# Patient Record
Sex: Male | Born: 1965 | Race: White | Hispanic: No | Marital: Single | Smoking: Former smoker
Health system: Southern US, Community
[De-identification: ages and names within clinical notes are randomized; demographics above are authoritative.]

## PROBLEM LIST (undated history)

## (undated) DIAGNOSIS — E119 Type 2 diabetes mellitus without complications: Secondary | ICD-10-CM

## (undated) DIAGNOSIS — I10 Essential (primary) hypertension: Secondary | ICD-10-CM

## (undated) DIAGNOSIS — I509 Heart failure, unspecified: Secondary | ICD-10-CM

## (undated) DIAGNOSIS — G629 Polyneuropathy, unspecified: Secondary | ICD-10-CM

## (undated) DIAGNOSIS — I2510 Atherosclerotic heart disease of native coronary artery without angina pectoris: Secondary | ICD-10-CM

## (undated) DIAGNOSIS — K219 Gastro-esophageal reflux disease without esophagitis: Secondary | ICD-10-CM

## (undated) HISTORY — DX: Type 2 diabetes mellitus without complications: E11.9

## (undated) HISTORY — DX: Atherosclerotic heart disease of native coronary artery without angina pectoris: I25.10

## (undated) HISTORY — DX: Heart failure, unspecified: I50.9

## (undated) HISTORY — DX: Essential (primary) hypertension: I10

---

## 2022-02-05 ENCOUNTER — Inpatient Hospital Stay
Admission: EM | Admit: 2022-02-05 | Discharge: 2022-03-01 | DRG: 287 | Disposition: A | Discharge status: Home / Self Care | Payer: Medicaid Other | Attending: Osteopathic Medicine | Admitting: Osteopathic Medicine

## 2022-02-05 ENCOUNTER — Inpatient Hospital Stay: Payer: Medicaid Other

## 2022-02-05 ENCOUNTER — Emergency Department: Payer: Medicaid Other

## 2022-02-05 ENCOUNTER — Encounter: Payer: Self-pay | Admitting: Internal Medicine

## 2022-02-05 ENCOUNTER — Other Ambulatory Visit: Payer: Self-pay

## 2022-02-05 DIAGNOSIS — Z597 Insufficient social insurance and welfare support: Secondary | ICD-10-CM

## 2022-02-05 DIAGNOSIS — E1142 Type 2 diabetes mellitus with diabetic polyneuropathy: Secondary | ICD-10-CM | POA: Diagnosis present

## 2022-02-05 DIAGNOSIS — I255 Ischemic cardiomyopathy: Secondary | ICD-10-CM | POA: Diagnosis present

## 2022-02-05 DIAGNOSIS — L97919 Non-pressure chronic ulcer of unspecified part of right lower leg with unspecified severity: Secondary | ICD-10-CM | POA: Diagnosis present

## 2022-02-05 DIAGNOSIS — E876 Hypokalemia: Secondary | ICD-10-CM | POA: Diagnosis not present

## 2022-02-05 DIAGNOSIS — Z8249 Family history of ischemic heart disease and other diseases of the circulatory system: Secondary | ICD-10-CM | POA: Diagnosis not present

## 2022-02-05 DIAGNOSIS — Z6841 Body Mass Index (BMI) 40.0 and over, adult: Secondary | ICD-10-CM | POA: Diagnosis not present

## 2022-02-05 DIAGNOSIS — J9811 Atelectasis: Secondary | ICD-10-CM | POA: Diagnosis present

## 2022-02-05 DIAGNOSIS — I5043 Acute on chronic combined systolic (congestive) and diastolic (congestive) heart failure: Secondary | ICD-10-CM | POA: Diagnosis not present

## 2022-02-05 DIAGNOSIS — F40240 Claustrophobia: Secondary | ICD-10-CM | POA: Diagnosis present

## 2022-02-05 DIAGNOSIS — I959 Hypotension, unspecified: Secondary | ICD-10-CM | POA: Diagnosis not present

## 2022-02-05 DIAGNOSIS — I4892 Unspecified atrial flutter: Secondary | ICD-10-CM | POA: Diagnosis not present

## 2022-02-05 DIAGNOSIS — E1165 Type 2 diabetes mellitus with hyperglycemia: Secondary | ICD-10-CM | POA: Diagnosis present

## 2022-02-05 DIAGNOSIS — E785 Hyperlipidemia, unspecified: Secondary | ICD-10-CM | POA: Diagnosis present

## 2022-02-05 DIAGNOSIS — R778 Other specified abnormalities of plasma proteins: Secondary | ICD-10-CM | POA: Diagnosis not present

## 2022-02-05 DIAGNOSIS — R9431 Abnormal electrocardiogram [ECG] [EKG]: Secondary | ICD-10-CM | POA: Diagnosis not present

## 2022-02-05 DIAGNOSIS — I5021 Acute systolic (congestive) heart failure: Secondary | ICD-10-CM

## 2022-02-05 DIAGNOSIS — R52 Pain, unspecified: Secondary | ICD-10-CM

## 2022-02-05 DIAGNOSIS — I739 Peripheral vascular disease, unspecified: Secondary | ICD-10-CM | POA: Diagnosis present

## 2022-02-05 DIAGNOSIS — I2510 Atherosclerotic heart disease of native coronary artery without angina pectoris: Secondary | ICD-10-CM

## 2022-02-05 DIAGNOSIS — I70202 Unspecified atherosclerosis of native arteries of extremities, left leg: Secondary | ICD-10-CM | POA: Diagnosis present

## 2022-02-05 DIAGNOSIS — E1151 Type 2 diabetes mellitus with diabetic peripheral angiopathy without gangrene: Secondary | ICD-10-CM | POA: Diagnosis present

## 2022-02-05 DIAGNOSIS — I872 Venous insufficiency (chronic) (peripheral): Secondary | ICD-10-CM | POA: Diagnosis present

## 2022-02-05 DIAGNOSIS — I429 Cardiomyopathy, unspecified: Secondary | ICD-10-CM | POA: Diagnosis not present

## 2022-02-05 DIAGNOSIS — L97929 Non-pressure chronic ulcer of unspecified part of left lower leg with unspecified severity: Secondary | ICD-10-CM | POA: Diagnosis not present

## 2022-02-05 DIAGNOSIS — I70249 Atherosclerosis of native arteries of left leg with ulceration of unspecified site: Secondary | ICD-10-CM | POA: Diagnosis not present

## 2022-02-05 DIAGNOSIS — Z79899 Other long term (current) drug therapy: Secondary | ICD-10-CM

## 2022-02-05 DIAGNOSIS — R0902 Hypoxemia: Secondary | ICD-10-CM | POA: Diagnosis not present

## 2022-02-05 DIAGNOSIS — R739 Hyperglycemia, unspecified: Secondary | ICD-10-CM

## 2022-02-05 DIAGNOSIS — I248 Other forms of acute ischemic heart disease: Secondary | ICD-10-CM | POA: Diagnosis not present

## 2022-02-05 DIAGNOSIS — I773 Arterial fibromuscular dysplasia: Secondary | ICD-10-CM | POA: Diagnosis not present

## 2022-02-05 DIAGNOSIS — I509 Heart failure, unspecified: Secondary | ICD-10-CM

## 2022-02-05 DIAGNOSIS — I70239 Atherosclerosis of native arteries of right leg with ulceration of unspecified site: Secondary | ICD-10-CM | POA: Diagnosis not present

## 2022-02-05 DIAGNOSIS — R238 Other skin changes: Secondary | ICD-10-CM | POA: Diagnosis present

## 2022-02-05 DIAGNOSIS — Z7189 Other specified counseling: Secondary | ICD-10-CM | POA: Diagnosis not present

## 2022-02-05 DIAGNOSIS — I25118 Atherosclerotic heart disease of native coronary artery with other forms of angina pectoris: Secondary | ICD-10-CM | POA: Diagnosis not present

## 2022-02-05 DIAGNOSIS — E873 Alkalosis: Secondary | ICD-10-CM | POA: Diagnosis not present

## 2022-02-05 DIAGNOSIS — F17200 Nicotine dependence, unspecified, uncomplicated: Secondary | ICD-10-CM

## 2022-02-05 DIAGNOSIS — I420 Dilated cardiomyopathy: Secondary | ICD-10-CM | POA: Diagnosis not present

## 2022-02-05 DIAGNOSIS — Z87891 Personal history of nicotine dependence: Secondary | ICD-10-CM

## 2022-02-05 DIAGNOSIS — E119 Type 2 diabetes mellitus without complications: Secondary | ICD-10-CM | POA: Diagnosis present

## 2022-02-05 DIAGNOSIS — I110 Hypertensive heart disease with heart failure: Secondary | ICD-10-CM | POA: Diagnosis not present

## 2022-02-05 LAB — CBC WITH DIFFERENTIAL/PLATELET
Abs Immature Granulocytes: 0.02 10*3/uL (ref 0.00–0.07)
Abs Immature Granulocytes: 0.02 10*3/uL (ref 0.00–0.07)
Basophils Absolute: 0.1 10*3/uL (ref 0.0–0.1)
Basophils Absolute: 0 10*3/uL (ref 0.0–0.1)
Basophils Relative: 1 %
Basophils Relative: 1 %
Eosinophils Absolute: 0.2 10*3/uL (ref 0.0–0.5)
Eosinophils Absolute: 0.2 10*3/uL (ref 0.0–0.5)
Eosinophils Relative: 3 %
Eosinophils Relative: 3 %
HCT: 43.2 % (ref 39.0–52.0)
HCT: 41.6 % (ref 39.0–52.0)
Hemoglobin: 13.8 g/dL (ref 13.0–17.0)
Hemoglobin: 13.3 g/dL (ref 13.0–17.0)
Immature Granulocytes: 0 %
Immature Granulocytes: 0 %
Lymphocytes Relative: 7 %
Lymphocytes Relative: 9 %
Lymphs Abs: 0.5 10*3/uL — ABNORMAL LOW (ref 0.7–4.0)
Lymphs Abs: 0.5 10*3/uL — ABNORMAL LOW (ref 0.7–4.0)
MCH: 31.9 pg (ref 26.0–34.0)
MCH: 31 pg (ref 26.0–34.0)
MCHC: 31.9 g/dL (ref 30.0–36.0)
MCHC: 32 g/dL (ref 30.0–36.0)
MCV: 100 fL (ref 80.0–100.0)
MCV: 97 fL (ref 80.0–100.0)
Monocytes Absolute: 0.4 10*3/uL (ref 0.1–1.0)
Monocytes Absolute: 0.4 10*3/uL (ref 0.1–1.0)
Monocytes Relative: 6 %
Monocytes Relative: 6 %
Neutro Abs: 5.9 10*3/uL (ref 1.7–7.7)
Neutro Abs: 5.1 10*3/uL (ref 1.7–7.7)
Neutrophils Relative %: 83 %
Neutrophils Relative %: 81 %
Platelets: 174 10*3/uL (ref 150–400)
Platelets: 165 10*3/uL (ref 150–400)
RBC: 4.32 MIL/uL (ref 4.22–5.81)
RBC: 4.29 MIL/uL (ref 4.22–5.81)
RDW: 16.8 % — ABNORMAL HIGH (ref 11.5–15.5)
RDW: 16.7 % — ABNORMAL HIGH (ref 11.5–15.5)
WBC: 7 10*3/uL (ref 4.0–10.5)
WBC: 6.3 10*3/uL (ref 4.0–10.5)
nRBC: 0 % (ref 0.0–0.2)
nRBC: 0 % (ref 0.0–0.2)

## 2022-02-05 LAB — BRAIN NATRIURETIC PEPTIDE: B Natriuretic Peptide: 919.6 pg/mL — ABNORMAL HIGH (ref 0.0–100.0)

## 2022-02-05 LAB — URINE DRUG SCREEN, QUALITATIVE (ARMC ONLY)
Amphetamines, Ur Screen: NOT DETECTED
Barbiturates, Ur Screen: NOT DETECTED
Benzodiazepine, Ur Scrn: POSITIVE — AB
Cannabinoid 50 Ng, Ur ~~LOC~~: NOT DETECTED
Cocaine Metabolite,Ur ~~LOC~~: NOT DETECTED
MDMA (Ecstasy)Ur Screen: NOT DETECTED
Methadone Scn, Ur: NOT DETECTED
Opiate, Ur Screen: NOT DETECTED
Phencyclidine (PCP) Ur S: NOT DETECTED
Tricyclic, Ur Screen: NOT DETECTED

## 2022-02-05 LAB — COMPREHENSIVE METABOLIC PANEL
ALT: 23 U/L (ref 0–44)
AST: 24 U/L (ref 15–41)
Albumin: 3.4 g/dL — ABNORMAL LOW (ref 3.5–5.0)
Alkaline Phosphatase: 69 U/L (ref 38–126)
Anion gap: 7 (ref 5–15)
BUN: 14 mg/dL (ref 6–20)
CO2: 31 mmol/L (ref 22–32)
Calcium: 8.9 mg/dL (ref 8.9–10.3)
Chloride: 102 mmol/L (ref 98–111)
Creatinine, Ser: 1.01 mg/dL (ref 0.61–1.24)
GFR, Estimated: >60 mL/min (ref >60)
Glucose, Bld: 195 mg/dL — ABNORMAL HIGH (ref 70–99)
Potassium: 3.8 mmol/L (ref 3.5–5.1)
Sodium: 140 mmol/L (ref 135–145)
Total Bilirubin: 0.9 mg/dL (ref 0.3–1.2)
Total Protein: 7.4 g/dL (ref 6.5–8.1)

## 2022-02-05 LAB — BASIC METABOLIC PANEL
Anion gap: 9 (ref 5–15)
BUN: 16 mg/dL (ref 6–20)
CO2: 30 mmol/L (ref 22–32)
Calcium: 8.9 mg/dL (ref 8.9–10.3)
Chloride: 100 mmol/L (ref 98–111)
Creatinine, Ser: 0.96 mg/dL (ref 0.61–1.24)
GFR, Estimated: >60 mL/min (ref >60)
Glucose, Bld: 159 mg/dL — ABNORMAL HIGH (ref 70–99)
Potassium: 4.2 mmol/L (ref 3.5–5.1)
Sodium: 139 mmol/L (ref 135–145)

## 2022-02-05 LAB — MAGNESIUM: Magnesium: 1.8 mg/dL (ref 1.7–2.4)

## 2022-02-05 LAB — TSH: TSH: 9.011 u[IU]/mL — ABNORMAL HIGH (ref 0.350–4.500)

## 2022-02-05 LAB — TROPONIN I (HIGH SENSITIVITY)
Troponin I (High Sensitivity): 370 ng/L (ref <18)
Troponin I (High Sensitivity): 367 ng/L (ref <18)

## 2022-02-05 MED ORDER — ENOXAPARIN SODIUM 80 MG/0.8ML IJ SOSY
0.5000 mg/kg | PREFILLED_SYRINGE | INTRAMUSCULAR | Status: DC
  Administered 2022-02-05 – 2022-02-11 (×7): 72.5 mg via SUBCUTANEOUS
  Filled 2022-02-05 (×5): qty 0.8
  Filled 2022-02-05: qty 0.72
  Filled 2022-02-05: qty 0.8

## 2022-02-05 MED ORDER — ASPIRIN 81 MG PO TBEC
81.0000 mg | DELAYED_RELEASE_TABLET | Freq: Every day | ORAL | Status: DC
  Administered 2022-02-05 – 2022-03-01 (×24): 81 mg via ORAL
  Filled 2022-02-05 (×24): qty 1

## 2022-02-05 MED ORDER — FUROSEMIDE 10 MG/ML IJ SOLN
60.0000 mg | Freq: Two times a day (BID) | INTRAMUSCULAR | Status: DC
  Administered 2022-02-05: 60 mg via INTRAVENOUS
  Filled 2022-02-05: qty 8
  Filled 2022-02-05: qty 6

## 2022-02-05 MED ORDER — FUROSEMIDE 10 MG/ML IJ SOLN
40.0000 mg | Freq: Two times a day (BID) | INTRAMUSCULAR | Status: DC
  Administered 2022-02-05: 40 mg via INTRAVENOUS
  Filled 2022-02-05: qty 4

## 2022-02-05 MED ORDER — NICOTINE POLACRILEX 2 MG MT GUM
2.0000 mg | CHEWING_GUM | Freq: Once | OROMUCOSAL | Status: AC
  Administered 2022-02-06: 2 mg via ORAL
  Filled 2022-02-05: qty 1

## 2022-02-05 MED ORDER — FUROSEMIDE 10 MG/ML IJ SOLN
20.0000 mg | Freq: Once | INTRAMUSCULAR | Status: AC
  Administered 2022-02-05: 20 mg via INTRAVENOUS
  Filled 2022-02-05: qty 4

## 2022-02-05 MED ORDER — OXYCODONE-ACETAMINOPHEN 5-325 MG PO TABS
1.0000 | ORAL_TABLET | Freq: Once | ORAL | Status: AC
  Administered 2022-02-05: 1 via ORAL
  Filled 2022-02-05: qty 1

## 2022-02-05 NOTE — ED Notes (Signed)
Troponin 370 per lab.  EDP [place] notified.

## 2022-02-05 NOTE — ED Provider Notes (Signed)
[facility] Provider Note    Event Date/Time   First MD Initiated Contact with Patient 02/05/22 0207     (approximate)   History   Leg Swelling   HPI  Lawrence Murray is a 56 y.o. male who has not seen a physician since he was a teenager who presents for evaluation of bilateral leg swelling, shortness of breath and abdominal distention.  Patient reports that he has health has been getting progressively worse since November 2022.  He has had progressively worsening swelling of both legs with blisters and oozing of fluid from them.  Progressively worsening which is now severe distention of his abdomen.  Has had orthopnea.  Over the last several months patient has been sleeping leaning forward on his chair due to the severity of his shortness of breath.  Patient's son has been begging him to come and get checked out which is the reason why he is here today.  He reports that when he cannot breathe he has heaviness in his chest but denies any current chest pain.  He has a family history of congestive heart failure    PMH None  Physical Exam   Triage Vital Signs: ED Triage Vitals  Enc Vitals Group     BP 02/05/22 0214 (!) 143/89     Pulse Rate 02/05/22 0214 (!) 117     Resp 02/05/22 0214 (!) 21     Temp 02/05/22 0214 98.3 F (36.8 C)     Temp Source 02/05/22 0214 Oral     SpO2 02/05/22 0214 96 %     Weight 02/05/22 0216 (!) 320 lb (145.2 kg)     Height 02/05/22 0216 '5\' 10"'$  (1.778 m)     Head Circumference --      Peak Flow --      Pain Score 02/05/22 0314 5     Pain Loc --      Pain Edu? --      Excl. in [place]? --     Most recent vital signs: Vitals:   02/05/22 0230 02/05/22 0314  BP: 120/87 109/75  Pulse: (!) 110 (!) 107  Resp: (!) 24 20  Temp:    SpO2: 97% 97%     Constitutional: Alert and oriented.  HEENT:      Head: Normocephalic and atraumatic.         Eyes: Conjunctivae are normal. Sclera is non-icteric.       Mouth/Throat: Mucous  membranes are moist.       Neck: Supple with no signs of meningismus. Cardiovascular: Regular rhythm with tachycardic rate respiratory: Tachypneic but no hypoxic with crackles bilateral Gastrointestinal: Severely distended abdomen with no tenderness  musculoskeletal: Severe pitting edema bilaterally with blisters, feet are cool to the touch with no erythema or warmth Neurologic: Normal speech and language. Face is symmetric. Moving all extremities. No gross focal neurologic deficits are appreciated. Skin: Skin is warm, dry and intact. No rash noted. Psychiatric: Mood and affect are normal. Speech and behavior are normal.  ED Results / Procedures / Treatments   Labs (all labs ordered are listed, but only abnormal results are displayed) Labs Reviewed  CBC WITH DIFFERENTIAL/PLATELET - Abnormal; Notable for the following components:      Result Value   RDW 16.8 (*)    Lymphs Abs 0.5 (*)    All other components within normal limits  COMPREHENSIVE METABOLIC PANEL - Abnormal; Notable for the following components:   Glucose, Bld 195 (*)  Albumin 3.4 (*)    All other components within normal limits  BRAIN NATRIURETIC PEPTIDE - Abnormal; Notable for the following components:   B Natriuretic Peptide 919.6 (*)    All other components within normal limits  TROPONIN I (HIGH SENSITIVITY) - Abnormal; Notable for the following components:   Troponin I (High Sensitivity) 370 (*)    All other components within normal limits     EKG  ED ECG REPORT I, Rudene Re, the attending physician, personally viewed and interpreted this ECG.  Atrial flutter with a rate of 114, no ST elevations or depressions.  RADIOLOGY I, Rudene Re, attending MD, have personally viewed and interpreted the images obtained during this visit as below:  X-ray shows pleural effusion and edema   ___________________________________________________ Interpretation by Radiologist:  DG Chest Portable 1  View  Result Date: 02/05/2022 CLINICAL DATA:  Shortness of breath. EXAM: PORTABLE CHEST 1 VIEW COMPARISON:  None Available. FINDINGS: Small bilateral pleural effusions with bibasilar atelectasis or infiltrate. No pneumothorax. Top-normal cardiac size. No acute osseous pathology. IMPRESSION: Small bilateral pleural effusions with bibasilar atelectasis or infiltrate. Electronically Signed   By: Anner Crete M.D.   On: 02/05/2022 02:55       PROCEDURES:  Critical Care performed: Yes, see critical care procedure note(s)  .Critical Care  Performed by: Rudene Re, MD Authorized by: Rudene Re, MD   Critical care provider statement:    Critical care time (minutes):  40   Critical care time was exclusive of:  Separately billable procedures and treating other patients   Critical care was necessary to treat or prevent imminent or life-threatening deterioration of the following conditions:  Respiratory failure, circulatory failure, cardiac failure and shock   Critical care was time spent personally by me on the following activities:  Development of treatment plan with patient or surrogate, discussions with consultants, evaluation of patient's response to treatment, examination of patient, ordering and review of laboratory studies, ordering and review of radiographic studies, ordering and performing treatments and interventions, pulse oximetry, re-evaluation of patient's condition and review of old charts   I assumed direction of critical care for this patient from another provider in my specialty: no     Care discussed with: admitting provider       IMPRESSION / MDM / [place] / ED COURSE  I reviewed the triage vital signs and the nursing notes.  56 y.o. male who has not seen a physician since he was a teenager who presents for evaluation of bilateral leg swelling, shortness of breath and abdominal distention.  Patient arrives severely volume overloaded with bilateral  crackles, severely abdominal distention, severe bilateral pitting edema.  While sitting up patient is not requiring any oxygen  Ddx: Congestive heart failure versus kidney disease versus cardiorenal syndrome versus cirrhosis    Plan: EKG, troponin, BNP, BMP, chest x-ray.  Patient placed on telemetry for monitoring of cardiorespiratory status   MEDICATIONS GIVEN IN ED: Medications  oxyCODONE-acetaminophen (PERCOCET/ROXICET) 5-325 MG per tablet 1 tablet (has no administration in time range)  furosemide (LASIX) injection 20 mg (20 mg Intravenous Given 02/05/22 0316)     ED COURSE: Presentation concerning for congestive heart failure with pleural effusions, pulmonary edema, BNP of 919.  Patient was started on IV Lasix.  Troponin of 370 which is expected since patient symptoms have been ongoing since November.  There is no signs of ischemia and he denies any chest pain at this time.  This is most likely demand.  Kidney  function is normal.  Patient also has a glucose of 195 most likely from undiagnosed diabetes.  EKG showing atrial flutter with a rate of 114.  Hospitalist service was consulted and after discussion have accepted patient to their service.   Consults: Hospitalist   EMR reviewed none    FINAL CLINICAL IMPRESSION(S) / ED DIAGNOSES   Final diagnoses:  New onset of congestive heart failure ([place])  Demand ischemia ([place])  Atrial flutter, unspecified type ([place])  Hyperglycemia     Rx / DC Orders   ED Discharge Orders     None        Note:  This document was prepared using Dragon voice recognition software and may include unintentional dictation errors.   Please note:  Patient was evaluated in Emergency Department today for the symptoms described in the history of present illness. Patient was evaluated in the context of the global COVID-19 pandemic, which necessitated consideration that the patient might be at risk for infection with the SARS-CoV-2 virus that causes  COVID-19. Institutional protocols and algorithms that pertain to the evaluation of patients at risk for COVID-19 are in a state of rapid change based on information released by regulatory bodies including the CDC and federal and state organizations. These policies and algorithms were followed during the patient's care in the ED.  Some ED evaluations and interventions may be delayed as a result of limited staffing during the pandemic.       Alfred Levins, [place], MD 02/05/22 [phone]

## 2022-02-05 NOTE — ED Triage Notes (Signed)
To room 5 via [facility] from home with c/o Edema and weeping blisters to bilateral legs and abd. Abdominal distention. Ongoing since November, pt states blisters became worse today. Pt reports not seeking medical care for years despite condition; Denies chest pain. States he sleeps upright in a chair, leaning on another chair in order to breath adequately for sleep.  Pt has difficulty being detailed in specifics regarding sx and onset/timeframe.

## 2022-02-05 NOTE — Progress Notes (Signed)
Brief hospitalist update note.  This is a nonbillable note.  Please see same-day H&P for full billable details.  Briefly, this is a 56 year old male who has not seen a doctor in many years who presents for progressive shortness of breath associated with lower extremity edema.  Bilateral lower extremities are markedly edematous, cold to touch but with dopplerable pulses.  Presentation is consistent with marked fluid overload.  Work-up in progress.  Diuresing aggressively.  Ensure net negative fluid status.  WOC consult for bilateral lower extremity wounds.  Low suspicion for infectious process. Remainder of care per HPI  Ralene Muskrat MD  No charge

## 2022-02-05 NOTE — ED Notes (Signed)
RN attempted to give pt IV lasix. RN flushed IV prior and IV blew.

## 2022-02-05 NOTE — H&P (Signed)
History and Physical    Lawrence Murray HAL:[id number] DOB: 15-Jun-1966 DOA: 02/05/2022  PCP: Pcp, No  Patient coming from: Home.  Chief Complaint: Shortness of breath.  HPI: Lawrence Murray is a 56 y.o. male with no significant past medical history who has not been to a physician since childhood presents to the ER because of increasing peripheral edema shortness of breath.  At times patient also has chest pressure.  Patient states his month since he laid flat on the bed.  He has been having so much edema that he has started blistering in his lower extremities.  Denies any fever chills productive cough.  ED Course: In the ER patient was tachycardic.  On exam patient has significant peripheral edema blistering of the lower extremities skin and cool peripheries but has pulses.  Labs show elevated high sensitive troponin but flat and BNP of 900.  Chest x-ray shows features concerning for CHF.  Patient is tachycardic and concern for atrial flutter.  Patient was given Lasix admitted for acute CHF new onset.  Review of Systems: As per HPI, rest all negative.   History reviewed. No pertinent past medical history.  History reviewed. No pertinent surgical history.   reports that he has quit smoking. His smoking use included cigarettes. He has never used smokeless tobacco. He reports that he does not drink alcohol and does not use drugs.  Not on File  Family History  Problem Relation Age of Onset   Congestive Heart Failure Father     Prior to Admission medications   Not on File    Physical Exam: Constitutional: Moderately built and nourished. Vitals:   02/05/22 0214 02/05/22 0216 02/05/22 0230 02/05/22 0314  BP: (!) 143/89  120/87 109/75  Pulse: (!) 117  (!) 110 (!) 107  Resp: (!) 21  (!) 24 20  Temp: 98.3 F (36.8 C)     TempSrc: Oral     SpO2: 96%  97% 97%  Weight:  (!) 145.2 kg    Height:  '5\' 10"'$  (1.778 m)     Eyes: Anicteric no pallor. ENMT: No discharge from the ears eyes nose  and mouth. Neck: No mass felt.  No neck rigidity. Respiratory: No rhonchi or crepitations. Cardiovascular: S1-S2 heard. Abdomen: Soft nontender bowel sound present. Musculoskeletal: Bilateral lower extremity edema present.  Pulses are dopplerable. Skin: Erythema and blistering of the skin. Neurologic: Alert awake oriented time place and person.  Moves all extremities. Psychiatric: Appears normal.  Normal affect.   Labs on Admission: I have personally reviewed following labs and imaging studies  CBC: Recent Labs  Lab 02/05/22 0218  WBC 7.0  NEUTROABS 5.9  HGB 13.8  HCT 43.2  MCV 100.0  PLT 735   Basic Metabolic Panel: Recent Labs  Lab 02/05/22 0218  NA 140  K 3.8  CL 102  CO2 31  GLUCOSE 195*  BUN 14  CREATININE 1.01  CALCIUM 8.9   GFR: Estimated Creatinine Clearance: 120.5 mL/min (by C-G formula based on SCr of 1.01 mg/dL). Liver Function Tests: Recent Labs  Lab 02/05/22 0218  AST 24  ALT 23  ALKPHOS 69  BILITOT 0.9  PROT 7.4  ALBUMIN 3.4*   No results for input(s): "LIPASE", "AMYLASE" in the last 168 hours. No results for input(s): "AMMONIA" in the last 168 hours. Coagulation Profile: No results for input(s): "INR", "PROTIME" in the last 168 hours. Cardiac Enzymes: No results for input(s): "CKTOTAL", "CKMB", "CKMBINDEX", "TROPONINI" in the last 168 hours. BNP (last 3 results) No  results for input(s): "PROBNP" in the last 8760 hours. HbA1C: No results for input(s): "HGBA1C" in the last 72 hours. CBG: No results for input(s): "GLUCAP" in the last 168 hours. Lipid Profile: No results for input(s): "CHOL", "HDL", "LDLCALC", "TRIG", "CHOLHDL", "LDLDIRECT" in the last 72 hours. Thyroid Function Tests: No results for input(s): "TSH", "T4TOTAL", "FREET4", "T3FREE", "THYROIDAB" in the last 72 hours. Anemia Panel: No results for input(s): "VITAMINB12", "FOLATE", "FERRITIN", "TIBC", "IRON", "RETICCTPCT" in the last 72 hours. Urine analysis: No results found  for: "COLORURINE", "APPEARANCEUR", "LABSPEC", "PHURINE", "GLUCOSEU", "HGBUR", "BILIRUBINUR", "KETONESUR", "PROTEINUR", "UROBILINOGEN", "NITRITE", "LEUKOCYTESUR" Sepsis Labs: '@LABRCNTIP'$ (procalcitonin:4,lacticidven:4) )No results found for this or any previous visit (from the past 240 hour(s)).   Radiological Exams on Admission: DG Chest Portable 1 View  Result Date: 02/05/2022 CLINICAL DATA:  Shortness of breath. EXAM: PORTABLE CHEST 1 VIEW COMPARISON:  None Available. FINDINGS: Small bilateral pleural effusions with bibasilar atelectasis or infiltrate. No pneumothorax. Top-normal cardiac size. No acute osseous pathology. IMPRESSION: Small bilateral pleural effusions with bibasilar atelectasis or infiltrate. Electronically Signed   By: Anner Crete M.D.   On: 02/05/2022 02:55    EKG: Independently reviewed.  Tachycardia.  Rhythm not clear.  Assessment/Plan Principal Problem:   Acute CHF (congestive heart failure) (HCC) Active Problems:   Hyperglycemia    Acute CHF -patient's symptoms are concerning for CHF.  Will check 2D echo to assess LV.  We will keep patient on Lasix 40 mg IV every 12 may need higher dose based on response.  Closely follow intake output metabolic panel daily weights. Elevated troponin likely from CHF.  Remains flat.  Follow 2D echo we will keep patient on aspirin. Hyperglycemia we will check hemoglobin A1c. Tachycardia with concern for atrial flutter.  We will repeat another EKG to assess the rhythm.  Monitor in telemetry. Bilateral lower extremity erythema and blistering of the skin likely from edema.  Patient does have cool peripheries but pulses are dopplerable.  We will check venous Dopplers and ABI.  Wound team consult.  Since patient has acute CHF new onset will need close monitoring inpatient status.   DVT prophylaxis: Lovenox. Code Status: Full code. Family Communication: Discussed with patient. Disposition Plan: Home. Consults called: None. Admission  status: Inpatient.   Rise Patience MD [facility] Pager [phone].  If 7PM-7AM, please contact night-coverage [email or web address] Password [facility]  02/05/2022, 6:39 AM

## 2022-02-05 NOTE — ED Notes (Signed)
Pt at [facility]

## 2022-02-05 NOTE — Progress Notes (Signed)
       CROSS COVER NOTE  NAME: Lawrence Murray MRN: [id number] DOB : 1966-05-21   Lawrence Murray is requesting medication for anxiety. He reports he has anxiety attacks that have been ongoing since his teenage years. He does not receive routine outpatient care and has been self medicating with Xanax obtained off the street. He is unable to tell me the dose but says he takes 0.5-1 "blues" a day. He also reports difficulty sleeping and typically gets only 2 hours of sleep at a time at home. Lawrence Murray is also requesting nicotine replacement but does not want to use the patch.   Plan:  -Seroquel - Nicotine gum   This document was prepared using Dragon voice recognition software and may include unintentional dictation errors.  Neomia Glass DNP, MHA, FNP-BC Nurse Practitioner [facility] Pager [phone]

## 2022-02-05 NOTE — Consult Note (Signed)
WOC Nurse Consult Note: Patient receiving care in [facility] ED5. Reason for Consult: LE wound Wound type: fluid overload, weeping legs. Pressure Injury POA: Yes/No/NA Measurement:na Wound bed: erythematous, weeping BLEs with crusting. Drainage (amount, consistency, odor) serous fluid from both legs Periwound: macerated Dressing procedure/placement/frequency: Wash BLEs with soap and water, pat dry. Place as many Xeroform gauzes Kellie Simmering [phone]) as needed to cover wounds/weeping areas of BLEs. Then top with ABD pads. Beginning behind the toes and going to just below the knees, spiral wrap kerlix, then 4 inch ace wrap Kellie Simmering [phone]). Perform daily.   ABI results are pending. Once those are obtained we may be able to start compression wraps on Tuesday. I will follow up tomorrow. Val Riles, RN, MSN, CWOCN, CNS-BC, pager [phone]

## 2022-02-05 NOTE — Progress Notes (Signed)
PHARMACIST - PHYSICIAN COMMUNICATION  CONCERNING:  Enoxaparin (Lovenox) for DVT Prophylaxis    RECOMMENDATION: Patient was prescribed enoxaprin '40mg'$  q24 hours for VTE prophylaxis.   Filed Weights   02/05/22 0216  Weight: (!) 145.2 kg (320 lb)    Body mass index is 45.92 kg/m.  Estimated Creatinine Clearance: 120.5 mL/min (by C-G formula based on SCr of 1.01 mg/dL).   Based on [place] patient is candidate for enoxaparin 0.'5mg'$ /kg TBW SQ every 24 hours based on BMI being >30.  DESCRIPTION: Pharmacy has adjusted enoxaparin dose per [facility] policy.  Patient is now receiving enoxaparin 0.5 mg/kg every 24 hours   Renda Rolls, PharmD, [facility] 02/05/2022 6:40 AM

## 2022-02-05 NOTE — ED Notes (Signed)
Pt back from US

## 2022-02-06 ENCOUNTER — Inpatient Hospital Stay
Admit: 2022-02-06 | Discharge: 2022-02-06 | Disposition: A | Discharge status: Home / Self Care | Payer: Medicaid Other | Attending: Internal Medicine | Admitting: Internal Medicine

## 2022-02-06 DIAGNOSIS — I5043 Acute on chronic combined systolic (congestive) and diastolic (congestive) heart failure: Secondary | ICD-10-CM | POA: Diagnosis not present

## 2022-02-06 DIAGNOSIS — I5021 Acute systolic (congestive) heart failure: Secondary | ICD-10-CM | POA: Diagnosis not present

## 2022-02-06 DIAGNOSIS — I509 Heart failure, unspecified: Secondary | ICD-10-CM | POA: Diagnosis not present

## 2022-02-06 LAB — BASIC METABOLIC PANEL
Anion gap: 8 (ref 5–15)
BUN: 14 mg/dL (ref 6–20)
CO2: 34 mmol/L — ABNORMAL HIGH (ref 22–32)
Calcium: 8.8 mg/dL — ABNORMAL LOW (ref 8.9–10.3)
Chloride: 99 mmol/L (ref 98–111)
Creatinine, Ser: 0.92 mg/dL (ref 0.61–1.24)
GFR, Estimated: >60 mL/min (ref >60)
Glucose, Bld: 200 mg/dL — ABNORMAL HIGH (ref 70–99)
Potassium: 4.1 mmol/L (ref 3.5–5.1)
Sodium: 141 mmol/L (ref 135–145)

## 2022-02-06 LAB — ECHOCARDIOGRAM COMPLETE
Height: 70 in
S' Lateral: 4 cm
Weight: 5120 oz

## 2022-02-06 LAB — GLUCOSE, CAPILLARY
Glucose-Capillary: 202 mg/dL — ABNORMAL HIGH (ref 70–99)
Glucose-Capillary: 109 mg/dL — ABNORMAL HIGH (ref 70–99)
Glucose-Capillary: 194 mg/dL — ABNORMAL HIGH (ref 70–99)

## 2022-02-06 LAB — HEMOGLOBIN A1C
Hgb A1c MFr Bld: 7.7 % — ABNORMAL HIGH (ref 4.8–5.6)
Mean Plasma Glucose: 174.29 mg/dL

## 2022-02-06 LAB — HIV ANTIBODY (ROUTINE TESTING W REFLEX): HIV Screen 4th Generation wRfx: NONREACTIVE

## 2022-02-06 LAB — T4, FREE: Free T4: 0.92 ng/dL (ref 0.61–1.12)

## 2022-02-06 MED ORDER — QUETIAPINE FUMARATE 25 MG PO TABS
25.0000 mg | ORAL_TABLET | Freq: Once | ORAL | Status: AC
Start: 2022-02-06 — End: 2022-02-06
  Administered 2022-02-06: 25 mg via ORAL
  Filled 2022-02-06: qty 1

## 2022-02-06 MED ORDER — FUROSEMIDE 10 MG/ML IJ SOLN
40.0000 mg | Freq: Two times a day (BID) | INTRAMUSCULAR | Status: DC
  Administered 2022-02-06 – 2022-02-07 (×2): 40 mg via INTRAVENOUS
  Filled 2022-02-06 (×2): qty 4

## 2022-02-06 MED ORDER — ALPRAZOLAM 0.25 MG PO TABS
0.2500 mg | ORAL_TABLET | Freq: Three times a day (TID) | ORAL | Status: DC | PRN
Start: 2022-02-06 — End: 2022-02-18
  Administered 2022-02-06 – 2022-02-18 (×28): 0.25 mg via ORAL
  Filled 2022-02-06 (×29): qty 1

## 2022-02-06 MED ORDER — INSULIN ASPART 100 UNIT/ML IJ SOLN
0.0000 [IU] | Freq: Every day | INTRAMUSCULAR | Status: DC
  Administered 2022-02-15: 4 [IU] via SUBCUTANEOUS
  Administered 2022-02-16: 3 [IU] via SUBCUTANEOUS
  Administered 2022-02-17: 2 [IU] via SUBCUTANEOUS
  Administered 2022-02-18: 3 [IU] via SUBCUTANEOUS
  Administered 2022-02-19: 2 [IU] via SUBCUTANEOUS
  Administered 2022-02-21 – 2022-02-22 (×2): 3 [IU] via SUBCUTANEOUS
  Administered 2022-02-26 – 2022-02-28 (×3): 2 [IU] via SUBCUTANEOUS
  Filled 2022-02-06 (×10): qty 1

## 2022-02-06 MED ORDER — INSULIN ASPART 100 UNIT/ML IJ SOLN
0.0000 [IU] | Freq: Three times a day (TID) | INTRAMUSCULAR | Status: DC
  Administered 2022-02-06: 5 [IU] via SUBCUTANEOUS
  Administered 2022-02-07: 8 [IU] via SUBCUTANEOUS
  Administered 2022-02-07 – 2022-02-09 (×6): 3 [IU] via SUBCUTANEOUS
  Administered 2022-02-09 – 2022-02-10 (×2): 2 [IU] via SUBCUTANEOUS
  Administered 2022-02-10 – 2022-02-11 (×3): 3 [IU] via SUBCUTANEOUS
  Administered 2022-02-11 – 2022-02-12 (×4): 2 [IU] via SUBCUTANEOUS
  Administered 2022-02-13: 3 [IU] via SUBCUTANEOUS
  Administered 2022-02-13 (×2): 2 [IU] via SUBCUTANEOUS
  Administered 2022-02-14: 5 [IU] via SUBCUTANEOUS
  Administered 2022-02-14: 2 [IU] via SUBCUTANEOUS
  Administered 2022-02-15: 3 [IU] via SUBCUTANEOUS
  Administered 2022-02-15: 5 [IU] via SUBCUTANEOUS
  Administered 2022-02-15 – 2022-02-16 (×2): 3 [IU] via SUBCUTANEOUS
  Administered 2022-02-16 (×2): 5 [IU] via SUBCUTANEOUS
  Administered 2022-02-17: 2 [IU] via SUBCUTANEOUS
  Administered 2022-02-17 (×2): 3 [IU] via SUBCUTANEOUS
  Administered 2022-02-18: 11 [IU] via SUBCUTANEOUS
  Administered 2022-02-18: 3 [IU] via SUBCUTANEOUS
  Administered 2022-02-18: 2 [IU] via SUBCUTANEOUS
  Administered 2022-02-19: 5 [IU] via SUBCUTANEOUS
  Administered 2022-02-19 – 2022-02-20 (×3): 3 [IU] via SUBCUTANEOUS
  Administered 2022-02-20: 5 [IU] via SUBCUTANEOUS
  Administered 2022-02-20 – 2022-02-23 (×7): 3 [IU] via SUBCUTANEOUS
  Administered 2022-02-23: 8 [IU] via SUBCUTANEOUS
  Administered 2022-02-23: 3 [IU] via SUBCUTANEOUS
  Administered 2022-02-24: 5 [IU] via SUBCUTANEOUS
  Administered 2022-02-24 – 2022-02-25 (×3): 3 [IU] via SUBCUTANEOUS
  Administered 2022-02-25: 5 [IU] via SUBCUTANEOUS
  Administered 2022-02-25 – 2022-02-26 (×3): 3 [IU] via SUBCUTANEOUS
  Administered 2022-02-26: 2 [IU] via SUBCUTANEOUS
  Administered 2022-02-27: 5 [IU] via SUBCUTANEOUS
  Administered 2022-02-27: 3 [IU] via SUBCUTANEOUS
  Administered 2022-02-27: 5 [IU] via SUBCUTANEOUS
  Administered 2022-02-28 (×3): 3 [IU] via SUBCUTANEOUS
  Administered 2022-03-01: 2 [IU] via SUBCUTANEOUS
  Filled 2022-02-06 (×65): qty 1

## 2022-02-06 MED ORDER — FUROSEMIDE 10 MG/ML IJ SOLN
60.0000 mg | Freq: Once | INTRAMUSCULAR | Status: AC
  Administered 2022-02-06: 60 mg via INTRAVENOUS

## 2022-02-06 MED ORDER — MAGNESIUM SULFATE 2 GM/50ML IV SOLN
2.0000 g | Freq: Once | INTRAVENOUS | Status: AC
  Administered 2022-02-06: 2 g via INTRAVENOUS
  Filled 2022-02-06: qty 50

## 2022-02-06 NOTE — Final Consult Note (Signed)
[place] Nurse wound follow up Patient receiving care in [facility]. The patient was seen yesterday and gentle compression with ace wraps was instituted. Apparently there was confusion related to a possible ABI order. I do not see results from an ABI. I will sign the [place] team off for now. Please continue the outlined wound care. Please reconsult if needed. [place] nurse will not follow at this time.  Please re-consult the [place] team if needed.  Val Riles, RN, MSN, CWOCN, CNS-BC, pager [phone]

## 2022-02-06 NOTE — Consult Note (Signed)
  Heart Failure Nurse Navigator Note  HF-echocardiogram results are pending at this time.  He presented to the emergency room with complaints of increased peripheral edema, shortness of breath, PND, orthopnea and severe abdominal distention.  Comorbidities:  Morbid obesity Has not followed regularly with a physician since he was a teen.  Medications:  Aspirin 81 mg daily Magnesium sulfate IV Lasix 40 mg IV every 12 hours  Labs:  Sodium 141, potassium 4.1, chloride 99, CO2 34, BUN 14, creatinine 0.92, GFR greater than 60. Weight is 145.2 kg Blood pressure 111/74 Intake not documented Output 3800 mL  Initial meeting with patient.  He was sitting up in a recliner at bedside.  He states that he is not able to lie back in the recliner as he becomes short of breath as he did at home.  He states that he lives with his father and is his main care giver.  He states at one time his father was in hospice but that is since been rescinded.  He states that his father has congestive heart failure and coronary artery disease.  His 55 year old son also lives with him.  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  He is unemployed.  He states that he has no money for medications nor does he have any insurance.  Attempted to explain the medication management clinic and what would be required from him on his part to be able to get the meds from the clinic.  Needs to be directed several times throughout the interview.  Went over what heart failure means, sticking with a low-sodium diet and fluid restriction.  Also discussed follow-up in the outpatient heart failure clinic.  He has an appointment on July 11 at 8:30 in the morning.  He was given the living with heart failure teaching booklet, zone magnet, info on low-sodium and what heart failure is.  Along with the weight chart.  Will continue to  follow.  Pricilla Riffle RN CHFN

## 2022-02-06 NOTE — Plan of Care (Signed)

## 2022-02-06 NOTE — Progress Notes (Signed)
PROGRESS NOTE    Lawrence Murray  NLG:[id number] DOB: Nov 21, 1965 DOA: 02/05/2022 PCP: Pcp, No    Brief Narrative:  56 year old male who has not seen a doctor in many years who presents for progressive shortness of breath associated with lower extremity edema.  Bilateral lower extremities are markedly edematous, cold to touch but with dopplerable pulses.  Presentation is consistent with marked fluid overload.  Work-up in progress.   Assessment & Plan:   Principal Problem:   Acute CHF (congestive heart failure) (HCC) Active Problems:   Hyperglycemia  Fluid overload/anasarca Suspect significant fluid overload secondary to decompensated heart failure Patient does not see doctors or take any medications Volume status improved after initiation of IV diuresis Plan: Continue Lasix, dose decreased to 40 mg IV twice daily Monitor volume status carefully Strict ins and outs Daily weights 2D echocardiogram Target net -1-1.5 L daily Heart healthy diet, fluid restrict 1200 cc daily Daily BMP, replace potassium and magnesium aggressively Consider inpatient cardiology evaluation once echocardiogram results are known  Elevated troponin No significant delta Suspect secondary to decompensated CHF Telemetry Follow-up 2D echocardiogram  Bilateral lower extremity edema Bilateral lower extremity markedly edematous with blistering Feet cold to touch Positive dopplerable pulses ABI unable to interpret Plan: Patient may need vascular evaluation for arterial insufficiency however suspect massive fluid overload is primary driver at this point We will attempt to optimize fluid status is much as possible Diurese aggressively Keep lower extremities wrapped Once volume status improves suggest CT angiogram lower extremities and consideration for inpatient vascular consultation  Morbid obesity BMI 46 This complicates overall care and prognosis RD consultation  Hyperglycemia Hemoglobin A1c 7.7,  indicating diabetes, suspect type II No previous history Plan: Moderate SSI Fingersticks before meals and at bedtime  Tachycardia Appears resolved Initial concern for atrial fibrillation/flutter We will keep on telemetry monitoring  Tobacco abuse Counseled patient Nicotine gum  Anxiety As needed Xanax   DVT prophylaxis: SQ Lovenox Code Status: Full Family Communication: Mother at bedside 6/19 Disposition Plan: Status is: Inpatient Remains inpatient appropriate because: Fluid overload/anasarca.  Suspect decompensated CHF.  On diuresis.  Echocardiogram pending.   Level of care: Telemetry Cardiac  Consultants:  None   Procedures:  None  Antimicrobials: None   Subjective: Seen and examined.  Endorses anxiety.  No pain complaints.  Objective: Vitals:   02/05/22 2131 02/05/22 2326 02/06/22 0500 02/06/22 0508  BP: (!) 121/94 104/68  111/74  Pulse: 99 97  93  Resp: '18 18  18  '$ Temp: 98.4 F (36.9 C) 98.2 F (36.8 C)  97.7 F (36.5 C)  TempSrc:      SpO2: 96% 94%  98%  Weight:   (!) 145.2 kg   Height:        Intake/Output Summary (Last 24 hours) at 02/06/2022 1023 Last data filed at 02/06/2022 0200 Gross per 24 hour  Intake --  Output 2600 ml  Net -2600 ml   Filed Weights   02/05/22 0216 02/06/22 0500  Weight: (!) 145.2 kg (!) 145.2 kg    Examination:  General exam: No acute distress.  Appears chronically ill Respiratory system: Bilateral scattered crackles.  Normal work of breathing.  Room air Cardiovascular system: S1-S2, RRR, no murmurs, marked pedal edema Gastrointestinal system: Obese, NT/ND, normal bowel sounds Central nervous system: Alert and oriented. No focal neurological deficits. Extremities: Symmetric 5 x 5 power. Skin: No rashes, lesions or ulcers.  Bilateral lower extremity edema.  Feet cold to touch Psychiatry: Judgement and insight appear normal. Mood &  affect appropriate.     Data Reviewed: I have personally reviewed following  labs and imaging studies  CBC: Recent Labs  Lab 02/05/22 0218 02/05/22 2153  WBC 7.0 6.3  NEUTROABS 5.9 5.1  HGB 13.8 13.3  HCT 43.2 41.6  MCV 100.0 97.0  PLT 174 034   Basic Metabolic Panel: Recent Labs  Lab 02/05/22 0218 02/05/22 2153  NA 140 139  K 3.8 4.2  CL 102 100  CO2 31 30  GLUCOSE 195* 159*  BUN 14 16  CREATININE 1.01 0.96  CALCIUM 8.9 8.9  MG  --  1.8   GFR: Estimated Creatinine Clearance: 126.8 mL/min (by C-G formula based on SCr of 0.96 mg/dL). Liver Function Tests: Recent Labs  Lab 02/05/22 0218  AST 24  ALT 23  ALKPHOS 69  BILITOT 0.9  PROT 7.4  ALBUMIN 3.4*   No results for input(s): "LIPASE", "AMYLASE" in the last 168 hours. No results for input(s): "AMMONIA" in the last 168 hours. Coagulation Profile: No results for input(s): "INR", "PROTIME" in the last 168 hours. Cardiac Enzymes: No results for input(s): "CKTOTAL", "CKMB", "CKMBINDEX", "TROPONINI" in the last 168 hours. BNP (last 3 results) No results for input(s): "PROBNP" in the last 8760 hours. HbA1C: Recent Labs    02/05/22 2153  HGBA1C 7.7*   CBG: No results for input(s): "GLUCAP" in the last 168 hours. Lipid Profile: No results for input(s): "CHOL", "HDL", "LDLCALC", "TRIG", "CHOLHDL", "LDLDIRECT" in the last 72 hours. Thyroid Function Tests: Recent Labs    02/05/22 2153  TSH 9.011*  FREET4 0.92   Anemia Panel: No results for input(s): "VITAMINB12", "FOLATE", "FERRITIN", "TIBC", "IRON", "RETICCTPCT" in the last 72 hours. Sepsis Labs: No results for input(s): "PROCALCITON", "LATICACIDVEN" in the last 168 hours.  No results found for this or any previous visit (from the past 240 hour(s)).       Radiology Studies: US Venous Img Lower Bilateral (DVT)  Result Date: 02/05/2022 CLINICAL DATA:  Pain and swelling EXAM: BILATERAL LOWER EXTREMITY VENOUS DOPPLER ULTRASOUND TECHNIQUE: Gray-scale sonography with graded compression, as well as color Doppler and duplex  ultrasound were performed to evaluate the lower extremity deep venous systems from the level of the common femoral vein and including the common femoral, femoral, profunda femoral, popliteal and calf veins including the posterior tibial, peroneal and gastrocnemius veins when visible. The superficial great saphenous vein was also interrogated. Spectral Doppler was utilized to evaluate flow at rest and with distal augmentation maneuvers in the common femoral, femoral and popliteal veins. COMPARISON:  None Available. FINDINGS: RIGHT LOWER EXTREMITY Common Femoral Vein: No evidence of thrombus. Normal compressibility, respiratory phasicity and response to augmentation. Saphenofemoral Junction: No evidence of thrombus. Normal compressibility and flow on color Doppler imaging. Profunda Femoral Vein: No evidence of thrombus. Normal compressibility and flow on color Doppler imaging. Femoral Vein: No evidence of thrombus. Normal compressibility, respiratory phasicity and response to augmentation. Popliteal Vein: No evidence of thrombus. Normal compressibility, respiratory phasicity and response to augmentation. Calf Veins: No evidence of thrombus. Normal compressibility and flow on color Doppler imaging. Superficial Great Saphenous Vein: No evidence of thrombus. Normal compressibility. Venous Reflux:  None. Other Findings:  There is edema in the subcutaneous plane. LEFT LOWER EXTREMITY Common Femoral Vein: No evidence of thrombus. Normal compressibility, respiratory phasicity and response to augmentation. Saphenofemoral Junction: No evidence of thrombus. Normal compressibility and flow on color Doppler imaging. Profunda Femoral Vein: No evidence of thrombus. Normal compressibility and flow on color Doppler imaging. Femoral Vein: No  evidence of thrombus. Normal compressibility, respiratory phasicity and response to augmentation. Popliteal Vein: No evidence of thrombus. Normal compressibility, respiratory phasicity and  response to augmentation. Calf Veins: No evidence of thrombus. Normal compressibility and flow on color Doppler imaging. Superficial Great Saphenous Vein: No evidence of thrombus. Normal compressibility. Venous Reflux:  None. Other Findings: There is edema in the subcutaneous plane without loculated fluid collections. IMPRESSION: No evidence of deep venous thrombosis in either lower extremity. Electronically Signed   By: Elmer Picker M.D.   On: 02/05/2022 10:06   US ARTERIAL ABI (SCREENING LOWER EXTREMITY)  Result Date: 02/05/2022 CLINICAL DATA:  Bilateral lower extremity pain and ulceration for 6 months EXAM: NONINVASIVE PHYSIOLOGIC VASCULAR STUDY OF BILATERAL LOWER EXTREMITIES TECHNIQUE: Evaluation of both lower extremities were performed at rest, including calculation of ankle-brachial indices with single level pressure measurements and doppler recording. COMPARISON:  None available FINDINGS: Right ABI:  1.45 Left ABI:  1.18 Right Lower Extremity:  Normal arterial waveforms at the ankle. Left Lower Extremity: Monophasic waveform seen in the posterior tibial and dorsalis pedis arteries. > 1.4 Non diagnostic secondary to incompressible vessel calcifications (medial arterial sclerosis of [place]) IMPRESSION: 1. Nondiagnostic evaluation of the right lower extremity due to super normal ABI value. 2. Monophasic waveforms in the left posterior tibial and dorsalis pedis arteries, suspicious for underlying arterial occlusive disease, despite normal ABI values. 3. Consider further evaluation with CT angiography of the lower extremities to better evaluate for arterial occlusive disease. Electronically Signed   By: Miachel Roux M.D.   On: 02/05/2022 10:00   DG Chest Portable 1 View  Result Date: 02/05/2022 CLINICAL DATA:  Shortness of breath. EXAM: PORTABLE CHEST 1 VIEW COMPARISON:  None Available. FINDINGS: Small bilateral pleural effusions with bibasilar atelectasis or infiltrate. No pneumothorax.  Top-normal cardiac size. No acute osseous pathology. IMPRESSION: Small bilateral pleural effusions with bibasilar atelectasis or infiltrate. Electronically Signed   By: Anner Crete M.D.   On: 02/05/2022 02:55        Scheduled Meds:  aspirin EC  81 mg Oral Daily   enoxaparin (LOVENOX) injection  0.5 mg/kg Subcutaneous Q24H   furosemide  40 mg Intravenous Q12H   furosemide  60 mg Intravenous Once   Continuous Infusions:  magnesium sulfate bolus IVPB       LOS: 1 day     Sidney Ace, MD [facility]   If 7PM-7AM, please contact night-coverage  02/06/2022, 10:23 AM

## 2022-02-06 NOTE — Progress Notes (Signed)
*  PRELIMINARY RESULTS* Echocardiogram 2D Echocardiogram has been performed.  Lawrence Murray 02/06/2022, 11:09 AM

## 2022-02-06 NOTE — Progress Notes (Signed)
Nutrition Brief Note  Patient identified on the Malnutrition Screening Tool (MST) Report and consulted for assessment of nutritional requirements/ status.   Wt Readings from Last 15 Encounters:  02/06/22 (!) 145.2 kg   Pt with no significant past medical history who has not been to a physician since childhood presents because of increasing peripheral edema shortness of breath  Pt admitted with CHF.   Reviewed I/O's: -3.8 L x 24 hours and -4.7 L since admission  UOP: 3.8 L x 24 hours  Spoke with pt, who was sitting in recliner chair at time of visit. He reports that he feels very overwhelmed being in the hospital related to stress, anxiety, home life, and new diagnosis. He reports he has been feeling ill since November and initially thought he had COVID, which he treated with leftover antibiotics that he had around the house. Since then, symptoms often waxed and waned (shortness of breath, difficulty breathing weakness). Pt shares that his legs were so edematous that it was difficult for him to get in and out of the shower.   Per pt, PTA he consumed one large meal per day and snacks on potato chips and fruits. Pt reports he lately tried an effort to eat more healthfully in attempt to improve his symptoms. He does not add salt to his food.   Pt shares that his weight has also fluctuated based upon his fluid retention. He used to weigh 320# and now estimates he about 280#.   Pt admits feeling overwhelmed by new diagnosis. He reports "there are so many people coming into my room, telling me all these things, and I can't focus. I'm listening, but it's going right through me". He was able to tell this RD that he was recently diagnosed with CHF and wonders if there was anything other than sodium and fluid intake that could have caused this (pt's father also has CHF). Pt disclosed to this RD that he struggles with panic attacks, anxiety, and depression and has not seen a physician since he was a  teenager; he has tried to receive help for this in the past, but does not feel like his concerns were heard. He also admits other other stress in his life, including caring for his father who was recently on hospice care, his 55 year old son, break-up with his ex-wife, and how to manage his healthcare without a job and insurance. RD provided emotional support and reflective listening; offered spiritual care consult, however, pt declined stating "I pray and I have enough people praying for me".   Pt remembers Heart Failure RN visit and was able to discuss basics of CHF management, mainly watching sodium and fluids. RD provided ways that pt could reduce sodium in diet, pointing out commonly consumed foods that were high in sodium such as potato chips. Pt replied "one bag of potato chips lasts me 7 days; that won't hurt me". RD then focused education on self-management- watching fluid and sodium intake, following up with MD, and monitoring weight daily to promote better quality of life and prevent further hospitalizations. Teachback method used and expect fair compliance. RD also provided "Low Sodium Nutrition Therapy" handout from [facility] Nutrition Care Manual.   Nutrition-Focused physical exam completed. Findings are no fat depletion, no muscle depletion, and moderate edema.     Medications reviewed and include lasix and magnesium sulfate.   Labs reviewed.   Body mass index is 45.92 kg/m. Patient meets criteria for obesity, class II based on current BMI. Obesity  is a complex, chronic medical condition that is optimally managed by a multidisciplinary care team. Weight loss is not an ideal goal for an acute inpatient hospitalization. However, if further work-up for obesity is warranted, consider outpatient referral to outpatient bariatric service and/or [facility].    Current diet order is Heart Healthy with 1.2 L fluid restriction, patient is consuming  approximately 100% of meals at this time. Labs and medications reviewed.   No nutrition interventions warranted at this time. If nutrition issues arise, please consult RD.   Loistine Chance, RD, LDN, [place] Registered Dietitian II Certified Diabetes Care and Education Specialist Please refer to [facility] for RD and/or RD on-call/weekend/after hours pager

## 2022-02-06 NOTE — Discharge Instructions (Signed)
Low Sodium Nutrition Therapy  Eating less sodium can help you if you have high blood pressure, heart failure, or kidney or liver disease.   Your body needs a little sodium, but too much sodium can cause your body to hold onto extra water. This extra water will raise your blood pressure and can cause damage to your heart, kidneys, or liver as they are forced to work harder.   Sometimes you can see how the extra fluid affects you because your hands, legs, or belly swell. You may also hold water around your heart and lungs, which makes it hard to breathe.   Even if you take medication for blood pressure or a water pill (diuretic) to remove fluid, it is still important to have less salt in your diet.   Check with your primary care provider before drinking alcohol since it may affect the amount of fluid in your body and how your heart, kidneys, or liver work. Sodium in Food A low-sodium meal plan limits the sodium that you get from food and beverages to 1,500-2,000 milligrams (mg) per day. Salt is the main source of sodium. Read the nutrition label on the package to find out how much sodium is in one serving of a food.  Select foods with 140 milligrams (mg) of sodium or less per serving.  You may be able to eat one or two servings of foods with a little more than 140 milligrams (mg) of sodium if you are closely watching how much sodium you eat in a day.  Check the serving size on the label. The amount of sodium listed on the label shows the amount in one serving of the food. So, if you eat more than one serving, you will get more sodium than the amount listed.  Tips Cutting Back on Sodium Eat more fresh foods.  Fresh fruits and vegetables are low in sodium, as well as frozen vegetables and fruits that have no added juices or sauces.  Fresh meats are lower in sodium than processed meats, such as bacon, sausage, and hotdogs.  Not all processed foods are unhealthy, but some processed foods may have too  much sodium.  Eat less salt at the table and when cooking. One of the ingredients in salt is sodium.  One teaspoon of table salt has 2,300 milligrams of sodium.  Leave the salt out of recipes for pasta, casseroles, and soups. Be a smart shopper.  Food packages that say "Salt-free", sodium-free", "very low sodium," and "low sodium" have less than 140 milligrams of sodium per serving.  Beware of products identified as "Unsalted," "No Salt Added," "Reduced Sodium," or "Lower Sodium." These items may still be high in sodium. You should always check the nutrition label. Add flavors to your food without adding sodium.  Try lemon juice, lime juice, or vinegar.  Dry or fresh herbs add flavor.  Buy a sodium-free seasoning blend or make your own at home. You can purchase salt-free or sodium-free condiments like barbeque sauce in stores and online. Ask your registered dietitian nutritionist for recommendations and where to find them.   Eating in Restaurants Choose foods carefully when you eat outside your home. Restaurant foods can be very high in sodium. Many restaurants provide nutrition facts on their menus or their websites. If you cannot find that information, ask your server. Let your server know that you want your food to be cooked without salt and that you would like your salad dressing and sauces to be served on the   side.    Foods Recommended Food Group Foods Recommended  Grains Bread, bagels, rolls without salted tops Homemade bread made with reduced-sodium baking powder Cold cereals, especially shredded wheat and puffed rice Oats, grits, or cream of wheat Pastas, quinoa, and rice Popcorn, pretzels or crackers without salt Corn tortillas  Protein Foods Fresh meats and fish; turkey bacon (check the nutrition labels - make sure they are not packaged in a sodium solution) Canned or packed tuna (no more than 4 ounces at 1 serving) Beans and peas Soybeans) and tofu Eggs Nuts or nut butters  without salt  Dairy Milk or milk powder Plant milks, such as rice and soy Yogurt, including Greek yogurt Small amounts of natural cheese (blocks of cheese) or reduced-sodium cheese can be used in moderation. (Swiss, ricotta, and fresh mozzarella cheese are lower in sodium than the others) Cream Cheese Low sodium cottage cheese  Vegetables Fresh and frozen vegetables without added sauces or salt Homemade soups (without salt) Low-sodium, salt-free or sodium-free canned vegetables and soups  Fruit Fresh and canned fruits Dried fruits, such as raisins, cranberries, and prunes  Oils Tub or liquid margarine, regular or without salt Canola, corn, peanut, olive, safflower, or sunflower oils  Condiments Fresh or dried herbs such as basil, bay leaf, dill, mustard (dry), nutmeg, paprika, parsley, rosemary, sage, or thyme.  Low sodium ketchup Vinegar  Lemon or lime juice Pepper, red pepper flakes, and cayenne. Hot sauce contains sodium, but if you use just a drop or two, it will not add up to much.  Salt-free or sodium-free seasoning mixes and marinades Simple salad dressings: vinegar and oil   Foods Not Recommended Food Group Foods Not Recommended  Grains Breads or crackers topped with salt Cereals (hot/cold) with more than 300 mg sodium per serving Biscuits, cornbread, and other "quick" breads prepared with baking soda Pre-packaged bread crumbs Seasoned and packaged rice and pasta mixes Self-rising flours  Protein Foods Cured meats: Bacon, ham, sausage, pepperoni and hot dogs Canned meats (chili, vienna sausage, or sardines) Smoked fish and meats Frozen meals that have more than 600 mg of sodium per serving Egg substitute (with added sodium)  Dairy Buttermilk Processed cheese spreads Cottage cheese (1 cup may have over 500 mg of sodium; look for low-sodium.) American or feta cheese Shredded Cheese has more sodium than blocks of cheese String cheese  Vegetables Canned vegetables  (unless they are salt-free, sodium-free or low sodium) Frozen vegetables with seasoning and sauces Sauerkraut and pickled vegetables Canned or dried soups (unless they are salt-free, sodium-free, or low sodium) French fries and onion rings  Fruit Dried fruits preserved with additives that have sodium  Oils Salted butter or margarine, all types of olives  Condiments Salt, sea salt, kosher salt, onion salt, and garlic salt Seasoning mixes with salt Bouillon cubes Ketchup Barbeque sauce and Worcestershire sauce unless low sodium Soy sauce Salsa, pickles, olives, relish Salad dressings: ranch, blue cheese, Italian, and French.   Low Sodium Sample 1-Day Menu  Breakfast 1 cup cooked oatmeal  1 slice whole wheat bread toast  1 tablespoon peanut butter without salt  1 banana  1 cup 1% milk  Lunch Tacos made with: 2 corn tortillas   cup black beans, low sodium   cup roasted or grilled chicken (without skin)   avocado  Squeeze of lime juice  1 cup salad greens  1 tablespoon low-sodium salad dressing   cup strawberries  1 orange  Afternoon Snack 1/3 cup grapes  6 ounces yogurt    Evening Meal 3 ounces herb-baked fish  1 baked potato  2 teaspoons olive oil   cup cooked carrots  2 thick slices tomatoes on:  2 lettuce leaves  1 teaspoon olive oil  1 teaspoon balsamic vinegar  1 cup 1% milk  Evening Snack 1 apple   cup almonds without salt   Low-Sodium Vegetarian (Lacto-Ovo) Sample 1-Day Menu  Breakfast 1 cup cooked oatmeal  1 slice whole wheat toast  1 tablespoon peanut butter without salt  1 banana  1 cup 1% milk  Lunch Tacos made with: 2 corn tortillas   cup black beans, low sodium   cup roasted or grilled chicken (without skin)   avocado  Squeeze of lime juice  1 cup salad greens  1 tablespoon low-sodium salad dressing   cup strawberries  1 orange  Evening Meal Stir fry made with:  cup tofu  1 cup brown rice   cup broccoli   cup green beans   cup  peppers   tablespoon peanut oil  1 orange  1 cup 1% milk  Evening Snack 4 strips celery  2 tablespoons hummus  1 hard-boiled egg   Low-Sodium Vegan Sample 1-Day Menu  Breakfast 1 cup cooked oatmeal  1 tablespoon peanut butter without salt  1 cup blueberries  1 cup soymilk fortified with calcium, vitamin B12, and vitamin D  Lunch 1 small whole wheat pita   cup cooked lentils  2 tablespoons hummus  4 carrot sticks  1 medium apple  1 cup soymilk fortified with calcium, vitamin B12, and vitamin D  Evening Meal Stir fry made with:  cup tofu  1 cup brown rice   cup broccoli   cup green beans   cup peppers   tablespoon peanut oil  1 cup cantaloupe  Evening Snack 1 cup soy yogurt   cup mixed nuts  Copyright 2020  [employer]y [employer]. All rights reserved  Sodium Free Flavoring Tips  When cooking, the following items may be used for flavoring instead of salt or seasonings that contain sodium. Remember: A little bit of spice goes a long way! Be careful not to overseason. Spice Blend Recipe (makes about ? cup) 5 teaspoons onion powder  2 teaspoons garlic powder  2 teaspoons paprika  2 teaspoon dry mustard  1 teaspoon crushed thyme leaves   teaspoon white pepper   teaspoon celery seed Food Item Flavorings  Beef Basil, bay leaf, caraway, curry, dill, dry mustard, garlic, grape jelly, green pepper, mace, marjoram, mushrooms (fresh), nutmeg, onion or onion powder, parsley, pepper, rosemary, sage  Chicken Basil, cloves, cranberries, mace, mushrooms (fresh), nutmeg, oregano, paprika, parsley, pineapple, saffron, sage, savory, tarragon, thyme, tomato, turmeric  Egg Chervil, curry, dill, dry mustard, garlic or garlic powder, green pepper, jelly, mushrooms (fresh), nutmeg, onion powder, paprika, parsley, rosemary, tarragon, tomato  Fish Basil, bay leaf, chervil, curry, dill, dry mustard, green pepper, lemon juice, marjoram, mushrooms (fresh), paprika, pepper,  tarragon, tomato, turmeric  Lamb Cloves, curry, dill, garlic or garlic powder, mace, mint, mint jelly, onion, oregano, parsley, pineapple, rosemary, tarragon, thyme  Pork Applesauce, basil, caraway, chives, cloves, garlic or garlic powder, onion or onion powder, rosemary, thyme  Veal Apricots, basil, bay leaf, currant jelly, curry, ginger, marjoram, mushrooms (fresh), oregano, paprika  Vegetables Basil, dill, garlic or garlic powder, ginger, lemon juice, mace, marjoram, nutmeg, onion or onion powder, tarragon, tomato, sugar or sugar substitute, salt-free salad dressing, vinegar  Desserts Allspice, anise, cinnamon, cloves, ginger, mace, nutmeg, vanilla extract, other   extracts   Copyright 2020  Academy of [employer]. All rights reserved  Fluid Restricted Nutrition Therapy  You have been prescribed this diet because your condition affects how much fluid you can eat or drink. If your heart, liver, or kidneys aren't working properly, you may not be able to effectively eliminate fluids from the body and this may cause swelling (edema) in the legs, arms, and/or stomach. Drink no more than _________ liters or ________ ounces or ________cups of fluid per day.  You don't need to stop eating or drinking the same fluids you normally would, but you may need to eat or drink less than usual.  Your registered dietitian nutritionist will help you determine the correct amount of fluid to consume during the day Breakfast Include fluids taken with medications  Lunch Include fluids taken with medications  Dinner Include fluids taken with medications  Bedtime Snack Include fluids taken with medications     Tips What Are Fluids?  A fluid is anything that is liquid or anything that would melt if left at room temperature. You will need to count these foods and liquids--including any liquid used to take medication--as part of your daily fluid intake. Some examples are: Alcohol (drink only with your  doctor's permission)  Coffee, tea, and other hot beverages  Gelatin (Jell-O)  Gravy  Ice cream, sherbet, sorbet  Ice cubes, ice chips  Milk, liquid creamer  Nutritional supplements  Popsicles  Vegetable and fruit juices; fluid in canned fruit  Watermelon  Yogurt  Soft drinks, lemonade, limeade  Soups  Syrup How Do I Measure My Fluid Intake? Record your fluid intake daily.  Tip: Every day, each time you eat or drink fluids, pour water in the same amount into an empty container that can hold the same amount of fluids you are allowed daily. This may help you keep track of how much fluid you are taking in throughout the day.  To accurately keep track of how much liquid you take in, measure the size of the cups, glasses, and bowls you use. If you eat soup, measure how much of it is liquid and how much is solid (such as noodles, vegetables, meat). Conversions for Measuring Fluid Intake  Milliliters (mL) Liters (L) Ounces (oz) Cups (c)  1000 1 32 4  1200 1.2 40 5  1500 1.5 50 6 1/4  1800 1.8 60 7 1/2  2000 2 67 8 1/3  Tips to Reduce Your Thirst Chew gum or suck on hard candy.  Rinse or gargle with mouthwash. Do not swallow.  Ice chips or popsicles my help quench thirst, but this too needs to be calculated into the total restriction. Melt ice chips or cubes first to figure out how much fluid they produce (for example, experiment with melting  cup ice chips or 2 ice cubes).  Add a lemon wedge to your water.  Limit how much salt you take in. A high salt intake might make you thirstier.  Don't eat or drink all your allowed liquids at once. Space your liquids out through the day.  Use small glasses and cups and sip slowly. If allowed, take your medications with fluids you eat or drink during a meal.   Fluid-Restricted Nutrition Therapy Sample 1-Day Menu  Breakfast 1 slice wheat toast  1 tablespoon peanut butter  1/2 cup yogurt (120 milliliters)  1/2 cup blueberries  1 cup milk (240  milliliters)   Lunch 3 ounces sliced turkey  2 slices whole wheat   bread  1/2 cup lettuce for sandwich  2 slices tomato for sandwich  1 ounce reduced-fat, reduced-sodium cheese  1/2 cup fresh carrot sticks  1 banana  1 cup unsweetened tea (240 milliliters)   Evening Meal 8 ounces soup (240 milliliters)  3 ounces salmon  1/2 cup quinoa  1 cup green beans  1 cup mixed greens salad  1 tablespoon olive oil  1 cup coffee (240 milliliters)  Evening Snack 1/2 cup sliced peaches  1/2 cup frozen yogurt (120 milliliters)  1 cup water (240 milliliters)  Copyright 2020  Academy of [employer]. All rights reserved   Plate Method for Diabetes   Foods with carbohydrates make your blood glucose level go up. The plate method is a simple way to meal plan and control the amount of carbohydrate you eat.         Use the following guidance to build a healthy plate to control carbohydrates. Divide a 9-inch plate into 3 sections, and consider your beverage the 4th section of your meal: Food Group Examples of Foods/Beverages for This Section of your Meal  Section 1: Non-starchy vegetables Fill  of your plate to include non-starchy vegetables Asparagus, broccoli, brussels sprouts, cabbage, carrots, cauliflower, celery, cucumber, green beans, mushrooms, peppers, salad greens, tomatoes, or zucchini.  Section 2: Protein foods Fill  of your plate to include a lean protein Lean meat, poultry, fish, seafood, cheese, eggs, lean deli meat, tofu, beans, lentils, nuts or nut butters.  Section 3: Carbohydrate foods Fill  of your plate to include carbohydrate foods Whole grains, whole wheat bread, brown rice, whole grain pasta, polenta, corn tortillas, fruit, or starchy vegetables (potatoes, green peas, corn, beans, acorn squash, and butternut squash). One cup of milk also counts as a food that contains carbohydrate.  Section 4: Beverage Choose water or a low-calorie drink for your beverage. Unsweetened  tea, coffee, or flavored/sparkling water without added sugar.  Image reprinted with permission from The American [employer] Association.  Copyright 2022 by the American Diabetes Association.   Copyright 2022  [employer]y [employer]. All rights reserved   Carbohydrate Counting For People With Diabetes  Foods with carbohydrates make your blood glucose level go up. Learning how to count carbohydrates can help you control your blood glucose levels. First, identify the foods you eat that contain carbohydrates. Then, using the Foods with Carbohydrates chart, determine about how much carbohydrates are in your meals and snacks. Make sure you are eating foods with fiber, protein, and healthy fat along with your carbohydrate foods. Foods with Carbohydrates The following table shows carbohydrate foods that have about 15 grams of carbohydrate each. Using measuring cups, spoons, or a food scale when you first begin learning about carbohydrate counting can help you learn about the portion sizes you typically eat. The following foods have 15 grams carbohydrate each:  Grains 1 slice bread (1 ounce)  1 small tortilla (6-inch size)   large bagel (1 ounce)  1/3 cup pasta or rice (cooked)   hamburger or hot dog bun ( ounce)   cup cooked cereal   to  cup ready-to-eat cereal  2 taco shells (5-inch size) Fruit 1 small fresh fruit ( to 1 cup)   medium banana  17 small grapes (3 ounces)  1 cup melon or berries   cup canned or frozen fruit  2 tablespoons dried fruit (blueberries, cherries, cranberries, raisins)   cup unsweetened fruit juice  Starchy Vegetables  cup cooked beans, peas, corn, potatoes/sweet potatoes  large baked potato (3 ounces)  1 cup acorn or butternut squash  Snack Foods 3 to 6 crackers  8 potato chips or 13 tortilla chips ( ounce to 1 ounce)  3 cups popped popcorn  Dairy 3/4 cup (6 ounces) nonfat plain yogurt, or yogurt with sugar-free sweetener  1 cup milk  1  cup plain rice, soy, coconut or flavored almond milk Sweets and Desserts  cup ice cream or frozen yogurt  1 tablespoon jam, jelly, pancake syrup, table sugar, or honey  2 tablespoons light pancake syrup  1 inch square of frosted cake or 2 inch square of unfrosted cake  2 small cookies (2/3 ounce each) or  large cookie  Sometimes you'll have to estimate carbohydrate amounts if you don't know the exact recipe. One cup of mixed foods like soups can have 1 to 2 carbohydrate servings, while some casseroles might have 2 or more servings of carbohydrate. Foods that have less than 20 calories in each serving can be counted as "free" foods. Count 1 cup raw vegetables, or  cup cooked non-starchy vegetables as "free" foods. If you eat 3 or more servings at one meal, then count them as 1 carbohydrate serving.  Foods without Carbohydrates  Not all foods contain carbohydrates. Meat, some dairy, fats, non-starchy vegetables, and many beverages don't contain carbohydrate. So when you count carbohydrates, you can generally exclude chicken, pork, beef, fish, seafood, eggs, tofu, cheese, butter, sour cream, avocado, nuts, seeds, olives, mayonnaise, water, black coffee, unsweetened tea, and zero-calorie drinks. Vegetables with no or low carbohydrate include green beans, cauliflower, tomatoes, and onions. How much carbohydrate should I eat at each meal?  Carbohydrate counting can help you plan your meals and manage your weight. Following are some starting points for carbohydrate intake at each meal. Work with your registered dietitian nutritionist to find the best range that works for your blood glucose and weight.   To Lose Weight To Maintain Weight  Women 2 - 3 carb servings 3 - 4 carb servings  Men 3 - 4 carb servings 4 - 5 carb servings  Checking your blood glucose after meals will help you know if you need to adjust the timing, type, or number of carbohydrate servings in your meal plan. Achieve and keep a healthy  body weight by balancing your food intake and physical activity.  Tips How should I plan my meals?  Plan for half the food on your plate to include non-starchy vegetables, like salad greens, broccoli, or carrots. Try to eat 3 to 5 servings of non-starchy vegetables every day. Have a protein food at each meal. Protein foods include chicken, fish, meat, eggs, or beans (note that beans contain carbohydrate). These two food groups (non-starchy vegetables and proteins) are low in carbohydrate. If you fill up your plate with these foods, you will eat less carbohydrate but still fill up your stomach. Try to limit your carbohydrate portion to  of the plate.  What fats are healthiest to eat?  Diabetes increases risk for heart disease. To help protect your heart, eat more healthy fats, such as olive oil, nuts, and avocado. Eat less saturated fats like butter, cream, and high-fat meats, like bacon and sausage. Avoid trans fats, which are in all foods that list "partially hydrogenated oil" as an ingredient. What should I drink?  Choose drinks that are not sweetened with sugar. The healthiest choices are water, carbonated or seltzer waters, and tea and coffee without added sugars.  Sweet drinks will make your  blood glucose go up very quickly. One serving of soda or energy drink is  cup. It is best to drink these beverages only if your blood glucose is low.  Artificially sweetened, or diet drinks, typically do not increase your blood glucose if they have zero calories in them. Read labels of beverages, as some diet drinks do have carbohydrate and will raise your blood glucose. Label Reading Tips Read Nutrition Facts labels to find out how many grams of carbohydrate are in a food you want to eat. Don't forget: sometimes serving sizes on the label aren't the same as how much food you are going to eat, so you may need to calculate how much carbohydrate is in the food you are serving yourself.   Carbohydrate Counting  for People with Diabetes Sample 1-Day Menu  Breakfast  cup yogurt, low fat, low sugar (1 carbohydrate serving)   cup cereal, ready-to-eat, unsweetened (1 carbohydrate serving)  1 cup strawberries (1 carbohydrate serving)   cup almonds ( carbohydrate serving)  Lunch 1, 5 ounce can chunk light tuna  2 ounces cheese, low fat cheddar  6 whole wheat crackers (1 carbohydrate serving)  1 small apple (1 carbohydrate servings)   cup carrots ( carbohydrate serving)   cup snap peas  1 cup 1% milk (1 carbohydrate serving)   Evening Meal Stir fry made with: 3 ounces chicken  1 cup brown rice (3 carbohydrate servings)   cup broccoli ( carbohydrate serving)   cup green beans   cup onions  1 tablespoon olive oil  2 tablespoons teriyaki sauce ( carbohydrate serving)  Evening Snack 1 extra small banana (1 carbohydrate serving)  1 tablespoon peanut butter   Carbohydrate Counting for People with Diabetes Vegan Sample 1-Day Menu  Breakfast 1 cup cooked oatmeal (2 carbohydrate servings)   cup blueberries (1 carbohydrate serving)  2 tablespoons flaxseeds  1 cup soymilk fortified with calcium and vitamin D  1 cup coffee  Lunch 2 slices whole wheat bread (2 carbohydrate servings)   cup baked tofu   cup lettuce  2 slices tomato  2 slices avocado   cup baby carrots ( carbohydrate serving)  1 orange (1 carbohydrate serving)  1 cup soymilk fortified with calcium and vitamin D   Evening Meal Burrito made with: 1 6-inch corn tortilla (1 carbohydrate serving)  1 cup refried vegetarian beans (2 carbohydrate servings)   cup chopped tomatoes   cup lettuce   cup salsa  1/3 cup brown rice (1 carbohydrate serving)  1 tablespoon olive oil for rice   cup zucchini   Evening Snack 6 small whole grain crackers (1 carbohydrate serving)  2 apricots ( carbohydrate serving)   cup unsalted peanuts ( carbohydrate serving)    Carbohydrate Counting for People with Diabetes Vegetarian  (Lacto-Ovo) Sample 1-Day Menu  Breakfast 1 cup cooked oatmeal (2 carbohydrate servings)   cup blueberries (1 carbohydrate serving)  2 tablespoons flaxseeds  1 egg  1 cup 1% milk (1 carbohydrate serving)  1 cup coffee  Lunch 2 slices whole wheat bread (2 carbohydrate servings)  2 ounces low-fat cheese   cup lettuce  2 slices tomato  2 slices avocado   cup baby carrots ( carbohydrate serving)  1 orange (1 carbohydrate serving)  1 cup unsweetened tea  Evening Meal Burrito made with: 1 6-inch corn tortilla (1 carbohydrate serving)   cup refried vegetarian beans (1 carbohydrate serving)   cup tomatoes   cup lettuce   cup salsa  1/3 cup brown  rice (1 carbohydrate serving)  1 tablespoon olive oil for rice   cup zucchini  1 cup 1% milk (1 carbohydrate serving)  Evening Snack 6 small whole grain crackers (1 carbohydrate serving)  2 apricots ( carbohydrate serving)   cup unsalted peanuts ( carbohydrate serving)    Copyright 2020  [employer]y [employer]. All rights reserved.  Using Nutrition Labels: Carbohydrate  Serving Size  Look at the serving size. All the information on the label is based on this portion. Servings Per Container  The number of servings contained in the package. Guidelines for Carbohydrate  Look at the total grams of carbohydrate in the serving size.  1 carbohydrate choice = 15 grams of carbohydrate. Range of Carbohydrate Grams Per Choice  Carbohydrate Grams/Choice Carbohydrate Choices  6-10   11-20 1  21-25 1  26-35 2  36-40 2  41-50 3  51-55 3  56-65 4  66-70 4  71-80 5    Copyright 2020  Academy of Nutrition and Dietetics. All rights reserved.

## 2022-02-07 ENCOUNTER — Inpatient Hospital Stay: Payer: Medicaid Other

## 2022-02-07 DIAGNOSIS — E1165 Type 2 diabetes mellitus with hyperglycemia: Secondary | ICD-10-CM

## 2022-02-07 DIAGNOSIS — I739 Peripheral vascular disease, unspecified: Secondary | ICD-10-CM | POA: Diagnosis not present

## 2022-02-07 DIAGNOSIS — R9431 Abnormal electrocardiogram [ECG] [EKG]: Secondary | ICD-10-CM | POA: Diagnosis not present

## 2022-02-07 DIAGNOSIS — R778 Other specified abnormalities of plasma proteins: Secondary | ICD-10-CM | POA: Diagnosis not present

## 2022-02-07 DIAGNOSIS — I5021 Acute systolic (congestive) heart failure: Secondary | ICD-10-CM

## 2022-02-07 DIAGNOSIS — I5043 Acute on chronic combined systolic (congestive) and diastolic (congestive) heart failure: Secondary | ICD-10-CM | POA: Diagnosis not present

## 2022-02-07 DIAGNOSIS — F17200 Nicotine dependence, unspecified, uncomplicated: Secondary | ICD-10-CM

## 2022-02-07 DIAGNOSIS — E119 Type 2 diabetes mellitus without complications: Secondary | ICD-10-CM | POA: Insufficient documentation

## 2022-02-07 LAB — GLUCOSE, CAPILLARY
Glucose-Capillary: 147 mg/dL — ABNORMAL HIGH (ref 70–99)
Glucose-Capillary: 189 mg/dL — ABNORMAL HIGH (ref 70–99)
Glucose-Capillary: 215 mg/dL — ABNORMAL HIGH (ref 70–99)
Glucose-Capillary: 150 mg/dL — ABNORMAL HIGH (ref 70–99)

## 2022-02-07 LAB — BASIC METABOLIC PANEL
Anion gap: 8 (ref 5–15)
BUN: 17 mg/dL (ref 6–20)
CO2: 35 mmol/L — ABNORMAL HIGH (ref 22–32)
Calcium: 8.9 mg/dL (ref 8.9–10.3)
Chloride: 97 mmol/L — ABNORMAL LOW (ref 98–111)
Creatinine, Ser: 0.99 mg/dL (ref 0.61–1.24)
GFR, Estimated: >60 mL/min (ref >60)
Glucose, Bld: 145 mg/dL — ABNORMAL HIGH (ref 70–99)
Potassium: 4.3 mmol/L (ref 3.5–5.1)
Sodium: 140 mmol/L (ref 135–145)

## 2022-02-07 MED ORDER — LIVING WELL WITH DIABETES BOOK
Freq: Once | Status: AC
Start: 2022-02-07 — End: 2022-02-07
  Filled 2022-02-07: qty 1

## 2022-02-07 MED ORDER — QUETIAPINE FUMARATE 25 MG PO TABS
25.0000 mg | ORAL_TABLET | Freq: Every day | ORAL | Status: DC
  Administered 2022-02-07 – 2022-02-28 (×22): 25 mg via ORAL
  Filled 2022-02-07 (×22): qty 1

## 2022-02-07 MED ORDER — FUROSEMIDE 10 MG/ML IJ SOLN
60.0000 mg | Freq: Two times a day (BID) | INTRAMUSCULAR | Status: DC
  Administered 2022-02-07 – 2022-02-09 (×4): 60 mg via INTRAVENOUS
  Filled 2022-02-07 (×4): qty 6

## 2022-02-07 NOTE — Progress Notes (Addendum)
  RD consulted for nutrition education regarding diabetes.   Lab Results  Component Value Date   HGBA1C 7.7 (H) 02/05/2022   PTA DM medications are none.   Labs reviewed: CBGS: 109-202 (inpatient orders for glycemic control are 0-15 units insulin aspart TID with meals and 0-5 units insulin aspart daily).    RD provided "Carbohydrate Counting for People with Diabetes" handout from the Academy of Nutrition and Dietetics. Attached to AVS/ discharge summary.   Pt sleeping in recliner chair at time of visit. He did not arouse to name being called. RD spent a large amount of time with pt yesterday and reports had not been sleeping well. See note written on 02/06/22 for further details. Case discussed with DM coordinator and Heart Failure RN.   Loistine Chance, RD, LDN, [place] Registered Dietitian II Certified Diabetes Care and Education Specialist Please refer to [facility] for RD and/or RD on-call/weekend/after hours pager

## 2022-02-07 NOTE — Inpatient Diabetes Management (Addendum)
Inpatient Diabetes Program Recommendations  AACE/ADA: New Consensus Statement on Inpatient Glycemic Control   Target Ranges:  Prepandial:   less than 140 mg/dL      Peak postprandial:   less than 180 mg/dL (1-2 hours)      Critically ill patients:  140 - 180 mg/dL    Latest Reference Range & Units 02/06/22 12:33 02/06/22 16:34 02/06/22 20:34 02/07/22 08:55  Glucose-Capillary 70 - 99 mg/dL 202 (H) 109 (H) 194 (H) 147 (H)    Latest Reference Range & Units 02/05/22 02:18  Glucose 70 - 99 mg/dL 195 (H)    Latest Reference Range & Units 02/05/22 21:53  Hemoglobin A1C 4.8 - 5.6 % 7.7 (H)   Review of Glycemic Control  Diabetes history: No (new DM dx this admission) Outpatient Diabetes medications: NA Current orders for Inpatient glycemic control: Novolog 0-15 units TID with meals, Novolog 0-5 units QHS  Inpatient Diabetes Program Recommendations:    HbgA1C: A1C 7.7% on 02/05/22 indicating an average glucose of 174 mg/dl over the past 2-3 months.  NOTE:Per chart review, patient does not have an hx of DM. Initial lab glucose 195 mg/dl on 02/05/22 and A1C 7.7%. Patient does not have any insurance and has not seen a provider in years. Ordered Living Well with DM book, RD consult for diet education, and TOC consult to assist with follow up and medication assistance. Will plan to talk with patient today.  Addendum 02/07/22'@12'$ :20-Spoke with patient at bedside about new diabetes diagnosis.  Patient reports that he has not seen a doctor in years (last time in 1980-90's.  Patient reports that he does not have a family hx of DM and never been told he had DM. However, patient states that he had been looking online doing research himself since November and he felt like he had CHF and DM.  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 Patient reports that he is unemployed and him, his father, and his son live off his father's [employer] farm [employer]. Patient has a 72 year old son  that he has split custody with.   Patient reports that he has anxiety and that he is feeling overwhelmed with new CHF and DM dx.  Discussed A1C results (7.7% on 02/05/22) and explained what an A1C is and informed patient that his current A1C indicates an average glucose of 174 mg/dl over the past 2-3 months. Discussed basic pathophysiology of DM Type 2, basic home care, importance of checking CBGs and maintaining good CBG control to prevent long-term and short-term complications. Reviewed glucose and A1C goals. Reviewed signs and symptoms of hyperglycemia and hypoglycemia along with treatment for both. Discussed impact of nutrition, exercise, stress, sickness, and medications on diabetes control. Informed patient that a Living Well with diabetes booklet has been ordered and encouraged patient to read through entire book.  Patient stated he was not a reader. Encouraged him to perhaps have a family member review it with him as well.  Patient reports that he has been trying to get help so he can see a doctor but he has not been able to get any help and he feels like he can't get the help he needs. Informed patient that TOC would be consulted for assistance with follow up and medication assistance if possible.   Informed patient that while inpatient, he will be given insulin for glycemic control but I anticipate he will be discharged on oral DM medication.  Patient verbalized understanding of information discussed but  notes he feels like he is being told so much information it is going through him. Patient has no further questions at this time related to diabetes.   RNs to provide ongoing basic DM education at bedside with this patient and engage patient to actively check blood glucose and administer insulin injections.  Throughout conversation, patient kept getting of diabetes subject and talking about his inability to get help he needs, stressors (taking care of his father, limited funds to live on from his father's  social security, his concerns about being able to afford the foods he needs, his anxiety, his inability to sleep, being in the hospital, and new dx of DM and CHF). Diabetes coordinator provided emotional support and allowed patient to express himself and had to redirect the conversation back to DM multiple times.    Thanks, Barnie Alderman, RN, MSN, [place] Diabetes Coordinator Inpatient Diabetes Program [phone] (Team Pager from 8am to [place])

## 2022-02-07 NOTE — Progress Notes (Addendum)
   Heart Failure Nurse Navigator Note  Met with patient today, he was sitting up in chair at bedside.  He voices concern over " so many people coming in to talk to him about his condition.  He feels that he is getting mixed messages."  He states that cardiology talk to him today about having a heart catheterization.  He is concerned over it being painful, reassurance given.  He realizes that the function of his heart is weaker than normal.  Asked patient if he had read any of the education materials he was given yesterday, he stated "no he has too much to deal with."  Nurse reported that he had spoke with her that he had researched on the Internet how to treat his HF, and one way he reported was by increasing fluid intake.  He states that he would rather have his heart failure education closer to when he is being discharged to home as right now he feels that he is not absorbing the information.  He also states that he has treated himself at home prior to this admission for fluid overload by going on youTube,  various websites with using over-the-counter supplements/vitamins. Again reminded when using supplements/vitamins to check with his doctors to make sure there is no interactions.  Reports that he had lost 20 pounds and then gained it back.  Will continue to follow.  Tresa Endo RN [facility]

## 2022-02-07 NOTE — Consult Note (Addendum)
Cardiology Consultation:   Patient ID: Lawrence Murray MRN: [id number]; DOB: 29-Jun-1966  Admit date: 02/05/2022 Date of Consult: 02/07/2022  PCP:  Merryl Hacker, No   [facility] Providers Cardiologist:  new- [place] rounding   Patient Profile:   Lawrence Murray is a 56 y.o. male with a hx of obesity, smoker who is being seen 02/07/2022 for the evaluation of shortness of breath at the request of Dr. Mal Misty.  History of Present Illness:   Lawrence Murray is a 56 year old male with history of obesity, current smoker x20+ years who presents with worsening shortness of breath, orthopnea, edema x6 months.  States not feeling well for about 6 months now, unable to lay flat on his back due to shortness of breath, also has leg swelling leading to blisters.  Presented to the hospital due to worsening symptoms.  States father has congestive heart failure.    Echocardiogram obtained yesterday showed reduced ejection fraction EF 30 to 35%.  Started on Lasix. EKG on admission showing sinus tachycardia, BNP 919, chest x-ray showed bilateral pleural effusion   History reviewed. No pertinent past medical history.  History reviewed. No pertinent surgical history.   Home Medications:  Prior to Admission medications   Not on File    Inpatient Medications: Scheduled Meds:  aspirin EC  81 mg Oral Daily   enoxaparin (LOVENOX) injection  0.5 mg/kg Subcutaneous Q24H   furosemide  40 mg Intravenous Q12H   insulin aspart  0-15 Units Subcutaneous TID WC   insulin aspart  0-5 Units Subcutaneous QHS   living well with diabetes book   Does not apply Once   Continuous Infusions:  PRN Meds: ALPRAZolam  Allergies:   No Known Allergies  Social History:   Social History   Socioeconomic History   Marital status: Single    Spouse name: Not on file   Number of children: Not on file   Years of education: Not on file   Highest education level: Not on file  Occupational History   Not on file  Tobacco Use    Smoking status: Former    Types: Cigarettes   Smokeless tobacco: Never  Vaping Use   Vaping Use: Not on file  Substance and Sexual Activity   Alcohol use: Never   Drug use: Never   Sexual activity: Not Currently  Other Topics Concern   Not on file  Social History Narrative   Not on file   Social Determinants of Health   Financial Resource Strain: Not on file  Food Insecurity: Not on file  Transportation Needs: Not on file  Physical Activity: Not on file  Stress: Not on file  Social Connections: Not on file  Intimate Partner Violence: Not on file    Family History:    Family History  Problem Relation Age of Onset   Congestive Heart Failure Father      ROS:  Please see the history of present illness.   All other ROS reviewed and negative.     Physical Exam/Data:   Vitals:   02/07/22 0441 02/07/22 0723 02/07/22 0800 02/07/22 1139  BP: 99/74 110/71  102/77  Pulse:  98  (!) 102  Resp: '20 14  17  '$ Temp: 98.5 F (36.9 C) 98.6 F (37 C)  98.2 F (36.8 C)  TempSrc: Oral Oral    SpO2: 94% 92%  94%  Weight: 119.5 kg  122.4 kg   Height:        Intake/Output Summary (Last 24 hours) at 02/07/2022 1223 Last  data filed at 02/07/2022 1142 Gross per 24 hour  Intake 240 ml  Output 3555 ml  Net -3315 ml      02/07/2022    8:00 AM 02/07/2022    4:41 AM 02/06/2022    5:00 AM  Last 3 Weights  Weight (lbs) 269 lb 13.5 oz 263 lb 7.2 oz 320 lb  Weight (kg) 122.4 kg 119.5 kg 145.151 kg     Body mass index is 38.72 kg/m.  General:  Well nourished, well developed, in no acute distress HEENT: normal Neck: Unable to assess JVD due to body habitus Vascular: No carotid bruits; Distal pulses 2+ bilaterally Cardiac:  normal S1, S2; RRR; no murmur  Lungs: Diminished breath sounds bilaterally Abd: soft, nontender, distended Ext: Leg edema, lower extremity wrapped in dressing Musculoskeletal:  No deformities, BUE and BLE strength normal and equal Skin: warm and dry  Neuro:  CNs  2-12 intact, no focal abnormalities noted Psych:  Normal affect   EKG:  The EKG was personally reviewed and demonstrates: Sinus tachycardia Telemetry:  Telemetry was personally reviewed and demonstrates: Sinus tachycardia  Relevant CV Studies: Echocardiogram 02/06/2022 EF 30 to 35%  Laboratory Data:  High Sensitivity Troponin:   Recent Labs  Lab 02/05/22 0218 02/05/22 0403  TROPONINIHS 370* 367*     Chemistry Recent Labs  Lab 02/05/22 2153 02/06/22 1048 02/07/22 0618  NA 139 141 140  K 4.2 4.1 4.3  CL 100 99 97*  CO2 30 34* 35*  GLUCOSE 159* 200* 145*  BUN '16 14 17  '$ CREATININE 0.96 0.92 0.99  CALCIUM 8.9 8.8* 8.9  MG 1.8  --   --   GFRNONAA >60 >60 >60  ANIONGAP '9 8 8    '$ Recent Labs  Lab 02/05/22 0218  PROT 7.4  ALBUMIN 3.4*  AST 24  ALT 23  ALKPHOS 69  BILITOT 0.9   Lipids No results for input(s): "CHOL", "TRIG", "HDL", "LABVLDL", "LDLCALC", "CHOLHDL" in the last 168 hours.  Hematology Recent Labs  Lab 02/05/22 0218 02/05/22 2153  WBC 7.0 6.3  RBC 4.32 4.29  HGB 13.8 13.3  HCT 43.2 41.6  MCV 100.0 97.0  MCH 31.9 31.0  MCHC 31.9 32.0  RDW 16.8* 16.7*  PLT 174 165   Thyroid  Recent Labs  Lab 02/05/22 2153  TSH 9.011*  FREET4 0.92    BNP Recent Labs  Lab 02/05/22 0218  BNP 919.6*    DDimer No results for input(s): "DDIMER" in the last 168 hours.   Radiology/Studies:  DG Chest 2 View  Result Date: 02/07/2022 CLINICAL DATA:  Pleural effusion EXAM: CHEST - 2 VIEW COMPARISON:  02/05/2022 FINDINGS: Persistent bilateral pleural effusions with adjacent atelectasis. Cardiomediastinal contours are partially obscured. IMPRESSION: Similar bilateral pleural effusions with adjacent atelectasis. Electronically Signed   By: Macy Mis M.D.   On: 02/07/2022 11:33   ECHOCARDIOGRAM COMPLETE  Result Date: 02/06/2022    ECHOCARDIOGRAM REPORT   Patient Name:   Lawrence Murray Date of Exam: 02/06/2022 Medical Rec #:  [id number]    Height:       70.0 in  Accession #:    [id number]   Weight:       320.0 lb Date of Birth:  26-May-1966   BSA:          2.549 m Patient Age:    42 years     BP:           111/74 mmHg Patient Gender: M  HR:           93 bpm. Exam Location:  [facility] Procedure: 2D Echo and Cardiac Doppler Indications:     CHF I50.9  History:         Patient has no prior history of Echocardiogram examinations.  Sonographer:     Sherrie Sport Referring Phys:  [place] Diagnosing Phys: Donnelly Angelica  Sonographer Comments: Technically challenging study due to limited acoustic windows and no apical window. IMPRESSIONS  1. Left ventricular ejection fraction, by estimation, is 30 to 35%. The left ventricle has moderately decreased function. The left ventricle demonstrates global hypokinesis. Left ventricular diastolic function could not be evaluated.  2. RV not well visualized but likely enlarged.  3. Large pleural effusion.  4. The mitral valve is normal in structure. Mild mitral valve regurgitation.  5. The aortic valve is normal in structure. Aortic valve regurgitation is not visualized. Conclusion(s)/Recommendation(s): Limited study, very technically challenging. Likey left pleural effusion, less likely large posterior pericardial effusion. FINDINGS  Left Ventricle: Left ventricular ejection fraction, by estimation, is 30 to 35%. The left ventricle has moderately decreased function. The left ventricle demonstrates global hypokinesis. The left ventricular internal cavity size was normal in size. There is no left ventricular hypertrophy. Left ventricular diastolic function could not be evaluated. Right Ventricle: RV not well visualized but likely enlarged. Left Atrium: Left atrial size was not assessed. Right Atrium: Right atrial size was not assessed. Pericardium: There is no evidence of pericardial effusion. Mitral Valve: The mitral valve is normal in structure. Mild mitral valve regurgitation. Tricuspid Valve: The tricuspid valve is normal in  structure. Tricuspid valve regurgitation is mild. Aortic Valve: The aortic valve is normal in structure. Aortic valve regurgitation is not visualized. Pulmonic Valve: The pulmonic valve was not well visualized. Pulmonic valve regurgitation is not visualized. Aorta: The aortic root and ascending aorta are structurally normal, with no evidence of dilitation. Pulmonary Artery: Probably dilated, but not fully characterized. Additional Comments: There is a large pleural effusion.  LEFT VENTRICLE PLAX 2D LVIDd:         5.50 cm LVIDs:         4.00 cm LV PW:         0.90 cm LV IVS:        1.10 cm LVOT diam:     2.00 cm LVOT Area:     3.14 cm  LEFT ATRIUM         Index LA diam:    5.30 cm 2.08 cm/m   AORTA Ao Root diam: 3.33 cm  SHUNTS Systemic Diam: 2.00 cm Donnelly Angelica Electronically signed by Donnelly Angelica Signature Date/Time: 02/06/2022/2:02:33 PM    Final    US Venous Img Lower Bilateral (DVT)  Result Date: 02/05/2022 CLINICAL DATA:  Pain and swelling EXAM: BILATERAL LOWER EXTREMITY VENOUS DOPPLER ULTRASOUND TECHNIQUE: Gray-scale sonography with graded compression, as well as color Doppler and duplex ultrasound were performed to evaluate the lower extremity deep venous systems from the level of the common femoral vein and including the common femoral, femoral, profunda femoral, popliteal and calf veins including the posterior tibial, peroneal and gastrocnemius veins when visible. The superficial great saphenous vein was also interrogated. Spectral Doppler was utilized to evaluate flow at rest and with distal augmentation maneuvers in the common femoral, femoral and popliteal veins. COMPARISON:  None Available. FINDINGS: RIGHT LOWER EXTREMITY Common Femoral Vein: No evidence of thrombus. Normal compressibility, respiratory phasicity and response to augmentation. Saphenofemoral Junction: No evidence  of thrombus. Normal compressibility and flow on color Doppler imaging. Profunda Femoral Vein: No evidence of thrombus.  Normal compressibility and flow on color Doppler imaging. Femoral Vein: No evidence of thrombus. Normal compressibility, respiratory phasicity and response to augmentation. Popliteal Vein: No evidence of thrombus. Normal compressibility, respiratory phasicity and response to augmentation. Calf Veins: No evidence of thrombus. Normal compressibility and flow on color Doppler imaging. Superficial Great Saphenous Vein: No evidence of thrombus. Normal compressibility. Venous Reflux:  None. Other Findings:  There is edema in the subcutaneous plane. LEFT LOWER EXTREMITY Common Femoral Vein: No evidence of thrombus. Normal compressibility, respiratory phasicity and response to augmentation. Saphenofemoral Junction: No evidence of thrombus. Normal compressibility and flow on color Doppler imaging. Profunda Femoral Vein: No evidence of thrombus. Normal compressibility and flow on color Doppler imaging. Femoral Vein: No evidence of thrombus. Normal compressibility, respiratory phasicity and response to augmentation. Popliteal Vein: No evidence of thrombus. Normal compressibility, respiratory phasicity and response to augmentation. Calf Veins: No evidence of thrombus. Normal compressibility and flow on color Doppler imaging. Superficial Great Saphenous Vein: No evidence of thrombus. Normal compressibility. Venous Reflux:  None. Other Findings: There is edema in the subcutaneous plane without loculated fluid collections. IMPRESSION: No evidence of deep venous thrombosis in either lower extremity. Electronically Signed   By: Elmer Picker M.D.   On: 02/05/2022 10:06   US ARTERIAL ABI (SCREENING LOWER EXTREMITY)  Result Date: 02/05/2022 CLINICAL DATA:  Bilateral lower extremity pain and ulceration for 6 months EXAM: NONINVASIVE PHYSIOLOGIC VASCULAR STUDY OF BILATERAL LOWER EXTREMITIES TECHNIQUE: Evaluation of both lower extremities were performed at rest, including calculation of ankle-brachial indices with single level  pressure measurements and doppler recording. COMPARISON:  None available FINDINGS: Right ABI:  1.45 Left ABI:  1.18 Right Lower Extremity:  Normal arterial waveforms at the ankle. Left Lower Extremity: Monophasic waveform seen in the posterior tibial and dorsalis pedis arteries. > 1.4 Non diagnostic secondary to incompressible vessel calcifications (medial arterial sclerosis of [place]) IMPRESSION: 1. Nondiagnostic evaluation of the right lower extremity due to super normal ABI value. 2. Monophasic waveforms in the left posterior tibial and dorsalis pedis arteries, suspicious for underlying arterial occlusive disease, despite normal ABI values. 3. Consider further evaluation with CT angiography of the lower extremities to better evaluate for arterial occlusive disease. Electronically Signed   By: Miachel Roux M.D.   On: 02/05/2022 10:00   DG Chest Portable 1 View  Result Date: 02/05/2022 CLINICAL DATA:  Shortness of breath. EXAM: PORTABLE CHEST 1 VIEW COMPARISON:  None Available. FINDINGS: Small bilateral pleural effusions with bibasilar atelectasis or infiltrate. No pneumothorax. Top-normal cardiac size. No acute osseous pathology. IMPRESSION: Small bilateral pleural effusions with bibasilar atelectasis or infiltrate. Electronically Signed   By: Anner Crete M.D.   On: 02/05/2022 02:55     Assessment and Plan:   HFrEF, EF 30 to 35% -Still extremely volume overloaded -Increase Lasix to 60 mg IV twice daily -Monitor ins and outs, daily weights, creatinine -Add GDMT when BP permits.  Blood pressure low normal. -Right and left heart cath when euvolemic  2. Elevated troponin -Likely demand supply mismatch -Ischemic work-up for cardiomyopathy as above.  3.  Abnormal EKG -EKG reviewed shows sinus tachycardia, not atrial flutter.  4.  Smoker -Decline nicotine patch -Cessation recommended   Total encounter time more than 50 minutes  Greater than 50% was spent in counseling and  coordination of care with the patient  Signed, Kate Sable, MD  02/07/2022 12:23 PM

## 2022-02-07 NOTE — Progress Notes (Addendum)
Progress Note    Lawrence Murray  ZCH:[id number] DOB: 31-Jul-1966  DOA: 02/05/2022 PCP: Pcp, No      Brief Narrative:    Medical records reviewed and are as summarized below:  Lawrence Murray is a 56 y.o. male who has not seen a doctor in many years, presented to the hospital because of shortness of breath and significant swelling in the lower extremities.       Assessment/Plan:   Principal Problem:   Acute CHF (congestive heart failure) (HCC) Active Problems:   Smoker   Type 2 diabetes mellitus with hyperglycemia (HCC)   Body mass index is 38.72 kg/m.  (Obesity)  Acute on chronic systolic CHF, anasarca: Continue IV Lasix.  Dose has been increased from 40 to 60 mg twice daily.  Monitor BMP, daily weight and urine output.  2D echo showed EF estimated at 30 to 28% but LV diastolic function cannot be evaluated.  Consulted cardiologist to assist with management.  Plan for right and left heart cath when euvolemic.  Elevated troponin: This is likely from demand ischemia.  Bilateral pleural effusions with adjacent atelectasis: Noted on repeat chest x-ray on 02/07/2022.  Continue IV diuresis.  Type II DM: Hemoglobin A1c 7.7.  NovoLog as needed for hyperglycemia.  Consider metformin at discharge.  Nondiagnostic ABI: Consider CTA of the lower extremities when euvolemic.  Tobacco use disorder: Counseled to quit smoking cigarettes.  Diet Order             Diet Heart Room service appropriate? Yes; Fluid consistency: Thin; Fluid restriction: 1200 mL Fluid  Diet effective now                            Consultants: Cardiologist  Procedures: None    Medications:    aspirin EC  81 mg Oral Daily   enoxaparin (LOVENOX) injection  0.5 mg/kg Subcutaneous Q24H   furosemide  60 mg Intravenous Q12H   insulin aspart  0-15 Units Subcutaneous TID WC   insulin aspart  0-5 Units Subcutaneous QHS   Continuous Infusions:   Anti-infectives (From admission, onward)     None              Family Communication/Anticipated D/C date and plan/Code Status   DVT prophylaxis:      Code Status: Full Code  Family Communication: None Disposition Plan: Plan to discharge home in 3 to 4 days   Status is: Inpatient Remains inpatient appropriate because: IV diuresis for CHF with anasarca       Subjective:   Interval events noted.  He complains of fatigue and general ill feeling.  He still has significant swelling in the legs.  He feels a little short of breath.  Objective:    Vitals:   02/07/22 0441 02/07/22 0723 02/07/22 0800 02/07/22 1139  BP: 99/74 110/71  102/77  Pulse:  98  (!) 102  Resp: '20 14  17  '$ Temp: 98.5 F (36.9 C) 98.6 F (37 C)  98.2 F (36.8 C)  TempSrc: Oral Oral    SpO2: 94% 92%  94%  Weight: 119.5 kg  122.4 kg   Height:       No data found.   Intake/Output Summary (Last 24 hours) at 02/07/2022 1623 Last data filed at 02/07/2022 1300 Gross per 24 hour  Intake 480 ml  Output 3155 ml  Net -2675 ml   Filed Weights   02/06/22 0500 02/07/22 0441 02/07/22 0800  Weight: (!) 145.2 kg 119.5 kg 122.4 kg    Exam:  GEN: NAD SKIN: Warm and dry EYES: EOMI ENT: MMM CV: RRR PULM: CTA B ABD: soft, distended, NT, +BS CNS: AAO x 3, non focal EXT: Bilateral lower extremity edema from the thighs to the feet, no tenderness       Data Reviewed:   I have personally reviewed following labs and imaging studies:  Labs: Labs show the following:   Basic Metabolic Panel: Recent Labs  Lab 02/05/22 0218 02/05/22 2153 02/06/22 1048 02/07/22 0618  NA 140 139 141 140  K 3.8 4.2 4.1 4.3  CL 102 100 99 97*  CO2 31 30 34* 35*  GLUCOSE 195* 159* 200* 145*  BUN '14 16 14 17  '$ CREATININE 1.01 0.96 0.92 0.99  CALCIUM 8.9 8.9 8.8* 8.9  MG  --  1.8  --   --    GFR Estimated Creatinine Clearance: 112 mL/min (by C-G formula based on SCr of 0.99 mg/dL). Liver Function Tests: Recent Labs  Lab 02/05/22 0218  AST 24   ALT 23  ALKPHOS 69  BILITOT 0.9  PROT 7.4  ALBUMIN 3.4*   No results for input(s): "LIPASE", "AMYLASE" in the last 168 hours. No results for input(s): "AMMONIA" in the last 168 hours. Coagulation profile No results for input(s): "INR", "PROTIME" in the last 168 hours.  CBC: Recent Labs  Lab 02/05/22 0218 02/05/22 2153  WBC 7.0 6.3  NEUTROABS 5.9 5.1  HGB 13.8 13.3  HCT 43.2 41.6  MCV 100.0 97.0  PLT 174 165   Cardiac Enzymes: No results for input(s): "CKTOTAL", "CKMB", "CKMBINDEX", "TROPONINI" in the last 168 hours. BNP (last 3 results) No results for input(s): "PROBNP" in the last 8760 hours. CBG: Recent Labs  Lab 02/06/22 1233 02/06/22 1634 02/06/22 2034 02/07/22 0855 02/07/22 1141  GLUCAP 202* 109* 194* 147* 189*   D-Dimer: No results for input(s): "DDIMER" in the last 72 hours. Hgb A1c: Recent Labs    02/05/22 2153  HGBA1C 7.7*   Lipid Profile: No results for input(s): "CHOL", "HDL", "LDLCALC", "TRIG", "CHOLHDL", "LDLDIRECT" in the last 72 hours. Thyroid function studies: Recent Labs    02/05/22 2153  TSH 9.011*   Anemia work up: No results for input(s): "VITAMINB12", "FOLATE", "FERRITIN", "TIBC", "IRON", "RETICCTPCT" in the last 72 hours. Sepsis Labs: Recent Labs  Lab 02/05/22 0218 02/05/22 2153  WBC 7.0 6.3    Microbiology No results found for this or any previous visit (from the past 240 hour(s)).  Procedures and diagnostic studies:  DG Chest 2 View  Result Date: 02/07/2022 CLINICAL DATA:  Pleural effusion EXAM: CHEST - 2 VIEW COMPARISON:  02/05/2022 FINDINGS: Persistent bilateral pleural effusions with adjacent atelectasis. Cardiomediastinal contours are partially obscured. IMPRESSION: Similar bilateral pleural effusions with adjacent atelectasis. Electronically Signed   By: Macy Mis M.D.   On: 02/07/2022 11:33   ECHOCARDIOGRAM COMPLETE  Result Date: 02/06/2022    ECHOCARDIOGRAM REPORT   Patient Name:   Lawrence Murray Date of  Exam: 02/06/2022 Medical Rec #:  [id number]    Height:       70.0 in Accession #:    [id number]   Weight:       320.0 lb Date of Birth:  [birth year]   BSA:          2.549 m Patient Age:    38 years     BP:           111/74 mmHg Patient Gender: M  HR:           93 bpm. Exam Location:  [facility] Procedure: 2D Echo and Cardiac Doppler Indications:     CHF I50.9  History:         Patient has no prior history of Echocardiogram examinations.  Sonographer:     Sherrie Sport Referring Phys:  [place] Diagnosing Phys: Donnelly Angelica  Sonographer Comments: Technically challenging study due to limited acoustic windows and no apical window. IMPRESSIONS  1. Left ventricular ejection fraction, by estimation, is 30 to 35%. The left ventricle has moderately decreased function. The left ventricle demonstrates global hypokinesis. Left ventricular diastolic function could not be evaluated.  2. RV not well visualized but likely enlarged.  3. Large pleural effusion.  4. The mitral valve is normal in structure. Mild mitral valve regurgitation.  5. The aortic valve is normal in structure. Aortic valve regurgitation is not visualized. Conclusion(s)/Recommendation(s): Limited study, very technically challenging. Likey left pleural effusion, less likely large posterior pericardial effusion. FINDINGS  Left Ventricle: Left ventricular ejection fraction, by estimation, is 30 to 35%. The left ventricle has moderately decreased function. The left ventricle demonstrates global hypokinesis. The left ventricular internal cavity size was normal in size. There is no left ventricular hypertrophy. Left ventricular diastolic function could not be evaluated. Right Ventricle: RV not well visualized but likely enlarged. Left Atrium: Left atrial size was not assessed. Right Atrium: Right atrial size was not assessed. Pericardium: There is no evidence of pericardial effusion. Mitral Valve: The mitral valve is normal in structure. Mild mitral valve  regurgitation. Tricuspid Valve: The tricuspid valve is normal in structure. Tricuspid valve regurgitation is mild. Aortic Valve: The aortic valve is normal in structure. Aortic valve regurgitation is not visualized. Pulmonic Valve: The pulmonic valve was not well visualized. Pulmonic valve regurgitation is not visualized. Aorta: The aortic root and ascending aorta are structurally normal, with no evidence of dilitation. Pulmonary Artery: Probably dilated, but not fully characterized. Additional Comments: There is a large pleural effusion.  LEFT VENTRICLE PLAX 2D LVIDd:         5.50 cm LVIDs:         4.00 cm LV PW:         0.90 cm LV IVS:        1.10 cm LVOT diam:     2.00 cm LVOT Area:     3.14 cm  LEFT ATRIUM         Index LA diam:    5.30 cm 2.08 cm/m   AORTA Ao Root diam: 3.33 cm  SHUNTS Systemic Diam: 2.00 cm Donnelly Angelica Electronically signed by Donnelly Angelica Signature Date/Time: 02/06/2022/2:02:33 PM    Final                LOS: 2 days   Lawrence Murray  [facility]   Pager on [email or web address]. If 7PM-7AM, please contact night-coverage at [email or web address]     02/07/2022, 4:23 PM

## 2022-02-08 DIAGNOSIS — I5021 Acute systolic (congestive) heart failure: Secondary | ICD-10-CM | POA: Diagnosis not present

## 2022-02-08 DIAGNOSIS — F17200 Nicotine dependence, unspecified, uncomplicated: Secondary | ICD-10-CM

## 2022-02-08 DIAGNOSIS — E1165 Type 2 diabetes mellitus with hyperglycemia: Secondary | ICD-10-CM | POA: Diagnosis not present

## 2022-02-08 DIAGNOSIS — I4892 Unspecified atrial flutter: Secondary | ICD-10-CM | POA: Diagnosis not present

## 2022-02-08 DIAGNOSIS — R739 Hyperglycemia, unspecified: Secondary | ICD-10-CM | POA: Diagnosis not present

## 2022-02-08 DIAGNOSIS — I420 Dilated cardiomyopathy: Secondary | ICD-10-CM | POA: Diagnosis not present

## 2022-02-08 DIAGNOSIS — I5043 Acute on chronic combined systolic (congestive) and diastolic (congestive) heart failure: Secondary | ICD-10-CM

## 2022-02-08 DIAGNOSIS — I248 Other forms of acute ischemic heart disease: Secondary | ICD-10-CM

## 2022-02-08 DIAGNOSIS — I739 Peripheral vascular disease, unspecified: Secondary | ICD-10-CM | POA: Diagnosis not present

## 2022-02-08 DIAGNOSIS — I25118 Atherosclerotic heart disease of native coronary artery with other forms of angina pectoris: Secondary | ICD-10-CM | POA: Diagnosis not present

## 2022-02-08 LAB — MAGNESIUM: Magnesium: 2 mg/dL (ref 1.7–2.4)

## 2022-02-08 LAB — GLUCOSE, CAPILLARY
Glucose-Capillary: 167 mg/dL — ABNORMAL HIGH (ref 70–99)
Glucose-Capillary: 169 mg/dL — ABNORMAL HIGH (ref 70–99)
Glucose-Capillary: 196 mg/dL — ABNORMAL HIGH (ref 70–99)
Glucose-Capillary: 148 mg/dL — ABNORMAL HIGH (ref 70–99)

## 2022-02-08 LAB — BASIC METABOLIC PANEL
Anion gap: 10 (ref 5–15)
BUN: 21 mg/dL — ABNORMAL HIGH (ref 6–20)
CO2: 33 mmol/L — ABNORMAL HIGH (ref 22–32)
Calcium: 8.9 mg/dL (ref 8.9–10.3)
Chloride: 95 mmol/L — ABNORMAL LOW (ref 98–111)
Creatinine, Ser: 0.99 mg/dL (ref 0.61–1.24)
GFR, Estimated: >60 mL/min (ref >60)
Glucose, Bld: 163 mg/dL — ABNORMAL HIGH (ref 70–99)
Potassium: 3.5 mmol/L (ref 3.5–5.1)
Sodium: 138 mmol/L (ref 135–145)

## 2022-02-08 MED ORDER — ACETAMINOPHEN 500 MG PO TABS
1000.0000 mg | ORAL_TABLET | Freq: Four times a day (QID) | ORAL | Status: DC | PRN
  Administered 2022-02-08 – 2022-02-28 (×17): 1000 mg via ORAL
  Filled 2022-02-08 (×17): qty 2

## 2022-02-08 MED ORDER — POTASSIUM CHLORIDE CRYS ER 20 MEQ PO TBCR: 40.0000 meq | EXTENDED_RELEASE_TABLET | Freq: Every day | ORAL | Status: DC

## 2022-02-08 MED ORDER — POTASSIUM CHLORIDE CRYS ER 20 MEQ PO TBCR
40.0000 meq | EXTENDED_RELEASE_TABLET | Freq: Every day | ORAL | Status: AC
  Administered 2022-02-08 – 2022-02-10 (×3): 40 meq via ORAL
  Filled 2022-02-08 (×3): qty 2

## 2022-02-08 NOTE — Progress Notes (Signed)
Progress Note  Patient Name: Lawrence Murray Date of Encounter: 02/08/2022  [facility] Cardiologist: None   Subjective   Pt states he is fatigued but is feeling better overall. He feels abdomen swelling has improved, less firm.   Inpatient Medications    Scheduled Meds:  aspirin EC  81 mg Oral Daily   enoxaparin (LOVENOX) injection  0.5 mg/kg Subcutaneous Q24H   furosemide  60 mg Intravenous Q12H   insulin aspart  0-15 Units Subcutaneous TID WC   insulin aspart  0-5 Units Subcutaneous QHS   QUEtiapine  25 mg Oral QHS   Continuous Infusions:  PRN Meds: acetaminophen, ALPRAZolam   Vital Signs    Vitals:   02/08/22 0012 02/08/22 0016 02/08/22 0128 02/08/22 0547  BP: (!) 95/57   97/67  Pulse:      Resp: 18   20  Temp: 99 F (37.2 C)   99.2 F (37.3 C)  TempSrc: Oral   Oral  SpO2: 92%   94%  Weight:  121.5 kg 121.5 kg   Height:        Intake/Output Summary (Last 24 hours) at 02/08/2022 0813 Last data filed at 02/08/2022 0630 Gross per 24 hour  Intake 1440 ml  Output 2510 ml  Net -1070 ml      02/08/2022    1:28 AM 02/08/2022   12:16 AM 02/07/2022    8:00 AM  Last 3 Weights  Weight (lbs) 267 lb 13.7 oz 267 lb 14.4 oz 269 lb 13.5 oz  Weight (kg) 121.5 kg 121.519 kg 122.4 kg      Telemetry    NSR, HR in the high 90s, occasional PVC - Personally Reviewed  ECG    No new readings - Personally Reviewed  Physical Exam   GEN: No acute distress.   Neck: UTA JVD d/t body habitus Cardiac: RRR, no murmurs, rubs, or gallops.  Respiratory: diminished lung sounds bilaterally. GI: Soft, nontender, distended MS: b/l LE edema wrapped in dressing; No deformity. Neuro:  Nonfocal  Psych: Normal affect   Labs    High Sensitivity Troponin:   Recent Labs  Lab 02/05/22 0218 02/05/22 0403  TROPONINIHS 370* 367*     Chemistry Recent Labs  Lab 02/05/22 0218 02/05/22 2153 02/06/22 1048 02/07/22 0618 02/08/22 0511  NA 140 139 141 140 138  K 3.8 4.2 4.1 4.3  3.5  CL 102 100 99 97* 95*  CO2 31 30 34* 35* 33*  GLUCOSE 195* 159* 200* 145* 163*  BUN '14 16 14 17 '$ 21*  CREATININE 1.01 0.96 0.92 0.99 0.99  CALCIUM 8.9 8.9 8.8* 8.9 8.9  MG  --  1.8  --   --  2.0  PROT 7.4  --   --   --   --   ALBUMIN 3.4*  --   --   --   --   AST 24  --   --   --   --   ALT 23  --   --   --   --   ALKPHOS 69  --   --   --   --   BILITOT 0.9  --   --   --   --   GFRNONAA >60 >60 >60 >60 >60  ANIONGAP '7 9 8 8 10    '$ Lipids No results for input(s): "CHOL", "TRIG", "HDL", "LABVLDL", "LDLCALC", "CHOLHDL" in the last 168 hours.  Hematology Recent Labs  Lab 02/05/22 0218 02/05/22 2153  WBC 7.0 6.3  RBC 4.32 4.29  HGB 13.8 13.3  HCT 43.2 41.6  MCV 100.0 97.0  MCH 31.9 31.0  MCHC 31.9 32.0  RDW 16.8* 16.7*  PLT 174 165   Thyroid  Recent Labs  Lab 02/05/22 2153  TSH 9.011*  FREET4 0.92    BNP Recent Labs  Lab 02/05/22 0218  BNP 919.6*    DDimer No results for input(s): "DDIMER" in the last 168 hours.   Radiology    DG Chest 2 View  Result Date: 02/07/2022 CLINICAL DATA:  Pleural effusion EXAM: CHEST - 2 VIEW COMPARISON:  02/05/2022 FINDINGS: Persistent bilateral pleural effusions with adjacent atelectasis. Cardiomediastinal contours are partially obscured. IMPRESSION: Similar bilateral pleural effusions with adjacent atelectasis. Electronically Signed   By: Macy Mis M.D.   On: 02/07/2022 11:33   ECHOCARDIOGRAM COMPLETE  Result Date: 02/06/2022    ECHOCARDIOGRAM REPORT   Patient Name:   Clancy Pidgeon Date of Exam: 02/06/2022 Medical Rec #:  [id number]    Height:       70.0 in Accession #:    [id number]   Weight:       320.0 lb Date of Birth:  October 04, 1965   BSA:          2.549 m Patient Age:    40 years     BP:           111/74 mmHg Patient Gender: M            HR:           93 bpm. Exam Location:  [facility] Procedure: 2D Echo and Cardiac Doppler Indications:     CHF I50.9  History:         Patient has no prior history of Echocardiogram examinations.   Sonographer:     Sherrie Sport Referring Phys:  [place] Diagnosing Phys: Donnelly Angelica  Sonographer Comments: Technically challenging study due to limited acoustic windows and no apical window. IMPRESSIONS  1. Left ventricular ejection fraction, by estimation, is 30 to 35%. The left ventricle has moderately decreased function. The left ventricle demonstrates global hypokinesis. Left ventricular diastolic function could not be evaluated.  2. RV not well visualized but likely enlarged.  3. Large pleural effusion.  4. The mitral valve is normal in structure. Mild mitral valve regurgitation.  5. The aortic valve is normal in structure. Aortic valve regurgitation is not visualized. Conclusion(s)/Recommendation(s): Limited study, very technically challenging. Likey left pleural effusion, less likely large posterior pericardial effusion. FINDINGS  Left Ventricle: Left ventricular ejection fraction, by estimation, is 30 to 35%. The left ventricle has moderately decreased function. The left ventricle demonstrates global hypokinesis. The left ventricular internal cavity size was normal in size. There is no left ventricular hypertrophy. Left ventricular diastolic function could not be evaluated. Right Ventricle: RV not well visualized but likely enlarged. Left Atrium: Left atrial size was not assessed. Right Atrium: Right atrial size was not assessed. Pericardium: There is no evidence of pericardial effusion. Mitral Valve: The mitral valve is normal in structure. Mild mitral valve regurgitation. Tricuspid Valve: The tricuspid valve is normal in structure. Tricuspid valve regurgitation is mild. Aortic Valve: The aortic valve is normal in structure. Aortic valve regurgitation is not visualized. Pulmonic Valve: The pulmonic valve was not well visualized. Pulmonic valve regurgitation is not visualized. Aorta: The aortic root and ascending aorta are structurally normal, with no evidence of dilitation. Pulmonary Artery:  Probably dilated, but not fully characterized. Additional Comments: There is a large pleural effusion.  LEFT VENTRICLE PLAX 2D LVIDd:         5.50 cm LVIDs:         4.00 cm LV PW:         0.90 cm LV IVS:        1.10 cm LVOT diam:     2.00 cm LVOT Area:     3.14 cm  LEFT ATRIUM         Index LA diam:    5.30 cm 2.08 cm/m   AORTA Ao Root diam: 3.33 cm  SHUNTS Systemic Diam: 2.00 cm Donnelly Angelica Electronically signed by Donnelly Angelica Signature Date/Time: 02/06/2022/2:02:33 PM    Final     Cardiac Studies   ECHO 6/20: IMPRESSIONS   1. Left ventricular ejection fraction, by estimation, is 30 to 35%. The  left ventricle has moderately decreased function. The left ventricle  demonstrates global hypokinesis. Left ventricular diastolic function could  not be evaluated.   2. RV not well visualized but likely enlarged.   3. Large pleural effusion.   4. The mitral valve is normal in structure. Mild mitral valve  regurgitation.   5. The aortic valve is normal in structure. Aortic valve regurgitation is  not visualized.  Conclusion(s)/Recommendation(s): Limited study, very technically  challenging. Likey left pleural effusion, less likely large posterior  pericardial effusion.   Patient Profile     56 y.o. male with a history of obesity and tobacco use who is new diagnosis of HFrEF with EF 30-35%.  Assessment & Plan    HFrEF -EF 30 to 35% on 6/20 Echo -Currently still fluid overloaded -Down -8.7 L since admission -BP limiting GDMT, current BP 97/67 this morning -Continue IV Lasix 60 mg BID -Creatinine 0.99 this am, continue to monitor -Strict I&O -Daily weights -R&L heart cath once euvolemic/he can lay flat  Elevated Troponin -Likely d/t demand ischemia, ischemic work up once euvolemic -Denies chest pain  Sinus tachycardia -Resolving, HR currently in 90's at rest -Continuous telemetry monitoring  Tobacco use -Denies nicotine replacement -Cessation education  For questions or updates,  please contact [facility] Please consult [email or web address] for contact info under     Signed, Tinna Kolker Ninfa Meeker, PA-C  02/08/2022, 8:13 AM

## 2022-02-08 NOTE — Progress Notes (Addendum)
Progress Note    Lawrence Murray  AST:[id number] DOB: [birth year]  DOA: 02/05/2022 PCP: Pcp, No      Brief Narrative:    Medical records reviewed and are as summarized below:  Lawrence Murray is a 56 y.o. male who has not seen a doctor in many years, presented to the hospital because of shortness of breath and significant swelling in the lower extremities.       Assessment/Plan:   Principal Problem:   Acute on chronic combined systolic and diastolic CHF (congestive heart failure) (HCC) Active Problems:   Smoker   Type 2 diabetes mellitus with hyperglycemia (HCC)   Body mass index is 38.43 kg/m.  (Obesity)  Acute on chronic systolic and diastolic CHF, anasarca: Net fluid loss of -9 L and weight loss from 320 pounds to 267 pounds thus far.  Unknown whether the weight is accurate.  Continue IV Lasix and monitor BMP, daily weight and urine output. 2D echo showed EF estimated at 30 to 98% but LV diastolic function cannot be evaluated.  Plan for right and left heart cath when he is euvolemic.  Follow-up with cardiologist.  Elevated troponin: This is likely from demand ischemia.  Bilateral pleural effusions with adjacent atelectasis: Noted on repeat chest x-ray on 02/07/2022.  Continue IV diuresis.  Type II DM: Hemoglobin A1c 7.7.  NovoLog as needed for hyperglycemia.  Consider metformin at discharge.  Nondiagnostic ABI: Consider CTA of the lower extremities when euvolemic.  Anxiety: Continue Seroquel and Xanax as needed.  Tobacco use disorder: Counseled to quit smoking cigarettes.  Diet Order             Diet Heart Room service appropriate? Yes; Fluid consistency: Thin; Fluid restriction: 1200 mL Fluid  Diet effective now                            Consultants: Cardiologist  Procedures: None    Medications:    aspirin EC  81 mg Oral Daily   enoxaparin (LOVENOX) injection  0.5 mg/kg Subcutaneous Q24H   furosemide  60 mg Intravenous Q12H    insulin aspart  0-15 Units Subcutaneous TID WC   insulin aspart  0-5 Units Subcutaneous QHS   potassium chloride  40 mEq Oral Daily   QUEtiapine  25 mg Oral QHS   Continuous Infusions:   Anti-infectives (From admission, onward)    None              Family Communication/Anticipated D/C date and plan/Code Status   DVT prophylaxis:      Code Status: Full Code  Family Communication: None Disposition Plan: Plan to discharge home in 3 to 4 days   Status is: Inpatient Remains inpatient appropriate because: IV diuresis for CHF with anasarca       Subjective:   He complains of shortness of breath but he thinks breathing is better.  He still has significant swelling in the legs.  He said Seroquel helps him at night.  Objective:    Vitals:   02/08/22 0824 02/08/22 1218 02/08/22 1255 02/08/22 1411  BP: 90/65 90/66  (!) 141/125  Pulse: 97 (!) 101  (!) 107  Resp:  17 (!) 22 16  Temp:  98.2 F (36.8 C)  98.9 F (37.2 C)  TempSrc:    Oral  SpO2:  98%  94%  Weight:      Height:       No data found.  Intake/Output Summary (Last 24 hours) at 02/08/2022 1537 Last data filed at 02/08/2022 1230 Gross per 24 hour  Intake 1200 ml  Output 2810 ml  Net -1610 ml   Filed Weights   02/07/22 0800 02/08/22 0016 02/08/22 0128  Weight: 122.4 kg 121.5 kg 121.5 kg    Exam:  GEN: NAD SKIN: Warm and dry EYES: EOMI ENT: MMM CV: RRR PULM: CTA B ABD: soft, distended, NT, +BS CNS: AAO x 3, non focal EXT: Significant bilateral lower extremity edema, no tenderness    GEN: NAD SKIN: Warm and dry EYES: EOMI ENT: MMM CV: RRR PULM: CTA B ABD: soft, distended, NT, +BS CNS: AAO x 3, non focal EXT: Bilateral lower extremity edema from the thighs to the feet, no tenderness       Data Reviewed:   I have personally reviewed following labs and imaging studies:  Labs: Labs show the following:   Basic Metabolic Panel: Recent Labs  Lab 02/05/22 0218  02/05/22 2153 02/06/22 1048 02/07/22 0618 02/08/22 0511  NA 140 139 141 140 138  K 3.8 4.2 4.1 4.3 3.5  CL 102 100 99 97* 95*  CO2 31 30 34* 35* 33*  GLUCOSE 195* 159* 200* 145* 163*  BUN '14 16 14 17 '$ 21*  CREATININE 1.01 0.96 0.92 0.99 0.99  CALCIUM 8.9 8.9 8.8* 8.9 8.9  MG  --  1.8  --   --  2.0   GFR Estimated Creatinine Clearance: 111.5 mL/min (by C-G formula based on SCr of 0.99 mg/dL). Liver Function Tests: Recent Labs  Lab 02/05/22 0218  AST 24  ALT 23  ALKPHOS 69  BILITOT 0.9  PROT 7.4  ALBUMIN 3.4*   No results for input(s): "LIPASE", "AMYLASE" in the last 168 hours. No results for input(s): "AMMONIA" in the last 168 hours. Coagulation profile No results for input(s): "INR", "PROTIME" in the last 168 hours.  CBC: Recent Labs  Lab 02/05/22 0218 02/05/22 2153  WBC 7.0 6.3  NEUTROABS 5.9 5.1  HGB 13.8 13.3  HCT 43.2 41.6  MCV 100.0 97.0  PLT 174 165   Cardiac Enzymes: No results for input(s): "CKTOTAL", "CKMB", "CKMBINDEX", "TROPONINI" in the last 168 hours. BNP (last 3 results) No results for input(s): "PROBNP" in the last 8760 hours. CBG: Recent Labs  Lab 02/07/22 1141 02/07/22 1749 02/07/22 2112 02/08/22 0824 02/08/22 1230  GLUCAP 189* 215* 150* 167* 169*   D-Dimer: No results for input(s): "DDIMER" in the last 72 hours. Hgb A1c: Recent Labs    02/05/22 2153  HGBA1C 7.7*   Lipid Profile: No results for input(s): "CHOL", "HDL", "LDLCALC", "TRIG", "CHOLHDL", "LDLDIRECT" in the last 72 hours. Thyroid function studies: Recent Labs    02/05/22 2153  TSH 9.011*   Anemia work up: No results for input(s): "VITAMINB12", "FOLATE", "FERRITIN", "TIBC", "IRON", "RETICCTPCT" in the last 72 hours. Sepsis Labs: Recent Labs  Lab 02/05/22 0218 02/05/22 2153  WBC 7.0 6.3    Microbiology No results found for this or any previous visit (from the past 240 hour(s)).  Procedures and diagnostic studies:  DG Chest 2 View  Result Date:  02/07/2022 CLINICAL DATA:  Pleural effusion EXAM: CHEST - 2 VIEW COMPARISON:  02/05/2022 FINDINGS: Persistent bilateral pleural effusions with adjacent atelectasis. Cardiomediastinal contours are partially obscured. IMPRESSION: Similar bilateral pleural effusions with adjacent atelectasis. Electronically Signed   By: Macy Mis M.D.   On: 02/07/2022 11:33               LOS: 3  days   Lawrence Murray  [facility]   Pager on [email or web address]. If 7PM-7AM, please contact night-coverage at [email or web address]     02/08/2022, 3:37 PM

## 2022-02-08 NOTE — Progress Notes (Addendum)
Pt is complaining of 5/10 body ache and have a temp of 99.2. NP Randol Kern made aware. Will continue to monitor.  Update 0606: NP Randol Kern placed order, Will continue to monitor.

## 2022-02-09 DIAGNOSIS — R739 Hyperglycemia, unspecified: Secondary | ICD-10-CM | POA: Diagnosis not present

## 2022-02-09 DIAGNOSIS — E1165 Type 2 diabetes mellitus with hyperglycemia: Secondary | ICD-10-CM

## 2022-02-09 DIAGNOSIS — I4892 Unspecified atrial flutter: Secondary | ICD-10-CM | POA: Diagnosis not present

## 2022-02-09 DIAGNOSIS — I5021 Acute systolic (congestive) heart failure: Secondary | ICD-10-CM | POA: Diagnosis not present

## 2022-02-09 DIAGNOSIS — I420 Dilated cardiomyopathy: Secondary | ICD-10-CM | POA: Diagnosis not present

## 2022-02-09 DIAGNOSIS — I739 Peripheral vascular disease, unspecified: Secondary | ICD-10-CM | POA: Diagnosis not present

## 2022-02-09 DIAGNOSIS — I5043 Acute on chronic combined systolic (congestive) and diastolic (congestive) heart failure: Principal | ICD-10-CM

## 2022-02-09 DIAGNOSIS — I25118 Atherosclerotic heart disease of native coronary artery with other forms of angina pectoris: Secondary | ICD-10-CM | POA: Diagnosis not present

## 2022-02-09 DIAGNOSIS — I248 Other forms of acute ischemic heart disease: Secondary | ICD-10-CM | POA: Diagnosis not present

## 2022-02-09 LAB — BLOOD GAS, ARTERIAL
Acid-Base Excess: 13.6 mmol/L — ABNORMAL HIGH (ref 0.0–2.0)
Bicarbonate: 38.4 mmol/L — ABNORMAL HIGH (ref 20.0–28.0)
O2 Content: 4 L/min
O2 Saturation: 97.6 %
Patient temperature: 37
pCO2 arterial: 47 mmHg (ref 32–48)
pH, Arterial: 7.52 — ABNORMAL HIGH (ref 7.35–7.45)
pO2, Arterial: 80 mmHg — ABNORMAL LOW (ref 83–108)

## 2022-02-09 LAB — CBC
HCT: 37 % — ABNORMAL LOW (ref 39.0–52.0)
Hemoglobin: 12.1 g/dL — ABNORMAL LOW (ref 13.0–17.0)
MCH: 31.1 pg (ref 26.0–34.0)
MCHC: 32.7 g/dL (ref 30.0–36.0)
MCV: 95.1 fL (ref 80.0–100.0)
Platelets: 171 10*3/uL (ref 150–400)
RBC: 3.89 MIL/uL — ABNORMAL LOW (ref 4.22–5.81)
RDW: 16.7 % — ABNORMAL HIGH (ref 11.5–15.5)
WBC: 8.8 10*3/uL (ref 4.0–10.5)
nRBC: 0 % (ref 0.0–0.2)

## 2022-02-09 LAB — GLUCOSE, CAPILLARY
Glucose-Capillary: 142 mg/dL — ABNORMAL HIGH (ref 70–99)
Glucose-Capillary: 193 mg/dL — ABNORMAL HIGH (ref 70–99)
Glucose-Capillary: 166 mg/dL — ABNORMAL HIGH (ref 70–99)
Glucose-Capillary: 188 mg/dL — ABNORMAL HIGH (ref 70–99)

## 2022-02-09 LAB — MAGNESIUM: Magnesium: 2 mg/dL (ref 1.7–2.4)

## 2022-02-09 LAB — BASIC METABOLIC PANEL
Anion gap: 8 (ref 5–15)
BUN: 21 mg/dL — ABNORMAL HIGH (ref 6–20)
CO2: 34 mmol/L — ABNORMAL HIGH (ref 22–32)
Calcium: 8.9 mg/dL (ref 8.9–10.3)
Chloride: 95 mmol/L — ABNORMAL LOW (ref 98–111)
Creatinine, Ser: 0.98 mg/dL (ref 0.61–1.24)
GFR, Estimated: >60 mL/min (ref >60)
Glucose, Bld: 144 mg/dL — ABNORMAL HIGH (ref 70–99)
Potassium: 4 mmol/L (ref 3.5–5.1)
Sodium: 137 mmol/L (ref 135–145)

## 2022-02-09 MED ORDER — ACETAZOLAMIDE SODIUM 500 MG IJ SOLR
250.0000 mg | Freq: Once | INTRAMUSCULAR | Status: AC
  Administered 2022-02-09: 250 mg via INTRAVENOUS
  Filled 2022-02-09: qty 250

## 2022-02-09 MED ORDER — STERILE WATER FOR INJECTION IJ SOLN
INTRAMUSCULAR | Status: AC
  Filled 2022-02-09: qty 10

## 2022-02-09 MED ORDER — EMPAGLIFLOZIN 10 MG PO TABS
10.0000 mg | ORAL_TABLET | Freq: Every day | ORAL | Status: DC
  Administered 2022-02-09 – 2022-03-01 (×20): 10 mg via ORAL
  Filled 2022-02-09 (×21): qty 1

## 2022-02-09 MED ORDER — FUROSEMIDE 10 MG/ML IJ SOLN
60.0000 mg | Freq: Two times a day (BID) | INTRAMUSCULAR | Status: DC
  Administered 2022-02-10: 60 mg via INTRAVENOUS
  Filled 2022-02-09: qty 6

## 2022-02-09 MED ORDER — FUROSEMIDE 10 MG/ML IJ SOLN: 60.0000 mg | Freq: Three times a day (TID) | INTRAMUSCULAR | Status: DC

## 2022-02-09 MED ORDER — PANTOPRAZOLE SODIUM 40 MG PO TBEC
40.0000 mg | DELAYED_RELEASE_TABLET | Freq: Every day | ORAL | Status: DC
  Administered 2022-02-09 – 2022-03-01 (×21): 40 mg via ORAL
  Filled 2022-02-09 (×21): qty 1

## 2022-02-09 MED ORDER — ALBUMIN HUMAN 25 % IV SOLN
25.0000 g | Freq: Once | INTRAVENOUS | Status: AC
  Administered 2022-02-09: 25 g via INTRAVENOUS
  Filled 2022-02-09: qty 100

## 2022-02-09 NOTE — Progress Notes (Signed)
Progress Note  Patient Name: Lawrence Murray Date of Encounter: 02/09/2022  [facility] Cardiologist: New  Subjective   UOP -1.7L. 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 No chest pain. Still volume up on exam. BP soft.   Inpatient Medications    Scheduled Meds:  aspirin EC  81 mg Oral Daily   enoxaparin (LOVENOX) injection  0.5 mg/kg Subcutaneous Q24H   furosemide  60 mg Intravenous Q12H   insulin aspart  0-15 Units Subcutaneous TID WC   insulin aspart  0-5 Units Subcutaneous QHS   potassium chloride  40 mEq Oral Daily   QUEtiapine  25 mg Oral QHS   Continuous Infusions:  PRN Meds: acetaminophen, ALPRAZolam   Vital Signs    Vitals:   02/09/22 0430 02/09/22 0500 02/09/22 0527 02/09/22 0738  BP: 98/78   (!) 88/66  Pulse: (!) 104   97  Resp: 18   19  Temp: 98.7 F (37.1 C)   98.8 F (37.1 C)  TempSrc: Oral   Oral  SpO2: 95%  94% 97%  Weight:  121.3 kg    Height:        Intake/Output Summary (Last 24 hours) at 02/09/2022 0821 Last data filed at 02/09/2022 0600 Gross per 24 hour  Intake 480 ml  Output 1400 ml  Net -920 ml      02/09/2022    5:00 AM 02/08/2022    1:28 AM 02/08/2022   12:16 AM  Last 3 Weights  Weight (lbs) 267 lb 6.7 oz 267 lb 13.7 oz 267 lb 14.4 oz  Weight (kg) 121.3 kg 121.5 kg 121.519 kg      Telemetry    NSR/ST, HR 90-110 - Personally Reviewed  ECG    No new - Personally Reviewed  Physical Exam   GEN: No acute distress.   Neck: No JVD Cardiac: RRR, no murmurs, rubs, or gallops.  Respiratory: diminished at bases GI: Soft, nontender, non-distended  MS: 2+ lower leg edema; No deformity. Neuro:  Nonfocal  Psych: Normal affect   Labs    High Sensitivity Troponin:   Recent Labs  Lab 02/05/22 0218 02/05/22 0403  TROPONINIHS 370* 367*     Chemistry Recent Labs  Lab 02/05/22 0218 02/05/22 2153 02/06/22 1048 02/07/22 0618 02/08/22 0511 02/09/22 0636  NA 140 139   < > 140 138 137  K 3.8 4.2    < > 4.3 3.5 4.0  CL 102 100   < > 97* 95* 95*  CO2 31 30   < > 35* 33* 34*  GLUCOSE 195* 159*   < > 145* 163* 144*  BUN 14 16   < > 17 21* 21*  CREATININE 1.01 0.96   < > 0.99 0.99 0.98  CALCIUM 8.9 8.9   < > 8.9 8.9 8.9  MG  --  1.8  --   --  2.0 2.0  PROT 7.4  --   --   --   --   --   ALBUMIN 3.4*  --   --   --   --   --   AST 24  --   --   --   --   --   ALT 23  --   --   --   --   --   ALKPHOS 69  --   --   --   --   --   BILITOT 0.9  --   --   --   --   --  GFRNONAA >60 >60   < > >60 >60 >60  ANIONGAP 7 9   < > 8 10 8    < > = values in this interval not displayed.    Lipids No results for input(s): "CHOL", "TRIG", "HDL", "LABVLDL", "LDLCALC", "CHOLHDL" in the last 168 hours.  Hematology Recent Labs  Lab 02/05/22 0218 02/05/22 2153 02/09/22 0636  WBC 7.0 6.3 8.8  RBC 4.32 4.29 3.89*  HGB 13.8 13.3 12.1*  HCT 43.2 41.6 37.0*  MCV 100.0 97.0 95.1  MCH 31.9 31.0 31.1  MCHC 31.9 32.0 32.7  RDW 16.8* 16.7* 16.7*  PLT 174 165 171   Thyroid  Recent Labs  Lab 02/05/22 2153  TSH 9.011*  FREET4 0.92    BNP Recent Labs  Lab 02/05/22 0218  BNP 919.6*    DDimer No results for input(s): "DDIMER" in the last 168 hours.   Radiology    DG Chest 2 View  Result Date: 02/07/2022 CLINICAL DATA:  Pleural effusion EXAM: CHEST - 2 VIEW COMPARISON:  02/05/2022 FINDINGS: Persistent bilateral pleural effusions with adjacent atelectasis. Cardiomediastinal contours are partially obscured. IMPRESSION: Similar bilateral pleural effusions with adjacent atelectasis. Electronically Signed   By: Guadlupe Spanish M.D.   On: 02/07/2022 11:33    Cardiac Studies     ECHO 6/20: IMPRESSIONS   1. Left ventricular ejection fraction, by estimation, is 30 to 35%. The  left ventricle has moderately decreased function. The left ventricle  demonstrates global hypokinesis. Left ventricular diastolic function could  not be evaluated.   2. RV not well visualized but likely enlarged.   3. Large  pleural effusion.   4. The mitral valve is normal in structure. Mild mitral valve  regurgitation.   5. The aortic valve is normal in structure. Aortic valve regurgitation is  not visualized.  Conclusion(s)/Recommendation(s): Limited study, very technically  challenging. Likey left pleural effusion, less likely large posterior  pericardial effusion.     Patient Profile     56 y.o. male with a history of obesity and tobacco use who is new diagnosis of HFrEF with EF 30-35%.  Assessment & Plan    New Cardiomyopathy Acute on chronic HFrEF -Echo this admission showed reduced EF 30 to 35% -Continue IV Lasix 60 mg BID -Down -10 L since admission -still fluid overloaded on exam -Creatinine 0.98 this am, continue to monitor -Low BP limiting GDMT - continue strict I&O, daily weights with diuresis -R&L heart cath once euvolemic/he can lay flat   Elevated Troponin -Denies chest pain - elevated troponin likely demand ischemia - ischemic work up once euvolemic for new cardiomyopathy as above - continue Aspirin  Sinus tachycardia -Improving, HR currently in 90's at rest -Continuous telemetry monitoring   Tobacco use -complete Cessation encouraged  For questions or updates, please contact [facility] Please consult [email or web address] for contact info under        Signed, Dearies Meikle Samin Stall, PA-C  02/09/2022, 8:21 AM

## 2022-02-10 DIAGNOSIS — I5043 Acute on chronic combined systolic (congestive) and diastolic (congestive) heart failure: Secondary | ICD-10-CM | POA: Diagnosis not present

## 2022-02-10 DIAGNOSIS — E1165 Type 2 diabetes mellitus with hyperglycemia: Secondary | ICD-10-CM | POA: Diagnosis not present

## 2022-02-10 LAB — BASIC METABOLIC PANEL
Anion gap: 8 (ref 5–15)
BUN: 19 mg/dL (ref 6–20)
CO2: 30 mmol/L (ref 22–32)
Calcium: 8.9 mg/dL (ref 8.9–10.3)
Chloride: 97 mmol/L — ABNORMAL LOW (ref 98–111)
Creatinine, Ser: 0.86 mg/dL (ref 0.61–1.24)
GFR, Estimated: >60 mL/min (ref >60)
Glucose, Bld: 113 mg/dL — ABNORMAL HIGH (ref 70–99)
Potassium: 3.7 mmol/L (ref 3.5–5.1)
Sodium: 135 mmol/L (ref 135–145)

## 2022-02-10 LAB — MAGNESIUM: Magnesium: 2 mg/dL (ref 1.7–2.4)

## 2022-02-10 LAB — GLUCOSE, CAPILLARY
Glucose-Capillary: 154 mg/dL — ABNORMAL HIGH (ref 70–99)
Glucose-Capillary: 135 mg/dL — ABNORMAL HIGH (ref 70–99)
Glucose-Capillary: 169 mg/dL — ABNORMAL HIGH (ref 70–99)
Glucose-Capillary: 160 mg/dL — ABNORMAL HIGH (ref 70–99)

## 2022-02-10 MED ORDER — ACETAZOLAMIDE SODIUM 500 MG IJ SOLR
500.0000 mg | Freq: Two times a day (BID) | INTRAMUSCULAR | Status: AC
  Administered 2022-02-10 (×2): 500 mg via INTRAVENOUS
  Filled 2022-02-10 (×2): qty 500

## 2022-02-10 MED ORDER — MIDODRINE HCL 5 MG PO TABS
2.5000 mg | ORAL_TABLET | Freq: Two times a day (BID) | ORAL | Status: DC
  Administered 2022-02-10 – 2022-02-23 (×27): 2.5 mg via ORAL
  Filled 2022-02-10 (×27): qty 1

## 2022-02-10 MED ORDER — FUROSEMIDE 10 MG/ML IJ SOLN
80.0000 mg | Freq: Three times a day (TID) | INTRAMUSCULAR | Status: DC
  Administered 2022-02-10 – 2022-02-11 (×3): 80 mg via INTRAVENOUS
  Filled 2022-02-10 (×3): qty 8

## 2022-02-10 MED ORDER — STERILE WATER FOR INJECTION IJ SOLN
INTRAMUSCULAR | Status: AC
  Filled 2022-02-10: qty 10

## 2022-02-11 ENCOUNTER — Inpatient Hospital Stay: Payer: Medicaid Other

## 2022-02-11 DIAGNOSIS — I5043 Acute on chronic combined systolic (congestive) and diastolic (congestive) heart failure: Secondary | ICD-10-CM | POA: Diagnosis not present

## 2022-02-11 DIAGNOSIS — E1165 Type 2 diabetes mellitus with hyperglycemia: Secondary | ICD-10-CM | POA: Diagnosis not present

## 2022-02-11 LAB — COMPREHENSIVE METABOLIC PANEL
ALT: 27 U/L (ref 0–44)
AST: 22 U/L (ref 15–41)
Albumin: 3.1 g/dL — ABNORMAL LOW (ref 3.5–5.0)
Alkaline Phosphatase: 78 U/L (ref 38–126)
Anion gap: 6 (ref 5–15)
BUN: 24 mg/dL — ABNORMAL HIGH (ref 6–20)
CO2: 30 mmol/L (ref 22–32)
Calcium: 8.9 mg/dL (ref 8.9–10.3)
Chloride: 99 mmol/L (ref 98–111)
Creatinine, Ser: 1.17 mg/dL (ref 0.61–1.24)
GFR, Estimated: >60 mL/min (ref >60)
Glucose, Bld: 140 mg/dL — ABNORMAL HIGH (ref 70–99)
Potassium: 3.9 mmol/L (ref 3.5–5.1)
Sodium: 135 mmol/L (ref 135–145)
Total Bilirubin: 1 mg/dL (ref 0.3–1.2)
Total Protein: 6.8 g/dL (ref 6.5–8.1)

## 2022-02-11 LAB — MAGNESIUM: Magnesium: 2.2 mg/dL (ref 1.7–2.4)

## 2022-02-11 LAB — GLUCOSE, CAPILLARY
Glucose-Capillary: 143 mg/dL — ABNORMAL HIGH (ref 70–99)
Glucose-Capillary: 186 mg/dL — ABNORMAL HIGH (ref 70–99)
Glucose-Capillary: 118 mg/dL — ABNORMAL HIGH (ref 70–99)
Glucose-Capillary: 162 mg/dL — ABNORMAL HIGH (ref 70–99)

## 2022-02-11 MED ORDER — ENOXAPARIN SODIUM 60 MG/0.6ML IJ SOSY
0.5000 mg/kg | PREFILLED_SYRINGE | INTRAMUSCULAR | Status: DC
  Administered 2022-02-12 – 2022-03-01 (×17): 57.5 mg via SUBCUTANEOUS
  Filled 2022-02-11 (×17): qty 0.6

## 2022-02-11 MED ORDER — HYDROMORPHONE HCL 1 MG/ML IJ SOLN
0.5000 mg | INTRAMUSCULAR | Status: AC | PRN
  Administered 2022-02-11: 0.5 mg via INTRAVENOUS
  Filled 2022-02-11: qty 1

## 2022-02-11 MED ORDER — IOHEXOL 350 MG/ML SOLN
100.0000 mL | Freq: Once | INTRAVENOUS | Status: AC | PRN
  Administered 2022-02-11: 100 mL via INTRAVENOUS

## 2022-02-12 DIAGNOSIS — I5043 Acute on chronic combined systolic (congestive) and diastolic (congestive) heart failure: Secondary | ICD-10-CM | POA: Diagnosis not present

## 2022-02-12 DIAGNOSIS — E1165 Type 2 diabetes mellitus with hyperglycemia: Secondary | ICD-10-CM | POA: Diagnosis not present

## 2022-02-12 DIAGNOSIS — I739 Peripheral vascular disease, unspecified: Secondary | ICD-10-CM

## 2022-02-12 DIAGNOSIS — I2510 Atherosclerotic heart disease of native coronary artery without angina pectoris: Secondary | ICD-10-CM | POA: Diagnosis not present

## 2022-02-12 DIAGNOSIS — I773 Arterial fibromuscular dysplasia: Secondary | ICD-10-CM

## 2022-02-12 DIAGNOSIS — I5021 Acute systolic (congestive) heart failure: Secondary | ICD-10-CM | POA: Diagnosis not present

## 2022-02-12 LAB — MAGNESIUM: Magnesium: 2.4 mg/dL (ref 1.7–2.4)

## 2022-02-12 LAB — CBC
HCT: 36.2 % — ABNORMAL LOW (ref 39.0–52.0)
Hemoglobin: 11.7 g/dL — ABNORMAL LOW (ref 13.0–17.0)
MCH: 31.2 pg (ref 26.0–34.0)
MCHC: 32.3 g/dL (ref 30.0–36.0)
MCV: 96.5 fL (ref 80.0–100.0)
Platelets: 193 10*3/uL (ref 150–400)
RBC: 3.75 MIL/uL — ABNORMAL LOW (ref 4.22–5.81)
RDW: 16.2 % — ABNORMAL HIGH (ref 11.5–15.5)
WBC: 6.2 10*3/uL (ref 4.0–10.5)
nRBC: 0 % (ref 0.0–0.2)

## 2022-02-12 LAB — BASIC METABOLIC PANEL
Anion gap: 9 (ref 5–15)
BUN: 24 mg/dL — ABNORMAL HIGH (ref 6–20)
CO2: 31 mmol/L (ref 22–32)
Calcium: 8.9 mg/dL (ref 8.9–10.3)
Chloride: 97 mmol/L — ABNORMAL LOW (ref 98–111)
Creatinine, Ser: 1.04 mg/dL (ref 0.61–1.24)
GFR, Estimated: >60 mL/min (ref >60)
Glucose, Bld: 125 mg/dL — ABNORMAL HIGH (ref 70–99)
Potassium: 3.6 mmol/L (ref 3.5–5.1)
Sodium: 137 mmol/L (ref 135–145)

## 2022-02-12 LAB — GLUCOSE, CAPILLARY
Glucose-Capillary: 132 mg/dL — ABNORMAL HIGH (ref 70–99)
Glucose-Capillary: 135 mg/dL — ABNORMAL HIGH (ref 70–99)
Glucose-Capillary: 148 mg/dL — ABNORMAL HIGH (ref 70–99)
Glucose-Capillary: 184 mg/dL — ABNORMAL HIGH (ref 70–99)

## 2022-02-12 MED ORDER — FUROSEMIDE 10 MG/ML IJ SOLN
80.0000 mg | Freq: Two times a day (BID) | INTRAMUSCULAR | Status: DC
  Administered 2022-02-12 – 2022-02-23 (×24): 80 mg via INTRAVENOUS
  Filled 2022-02-12 (×24): qty 8

## 2022-02-12 NOTE — H&P (View-Only) (Signed)
[facility] Vascular Consult Note  MRN : [id number]  Lawrence Murray is a 56 y.o. (October 18, 1965) male who presents with chief complaint of  Chief Complaint  Patient presents with   Leg Swelling  .   Consulting Physician: Jennye Boroughs, MD Reason for consult: Peripheral arterial disease with ulcerations History of Present Illness: Lawrence Murray is a 56 year old male who presented to [facility] with bilateral leg swelling, shortness of breath and abdominal distention.  The patient notes himself that he has not seen a doctor since he was a teenager.  He has healthy been progressively worsening since November.  He had severe swelling of both legs with blisters and oozing from them bilaterally.  The patient was found to have acute heart failure and has been diuresed since that time.  Due to the wound presentation on the patient's lower extremities underwent a CT angiogram, independently reviewed by myself.  The CT angiogram notes three-vessel runoff in the right lower extremity with distal occlusion of the anterior tibial artery.  The left lower extremity has one-vessel runoff.  The patient has had his legs wrapped and despite wound care and diuresis, blisters have persisted.  Currently the patient has had some soft blood pressures but it was also noted that the patient had some fibromuscular dysplasia present on CT and his renal arteries as well.  Current Facility-Administered Medications  Medication Dose Route Frequency Provider Last Rate Last Admin   acetaminophen (TYLENOL) tablet 1,000 mg  1,000 mg Oral Q6H PRN Sharion Settler, NP   1,000 mg at 02/12/22 2149   ALPRAZolam (XANAX) tablet 0.25 mg  0.25 mg Oral TID PRN Ralene Muskrat B, MD   0.25 mg at 02/12/22 1939   aspirin EC tablet 81 mg  81 mg Oral Daily Rise Patience, MD   81 mg at 02/12/22 1050   empagliflozin (JARDIANCE) tablet 10 mg  10 mg Oral Daily Minna Merritts, MD   10 mg at 02/12/22  1050   enoxaparin (LOVENOX) injection 57.5 mg  0.5 mg/kg Subcutaneous Q24H Lockie Mola B, RPH   57.5 mg at 02/12/22 1043   furosemide (LASIX) injection 80 mg  80 mg Intravenous BID End, Christopher, MD   80 mg at 02/12/22 1714   HYDROmorphone (DILAUDID) injection 0.5 mg  0.5 mg Intravenous Q4H PRN Jennye Boroughs, MD   0.5 mg at 02/11/22 1753   insulin aspart (novoLOG) injection 0-15 Units  0-15 Units Subcutaneous TID WC Ralene Muskrat B, MD   2 Units at 02/12/22 1714   insulin aspart (novoLOG) injection 0-5 Units  0-5 Units Subcutaneous QHS Sreenath, Sudheer B, MD       midodrine (PROAMATINE) tablet 2.5 mg  2.5 mg Oral BID WC Jennye Boroughs, MD   2.5 mg at 02/12/22 1714   pantoprazole (PROTONIX) EC tablet 40 mg  40 mg Oral Daily Jennye Boroughs, MD   40 mg at 02/12/22 1043   QUEtiapine (SEROQUEL) tablet 25 mg  25 mg Oral QHS Jennye Boroughs, MD   25 mg at 02/12/22 2144    History reviewed. No pertinent past medical history.  History reviewed. No pertinent surgical history.  Social History Social History   Tobacco Use   Smoking status: Former    Types: Cigarettes   Smokeless tobacco: Never  Substance Use Topics   Alcohol use: Never   Drug use: Never    Family History Family History  Problem Relation Age of Onset   Congestive Heart Failure Father  No Known Allergies   REVIEW OF SYSTEMS (Negative unless checked)  Constitutional: '[]'$ Weight loss  '[]'$ Fever  '[]'$ Chills Cardiac: '[]'$ Chest pain   '[]'$ Chest pressure   '[]'$ Palpitations   '[]'$ Shortness of breath when laying flat   '[]'$ Shortness of breath at rest   '[]'$ Shortness of breath with exertion. Vascular:  '[]'$ Pain in legs with walking   '[]'$ Pain in legs at rest   '[]'$ Pain in legs when laying flat   '[]'$ Claudication   '[]'$ Pain in feet when walking  '[]'$ Pain in feet at rest  '[]'$ Pain in feet when laying flat   '[]'$ History of DVT   '[]'$ Phlebitis   '[]'$ Swelling in legs   '[]'$ Varicose veins   '[]'$ Non-healing ulcers Pulmonary:   '[]'$ Uses home oxygen   '[]'$ Productive cough    '[]'$ Hemoptysis   '[]'$ Wheeze  '[]'$ COPD   '[]'$ Asthma Neurologic:  '[]'$ Dizziness  '[]'$ Blackouts   '[]'$ Seizures   '[]'$ History of stroke   '[]'$ History of TIA  '[]'$ Aphasia   '[]'$ Temporary blindness   '[]'$ Dysphagia   '[]'$ Weakness or numbness in arms   '[]'$ Weakness or numbness in legs Musculoskeletal:  '[]'$ Arthritis   '[]'$ Joint swelling   '[]'$ Joint pain   '[]'$ Low back pain Hematologic:  '[]'$ Easy bruising  '[]'$ Easy bleeding   '[]'$ Hypercoagulable state   '[]'$ Anemic  '[]'$ Hepatitis Gastrointestinal:  '[]'$ Blood in stool   '[]'$ Vomiting blood  '[]'$ Gastroesophageal reflux/heartburn   '[]'$ Difficulty swallowing. Genitourinary:  '[]'$ Chronic kidney disease   '[]'$ Difficult urination  '[]'$ Frequent urination  '[]'$ Burning with urination   '[]'$ Blood in urine Skin:  '[]'$ Rashes   '[]'$ Ulcers   '[]'$ Wounds Psychological:  '[]'$ History of anxiety   '[]'$  History of major depression.  Physical Examination  Vitals:   02/12/22 1313 02/12/22 1637 02/12/22 1638 02/12/22 1942  BP:  91/69 (!) 89/63 (!) 85/65  Pulse:  97 95 99  Resp:  17  16  Temp:  98.4 F (36.9 C)  98.3 F (36.8 C)  TempSrc:    Oral  SpO2:  96%  96%  Weight: 110.2 kg     Height:       Body mass index is 34.85 kg/m. Gen:  WD/WN, NAD Head: [place]/AT, No temporalis wasting. Prominent temp pulse not noted. Ear/Nose/Throat: Hearing grossly intact, nares w/o erythema or drainage, oropharynx w/o Erythema/Exudate Eyes: Sclera non-icteric, conjunctiva clear Neck: Trachea midline.  No JVD.  Pulmonary:  Good air movement, respirations not labored, equal bilaterally.  Cardiac: RRR, normal S1, S2. Vascular: Dopplerable pulses but difficult with dressings Vessel Right Left  Radial Palpable Palpable   Gastrointestinal: soft, non-tender/non-distended. No guarding/reflex.  Musculoskeletal: M/S 5/5 throughout.  Extremities without ischemic changes.  No deformity or atrophy.  2+ edema bilaterally Neurologic: Sensation grossly intact in extremities.  Symmetrical.  Speech is fluent. Motor exam as listed above. Psychiatric: Judgment intact,  Mood & affect appropriate for pt's clinical situation. Dermatologic: Blisters noted bilaterally Lymph : No Cervical, Axillary, or Inguinal lymphadenopathy.    CBC Lab Results  Component Value Date   WBC 6.2 02/12/2022   HGB 11.7 (L) 02/12/2022   HCT 36.2 (L) 02/12/2022   MCV 96.5 02/12/2022   PLT 193 02/12/2022    BMET    Component Value Date/Time   NA 137 02/12/2022 0613   K 3.6 02/12/2022 0613   CL 97 (L) 02/12/2022 0613   CO2 31 02/12/2022 0613   GLUCOSE 125 (H) 02/12/2022 0613   BUN 24 (H) 02/12/2022 0613   CREATININE 1.04 02/12/2022 0613   CALCIUM 8.9 02/12/2022 0613   GFRNONAA >60 02/12/2022 2202   Estimated Creatinine Clearance: 101 mL/min (by C-G formula based  on SCr of 1.04 mg/dL).  COAG No results found for: "INR", "PROTIME"  Radiology CT ANGIO AO+BIFEM W & OR WO CONTRAST  Result Date: 02/12/2022 CLINICAL DATA:  Peripheral arterial disease EXAM: CT ANGIOGRAPHY OF ABDOMINAL AORTA WITH ILIOFEMORAL RUNOFF TECHNIQUE: Multidetector CT imaging of the abdomen, pelvis and lower extremities was performed using the standard protocol during bolus administration of intravenous contrast. Multiplanar CT image reconstructions and MIPs were obtained to evaluate the vascular anatomy. RADIATION DOSE REDUCTION: This exam was performed according to the departmental dose-optimization program which includes automated exposure control, adjustment of the mA and/or kV according to patient size and/or use of iterative reconstruction technique. CONTRAST:  151m OMNIPAQUE IOHEXOL 350 MG/ML SOLN COMPARISON:  None Available. FINDINGS: VASCULAR Aorta: Mild mixed atherosclerotic plaque. No hemodynamically significant stenosis. No aneurysm or dissection. No periaortic inflammatory change. Celiac: Patent without evidence of aneurysm, dissection, vasculitis or significant stenosis. SMA: Patent without evidence of aneurysm, dissection, vasculitis or significant stenosis. Renals: Single renal arteries  bilaterally are widely patent. There is subtle mural irregularity involving the mid segments of the renal arteries bilaterally in keeping with changes of fibromuscular dysplasia. No aneurysm or dissection. IMA: Patent without evidence of aneurysm, dissection, vasculitis or significant stenosis. RIGHT Lower Extremity Inflow: Common, internal and external iliac arteries are patent without evidence of aneurysm, dissection, vasculitis or significant stenosis. Outflow: Common, superficial and profunda femoral arteries and the popliteal artery are patent without evidence of aneurysm, dissection, vasculitis or significant stenosis. Runoff: Patent three vessel runoff to the ankle. The dorsalis pedis artery occludes at the level of the anterior process of the talus. Posterior tibial artery continues to form the plantar arch. LEFT Lower Extremity Inflow: Common, internal and external iliac arteries are patent without evidence of aneurysm, dissection, vasculitis or significant stenosis. Outflow: Common, superficial and profunda femoral arteries and the popliteal artery are patent without evidence of aneurysm, dissection, vasculitis or significant stenosis. Runoff: Normal configuration of the runoff vasculature. The anterior tibial and peroneal arteries are widely patent proximally, but gradually terminates at the level of the distal diaphysis of the tibia. The posterior tibial artery terminates just beyond the medial malleolus Veins: No obvious venous abnormality within the limitations of this arterial phase study. Review of the MIP images confirms the above findings. NON-VASCULAR Lower chest: Mild cardiomegaly. Moderate to large bilateral pleural effusions are seen at the lung bases with associated compressive atelectasis of the lower lobes bilaterally Hepatobiliary: No focal liver abnormality is seen. No gallstones, gallbladder wall thickening, or biliary dilatation. Pancreas: Unremarkable Spleen: Unremarkable  Adrenals/Urinary Tract: Adrenal glands are unremarkable. Kidneys are normal, without renal calculi, focal lesion, or hydronephrosis. Bladder is unremarkable. Stomach/Bowel: Stomach is within normal limits. Appendix appears normal. No evidence of bowel wall thickening, distention, or inflammatory changes. Mild ascites. No free intraperitoneal gas. Lymphatic: No pathologic adenopathy within the abdomen and pelvis. Reproductive: Prostate is unremarkable. Other: Tiny fat containing umbilical and left inguinal hernias are present. There is diffuse subcutaneous body wall edema in keeping with anasarca. There is extensive subcutaneous edema diffusely involving the lower extremities bilaterally. Musculoskeletal: No acute bone abnormality. No lytic or blastic bone lesion. IMPRESSION: 1. Focal mural irregularity involving the mid segments of the renal arteries bilaterally in keeping with changes of fibromuscular dysplasia. No aneurysm or dissection. 2. Right lower extremity three-vessel runoff to the level of the ankle. Subsequently, occlusion of the dorsalis pedis artery at the level of the anterior process of the talus. Posterior tibial artery continues to form the plantar arch.  3. Left lower extremity runoff disease with single-vessel runoff to the ankle and with occlusion of the posterior tibial artery just beyond the medial malleolus. 4. Mild cardiomegaly. 5. Moderate to large bilateral pleural effusions with associated compressive atelectasis of the lower lobes bilaterally. Mild ascites, diffuse body wall edema, and extensive, diffuse bilateral lower extremity subcutaneous edema may reflect changes of cardiogenic failure and echocardiography may be helpful for further evaluation. Electronically Signed   By: Fidela Salisbury M.D.   On: 02/12/2022 03:33   DG Chest 2 View  Result Date: 02/07/2022 CLINICAL DATA:  Pleural effusion EXAM: CHEST - 2 VIEW COMPARISON:  02/05/2022 FINDINGS: Persistent bilateral pleural effusions  with adjacent atelectasis. Cardiomediastinal contours are partially obscured. IMPRESSION: Similar bilateral pleural effusions with adjacent atelectasis. Electronically Signed   By: Macy Mis M.D.   On: 02/07/2022 11:33   ECHOCARDIOGRAM COMPLETE  Result Date: 02/06/2022    ECHOCARDIOGRAM REPORT   Patient Name:   Lawrence Murray Date of Exam: 02/06/2022 Medical Rec #:  [id number]    Height:       70.0 in Accession #:    [id number]   Weight:       320.0 lb Date of Birth:  [birth year]   BSA:          2.549 m Patient Age:    66 years     BP:           111/74 mmHg Patient Gender: M            HR:           93 bpm. Exam Location:  [facility] Procedure: 2D Echo and Cardiac Doppler Indications:     CHF I50.9  History:         Patient has no prior history of Echocardiogram examinations.  Sonographer:     Sherrie Sport Referring Phys:  [place] Diagnosing Phys: Donnelly Angelica  Sonographer Comments: Technically challenging study due to limited acoustic windows and no apical window. IMPRESSIONS  1. Left ventricular ejection fraction, by estimation, is 30 to 35%. The left ventricle has moderately decreased function. The left ventricle demonstrates global hypokinesis. Left ventricular diastolic function could not be evaluated.  2. RV not well visualized but likely enlarged.  3. Large pleural effusion.  4. The mitral valve is normal in structure. Mild mitral valve regurgitation.  5. The aortic valve is normal in structure. Aortic valve regurgitation is not visualized. Conclusion(s)/Recommendation(s): Limited study, very technically challenging. Likey left pleural effusion, less likely large posterior pericardial effusion. FINDINGS  Left Ventricle: Left ventricular ejection fraction, by estimation, is 30 to 35%. The left ventricle has moderately decreased function. The left ventricle demonstrates global hypokinesis. The left ventricular internal cavity size was normal in size. There is no left ventricular hypertrophy. Left  ventricular diastolic function could not be evaluated. Right Ventricle: RV not well visualized but likely enlarged. Left Atrium: Left atrial size was not assessed. Right Atrium: Right atrial size was not assessed. Pericardium: There is no evidence of pericardial effusion. Mitral Valve: The mitral valve is normal in structure. Mild mitral valve regurgitation. Tricuspid Valve: The tricuspid valve is normal in structure. Tricuspid valve regurgitation is mild. Aortic Valve: The aortic valve is normal in structure. Aortic valve regurgitation is not visualized. Pulmonic Valve: The pulmonic valve was not well visualized. Pulmonic valve regurgitation is not visualized. Aorta: The aortic root and ascending aorta are structurally normal, with no evidence of dilitation. Pulmonary Artery: Probably dilated, but not fully characterized.  Additional Comments: There is a large pleural effusion.  LEFT VENTRICLE PLAX 2D LVIDd:         5.50 cm LVIDs:         4.00 cm LV PW:         0.90 cm LV IVS:        1.10 cm LVOT diam:     2.00 cm LVOT Area:     3.14 cm  LEFT ATRIUM         Index LA diam:    5.30 cm 2.08 cm/m   AORTA Ao Root diam: 3.33 cm  SHUNTS Systemic Diam: 2.00 cm Donnelly Angelica Electronically signed by Donnelly Angelica Signature Date/Time: 02/06/2022/2:02:33 PM    Final    US Venous Img Lower Bilateral (DVT)  Result Date: 02/05/2022 CLINICAL DATA:  Pain and swelling EXAM: BILATERAL LOWER EXTREMITY VENOUS DOPPLER ULTRASOUND TECHNIQUE: Gray-scale sonography with graded compression, as well as color Doppler and duplex ultrasound were performed to evaluate the lower extremity deep venous systems from the level of the common femoral vein and including the common femoral, femoral, profunda femoral, popliteal and calf veins including the posterior tibial, peroneal and gastrocnemius veins when visible. The superficial great saphenous vein was also interrogated. Spectral Doppler was utilized to evaluate flow at rest and with distal  augmentation maneuvers in the common femoral, femoral and popliteal veins. COMPARISON:  None Available. FINDINGS: RIGHT LOWER EXTREMITY Common Femoral Vein: No evidence of thrombus. Normal compressibility, respiratory phasicity and response to augmentation. Saphenofemoral Junction: No evidence of thrombus. Normal compressibility and flow on color Doppler imaging. Profunda Femoral Vein: No evidence of thrombus. Normal compressibility and flow on color Doppler imaging. Femoral Vein: No evidence of thrombus. Normal compressibility, respiratory phasicity and response to augmentation. Popliteal Vein: No evidence of thrombus. Normal compressibility, respiratory phasicity and response to augmentation. Calf Veins: No evidence of thrombus. Normal compressibility and flow on color Doppler imaging. Superficial Great Saphenous Vein: No evidence of thrombus. Normal compressibility. Venous Reflux:  None. Other Findings:  There is edema in the subcutaneous plane. LEFT LOWER EXTREMITY Common Femoral Vein: No evidence of thrombus. Normal compressibility, respiratory phasicity and response to augmentation. Saphenofemoral Junction: No evidence of thrombus. Normal compressibility and flow on color Doppler imaging. Profunda Femoral Vein: No evidence of thrombus. Normal compressibility and flow on color Doppler imaging. Femoral Vein: No evidence of thrombus. Normal compressibility, respiratory phasicity and response to augmentation. Popliteal Vein: No evidence of thrombus. Normal compressibility, respiratory phasicity and response to augmentation. Calf Veins: No evidence of thrombus. Normal compressibility and flow on color Doppler imaging. Superficial Great Saphenous Vein: No evidence of thrombus. Normal compressibility. Venous Reflux:  None. Other Findings: There is edema in the subcutaneous plane without loculated fluid collections. IMPRESSION: No evidence of deep venous thrombosis in either lower extremity. Electronically Signed    By: Elmer Picker M.D.   On: 02/05/2022 10:06   US ARTERIAL ABI (SCREENING LOWER EXTREMITY)  Result Date: 02/05/2022 CLINICAL DATA:  Bilateral lower extremity pain and ulceration for 6 months EXAM: NONINVASIVE PHYSIOLOGIC VASCULAR STUDY OF BILATERAL LOWER EXTREMITIES TECHNIQUE: Evaluation of both lower extremities were performed at rest, including calculation of ankle-brachial indices with single level pressure measurements and doppler recording. COMPARISON:  None available FINDINGS: Right ABI:  1.45 Left ABI:  1.18 Right Lower Extremity:  Normal arterial waveforms at the ankle. Left Lower Extremity: Monophasic waveform seen in the posterior tibial and dorsalis pedis arteries. > 1.4 Non diagnostic secondary to incompressible vessel calcifications (medial arterial sclerosis  of [place]ckeberg) IMPRESSION: 1. Nondiagnostic evaluation of the right lower extremity due to super normal ABI value. 2. Monophasic waveforms in the left posterior tibial and dorsalis pedis arteries, suspicious for underlying arterial occlusive disease, despite normal ABI values. 3. Consider further evaluation with CT angiography of the lower extremities to better evaluate for arterial occlusive disease. Electronically Signed   By: Miachel Roux M.D.   On: 02/05/2022 10:00   DG Chest Portable 1 View  Result Date: 02/05/2022 CLINICAL DATA:  Shortness of breath. EXAM: PORTABLE CHEST 1 VIEW COMPARISON:  None Available. FINDINGS: Small bilateral pleural effusions with bibasilar atelectasis or infiltrate. No pneumothorax. Top-normal cardiac size. No acute osseous pathology. IMPRESSION: Small bilateral pleural effusions with bibasilar atelectasis or infiltrate. Electronically Signed   By: Anner Crete M.D.   On: 02/05/2022 02:55      Assessment/Plan 1.  Peripheral arterial disease with ulceration  The patient's right lower extremity has three-vessel runoff to the level of the ankle however the dorsalis pedis is occluded.  More  concerning the left lower extremity has a single-vessel runoff with occlusion of the posterior tibial artery.  The patient has multiple wounds and ulcerations bilaterally.  This was noted by CT angiogram and typically the tibial vessels are not well represented in CT angiogram.  Based on this and the concerning wounds and ulcerations we will plan on having the patient undergo lower extremity angiogram left lower extremity first as it is the worst.  If it is found to be patent we will try to evaluate the right lower extremity as well.  This procedure was discussed with the patient.  Risk benefits and alternatives were discussed.  The patient is agreeable to proceed.  We will plan on intervention on Wednesday.  2.  Fibromuscular dysplasia  Currently the patient is not having any blood pressure issues however he is also being heavily diuresed and this is likely affecting blood pressure.  We will plan on evaluating for possible renal stenosis with a renal artery duplex with the patient is discharged unless there becomes an abrupt change in his blood pressures.  The patient should also have a carotid artery duplex done as well as typically fibromuscular dysplasia affects both renal arteries and carotid arteries as well.  3. Smoker Given peripheral artery disease, absolute smoking cessation prior  Family Communication: No family at bedside  Thank you for allowing [place] to participate in the care of this patient.   Kris Hartmann, NP [facility] [phone] (Office Phone) [phone] (Office Fax)  02/12/2022 11:24 PM  Staff may message me via secure chat in [place]  but this may not receive immediate response,  please page for urgent matters!  Dictation software was used to generate the above note. Typos may occur and escape review, as with typed/written notes. Any error is purely unintentional.  Please contact me directly for clarity if needed.

## 2022-02-13 DIAGNOSIS — I739 Peripheral vascular disease, unspecified: Secondary | ICD-10-CM

## 2022-02-13 DIAGNOSIS — I5021 Acute systolic (congestive) heart failure: Secondary | ICD-10-CM | POA: Diagnosis not present

## 2022-02-13 DIAGNOSIS — I5043 Acute on chronic combined systolic (congestive) and diastolic (congestive) heart failure: Secondary | ICD-10-CM | POA: Diagnosis not present

## 2022-02-13 DIAGNOSIS — E1165 Type 2 diabetes mellitus with hyperglycemia: Secondary | ICD-10-CM | POA: Diagnosis not present

## 2022-02-13 DIAGNOSIS — I2510 Atherosclerotic heart disease of native coronary artery without angina pectoris: Secondary | ICD-10-CM | POA: Diagnosis not present

## 2022-02-13 DIAGNOSIS — I773 Arterial fibromuscular dysplasia: Secondary | ICD-10-CM

## 2022-02-13 LAB — GLUCOSE, CAPILLARY
Glucose-Capillary: 135 mg/dL — ABNORMAL HIGH (ref 70–99)
Glucose-Capillary: 195 mg/dL — ABNORMAL HIGH (ref 70–99)
Glucose-Capillary: 143 mg/dL — ABNORMAL HIGH (ref 70–99)
Glucose-Capillary: 194 mg/dL — ABNORMAL HIGH (ref 70–99)

## 2022-02-13 LAB — BASIC METABOLIC PANEL
Anion gap: 8 (ref 5–15)
Anion gap: 10 (ref 5–15)
BUN: 25 mg/dL — ABNORMAL HIGH (ref 6–20)
BUN: 25 mg/dL — ABNORMAL HIGH (ref 6–20)
CO2: 32 mmol/L (ref 22–32)
CO2: 32 mmol/L (ref 22–32)
Calcium: 9 mg/dL (ref 8.9–10.3)
Calcium: 9 mg/dL (ref 8.9–10.3)
Chloride: 97 mmol/L — ABNORMAL LOW (ref 98–111)
Chloride: 94 mmol/L — ABNORMAL LOW (ref 98–111)
Creatinine, Ser: 1.16 mg/dL (ref 0.61–1.24)
Creatinine, Ser: 1.16 mg/dL (ref 0.61–1.24)
GFR, Estimated: >60 mL/min (ref >60)
GFR, Estimated: >60 mL/min (ref >60)
Glucose, Bld: 124 mg/dL — ABNORMAL HIGH (ref 70–99)
Glucose, Bld: 183 mg/dL — ABNORMAL HIGH (ref 70–99)
Potassium: 3.7 mmol/L (ref 3.5–5.1)
Potassium: 4 mmol/L (ref 3.5–5.1)
Sodium: 137 mmol/L (ref 135–145)
Sodium: 136 mmol/L (ref 135–145)

## 2022-02-13 LAB — CBC
HCT: 39.4 % (ref 39.0–52.0)
Hemoglobin: 12.9 g/dL — ABNORMAL LOW (ref 13.0–17.0)
MCH: 31.2 pg (ref 26.0–34.0)
MCHC: 32.7 g/dL (ref 30.0–36.0)
MCV: 95.4 fL (ref 80.0–100.0)
Platelets: 240 10*3/uL (ref 150–400)
RBC: 4.13 MIL/uL — ABNORMAL LOW (ref 4.22–5.81)
RDW: 15.9 % — ABNORMAL HIGH (ref 11.5–15.5)
WBC: 7.8 10*3/uL (ref 4.0–10.5)
nRBC: 0 % (ref 0.0–0.2)

## 2022-02-13 LAB — MAGNESIUM: Magnesium: 2.4 mg/dL (ref 1.7–2.4)

## 2022-02-13 MED ORDER — MIDAZOLAM HCL 2 MG/ML PO SYRP: 8.0000 mg | ORAL_SOLUTION | Freq: Once | ORAL | Status: DC | PRN

## 2022-02-13 MED ORDER — HYDROMORPHONE HCL 1 MG/ML IJ SOLN
1.0000 mg | Freq: Once | INTRAMUSCULAR | Status: AC | PRN
  Administered 2022-02-13: 1 mg via INTRAVENOUS
  Filled 2022-02-13: qty 1

## 2022-02-13 MED ORDER — DIPHENHYDRAMINE HCL 50 MG/ML IJ SOLN: 50.0000 mg | Freq: Once | INTRAMUSCULAR | Status: DC | PRN

## 2022-02-13 MED ORDER — SODIUM CHLORIDE 0.9 % IV SOLN: INTRAVENOUS | Status: DC

## 2022-02-13 MED ORDER — FAMOTIDINE 20 MG PO TABS: 40.0000 mg | ORAL_TABLET | Freq: Once | ORAL | Status: DC | PRN

## 2022-02-13 MED ORDER — CEFAZOLIN SODIUM-DEXTROSE 2-4 GM/100ML-% IV SOLN
2.0000 g | INTRAVENOUS | Status: DC
  Filled 2022-02-13: qty 100

## 2022-02-13 MED ORDER — ONDANSETRON HCL 4 MG/2ML IJ SOLN: 4.0000 mg | Freq: Four times a day (QID) | INTRAMUSCULAR | Status: DC | PRN

## 2022-02-13 MED ORDER — METHYLPREDNISOLONE SODIUM SUCC 125 MG IJ SOLR: 125.0000 mg | Freq: Once | INTRAMUSCULAR | Status: DC | PRN

## 2022-02-13 NOTE — Progress Notes (Signed)
   Heart Failure Nurse Navigator Note  Met with patient today, he was sitting up in chair at bedside, on room air.  States he continues to urinate large amounts.  He states his legs also continue to weep.  When questioned he states he has not looked at the heart failure info he was given.  Still requests to wait to closer to discharge.  Also discussed that he does not have insurance, states someone talked with him last week about medicaid vs applying for disability but has not heard anything back.  Spent over 30 minutes talking with patient. He had not further questions.  Tresa Endo RN [facility]

## 2022-02-14 ENCOUNTER — Encounter: Payer: Self-pay | Admitting: Vascular Surgery

## 2022-02-14 ENCOUNTER — Encounter
Admission: EM | Disposition: A | Discharge status: Home / Self Care | Payer: Self-pay | Source: Home / Self Care | Attending: Internal Medicine

## 2022-02-14 DIAGNOSIS — L97919 Non-pressure chronic ulcer of unspecified part of right lower leg with unspecified severity: Secondary | ICD-10-CM

## 2022-02-14 DIAGNOSIS — I5021 Acute systolic (congestive) heart failure: Secondary | ICD-10-CM | POA: Diagnosis not present

## 2022-02-14 DIAGNOSIS — I70249 Atherosclerosis of native arteries of left leg with ulceration of unspecified site: Secondary | ICD-10-CM

## 2022-02-14 DIAGNOSIS — I509 Heart failure, unspecified: Secondary | ICD-10-CM | POA: Diagnosis not present

## 2022-02-14 DIAGNOSIS — I5043 Acute on chronic combined systolic (congestive) and diastolic (congestive) heart failure: Secondary | ICD-10-CM | POA: Diagnosis not present

## 2022-02-14 DIAGNOSIS — L97929 Non-pressure chronic ulcer of unspecified part of left lower leg with unspecified severity: Secondary | ICD-10-CM

## 2022-02-14 DIAGNOSIS — I70239 Atherosclerosis of native arteries of right leg with ulceration of unspecified site: Secondary | ICD-10-CM | POA: Diagnosis not present

## 2022-02-14 HISTORY — PX: LOWER EXTREMITY ANGIOGRAPHY: CATH118251

## 2022-02-14 LAB — GLUCOSE, CAPILLARY
Glucose-Capillary: 146 mg/dL — ABNORMAL HIGH (ref 70–99)
Glucose-Capillary: 118 mg/dL — ABNORMAL HIGH (ref 70–99)
Glucose-Capillary: 222 mg/dL — ABNORMAL HIGH (ref 70–99)
Glucose-Capillary: 188 mg/dL — ABNORMAL HIGH (ref 70–99)

## 2022-02-14 LAB — BASIC METABOLIC PANEL
Anion gap: 11 (ref 5–15)
BUN: 29 mg/dL — ABNORMAL HIGH (ref 6–20)
CO2: 30 mmol/L (ref 22–32)
Calcium: 9.4 mg/dL (ref 8.9–10.3)
Chloride: 96 mmol/L — ABNORMAL LOW (ref 98–111)
Creatinine, Ser: 1.05 mg/dL (ref 0.61–1.24)
GFR, Estimated: >60 mL/min (ref >60)
Glucose, Bld: 165 mg/dL — ABNORMAL HIGH (ref 70–99)
Potassium: 4.2 mmol/L (ref 3.5–5.1)
Sodium: 137 mmol/L (ref 135–145)

## 2022-02-14 LAB — MAGNESIUM: Magnesium: 2.4 mg/dL (ref 1.7–2.4)

## 2022-02-14 SURGERY — LOWER EXTREMITY ANGIOGRAPHY
Anesthesia: Moderate Sedation | Laterality: Left

## 2022-02-14 MED ORDER — HYDROMORPHONE HCL 1 MG/ML IJ SOLN
1.0000 mg | Freq: Once | INTRAMUSCULAR | Status: AC | PRN
  Administered 2022-02-14: 1 mg via INTRAVENOUS
  Filled 2022-02-14: qty 1

## 2022-02-14 MED ORDER — MIDAZOLAM HCL 2 MG/2ML IJ SOLN
INTRAMUSCULAR | Status: DC | PRN
  Administered 2022-02-14: 2 mg via INTRAVENOUS

## 2022-02-14 MED ORDER — CEFAZOLIN SODIUM-DEXTROSE 1-4 GM/50ML-% IV SOLN
INTRAVENOUS | Status: AC | PRN
  Administered 2022-02-14: 2 g via INTRAVENOUS

## 2022-02-14 MED ORDER — SODIUM CHLORIDE 0.9 % IV SOLN: INTRAVENOUS | Status: DC

## 2022-02-14 MED ORDER — CEFAZOLIN SODIUM-DEXTROSE 2-4 GM/100ML-% IV SOLN
2.0000 g | INTRAVENOUS | Status: DC
  Filled 2022-02-14: qty 100

## 2022-02-14 MED ORDER — METHYLPREDNISOLONE SODIUM SUCC 125 MG IJ SOLR: 125.0000 mg | Freq: Once | INTRAMUSCULAR | Status: DC | PRN

## 2022-02-14 MED ORDER — ONDANSETRON HCL 4 MG/2ML IJ SOLN: 4.0000 mg | Freq: Four times a day (QID) | INTRAMUSCULAR | Status: DC | PRN

## 2022-02-14 MED ORDER — HEPARIN SODIUM (PORCINE) 1000 UNIT/ML IJ SOLN
INTRAMUSCULAR | Status: AC
  Filled 2022-02-14: qty 10

## 2022-02-14 MED ORDER — FENTANYL CITRATE PF 50 MCG/ML IJ SOSY
PREFILLED_SYRINGE | INTRAMUSCULAR | Status: AC
  Filled 2022-02-14: qty 2

## 2022-02-14 MED ORDER — DIPHENHYDRAMINE HCL 50 MG/ML IJ SOLN: 50.0000 mg | Freq: Once | INTRAMUSCULAR | Status: DC | PRN

## 2022-02-14 MED ORDER — GABAPENTIN 600 MG PO TABS
300.0000 mg | ORAL_TABLET | Freq: Three times a day (TID) | ORAL | Status: DC
  Administered 2022-02-14 – 2022-02-15 (×3): 300 mg via ORAL
  Filled 2022-02-14 (×3): qty 1

## 2022-02-14 MED ORDER — FENTANYL CITRATE (PF) 100 MCG/2ML IJ SOLN
INTRAMUSCULAR | Status: DC | PRN
Start: 2022-02-14 — End: 2022-02-14
  Administered 2022-02-14: 50 ug via INTRAVENOUS

## 2022-02-14 MED ORDER — MIDAZOLAM HCL 2 MG/2ML IJ SOLN
INTRAMUSCULAR | Status: AC
  Filled 2022-02-14: qty 4

## 2022-02-14 MED ORDER — MIDAZOLAM HCL 2 MG/ML PO SYRP
8.0000 mg | ORAL_SOLUTION | Freq: Once | ORAL | Status: DC | PRN
Start: 2022-02-14 — End: 2022-02-14

## 2022-02-14 MED ORDER — IODIXANOL 320 MG/ML IV SOLN
INTRAVENOUS | Status: DC | PRN
  Administered 2022-02-14: 45 mL

## 2022-02-14 MED ORDER — FAMOTIDINE 20 MG PO TABS
40.0000 mg | ORAL_TABLET | Freq: Once | ORAL | Status: DC | PRN
Start: 2022-02-14 — End: 2022-02-14

## 2022-02-14 SURGICAL SUPPLY — 9 items
CATH ANGIO 5F PIGTAIL 65CM (CATHETERS) ×1 IMPLANT
COVER DRAPE FLUORO 36X44 (DRAPES) ×1 IMPLANT
COVER PROBE U/S 5X48 (MISCELLANEOUS) ×1 IMPLANT
DEVICE STARCLOSE SE CLOSURE (Vascular Products) ×1 IMPLANT
PACK ANGIOGRAPHY (CUSTOM PROCEDURE TRAY) ×2 IMPLANT
SHEATH BRITE TIP 5FRX11 (SHEATH) ×2 IMPLANT
SYR MEDRAD MARK 7 150ML (SYRINGE) ×1 IMPLANT
TUBING CONTRAST HIGH PRESS 72 (TUBING) ×1 IMPLANT
WIRE GUIDERIGHT .035X150 (WIRE) ×1 IMPLANT

## 2022-02-14 NOTE — Progress Notes (Signed)
Progress Note  Patient Name: Lawrence Murray Date of Encounter: 02/14/2022  [facility] Cardiologist: None   Subjective   Shortness of breath is improving, swelling is also improving.  Net -3 L over the past 24 hours.  Inpatient Medications    Scheduled Meds:  aspirin EC  81 mg Oral Daily   empagliflozin  10 mg Oral Daily   enoxaparin (LOVENOX) injection  0.5 mg/kg Subcutaneous Q24H   furosemide  80 mg Intravenous BID   insulin aspart  0-15 Units Subcutaneous TID WC   insulin aspart  0-5 Units Subcutaneous QHS   midodrine  2.5 mg Oral BID WC   pantoprazole  40 mg Oral Daily   QUEtiapine  25 mg Oral QHS   Continuous Infusions:  sodium chloride     sodium chloride      ceFAZolin (ANCEF) IV     PRN Meds: acetaminophen, ALPRAZolam, diphenhydrAMINE, diphenhydrAMINE, famotidine, famotidine, HYDROmorphone (DILAUDID) injection, methylPREDNISolone (SOLU-MEDROL) injection, methylPREDNISolone (SOLU-MEDROL) injection, midazolam, ondansetron (ZOFRAN) IV, ondansetron (ZOFRAN) IV   Vital Signs    Vitals:   02/13/22 2122 02/13/22 2353 02/14/22 0526 02/14/22 0821  BP: (!) 96/58 (!) 89/58 110/77 95/67  Pulse:  88 96 92  Resp:  '19 17 17  '$ Temp:  97.6 F (36.4 C) 97.9 F (36.6 C) 97.9 F (36.6 C)  TempSrc:  Oral    SpO2:  99% 99% 92%  Weight:   106.8 kg   Height:        Intake/Output Summary (Last 24 hours) at 02/14/2022 1103 Last data filed at 02/14/2022 4196 Gross per 24 hour  Intake --  Output 2050 ml  Net -2050 ml      02/14/2022    5:26 AM 02/13/2022    3:53 AM 02/12/2022    1:13 PM  Last 3 Weights  Weight (lbs) 235 lb 6.4 oz 238 lb 11.2 oz 242 lb 14.4 oz  Weight (kg) 106.777 kg 108.274 kg 110.179 kg      Telemetry    Sinus rhythm- Personally Reviewed  ECG     - Personally Reviewed  Physical Exam   GEN: No acute distress.   Neck: No JVD Cardiac: RRR, no murmurs, rubs, or gallops.  Respiratory: Diminished breath sounds bilaterally GI: Soft, nontender,  non-distended  MS: 2+ edema; No deformity. Neuro:  Nonfocal  Psych: Normal affect   Labs    High Sensitivity Troponin:   Recent Labs  Lab 02/05/22 0218 02/05/22 0403  TROPONINIHS 370* 367*     Chemistry Recent Labs  Lab 02/11/22 0603 02/12/22 0613 02/13/22 0623 02/13/22 1151 02/14/22 0613  NA 135 137 137 136 137  K 3.9 3.6 3.7 4.0 4.2  CL 99 97* 97* 94* 96*  CO2 30 31 32 32 30  GLUCOSE 140* 125* 124* 183* 165*  BUN 24* 24* 25* 25* 29*  CREATININE 1.17 1.04 1.16 1.16 1.05  CALCIUM 8.9 8.9 9.0 9.0 9.4  MG 2.2 2.4 2.4  --  2.4  PROT 6.8  --   --   --   --   ALBUMIN 3.1*  --   --   --   --   AST 22  --   --   --   --   ALT 27  --   --   --   --   ALKPHOS 78  --   --   --   --   BILITOT 1.0  --   --   --   --  GFRNONAA >60 >60 >60 >60 >60  ANIONGAP '6 9 8 10 11    '$ Lipids No results for input(s): "CHOL", "TRIG", "HDL", "LABVLDL", "LDLCALC", "CHOLHDL" in the last 168 hours.  Hematology Recent Labs  Lab 02/09/22 0636 02/12/22 0613 02/13/22 1151  WBC 8.8 6.2 7.8  RBC 3.89* 3.75* 4.13*  HGB 12.1* 11.7* 12.9*  HCT 37.0* 36.2* 39.4  MCV 95.1 96.5 95.4  MCH 31.1 31.2 31.2  MCHC 32.7 32.3 32.7  RDW 16.7* 16.2* 15.9*  PLT 171 193 240   Thyroid No results for input(s): "TSH", "FREET4" in the last 168 hours.  BNPNo results for input(s): "BNP", "PROBNP" in the last 168 hours.  DDimer No results for input(s): "DDIMER" in the last 168 hours.   Radiology    No results found.  Cardiac Studies   Echo EF 30 to 35%  Patient Profile     56 y.o. male history of obesity, smoker x20+ years presenting with shortness of breath and edema, being seen for acute HFrEF  Assessment & Plan    HFrEF EF 30 to 35% -Still volume overload -Net -3 L over the past 24 hours -Continue Lasix 80 mg IV twice daily -Midodrine 2.5 mg 3 times daily, Jardiance 10 mg daily -Right and left heart cath when euvolemic likely early next week -Low blood pressures preventing use of GDMT  2.   Tobacco use -Cessation recommended   Total encounter time more than 50 minutes  Greater than 50% was spent in counseling and coordination of care with the patient     Signed, Kate Sable, MD  02/14/2022, 11:03 AM

## 2022-02-14 NOTE — Op Note (Signed)
[facility]  Percutaneous Study/Intervention Procedural Note   Date of Surgery: 02/14/2022  Surgeon(s):Lutricia Widjaja    Assistants:none  Pre-operative Diagnosis: Ulceration bilateral lower extremities  Post-operative diagnosis:  Same  Procedure(s) Performed:             1.  Ultrasound guidance for vascular access right femoral artery             2.  Catheter placement into left common femoral artery from right femoral approach             3.  Aortogram and selective left lower extremity angiogram             4.  StarClose closure device right femoral artery  EBL: 3 cc  Contrast: 50 cc  Fluoro Time: 0.8 minutes  Moderate Conscious Sedation Time: approximately 15 minutes using 2 mg of Versed and 50 mcg of Fentanyl              Indications:  Patient is a 56 y.o.male with nonhealing ulcerations of both lower extremities. The patient has a CT scan suggesting some degree of tibial disease although this is obviously suboptimal imaging for evaluation of that.  The patient is brought in for angiography for further evaluation and potential treatment.  Due to the limb threatening nature of the situation, angiogram was performed for attempted limb salvage. The patient is aware that if the procedure fails, amputation would be expected.  The patient also understands that even with successful revascularization, amputation may still be required due to the severity of the situation.  Risks and benefits are discussed and informed consent is obtained.   Procedure:  The patient was identified and appropriate procedural time out was performed.  The patient was then placed supine on the table and prepped and draped in the usual sterile fashion. Moderate conscious sedation was administered during a face to face encounter with the patient throughout the procedure with my supervision of the RN administering medicines and monitoring the patient's vital signs, pulse oximetry, telemetry and  mental status throughout from the start of the procedure until the patient was taken to the recovery room. Ultrasound was used to evaluate the right common femoral artery.  It was patent .  A digital ultrasound image was acquired.  A Seldinger needle was used to access the right common femoral artery under direct ultrasound guidance and a permanent image was performed.  A 0.035 J wire was advanced without resistance and a 5Fr sheath was placed.  Pigtail catheter was placed into the aorta and an AP aortogram was performed. This demonstrated normal renal arteries and normal aorta and iliac segments without significant stenosis. I then crossed the aortic bifurcation and advanced to the left femoral head in the left common femoral artery. Selective left lower extremity angiogram was then performed. This demonstrated normal common femoral artery, profunda femoris artery was small but patent.  The SFA and popliteal arteries were widely patent.  There was a normal tibial trifurcation and three-vessel runoff distally with the posterior tibial artery being the dominant flow into the foot.  With ulcers on the right foot, I elected to go ahead and image the right lower extremity as well.  This was done through the right femoral sheath.  Imaging demonstrated normal common femoral artery, small but patent profunda femoris artery, SFA and popliteal arteries are widely patent.  The posterior tibial artery was again the dominant runoff but there were no focal stenosis in all 3 tibial vessels.  I elected to terminate the procedure. The sheath was removed and StarClose closure device was deployed in the right femoral artery with excellent hemostatic result. The patient was taken to the recovery room in stable condition having tolerated the procedure well.  Findings:               Aortogram:  This demonstrated normal renal arteries and normal aorta and iliac segments without significant stenosis.             Left Lower Extremity:  This demonstrated normal common femoral artery, profunda femoris artery was small but patent.  The SFA and popliteal arteries were widely patent.  There was a normal tibial trifurcation and three-vessel runoff distally with the posterior tibial artery being the dominant flow into the foot  Right Lower Extremity:  Imaging demonstrated normal common femoral artery, small but patent profunda femoris artery, SFA and popliteal arteries are widely patent.  The posterior tibial artery was again the dominant runoff but there were no focal stenosis in all 3 tibial vessels.    Disposition: Patient was taken to the recovery room in stable condition having tolerated the procedure well.  Complications: None  Leotis Pain 02/14/2022 12:26 PM   This note was created with Dragon Medical transcription system. Any errors in dictation are purely unintentional.

## 2022-02-14 NOTE — Progress Notes (Signed)
PROGRESS NOTE    Greyson Peavy  HAL:[id number] DOB: 04/17/1966 DOA: 02/05/2022 PCP: Pcp, No    Brief Narrative:   56 y.o. male who has not seen a doctor in many years, presented to the hospital because of shortness of breath and significant swelling in the lower extremities.   He was admitted to the hospital for acute on chronic systolic and diastolic CHF with anasarca.  2D echo showed estimated EF of 30 to 73% but LV diastolic function could not be evaluated.  He was treated with IV Lasix.  BP was soft so midodrine was started to allow for adequate diuresis.  Troponin was elevated but this was attributed to demand ischemia.  Cardiologist recommended left and right heart cath once he is euvolemic.  There was concern for peripheral arterial disease.  Patient underwent angiogram with vascular surgery on 6/28.  Good arterial flow noted.  No intervention was indicated.  Volume status is markedly improved.  As of 6/28 patient is 21 L net negative.  Symptomatically improved.   Assessment & Plan:   Principal Problem:   Acute on chronic combined systolic and diastolic CHF (congestive heart failure) (HCC) Active Problems:   Smoker   Type 2 diabetes mellitus with hyperglycemia (HCC)   PVD (peripheral vascular disease) (HCC)   Acute HFrEF (heart failure with reduced ejection fraction) (HCC)  Acute on chronic systolic and diastolic congestive heart failure Anasarca 2D echocardiogram EF 30 to 53% Diastolic dysfunction indeterminate, suspect some element of diastolic dysfunction 21 L net negative since admission Plan: Lasix 80 mg IV twice daily Daily renal function Midodrine 2.5 3 times daily Left and right heart cath early next week when more euvolemic  Hypotension BP remains soft Continue midodrine  Elevated troponin Likely secondary demand ischemia No indication of ACS  Bilateral pleural effusions Likely secondary to decompensated heart failure Patient on room air, clear  lungs  Type 2 diabetes mellitus Hemoglobin A1c 7.7 SSI with NovoLog Continue empagliflozin We will consider metformin at discharge  Peripheral vascular disease Lower extremity angiogram reassuring  Anxiety Seroquel as needed as needed Xanax  Tobacco use disorder Counseled patient  DVT prophylaxis: Lovenox Code Status: Full Family Communication: None today Disposition Plan: Status is: Inpatient Remains inpatient appropriate because: Acute decompensated systolic and diastolic congestive heart failure on IV diuresis.  Will likely need right and left heart catheterization prior to discharge.   Level of care: Telemetry Cardiac  Consultants:  Cardiology Vascular surgery  Procedures:  Lower extremity angiography 6/28  Antimicrobials: None   Subjective: Seen and examined.  Sitting up in chair.  No visible distress.  Answer questions appropriately.  Does complain of pain in lower extremity.  Objective: Vitals:   02/14/22 1231 02/14/22 1245 02/14/22 1300 02/14/22 1340  BP: 1'02/73 96/74 95/71 '$ 98/73  Pulse: 95 93 87   Resp: 19 (!) '21 19 18  '$ Temp:    98 F (36.7 C)  TempSrc:    Oral  SpO2: 92% 95% 97% 100%  Weight:      Height:        Intake/Output Summary (Last 24 hours) at 02/14/2022 1638 Last data filed at 02/14/2022 1300 Gross per 24 hour  Intake --  Output 1950 ml  Net -1950 ml   Filed Weights   02/12/22 1313 02/13/22 0353 02/14/22 0526  Weight: 110.2 kg 108.3 kg 106.8 kg    Examination:  General exam: NAD Respiratory system: Bibasilar crackles.  Normal work of breathing.  Room air Cardiovascular system: S1-S2, RRR, no  murmurs, 2+ pedal edema bilateral Gastrointestinal system: Soft, NT/ND, normal bowel sounds Central nervous system: Alert and oriented. No focal neurological deficits. Extremities: Symmetric 5 x 5 power. Skin: No rashes, lesions or ulcers Psychiatry: Judgement and insight appear normal. Mood & affect appropriate.     Data Reviewed: I  have personally reviewed following labs and imaging studies  CBC: Recent Labs  Lab 02/09/22 0636 02/12/22 0613 02/13/22 1151  WBC 8.8 6.2 7.8  HGB 12.1* 11.7* 12.9*  HCT 37.0* 36.2* 39.4  MCV 95.1 96.5 95.4  PLT 171 193 829   Basic Metabolic Panel: Recent Labs  Lab 02/10/22 0547 02/11/22 0603 02/12/22 0613 02/13/22 0623 02/13/22 1151 02/14/22 0613  NA 135 135 137 137 136 137  K 3.7 3.9 3.6 3.7 4.0 4.2  CL 97* 99 97* 97* 94* 96*  CO2 '30 30 31 '$ 32 32 30  GLUCOSE 113* 140* 125* 124* 183* 165*  BUN 19 24* 24* 25* 25* 29*  CREATININE 0.86 1.17 1.04 1.16 1.16 1.05  CALCIUM 8.9 8.9 8.9 9.0 9.0 9.4  MG 2.0 2.2 2.4 2.4  --  2.4   GFR: Estimated Creatinine Clearance: 98.4 mL/min (by C-G formula based on SCr of 1.05 mg/dL). Liver Function Tests: Recent Labs  Lab 02/11/22 0603  AST 22  ALT 27  ALKPHOS 78  BILITOT 1.0  PROT 6.8  ALBUMIN 3.1*   No results for input(s): "LIPASE", "AMYLASE" in the last 168 hours. No results for input(s): "AMMONIA" in the last 168 hours. Coagulation Profile: No results for input(s): "INR", "PROTIME" in the last 168 hours. Cardiac Enzymes: No results for input(s): "CKTOTAL", "CKMB", "CKMBINDEX", "TROPONINI" in the last 168 hours. BNP (last 3 results) No results for input(s): "PROBNP" in the last 8760 hours. HbA1C: No results for input(s): "HGBA1C" in the last 72 hours. CBG: Recent Labs  Lab 02/13/22 1204 02/13/22 1706 02/13/22 2108 02/14/22 0821 02/14/22 1107  GLUCAP 195* 143* 194* 146* 118*   Lipid Profile: No results for input(s): "CHOL", "HDL", "LDLCALC", "TRIG", "CHOLHDL", "LDLDIRECT" in the last 72 hours. Thyroid Function Tests: No results for input(s): "TSH", "T4TOTAL", "FREET4", "T3FREE", "THYROIDAB" in the last 72 hours. Anemia Panel: No results for input(s): "VITAMINB12", "FOLATE", "FERRITIN", "TIBC", "IRON", "RETICCTPCT" in the last 72 hours. Sepsis Labs: No results for input(s): "PROCALCITON", "LATICACIDVEN" in the  last 168 hours.  No results found for this or any previous visit (from the past 240 hour(s)).       Radiology Studies: PERIPHERAL VASCULAR CATHETERIZATION  Result Date: 02/14/2022 See surgical note for result.       Scheduled Meds:  aspirin EC  81 mg Oral Daily   empagliflozin  10 mg Oral Daily   enoxaparin (LOVENOX) injection  0.5 mg/kg Subcutaneous Q24H   fentaNYL       furosemide  80 mg Intravenous BID   insulin aspart  0-15 Units Subcutaneous TID WC   insulin aspart  0-5 Units Subcutaneous QHS   midazolam       midodrine  2.5 mg Oral BID WC   pantoprazole  40 mg Oral Daily   QUEtiapine  25 mg Oral QHS   Continuous Infusions:   LOS: 9 days      Sidney Ace, MD [facility]   If 7PM-7AM, please contact night-coverage  02/14/2022, 4:38 PM

## 2022-02-14 NOTE — Interval H&P Note (Signed)
History and Physical Interval Note:  02/14/2022 11:37 AM  Lawrence Murray  has presented today for surgery, with the diagnosis of Peripheral arterial disease with ulceration.  The various methods of treatment have been discussed with the patient and family. After consideration of risks, benefits and other options for treatment, the patient has consented to  Procedure(s): Lower Extremity Angiography (Left) as a surgical intervention.  The patient's history has been reviewed, patient examined, no change in status, stable for surgery.  I have reviewed the patient's chart and labs.  Questions were answered to the patient's satisfaction.     Leotis Pain

## 2022-02-15 DIAGNOSIS — I5021 Acute systolic (congestive) heart failure: Secondary | ICD-10-CM | POA: Diagnosis not present

## 2022-02-15 DIAGNOSIS — I509 Heart failure, unspecified: Secondary | ICD-10-CM | POA: Diagnosis not present

## 2022-02-15 DIAGNOSIS — I2510 Atherosclerotic heart disease of native coronary artery without angina pectoris: Secondary | ICD-10-CM | POA: Diagnosis not present

## 2022-02-15 DIAGNOSIS — I5043 Acute on chronic combined systolic (congestive) and diastolic (congestive) heart failure: Secondary | ICD-10-CM | POA: Diagnosis not present

## 2022-02-15 LAB — GLUCOSE, CAPILLARY
Glucose-Capillary: 170 mg/dL — ABNORMAL HIGH (ref 70–99)
Glucose-Capillary: 219 mg/dL — ABNORMAL HIGH (ref 70–99)
Glucose-Capillary: 168 mg/dL — ABNORMAL HIGH (ref 70–99)
Glucose-Capillary: 321 mg/dL — ABNORMAL HIGH (ref 70–99)

## 2022-02-15 LAB — BASIC METABOLIC PANEL
Anion gap: 11 (ref 5–15)
BUN: 28 mg/dL — ABNORMAL HIGH (ref 6–20)
CO2: 30 mmol/L (ref 22–32)
Calcium: 9.2 mg/dL (ref 8.9–10.3)
Chloride: 95 mmol/L — ABNORMAL LOW (ref 98–111)
Creatinine, Ser: 1.09 mg/dL (ref 0.61–1.24)
GFR, Estimated: >60 mL/min (ref >60)
Glucose, Bld: 150 mg/dL — ABNORMAL HIGH (ref 70–99)
Potassium: 3.9 mmol/L (ref 3.5–5.1)
Sodium: 136 mmol/L (ref 135–145)

## 2022-02-15 MED ORDER — GABAPENTIN 400 MG PO CAPS
400.0000 mg | ORAL_CAPSULE | Freq: Three times a day (TID) | ORAL | Status: DC
  Administered 2022-02-15 – 2022-02-17 (×6): 400 mg via ORAL
  Filled 2022-02-15 (×6): qty 1

## 2022-02-15 NOTE — Progress Notes (Signed)
Progress Note  Patient Name: Lawrence Murray Date of Encounter: 02/15/2022  [facility] Cardiologist: None   Subjective   Shortness of breath continues to improve.  Ins and outs are Not adequately measured, lost 2 to 3 pounds over the past 24 hours.  Lower extremity angiogram yesterday with no peripheral arterial disease.  Inpatient Medications    Scheduled Meds:  aspirin EC  81 mg Oral Daily   empagliflozin  10 mg Oral Daily   enoxaparin (LOVENOX) injection  0.5 mg/kg Subcutaneous Q24H   furosemide  80 mg Intravenous BID   gabapentin  300 mg Oral TID   insulin aspart  0-15 Units Subcutaneous TID WC   insulin aspart  0-5 Units Subcutaneous QHS   midodrine  2.5 mg Oral BID WC   pantoprazole  40 mg Oral Daily   QUEtiapine  25 mg Oral QHS   Continuous Infusions:   PRN Meds: acetaminophen, ALPRAZolam, ondansetron (ZOFRAN) IV, ondansetron (ZOFRAN) IV   Vital Signs    Vitals:   02/14/22 2336 02/15/22 0333 02/15/22 0729 02/15/22 1139  BP: (!) '96/55 94/62 92/67 '$ (!) 94/55  Pulse: 98 89 92 88  Resp: '16 17 14 16  '$ Temp: 98.7 F (37.1 C) 98.4 F (36.9 C) 98.2 F (36.8 C) 98.9 F (37.2 C)  TempSrc: Oral Oral Oral   SpO2: 94% 94% 97% 99%  Weight:  105.3 kg    Height:        Intake/Output Summary (Last 24 hours) at 02/15/2022 1302 Last data filed at 02/15/2022 1023 Gross per 24 hour  Intake 1440 ml  Output 1900 ml  Net -460 ml      02/15/2022    3:33 AM 02/14/2022    5:26 AM 02/13/2022    3:53 AM  Last 3 Weights  Weight (lbs) 232 lb 1.6 oz 235 lb 6.4 oz 238 lb 11.2 oz  Weight (kg) 105.28 kg 106.777 kg 108.274 kg      Telemetry    Sinus rhythm- Personally Reviewed  ECG     - Personally Reviewed  Physical Exam   GEN: No acute distress.   Neck: No JVD Cardiac: RRR, no murmurs, rubs, or gallops.  Respiratory: Diminished breath sounds bilaterally GI: Soft, nontender, non-distended  MS: 2+ edema; No deformity. Neuro:  Nonfocal  Psych: Normal affect   Labs     High Sensitivity Troponin:   Recent Labs  Lab 02/05/22 0218 02/05/22 0403  TROPONINIHS 370* 367*     Chemistry Recent Labs  Lab 02/11/22 0603 02/12/22 7829 02/13/22 0623 02/13/22 1151 02/14/22 0613 02/15/22 0817  NA 135 137 137 136 137 136  K 3.9 3.6 3.7 4.0 4.2 3.9  CL 99 97* 97* 94* 96* 95*  CO2 30 31 32 32 30 30  GLUCOSE 140* 125* 124* 183* 165* 150*  BUN 24* 24* 25* 25* 29* 28*  CREATININE 1.17 1.04 1.16 1.16 1.05 1.09  CALCIUM 8.9 8.9 9.0 9.0 9.4 9.2  MG 2.2 2.4 2.4  --  2.4  --   PROT 6.8  --   --   --   --   --   ALBUMIN 3.1*  --   --   --   --   --   AST 22  --   --   --   --   --   ALT 27  --   --   --   --   --   ALKPHOS 78  --   --   --   --   --  BILITOT 1.0  --   --   --   --   --   GFRNONAA >60 >60 >60 >60 >60 >60  ANIONGAP '6 9 8 10 11 11    '$ Lipids No results for input(s): "CHOL", "TRIG", "HDL", "LABVLDL", "LDLCALC", "CHOLHDL" in the last 168 hours.  Hematology Recent Labs  Lab 02/09/22 0636 02/12/22 0613 02/13/22 1151  WBC 8.8 6.2 7.8  RBC 3.89* 3.75* 4.13*  HGB 12.1* 11.7* 12.9*  HCT 37.0* 36.2* 39.4  MCV 95.1 96.5 95.4  MCH 31.1 31.2 31.2  MCHC 32.7 32.3 32.7  RDW 16.7* 16.2* 15.9*  PLT 171 193 240   Thyroid No results for input(s): "TSH", "FREET4" in the last 168 hours.  BNPNo results for input(s): "BNP", "PROBNP" in the last 168 hours.  DDimer No results for input(s): "DDIMER" in the last 168 hours.   Radiology    PERIPHERAL VASCULAR CATHETERIZATION  Result Date: 02/14/2022 See surgical note for result.   Cardiac Studies   Echo EF 30 to 35%  Patient Profile     56 y.o. male history of obesity, smoker x20+ years presenting with shortness of breath and edema, being seen for acute HFrEF  Assessment & Plan    HFrEF EF 30 to 35% -Still volume overload -Ins and outs not accurately measured -Lost 2-3 pounds over the past 24 hours -Continue Lasix 80 mg IV twice daily -Midodrine 2.5 mg 3 times daily, Jardiance 10 mg  daily -Right and left heart cath when euvolemic likely early next week -Low blood pressures preventing use of GDMT  2.  Tobacco use -Cessation recommended   Total encounter time more than 50 minutes  Greater than 50% was spent in counseling and coordination of care with the patient     Signed, Kate Sable, MD  02/15/2022, 1:02 PM

## 2022-02-15 NOTE — Progress Notes (Signed)
PROGRESS NOTE    Lawrence Murray  VPX:[id number] DOB: 09-05-1965 DOA: 02/05/2022 PCP: Pcp, No    Brief Narrative:   56 y.o. male who has not seen a doctor in many years, presented to the hospital because of shortness of breath and significant swelling in the lower extremities.   He was admitted to the hospital for acute on chronic systolic and diastolic CHF with anasarca.  2D echo showed estimated EF of 30 to 46% but LV diastolic function could not be evaluated.  He was treated with IV Lasix.  BP was soft so midodrine was started to allow for adequate diuresis.  Troponin was elevated but this was attributed to demand ischemia.  Cardiologist recommended left and right heart cath once he is euvolemic.  There was concern for peripheral arterial disease.  Patient underwent angiogram with vascular surgery on 6/28.  Good arterial flow noted.  No intervention was indicated.  Volume status is markedly improved.  As of 6/28 patient is 21 L net negative.  Symptomatically improved.   Assessment & Plan:   Principal Problem:   Acute on chronic combined systolic and diastolic CHF (congestive heart failure) (HCC) Active Problems:   Smoker   Type 2 diabetes mellitus with hyperglycemia (HCC)   PVD (peripheral vascular disease) (HCC)   Acute HFrEF (heart failure with reduced ejection fraction) (HCC)  Acute on chronic systolic and diastolic congestive heart failure Anasarca 2D echocardiogram EF 30 to 27% Diastolic dysfunction indeterminate, suspect some element of diastolic dysfunction 22 L net negative since admission Plan: Lasix 80 mg IV twice daily Daily renal function, creatinine remained stable Continue midodrine 2.5 3 times daily Left and right heart cath early next week when more euvolemic  Hypotension BP remains soft Continue midodrine Hold GDMT  Elevated troponin Likely secondary demand ischemia No indication of ACS  Bilateral pleural effusions Likely secondary to decompensated  heart failure Patient on room air, clear lungs  Type 2 diabetes mellitus Hemoglobin A1c 7.7 SSI with NovoLog Continue empagliflozin We will consider metformin at discharge  Peripheral vascular disease Lower extremity angiogram reassuring  Anxiety Seroquel as needed as needed Xanax  Tobacco use disorder Counseled patient  DVT prophylaxis: Lovenox Code Status: Full Family Communication: None today Disposition Plan: Status is: Inpatient Remains inpatient appropriate because: Acute decompensated heart failure on IV diuretics.  Remains anasarcic   Level of care: Telemetry Cardiac  Consultants:  Cardiology Vascular surgery  Procedures:  Lower extremity angiography 6/28  Antimicrobials: None   Subjective: Seen and examined.  Sitting up in chair.  No visible distress.  Answer questions appropriately.  Does complain of pain in lower extremity.  Objective: Vitals:   02/14/22 1936 02/14/22 2336 02/15/22 0333 02/15/22 0729  BP: 101/67 (!) '96/55 94/62 92/67 '$  Pulse: 100 98 89 92  Resp: '19 16 17 14  '$ Temp: 98.5 F (36.9 C) 98.7 F (37.1 C) 98.4 F (36.9 C) 98.2 F (36.8 C)  TempSrc: Oral Oral Oral Oral  SpO2: 97% 94% 94% 97%  Weight:   105.3 kg   Height:        Intake/Output Summary (Last 24 hours) at 02/15/2022 1137 Last data filed at 02/15/2022 1023 Gross per 24 hour  Intake 1440 ml  Output 2400 ml  Net -960 ml   Filed Weights   02/13/22 0353 02/14/22 0526 02/15/22 0333  Weight: 108.3 kg 106.8 kg 105.3 kg    Examination:  General exam: NAD.  Appears chronically ill Respiratory system: Bibasilar crackles.  Normal work of breathing.  Room air Cardiovascular system: S1-S2, RRR, no murmurs, 2+ pedal edema BLE Gastrointestinal system: Soft, NT/ND, normal bowel sounds Central nervous system: Alert and oriented. No focal neurological deficits. Extremities: Symmetric 5 x 5 power. Skin: No rashes, lesions or ulcers Psychiatry: Judgement and insight appear normal.  Mood & affect appropriate.     Data Reviewed: I have personally reviewed following labs and imaging studies  CBC: Recent Labs  Lab 02/09/22 0636 02/12/22 0613 02/13/22 1151  WBC 8.8 6.2 7.8  HGB 12.1* 11.7* 12.9*  HCT 37.0* 36.2* 39.4  MCV 95.1 96.5 95.4  PLT 171 193 174   Basic Metabolic Panel: Recent Labs  Lab 02/10/22 0547 02/11/22 0603 02/12/22 0613 02/13/22 0623 02/13/22 1151 02/14/22 0613 02/15/22 0817  NA 135 135 137 137 136 137 136  K 3.7 3.9 3.6 3.7 4.0 4.2 3.9  CL 97* 99 97* 97* 94* 96* 95*  CO2 '30 30 31 '$ 32 32 30 30  GLUCOSE 113* 140* 125* 124* 183* 165* 150*  BUN 19 24* 24* 25* 25* 29* 28*  CREATININE 0.86 1.17 1.04 1.16 1.16 1.05 1.09  CALCIUM 8.9 8.9 8.9 9.0 9.0 9.4 9.2  MG 2.0 2.2 2.4 2.4  --  2.4  --    GFR: Estimated Creatinine Clearance: 94.1 mL/min (by C-G formula based on SCr of 1.09 mg/dL). Liver Function Tests: Recent Labs  Lab 02/11/22 0603  AST 22  ALT 27  ALKPHOS 78  BILITOT 1.0  PROT 6.8  ALBUMIN 3.1*   No results for input(s): "LIPASE", "AMYLASE" in the last 168 hours. No results for input(s): "AMMONIA" in the last 168 hours. Coagulation Profile: No results for input(s): "INR", "PROTIME" in the last 168 hours. Cardiac Enzymes: No results for input(s): "CKTOTAL", "CKMB", "CKMBINDEX", "TROPONINI" in the last 168 hours. BNP (last 3 results) No results for input(s): "PROBNP" in the last 8760 hours. HbA1C: No results for input(s): "HGBA1C" in the last 72 hours. CBG: Recent Labs  Lab 02/14/22 0821 02/14/22 1107 02/14/22 1638 02/14/22 2058 02/15/22 0733  GLUCAP 146* 118* 222* 188* 170*   Lipid Profile: No results for input(s): "CHOL", "HDL", "LDLCALC", "TRIG", "CHOLHDL", "LDLDIRECT" in the last 72 hours. Thyroid Function Tests: No results for input(s): "TSH", "T4TOTAL", "FREET4", "T3FREE", "THYROIDAB" in the last 72 hours. Anemia Panel: No results for input(s): "VITAMINB12", "FOLATE", "FERRITIN", "TIBC", "IRON",  "RETICCTPCT" in the last 72 hours. Sepsis Labs: No results for input(s): "PROCALCITON", "LATICACIDVEN" in the last 168 hours.  No results found for this or any previous visit (from the past 240 hour(s)).       Radiology Studies: PERIPHERAL VASCULAR CATHETERIZATION  Result Date: 02/14/2022 See surgical note for result.       Scheduled Meds:  aspirin EC  81 mg Oral Daily   empagliflozin  10 mg Oral Daily   enoxaparin (LOVENOX) injection  0.5 mg/kg Subcutaneous Q24H   furosemide  80 mg Intravenous BID   gabapentin  300 mg Oral TID   insulin aspart  0-15 Units Subcutaneous TID WC   insulin aspart  0-5 Units Subcutaneous QHS   midodrine  2.5 mg Oral BID WC   pantoprazole  40 mg Oral Daily   QUEtiapine  25 mg Oral QHS   Continuous Infusions:   LOS: 10 days      Sidney Ace, MD [facility]   If 7PM-7AM, please contact night-coverage  02/15/2022, 11:37 AM

## 2022-02-16 DIAGNOSIS — E1165 Type 2 diabetes mellitus with hyperglycemia: Secondary | ICD-10-CM | POA: Diagnosis not present

## 2022-02-16 DIAGNOSIS — I2510 Atherosclerotic heart disease of native coronary artery without angina pectoris: Secondary | ICD-10-CM | POA: Diagnosis not present

## 2022-02-16 DIAGNOSIS — I509 Heart failure, unspecified: Secondary | ICD-10-CM | POA: Diagnosis not present

## 2022-02-16 DIAGNOSIS — I5021 Acute systolic (congestive) heart failure: Secondary | ICD-10-CM | POA: Diagnosis not present

## 2022-02-16 DIAGNOSIS — I5043 Acute on chronic combined systolic (congestive) and diastolic (congestive) heart failure: Secondary | ICD-10-CM | POA: Diagnosis not present

## 2022-02-16 LAB — CBC
HCT: 41.1 % (ref 39.0–52.0)
Hemoglobin: 13.4 g/dL (ref 13.0–17.0)
MCH: 31.1 pg (ref 26.0–34.0)
MCHC: 32.6 g/dL (ref 30.0–36.0)
MCV: 95.4 fL (ref 80.0–100.0)
Platelets: 303 10*3/uL (ref 150–400)
RBC: 4.31 MIL/uL (ref 4.22–5.81)
RDW: 15.6 % — ABNORMAL HIGH (ref 11.5–15.5)
WBC: 8.1 10*3/uL (ref 4.0–10.5)
nRBC: 0 % (ref 0.0–0.2)

## 2022-02-16 LAB — GLUCOSE, CAPILLARY
Glucose-Capillary: 211 mg/dL — ABNORMAL HIGH (ref 70–99)
Glucose-Capillary: 173 mg/dL — ABNORMAL HIGH (ref 70–99)
Glucose-Capillary: 207 mg/dL — ABNORMAL HIGH (ref 70–99)
Glucose-Capillary: 262 mg/dL — ABNORMAL HIGH (ref 70–99)

## 2022-02-16 LAB — PROCALCITONIN: Procalcitonin: <0.1 ng/mL

## 2022-02-16 MED ORDER — SPIRONOLACTONE 25 MG PO TABS
12.5000 mg | ORAL_TABLET | Freq: Every day | ORAL | Status: DC
Start: 2022-02-16 — End: 2022-03-01
  Administered 2022-02-16 – 2022-03-01 (×14): 12.5 mg via ORAL
  Filled 2022-02-16: qty 1
  Filled 2022-02-16: qty 0.5
  Filled 2022-02-16 (×2): qty 1
  Filled 2022-02-16: qty 0.5
  Filled 2022-02-16: qty 1
  Filled 2022-02-16 (×2): qty 0.5
  Filled 2022-02-16 (×2): qty 1
  Filled 2022-02-16: qty 0.5
  Filled 2022-02-16: qty 1
  Filled 2022-02-16 (×3): qty 0.5
  Filled 2022-02-16: qty 1
  Filled 2022-02-16 (×4): qty 0.5
  Filled 2022-02-16 (×4): qty 1
  Filled 2022-02-16: qty 0.5
  Filled 2022-02-16: qty 1
  Filled 2022-02-16: qty 0.5

## 2022-02-16 MED ORDER — INSULIN ASPART 100 UNIT/ML IJ SOLN
3.0000 [IU] | Freq: Three times a day (TID) | INTRAMUSCULAR | Status: DC
Start: 2022-02-16 — End: 2022-02-28
  Administered 2022-02-16 – 2022-02-28 (×32): 3 [IU] via SUBCUTANEOUS
  Filled 2022-02-16 (×31): qty 1

## 2022-02-16 NOTE — Progress Notes (Signed)
Progress Note  Patient Name: Lawrence Murray Date of Encounter: 02/16/2022  [facility] Cardiologist: [place] is getting a little better.  Legs still swell and hurt, particularly when he elevates them for more than 15 minutes at a time.  No chest pain or palpitations.  No lightheadedness.  He is not worried by his soft blood pressure.  He complains of some muscle stiffness and soreness.  Inpatient Medications    Scheduled Meds:  aspirin EC  81 mg Oral Daily   empagliflozin  10 mg Oral Daily   enoxaparin (LOVENOX) injection  0.5 mg/kg Subcutaneous Q24H   furosemide  80 mg Intravenous BID   gabapentin  400 mg Oral TID   insulin aspart  0-15 Units Subcutaneous TID WC   insulin aspart  0-5 Units Subcutaneous QHS   midodrine  2.5 mg Oral BID WC   pantoprazole  40 mg Oral Daily   QUEtiapine  25 mg Oral QHS   spironolactone  12.5 mg Oral Daily   Continuous Infusions:  PRN Meds: acetaminophen, ALPRAZolam, ondansetron (ZOFRAN) IV, ondansetron (ZOFRAN) IV   Vital Signs    Vitals:   02/15/22 2349 02/16/22 0412 02/16/22 0415 02/16/22 0829  BP: 100/66 (!) 99/56  (!) 88/67  Pulse: 95 89  88  Resp: '20 20  20  '$ Temp: 63.0 F (16.0 C) 99 F (37.2 C)  98.3 F (36.8 C)  TempSrc: Oral     SpO2: 97% 98%  96%  Weight:   105.5 kg   Height:        Intake/Output Summary (Last 24 hours) at 02/16/2022 1026 Last data filed at 02/16/2022 0900 Gross per 24 hour  Intake 1040 ml  Output 2050 ml  Net -1010 ml      02/16/2022    4:15 AM 02/15/2022    3:33 AM 02/14/2022    5:26 AM  Last 3 Weights  Weight (lbs) 232 lb 9.4 oz 232 lb 1.6 oz 235 lb 6.4 oz  Weight (kg) 105.5 kg 105.28 kg 106.777 kg      Telemetry    Normal sinus rhythm. - Personally Reviewed  ECG    No new tracing.  Physical Exam   GEN: No acute distress.   Neck: Unable to assess JVP due to body habitus. Cardiac: RRR, no murmurs, rubs, or gallops.  Respiratory: Decreased breath sounds at  both lung bases.  No wheezes or crackles. GI: Soft, nontender, non-distended  MS: Both distal calves/ankles are wrapped.  There is 2+ pitting edema in the proximal calves bilaterally; No deformity. Neuro:  Nonfocal  Psych: Normal affect   Labs    High Sensitivity Troponin:   Recent Labs  Lab 02/05/22 0218 02/05/22 0403  TROPONINIHS 370* 367*     Chemistry Recent Labs  Lab 02/11/22 0603 02/12/22 0613 02/13/22 0623 02/13/22 1151 02/14/22 0613 02/15/22 0817  NA 135 137 137 136 137 136  K 3.9 3.6 3.7 4.0 4.2 3.9  CL 99 97* 97* 94* 96* 95*  CO2 30 31 32 32 30 30  GLUCOSE 140* 125* 124* 183* 165* 150*  BUN 24* 24* 25* 25* 29* 28*  CREATININE 1.17 1.04 1.16 1.16 1.05 1.09  CALCIUM 8.9 8.9 9.0 9.0 9.4 9.2  MG 2.2 2.4 2.4  --  2.4  --   PROT 6.8  --   --   --   --   --   ALBUMIN 3.1*  --   --   --   --   --  AST 22  --   --   --   --   --   ALT 27  --   --   --   --   --   ALKPHOS 78  --   --   --   --   --   BILITOT 1.0  --   --   --   --   --   GFRNONAA >60 >60 >60 >60 >60 >60  ANIONGAP '6 9 8 10 11 11    '$ Lipids No results for input(s): "CHOL", "TRIG", "HDL", "LABVLDL", "LDLCALC", "CHOLHDL" in the last 168 hours.  Hematology Recent Labs  Lab 02/12/22 0613 02/13/22 1151 02/16/22 0619  WBC 6.2 7.8 8.1  RBC 3.75* 4.13* 4.31  HGB 11.7* 12.9* 13.4  HCT 36.2* 39.4 41.1  MCV 96.5 95.4 95.4  MCH 31.2 31.2 31.1  MCHC 32.3 32.7 32.6  RDW 16.2* 15.9* 15.6*  PLT 193 240 303   Thyroid No results for input(s): "TSH", "FREET4" in the last 168 hours.  BNPNo results for input(s): "BNP", "PROBNP" in the last 168 hours.  DDimer No results for input(s): "DDIMER" in the last 168 hours.   Radiology    PERIPHERAL VASCULAR CATHETERIZATION  Result Date: 02/14/2022 See surgical note for result.   Cardiac Studies   Echocardiogram completed on 02/06/2022 1. Left ventricular ejection fraction, by estimation, is 30 to 35%. The  left ventricle has moderately decreased function. The  left ventricle  demonstrates global hypokinesis. Left ventricular diastolic function could  not be evaluated.   2. RV not well visualized but likely enlarged.   3. Large pleural effusion.   4. The mitral valve is normal in structure. Mild mitral valve  regurgitation.   5. The aortic valve is normal in structure. Aortic valve regurgitation is  not visualized.   Patient Profile     56 y.o. male male with history of obesity and tobacco abuse, diagnosed with acute HFrEF and LVEF of 30-35%.  Assessment & Plan    Acute HFrEF: Mr. Grygiel is still significantly volume overloaded but continues to improve.  We have had difficulty initiating goal-directed medical therapy due to persistent hypotension despite being on midodrine.  He is asymptomatic with his low blood pressures.  He was net -1.3 L yesterday, though his weight is unchanged. -Continue furosemide 80 mg IV twice daily. -Continue empagliflozin 10 mg daily. -Start spironolactone 12.5 mg daily. -Could consider adding digoxin. -Repeat BMP tomorrow. -Plan for right and left heart catheterization when Mr. Lukas is euvolemic, likely early/mid next week.  Hypotension: Asymptomatic. -Continue low-dose midodrine.  For questions or updates, please contact [place] Please consult [email or web address] for contact info under [facility].     Signed, Nelva Bush, MD  02/16/2022, 10:26 AM

## 2022-02-16 NOTE — Inpatient Diabetes Management (Signed)
Latest Reference Range & Units 02/15/22 07:33 02/15/22 11:44 02/15/22 16:10 02/15/22 20:57 02/16/22 08:28  Glucose-Capillary 70 - 99 mg/dL 170 (H) 219 (H) 168 (H) 321 (H) 211 (H)  (H): Data is abnormally high  Noted that blood sugars have been greater than 200 mg/dl.  Recommend adding Novolog 3 units TID with meals to insulin regimen if blood sugars continue to be elevated and if patient eats at least 50% of meal.  Harvel Ricks RN BSN CDE Diabetes Coordinator Pager: [phone]  8am-5pm

## 2022-02-16 NOTE — Progress Notes (Signed)
PROGRESS NOTE    Lawrence Murray  [id number]:[id number] DOB: October 19, 1965 DOA: 02/05/2022 PCP: Pcp, No    Brief Narrative:   56 y.o. male who has not seen a doctor in many years, presented to the hospital because of shortness of breath and significant swelling in the lower extremities.   He was admitted to the hospital for acute on chronic systolic and diastolic CHF with anasarca.  2D echo showed estimated EF of 30 to 96% but LV diastolic function could not be evaluated.  He was treated with IV Lasix.  BP was soft so midodrine was started to allow for adequate diuresis.  Troponin was elevated but this was attributed to demand ischemia.  Cardiologist recommended left and right heart cath once he is euvolemic.  There was concern for peripheral arterial disease.  Patient underwent angiogram with vascular surgery on 6/28.  Good arterial flow noted.  No intervention was indicated.  Volume status is markedly improved.  As of 6/30 patient is 23 L net negative.  Symptomatically improved.   Assessment & Plan:   Principal Problem:   Acute on chronic combined systolic and diastolic CHF (congestive heart failure) (HCC) Active Problems:   Smoker   Type 2 diabetes mellitus with hyperglycemia (HCC)   PVD (peripheral vascular disease) (HCC)   Acute HFrEF (heart failure with reduced ejection fraction) (HCC)  Acute on chronic systolic and diastolic congestive heart failure Anasarca 2D echocardiogram EF 30 to 78% Diastolic dysfunction indeterminate, suspect some element of diastolic dysfunction 23 L net negative since admission Plan: Continue Lasix 80 mg IV twice daily Daily renal function, creatinine remained stable Continue midodrine 2.5 3 times daily Left and right heart cath early next week when more euvolemic  Hypotension BP remains soft Continue midodrine 2.5 3 times daily Hold GDMT  Elevated troponin Likely secondary demand ischemia No indication of ACS  Bilateral pleural effusions Likely  secondary to decompensated heart failure Patient on room air, clear lungs  Type 2 diabetes mellitus Hemoglobin A1c 7.7 SSI with NovoLog Continue empagliflozin We will consider metformin at discharge  Peripheral vascular disease Lower extremity angiogram reassuring  Anxiety Seroquel as needed as needed Xanax  Tobacco use disorder Counseled patient  DVT prophylaxis: Lovenox Code Status: Full Family Communication: None today Disposition Plan: Status is: Inpatient Remains inpatient appropriate because: Acute decompensated heart failure on IV diuretics.  Remains anasarcic   Level of care: Telemetry Cardiac  Consultants:  Cardiology Vascular surgery  Procedures:  Lower extremity angiography 6/28  Antimicrobials: None   Subjective: Seen and examined.  Sitting up in chair.  No visible distress.  No pain complaints  Objective: Vitals:   02/16/22 0412 02/16/22 0415 02/16/22 0829 02/16/22 1234  BP: (!) 99/56  (!) 88/67 (!) 83/67  Pulse: 89  88 93  Resp: '20  20 16  '$ Temp: 99 F (37.2 C)  98.3 F (36.8 C) 98.5 F (36.9 C)  TempSrc:      SpO2: 98%  96% 99%  Weight:  105.5 kg    Height:        Intake/Output Summary (Last 24 hours) at 02/16/2022 1335 Last data filed at 02/16/2022 0900 Gross per 24 hour  Intake 1040 ml  Output 2050 ml  Net -1010 ml   Filed Weights   02/14/22 0526 02/15/22 0333 02/16/22 0415  Weight: 106.8 kg 105.3 kg 105.5 kg    Examination:  General exam: No acute distress Respiratory system: Bibasilar crackles.  Normal work of breathing.  Room air Cardiovascular system: S1-S2,  RRR, no murmurs, 2+ pedal edema BLE Gastrointestinal system: Soft, NT/ND, normal bowel sounds Central nervous system: Alert and oriented. No focal neurological deficits. Extremities: Symmetric 5 x 5 power. Skin: No rashes, lesions or ulcers Psychiatry: Judgement and insight appear normal. Mood & affect appropriate.     Data Reviewed: I have personally reviewed  following labs and imaging studies  CBC: Recent Labs  Lab 02/12/22 0613 02/13/22 1151 02/16/22 0619  WBC 6.2 7.8 8.1  HGB 11.7* 12.9* 13.4  HCT 36.2* 39.4 41.1  MCV 96.5 95.4 95.4  PLT 193 240 786   Basic Metabolic Panel: Recent Labs  Lab 02/10/22 0547 02/11/22 0603 02/12/22 0613 02/13/22 0623 02/13/22 1151 02/14/22 0613 02/15/22 0817  NA 135 135 137 137 136 137 136  K 3.7 3.9 3.6 3.7 4.0 4.2 3.9  CL 97* 99 97* 97* 94* 96* 95*  CO2 '30 30 31 '$ 32 32 30 30  GLUCOSE 113* 140* 125* 124* 183* 165* 150*  BUN 19 24* 24* 25* 25* 29* 28*  CREATININE 0.86 1.17 1.04 1.16 1.16 1.05 1.09  CALCIUM 8.9 8.9 8.9 9.0 9.0 9.4 9.2  MG 2.0 2.2 2.4 2.4  --  2.4  --    GFR: Estimated Creatinine Clearance: 94.2 mL/min (by C-G formula based on SCr of 1.09 mg/dL). Liver Function Tests: Recent Labs  Lab 02/11/22 0603  AST 22  ALT 27  ALKPHOS 78  BILITOT 1.0  PROT 6.8  ALBUMIN 3.1*   No results for input(s): "LIPASE", "AMYLASE" in the last 168 hours. No results for input(s): "AMMONIA" in the last 168 hours. Coagulation Profile: No results for input(s): "INR", "PROTIME" in the last 168 hours. Cardiac Enzymes: No results for input(s): "CKTOTAL", "CKMB", "CKMBINDEX", "TROPONINI" in the last 168 hours. BNP (last 3 results) No results for input(s): "PROBNP" in the last 8760 hours. HbA1C: No results for input(s): "HGBA1C" in the last 72 hours. CBG: Recent Labs  Lab 02/15/22 1144 02/15/22 1610 02/15/22 2057 02/16/22 0828 02/16/22 1237  GLUCAP 219* 168* 321* 211* 173*   Lipid Profile: No results for input(s): "CHOL", "HDL", "LDLCALC", "TRIG", "CHOLHDL", "LDLDIRECT" in the last 72 hours. Thyroid Function Tests: No results for input(s): "TSH", "T4TOTAL", "FREET4", "T3FREE", "THYROIDAB" in the last 72 hours. Anemia Panel: No results for input(s): "VITAMINB12", "FOLATE", "FERRITIN", "TIBC", "IRON", "RETICCTPCT" in the last 72 hours. Sepsis Labs: Recent Labs  Lab 02/16/22 7672   PROCALCITON <0.10    Recent Results (from the past 240 hour(s))  Aerobic Culture w Gram Stain (superficial specimen)     Status: None (Preliminary result)   Collection Time: 02/15/22  2:20 PM   Specimen: Wound  Result Value Ref Range Status   Specimen Description   Final    WOUND Performed at [facility], [address] [place], [place] [address]    Special Requests   Final    HS STER Performed at [facility], [address] [place], [place] [address]    Gram Stain   Final    NO SQUAMOUS EPITHELIAL CELLS SEEN NO WBC SEEN ABUNDANT GRAM NEGATIVE RODS ABUNDANT GRAM POSITIVE COCCI    Culture   Final    TOO YOUNG TO READ Performed at [facility], [address] [place], [place] [address]    Report Status PENDING  Incomplete         Radiology Studies: No results found.      Scheduled Meds:  aspirin EC  81 mg Oral Daily   empagliflozin  10 mg Oral Daily   enoxaparin (LOVENOX) injection  0.5 mg/kg Subcutaneous Q24H   furosemide  80 mg Intravenous BID   gabapentin  400 mg Oral TID   insulin aspart  0-15 Units Subcutaneous TID WC   insulin aspart  0-5 Units Subcutaneous QHS   insulin aspart  3 Units Subcutaneous TID WC   midodrine  2.5 mg Oral BID WC   pantoprazole  40 mg Oral Daily   QUEtiapine  25 mg Oral QHS   spironolactone  12.5 mg Oral Daily   Continuous Infusions:   LOS: 11 days      Sidney Ace, MD [facility]   If 7PM-7AM, please contact night-coverage  02/16/2022, 1:35 PM

## 2022-02-17 DIAGNOSIS — I5043 Acute on chronic combined systolic (congestive) and diastolic (congestive) heart failure: Secondary | ICD-10-CM | POA: Diagnosis not present

## 2022-02-17 DIAGNOSIS — I429 Cardiomyopathy, unspecified: Secondary | ICD-10-CM | POA: Diagnosis not present

## 2022-02-17 DIAGNOSIS — I509 Heart failure, unspecified: Secondary | ICD-10-CM | POA: Diagnosis not present

## 2022-02-17 DIAGNOSIS — Z7189 Other specified counseling: Secondary | ICD-10-CM | POA: Diagnosis not present

## 2022-02-17 DIAGNOSIS — E1165 Type 2 diabetes mellitus with hyperglycemia: Secondary | ICD-10-CM | POA: Diagnosis not present

## 2022-02-17 DIAGNOSIS — R52 Pain, unspecified: Secondary | ICD-10-CM | POA: Diagnosis not present

## 2022-02-17 DIAGNOSIS — I5021 Acute systolic (congestive) heart failure: Secondary | ICD-10-CM | POA: Diagnosis not present

## 2022-02-17 LAB — BASIC METABOLIC PANEL
Anion gap: 11 (ref 5–15)
BUN: 29 mg/dL — ABNORMAL HIGH (ref 6–20)
CO2: 32 mmol/L (ref 22–32)
Calcium: 9 mg/dL (ref 8.9–10.3)
Chloride: 93 mmol/L — ABNORMAL LOW (ref 98–111)
Creatinine, Ser: 1.07 mg/dL (ref 0.61–1.24)
GFR, Estimated: >60 mL/min (ref >60)
Glucose, Bld: 150 mg/dL — ABNORMAL HIGH (ref 70–99)
Potassium: 4 mmol/L (ref 3.5–5.1)
Sodium: 136 mmol/L (ref 135–145)

## 2022-02-17 LAB — GLUCOSE, CAPILLARY
Glucose-Capillary: 197 mg/dL — ABNORMAL HIGH (ref 70–99)
Glucose-Capillary: 197 mg/dL — ABNORMAL HIGH (ref 70–99)
Glucose-Capillary: 148 mg/dL — ABNORMAL HIGH (ref 70–99)
Glucose-Capillary: 224 mg/dL — ABNORMAL HIGH (ref 70–99)

## 2022-02-17 MED ORDER — METHOCARBAMOL 500 MG PO TABS
750.0000 mg | ORAL_TABLET | Freq: Four times a day (QID) | ORAL | Status: DC
Start: 2022-02-17 — End: 2022-02-17

## 2022-02-17 MED ORDER — METHOCARBAMOL 500 MG PO TABS
500.0000 mg | ORAL_TABLET | Freq: Three times a day (TID) | ORAL | Status: DC
  Administered 2022-02-17: 500 mg via ORAL
  Filled 2022-02-17: qty 1

## 2022-02-17 MED ORDER — GABAPENTIN 600 MG PO TABS
600.0000 mg | ORAL_TABLET | Freq: Three times a day (TID) | ORAL | Status: DC
  Administered 2022-02-17 – 2022-02-19 (×6): 600 mg via ORAL
  Filled 2022-02-17 (×6): qty 1

## 2022-02-17 MED ORDER — METHOCARBAMOL 500 MG PO TABS
750.0000 mg | ORAL_TABLET | Freq: Four times a day (QID) | ORAL | Status: DC
  Administered 2022-02-17 – 2022-03-01 (×47): 750 mg via ORAL
  Filled 2022-02-17 (×46): qty 2

## 2022-02-17 MED ORDER — GABAPENTIN 600 MG PO TABS: 600.0000 mg | ORAL_TABLET | Freq: Three times a day (TID) | ORAL | Status: DC

## 2022-02-17 NOTE — Progress Notes (Signed)
Progress Note  Patient Name: Lawrence Murray Date of Encounter: 02/17/2022  [facility] Cardiologist: Agbor-Etang  Subjective   Dyspnea continues to improve.  Legs still with swelling, and with intermittent sharp pains as well as pain when he elevates them for more than 15 minutes at a time.  No chest pain or palpitations.  No lightheadedness.  He did have some "whole upper body pain" yesterday that lasted ~ 1 hour and self resolved without intervention. He complains of some muscle stiffness and soreness.  Inpatient Medications    Scheduled Meds:  aspirin EC  81 mg Oral Daily   empagliflozin  10 mg Oral Daily   enoxaparin (LOVENOX) injection  0.5 mg/kg Subcutaneous Q24H   furosemide  80 mg Intravenous BID   gabapentin  400 mg Oral TID   insulin aspart  0-15 Units Subcutaneous TID WC   insulin aspart  0-5 Units Subcutaneous QHS   insulin aspart  3 Units Subcutaneous TID WC   midodrine  2.5 mg Oral BID WC   pantoprazole  40 mg Oral Daily   QUEtiapine  25 mg Oral QHS   spironolactone  12.5 mg Oral Daily   Continuous Infusions:  PRN Meds: acetaminophen, ALPRAZolam, ondansetron (ZOFRAN) IV, ondansetron (ZOFRAN) IV   Vital Signs    Vitals:   02/16/22 1930 02/16/22 2334 02/17/22 0546 02/17/22 0802  BP: 107/71 (!) 132/115 103/74 93/63  Pulse: (!) 101 87 88 90  Resp: '18 18 18 18  '$ Temp: 98.8 F (37.1 C) 98 F (36.7 C) 98.1 F (36.7 C) 98.1 F (36.7 C)  TempSrc:      SpO2: 98% 95% 100% 99%  Weight:      Height:        Intake/Output Summary (Last 24 hours) at 02/17/2022 0811 Last data filed at 02/17/2022 0300 Gross per 24 hour  Intake 720 ml  Output 3205 ml  Net -2485 ml       02/16/2022    4:15 AM 02/15/2022    3:33 AM 02/14/2022    5:26 AM  Last 3 Weights  Weight (lbs) 232 lb 9.4 oz 232 lb 1.6 oz 235 lb 6.4 oz  Weight (kg) 105.5 kg 105.28 kg 106.777 kg      Telemetry    Normal sinus rhythm. - Personally Reviewed  ECG    No new tracing.  Physical Exam    GEN: No acute distress.   Neck: Unable to assess JVP due to body habitus. Cardiac: RRR, no murmurs, rubs, or gallops.  Respiratory: Decreased breath sounds at both lung bases.  No wheezes or crackles. GI: Soft, nontender, non-distended  MS: Both distal calves/ankles are wrapped.  There is 2+ pitting edema in the proximal calves bilaterally; No deformity. Neuro:  Nonfocal  Psych: Normal affect   Labs    High Sensitivity Troponin:   Recent Labs  Lab 02/05/22 0218 02/05/22 0403  TROPONINIHS 370* 367*      Chemistry Recent Labs  Lab 02/11/22 0603 02/12/22 6237 02/13/22 0623 02/13/22 1151 02/14/22 0613 02/15/22 0817 02/17/22 0632  NA 135 137 137   < > 137 136 136  K 3.9 3.6 3.7   < > 4.2 3.9 4.0  CL 99 97* 97*   < > 96* 95* 93*  CO2 30 31 32   < > 30 30 32  GLUCOSE 140* 125* 124*   < > 165* 150* 150*  BUN 24* 24* 25*   < > 29* 28* 29*  CREATININE 1.17 1.04 1.16   < >  1.05 1.09 1.07  CALCIUM 8.9 8.9 9.0   < > 9.4 9.2 9.0  MG 2.2 2.4 2.4  --  2.4  --   --   PROT 6.8  --   --   --   --   --   --   ALBUMIN 3.1*  --   --   --   --   --   --   AST 22  --   --   --   --   --   --   ALT 27  --   --   --   --   --   --   ALKPHOS 78  --   --   --   --   --   --   BILITOT 1.0  --   --   --   --   --   --   GFRNONAA >60 >60 >60   < > >60 >60 >60  ANIONGAP '6 9 8   '$ < > '11 11 11   '$ < > = values in this interval not displayed.     Lipids No results for input(s): "CHOL", "TRIG", "HDL", "LABVLDL", "LDLCALC", "CHOLHDL" in the last 168 hours.  Hematology Recent Labs  Lab 02/12/22 0613 02/13/22 1151 02/16/22 0619  WBC 6.2 7.8 8.1  RBC 3.75* 4.13* 4.31  HGB 11.7* 12.9* 13.4  HCT 36.2* 39.4 41.1  MCV 96.5 95.4 95.4  MCH 31.2 31.2 31.1  MCHC 32.3 32.7 32.6  RDW 16.2* 15.9* 15.6*  PLT 193 240 303    Thyroid No results for input(s): "TSH", "FREET4" in the last 168 hours.  BNPNo results for input(s): "BNP", "PROBNP" in the last 168 hours.  DDimer No results for input(s):  "DDIMER" in the last 168 hours.   Radiology    No results found.  Cardiac Studies   Echocardiogram completed on 02/06/2022 1. Left ventricular ejection fraction, by estimation, is 30 to 35%. The  left ventricle has moderately decreased function. The left ventricle  demonstrates global hypokinesis. Left ventricular diastolic function could  not be evaluated.   2. RV not well visualized but likely enlarged.   3. Large pleural effusion.   4. The mitral valve is normal in structure. Mild mitral valve  regurgitation.   5. The aortic valve is normal in structure. Aortic valve regurgitation is  not visualized.   Patient Profile     56 y.o. male male with history of obesity and tobacco abuse, diagnosed with acute HFrEF and LVEF of 30-35%.  Assessment & Plan    Acute HFrEF: Mr. Allaire remains significantly volume overloaded but continues to improve.  We have had difficulty initiating goal-directed medical therapy due to persistent hypotension despite being on midodrine.  He is asymptomatic with his low blood pressures.  He was net -2.7 L yesterday. His weight has improved from 145 kg to 105 kg throughout the admission -Continue furosemide 80 mg IV twice daily. -Continue empagliflozin 10 mg daily. -Continue spironolactone 12.5 mg daily. -Could consider adding digoxin, will discuss with MD. -Renal function stable.  -Plan for right and left heart catheterization when Mr. Fullington is euvolemic, likely early/mid next week.  Hypotension: Asymptomatic. -Continue low-dose midodrine.  For questions or updates, please contact [place] Please consult [email or web address] for contact info under [facility].     Signed, Christell Faith, PA-C  02/17/2022, 8:11 AM

## 2022-02-17 NOTE — Progress Notes (Signed)
PROGRESS NOTE    Lawrence Murray  HFW:[id number] DOB: Nov 21, 1965 DOA: 02/05/2022 PCP: Pcp, No    Brief Narrative:   56 y.o. male who has not seen a doctor in many years, presented to the hospital because of shortness of breath and significant swelling in the lower extremities.   He was admitted to the hospital for acute on chronic systolic and diastolic CHF with anasarca.  2D echo showed estimated EF of 30 to 02% but LV diastolic function could not be evaluated.  He was treated with IV Lasix.  BP was soft so midodrine was started to allow for adequate diuresis.  Troponin was elevated but this was attributed to demand ischemia.  Cardiologist recommended left and right heart cath once he is euvolemic.  There was concern for peripheral arterial disease.  Patient underwent angiogram with vascular surgery on 6/28.  Good arterial flow noted.  No intervention was indicated.  Volume status is markedly improved.  As of 6/30 patient is 25.5 L net negative.  Symptomatically improved.   Assessment & Plan:   Principal Problem:   Acute on chronic combined systolic and diastolic CHF (congestive heart failure) (HCC) Active Problems:   Smoker   Type 2 diabetes mellitus with hyperglycemia (HCC)   PVD (peripheral vascular disease) (HCC)   Acute HFrEF (heart failure with reduced ejection fraction) (HCC)  Acute on chronic systolic and diastolic congestive heart failure Anasarca 2D echocardiogram EF 30 to 77% Diastolic dysfunction indeterminate, suspect some element of diastolic dysfunction 23 L net negative since admission Plan: Continue Lasix 80 mg IV twice daily Daily renal function, creatinine remained stable Continue midodrine 2.5 3 times daily Left and right heart cath early next week when more euvolemic Unable to add GDMT at this time  Hypotension BP remains soft Continue midodrine 2.5 3 times daily Hold GDMT  Elevated troponin Likely secondary demand ischemia No indication of  ACS  Bilateral pleural effusions Likely secondary to decompensated heart failure Patient on room air, clear lungs  Type 2 diabetes mellitus Hemoglobin A1c 7.7 SSI with NovoLog Continue empagliflozin We will consider metformin at discharge  Peripheral vascular disease Lower extremity angiogram reassuring  Anxiety Seroquel as needed as needed Xanax  Tobacco use disorder Counseled patient  DVT prophylaxis: Lovenox Code Status: Full Family Communication: None today Disposition Plan: Status is: Inpatient Remains inpatient appropriate because: Acute decompensated heart failure on IV diuretics.  Remains anasarcic   Level of care: Telemetry Cardiac  Consultants:  Cardiology Vascular surgery  Procedures:  Lower extremity angiography 6/28  Antimicrobials: None   Subjective: Seen and examined.  Sitting up in chair.  No visible distress.  Complains of shooting pain in bilateral lower extremities  Objective: Vitals:   02/16/22 1930 02/16/22 2334 02/17/22 0546 02/17/22 0802  BP: 107/71 (!) 132/115 103/74 93/63  Pulse: (!) 101 87 88 90  Resp: '18 18 18 18  '$ Temp: 98.8 F (37.1 C) 98 F (36.7 C) 98.1 F (36.7 C) 98.1 F (36.7 C)  TempSrc:      SpO2: 98% 95% 100% 99%  Weight:      Height:        Intake/Output Summary (Last 24 hours) at 02/17/2022 1142 Last data filed at 02/17/2022 1023 Gross per 24 hour  Intake 720 ml  Output 3205 ml  Net -2485 ml   Filed Weights   02/14/22 0526 02/15/22 0333 02/16/22 0415  Weight: 106.8 kg 105.3 kg 105.5 kg    Examination:  General exam: No acute distress Respiratory system:  Lungs clear.  No work of breathing.  Room air Cardiovascular system: S1-S2, RRR, no murmurs, 2+ pedal edema BLE, legs and wraps Gastrointestinal system: Soft, NT/ND, normal bowel sounds Central nervous system: Alert and oriented. No focal neurological deficits. Extremities: Symmetric 5 x 5 power. Skin: No rashes, lesions or ulcers Psychiatry: Judgement  and insight appear normal. Mood & affect appropriate.     Data Reviewed: I have personally reviewed following labs and imaging studies  CBC: Recent Labs  Lab 02/12/22 0613 02/13/22 1151 02/16/22 0619  WBC 6.2 7.8 8.1  HGB 11.7* 12.9* 13.4  HCT 36.2* 39.4 41.1  MCV 96.5 95.4 95.4  PLT 193 240 703   Basic Metabolic Panel: Recent Labs  Lab 02/11/22 0603 02/12/22 0613 02/13/22 0623 02/13/22 1151 02/14/22 0613 02/15/22 0817 02/17/22 0632  NA 135 137 137 136 137 136 136  K 3.9 3.6 3.7 4.0 4.2 3.9 4.0  CL 99 97* 97* 94* 96* 95* 93*  CO2 30 31 32 32 30 30 32  GLUCOSE 140* 125* 124* 183* 165* 150* 150*  BUN 24* 24* 25* 25* 29* 28* 29*  CREATININE 1.17 1.04 1.16 1.16 1.05 1.09 1.07  CALCIUM 8.9 8.9 9.0 9.0 9.4 9.2 9.0  MG 2.2 2.4 2.4  --  2.4  --   --    GFR: Estimated Creatinine Clearance: 96 mL/min (by C-G formula based on SCr of 1.07 mg/dL). Liver Function Tests: Recent Labs  Lab 02/11/22 0603  AST 22  ALT 27  ALKPHOS 78  BILITOT 1.0  PROT 6.8  ALBUMIN 3.1*   No results for input(s): "LIPASE", "AMYLASE" in the last 168 hours. No results for input(s): "AMMONIA" in the last 168 hours. Coagulation Profile: No results for input(s): "INR", "PROTIME" in the last 168 hours. Cardiac Enzymes: No results for input(s): "CKTOTAL", "CKMB", "CKMBINDEX", "TROPONINI" in the last 168 hours. BNP (last 3 results) No results for input(s): "PROBNP" in the last 8760 hours. HbA1C: No results for input(s): "HGBA1C" in the last 72 hours. CBG: Recent Labs  Lab 02/16/22 0828 02/16/22 1237 02/16/22 1603 02/16/22 1959 02/17/22 0800  GLUCAP 211* 173* 207* 262* 197*   Lipid Profile: No results for input(s): "CHOL", "HDL", "LDLCALC", "TRIG", "CHOLHDL", "LDLDIRECT" in the last 72 hours. Thyroid Function Tests: No results for input(s): "TSH", "T4TOTAL", "FREET4", "T3FREE", "THYROIDAB" in the last 72 hours. Anemia Panel: No results for input(s): "VITAMINB12", "FOLATE", "FERRITIN",  "TIBC", "IRON", "RETICCTPCT" in the last 72 hours. Sepsis Labs: Recent Labs  Lab 02/16/22 5009  PROCALCITON <0.10    Recent Results (from the past 240 hour(s))  Aerobic Culture w Gram Stain (superficial specimen)     Status: None (Preliminary result)   Collection Time: 02/15/22  2:20 PM   Specimen: Wound  Result Value Ref Range Status   Specimen Description   Final    WOUND Performed at [facility], [address] [place], [place] [address]    Special Requests   Final    HS STER Performed at [facility], [address] [place], [place] [address]    Gram Stain   Final    NO SQUAMOUS EPITHELIAL CELLS SEEN NO WBC SEEN ABUNDANT GRAM NEGATIVE RODS ABUNDANT GRAM POSITIVE COCCI    Culture   Final    TOO YOUNG TO READ Performed at [facility], [address], [place], [place] [address]    Report Status PENDING  Incomplete         Radiology Studies: No results found.  Scheduled Meds:  aspirin EC  81 mg Oral Daily   empagliflozin  10 mg Oral Daily   enoxaparin (LOVENOX) injection  0.5 mg/kg Subcutaneous Q24H   furosemide  80 mg Intravenous BID   gabapentin  400 mg Oral TID   insulin aspart  0-15 Units Subcutaneous TID WC   insulin aspart  0-5 Units Subcutaneous QHS   insulin aspart  3 Units Subcutaneous TID WC   methocarbamol  500 mg Oral TID   midodrine  2.5 mg Oral BID WC   pantoprazole  40 mg Oral Daily   QUEtiapine  25 mg Oral QHS   spironolactone  12.5 mg Oral Daily   Continuous Infusions:   LOS: 12 days      Sidney Ace, MD [facility]   If 7PM-7AM, please contact night-coverage  02/17/2022, 11:42 AM

## 2022-02-18 DIAGNOSIS — Z7189 Other specified counseling: Secondary | ICD-10-CM | POA: Diagnosis not present

## 2022-02-18 DIAGNOSIS — R52 Pain, unspecified: Secondary | ICD-10-CM | POA: Diagnosis not present

## 2022-02-18 DIAGNOSIS — E1165 Type 2 diabetes mellitus with hyperglycemia: Secondary | ICD-10-CM | POA: Diagnosis not present

## 2022-02-18 DIAGNOSIS — I509 Heart failure, unspecified: Secondary | ICD-10-CM | POA: Diagnosis not present

## 2022-02-18 DIAGNOSIS — I5043 Acute on chronic combined systolic (congestive) and diastolic (congestive) heart failure: Secondary | ICD-10-CM | POA: Diagnosis not present

## 2022-02-18 DIAGNOSIS — I429 Cardiomyopathy, unspecified: Secondary | ICD-10-CM | POA: Diagnosis not present

## 2022-02-18 DIAGNOSIS — I5021 Acute systolic (congestive) heart failure: Secondary | ICD-10-CM | POA: Diagnosis not present

## 2022-02-18 LAB — BASIC METABOLIC PANEL
Anion gap: 10 (ref 5–15)
BUN: 31 mg/dL — ABNORMAL HIGH (ref 6–20)
CO2: 32 mmol/L (ref 22–32)
Calcium: 9.1 mg/dL (ref 8.9–10.3)
Chloride: 94 mmol/L — ABNORMAL LOW (ref 98–111)
Creatinine, Ser: 0.99 mg/dL (ref 0.61–1.24)
GFR, Estimated: >60 mL/min (ref >60)
Glucose, Bld: 150 mg/dL — ABNORMAL HIGH (ref 70–99)
Potassium: 3.8 mmol/L (ref 3.5–5.1)
Sodium: 136 mmol/L (ref 135–145)

## 2022-02-18 LAB — AEROBIC CULTURE W GRAM STAIN (SUPERFICIAL SPECIMEN): Gram Stain: NONE SEEN

## 2022-02-18 LAB — GLUCOSE, CAPILLARY
Glucose-Capillary: 153 mg/dL — ABNORMAL HIGH (ref 70–99)
Glucose-Capillary: 310 mg/dL — ABNORMAL HIGH (ref 70–99)
Glucose-Capillary: 136 mg/dL — ABNORMAL HIGH (ref 70–99)
Glucose-Capillary: 252 mg/dL — ABNORMAL HIGH (ref 70–99)

## 2022-02-18 MED ORDER — ALPRAZOLAM 0.5 MG PO TABS
0.5000 mg | ORAL_TABLET | Freq: Three times a day (TID) | ORAL | Status: DC | PRN
Start: 2022-02-18 — End: 2022-03-01
  Administered 2022-02-18 – 2022-03-01 (×32): 0.5 mg via ORAL
  Filled 2022-02-18 (×32): qty 1

## 2022-02-18 MED ORDER — POTASSIUM CHLORIDE CRYS ER 20 MEQ PO TBCR
40.0000 meq | EXTENDED_RELEASE_TABLET | Freq: Once | ORAL | Status: AC
Start: 2022-02-18 — End: 2022-02-18
  Administered 2022-02-18: 40 meq via ORAL
  Filled 2022-02-18: qty 2

## 2022-02-18 NOTE — Progress Notes (Signed)
Progress Note  Patient Name: Lawrence Murray Date of Encounter: 02/18/2022  [facility] Cardiologist: Kate Sable, MD   Subjective   Continues to have concerns regarding diffuse pain, discussed at length today. On exam most of tenderness was bilateral posterior legs, with shooting pain to feet. Reviewed meds that have been tried. He is concerned that this will limit his tolerance on the cath table, as he has been unable to lie in bed due to pain. He did tolerate peripheral angiography with medication, which we reviewed. Again discussed fluid restriction, salt restriction. He has been frustrated with the menu and fluid restriction during the admission but understands the importance.  Inpatient Medications    Scheduled Meds:  aspirin EC  81 mg Oral Daily   empagliflozin  10 mg Oral Daily   enoxaparin (LOVENOX) injection  0.5 mg/kg Subcutaneous Q24H   furosemide  80 mg Intravenous BID   gabapentin  600 mg Oral TID   insulin aspart  0-15 Units Subcutaneous TID WC   insulin aspart  0-5 Units Subcutaneous QHS   insulin aspart  3 Units Subcutaneous TID WC   methocarbamol  750 mg Oral QID   midodrine  2.5 mg Oral BID WC   pantoprazole  40 mg Oral Daily   QUEtiapine  25 mg Oral QHS   spironolactone  12.5 mg Oral Daily   Continuous Infusions:  PRN Meds: acetaminophen, ALPRAZolam, ondansetron (ZOFRAN) IV, ondansetron (ZOFRAN) IV   Vital Signs    Vitals:   02/18/22 0417 02/18/22 0500 02/18/22 0748 02/18/22 1150  BP: (!) 95/59  91/71 90/60  Pulse: 89  83 84  Resp: '20  18 18  '$ Temp: 97.9 F (36.6 C)     TempSrc:      SpO2: 94%  96% 98%  Weight:  105 kg    Height:        Intake/Output Summary (Last 24 hours) at 02/18/2022 1434 Last data filed at 02/18/2022 1230 Gross per 24 hour  Intake --  Output 2725 ml  Net -2725 ml      02/18/2022    5:00 AM 02/16/2022    4:15 AM 02/15/2022    3:33 AM  Last 3 Weights  Weight (lbs) 231 lb 7.7 oz 232 lb 9.4 oz 232 lb 1.6 oz  Weight  (kg) 105 kg 105.5 kg 105.28 kg      Telemetry    SR with intermittent PVCs - Personally Reviewed  ECG    No new - Personally Reviewed  Physical Exam   GEN: No acute distress.   Neck: No JVD sitting at 90 degrees Cardiac: RRR, no murmurs, rubs, or gallops.  Respiratory: Clear to auscultation bilaterally, improved at bases GI: Soft, nontender, non-distended  MS: bilateral 2+ pitting LE edema despite unna boots Neuro:  Nonfocal  Psych: Normal affect   Labs    High Sensitivity Troponin:   Recent Labs  Lab 02/05/22 0218 02/05/22 0403  TROPONINIHS 370* 367*     Chemistry Recent Labs  Lab 02/12/22 0613 02/13/22 0623 02/13/22 1151 02/14/22 0613 02/15/22 0817 02/17/22 0632 02/18/22 0809  NA 137 137   < > 137 136 136 136  K 3.6 3.7   < > 4.2 3.9 4.0 3.8  CL 97* 97*   < > 96* 95* 93* 94*  CO2 31 32   < > 30 30 32 32  GLUCOSE 125* 124*   < > 165* 150* 150* 150*  BUN 24* 25*   < > 29* 28*  29* 31*  CREATININE 1.04 1.16   < > 1.05 1.09 1.07 0.99  CALCIUM 8.9 9.0   < > 9.4 9.2 9.0 9.1  MG 2.4 2.4  --  2.4  --   --   --   GFRNONAA >60 >60   < > >60 >60 >60 >60  ANIONGAP 9 8   < > '11 11 11 10   '$ < > = values in this interval not displayed.    Lipids No results for input(s): "CHOL", "TRIG", "HDL", "LABVLDL", "LDLCALC", "CHOLHDL" in the last 168 hours.  Hematology Recent Labs  Lab 02/12/22 0613 02/13/22 1151 02/16/22 0619  WBC 6.2 7.8 8.1  RBC 3.75* 4.13* 4.31  HGB 11.7* 12.9* 13.4  HCT 36.2* 39.4 41.1  MCV 96.5 95.4 95.4  MCH 31.2 31.2 31.1  MCHC 32.3 32.7 32.6  RDW 16.2* 15.9* 15.6*  PLT 193 240 303   Thyroid No results for input(s): "TSH", "FREET4" in the last 168 hours.  BNPNo results for input(s): "BNP", "PROBNP" in the last 168 hours.  DDimer No results for input(s): "DDIMER" in the last 168 hours.   Radiology    No results found.  Cardiac Studies   Echocardiogram completed on 02/06/2022 1. Left ventricular ejection fraction, by estimation, is 30 to  35%. The  left ventricle has moderately decreased function. The left ventricle  demonstrates global hypokinesis. Left ventricular diastolic function could  not be evaluated.   2. RV not well visualized but likely enlarged.   3. Large pleural effusion.   4. The mitral valve is normal in structure. Mild mitral valve  regurgitation.   5. The aortic valve is normal in structure. Aortic valve regurgitation is  not visualized.   Patient Profile     56 y.o. male with history of obesity and tobacco abuse, diagnosed with acute HFrEF and LVEF of 30-35%.  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 Tolerating only spironolactone and empagliflozin currently. Add beta blocker and ARB when able, ideally would like to transition off midodrine long term -admission weight 145.2 kg, today's weight 105 kg, charted net negative 27 L -urine output 1.8 L yesterday plus uncharted, continue furosemide 80 mg IV BID. Renal function stable -mild hypokalemia today, will replete with 40 meq given continued diuresis -planned for [facility] once euvolemic, expect this week. Will need medication to tolerate, did well with cocktail he received during peripheral angiography -continue empagliflozin 10 mg daily, spironolactone 12.5 mg daily -will hold on digoxin at this time as long as urine output remains brisk and renal function stable   Type II diabetes -A1c 7.7 -started on empagliflozin -has SSI while admitted -on aspirin   Time Spent with Patient: I have spent a total of 60 minutes in patient care, including reviewing hospital notes, telemetry, EKGs, labs; discussing with the team; examining the patient; and establishing an assessment and plan that was discussed with the patient.  > 50% of time was spent in direct patient care.  Extensive time spent on  counseling/education with patient today.  For questions or updates, please contact [place] Please consult [email or web address] for contact info under        Signed, Buford Dresser, MD  02/18/2022, 2:34 PM

## 2022-02-18 NOTE — Progress Notes (Signed)
PROGRESS NOTE    Lawrence Murray  JKK:[id number] DOB: [birth year] DOA: 02/05/2022 PCP: Pcp, No    Brief Narrative:   56 y.o. male who has not seen a doctor in many years, presented to the hospital because of shortness of breath and significant swelling in the lower extremities.   He was admitted to the hospital for acute on chronic systolic and diastolic CHF with anasarca.  2D echo showed estimated EF of 30 to 71% but LV diastolic function could not be evaluated.  He was treated with IV Lasix.  BP was soft so midodrine was started to allow for adequate diuresis.  Troponin was elevated but this was attributed to demand ischemia.  Cardiologist recommended left and right heart cath once he is euvolemic.  There was concern for peripheral arterial disease.  Patient underwent angiogram with vascular surgery on 6/28.  Good arterial flow noted.  No intervention was indicated.  Volume status is markedly improved.  As of 6/30 patient is 25.5 L net negative.  Symptomatically improved.   Assessment & Plan:   Principal Problem:   Acute on chronic combined systolic and diastolic CHF (congestive heart failure) (HCC) Active Problems:   Smoker   Type 2 diabetes mellitus with hyperglycemia (HCC)   PVD (peripheral vascular disease) (HCC)   Acute HFrEF (heart failure with reduced ejection fraction) (HCC)  Acute on chronic systolic and diastolic congestive heart failure Anasarca 2D echocardiogram EF 30 to 69% Diastolic dysfunction indeterminate, suspect some element of diastolic dysfunction 67.8 L net negative since admission Plan: Continue Lasix 80 mg IV twice daily Daily aldactone Daily renal function, creatinine remained stable Continue midodrine 2.5 3 times daily Left and right heart cath early next week when more euvolemic Unable to add GDMT at this time  Hypotension BP remains soft Continue midodrine 2.5 3 times daily Hold GDMT  Elevated troponin Likely secondary demand ischemia No  indication of ACS  Bilateral pleural effusions Likely secondary to decompensated heart failure Patient on room air, clear lungs  Type 2 diabetes mellitus Hemoglobin A1c 7.7 SSI with NovoLog Continue empagliflozin We will consider metformin at discharge  Peripheral vascular disease Peripheral neuropathy Lower extremity angiogram reassuring Gabapentin/robaxin Avoid narcotics  Anxiety Seroquel as needed as needed Xanax  Tobacco use disorder Counseled patient  DVT prophylaxis: Lovenox Code Status: Full Family Communication: None today Disposition Plan: Status is: Inpatient Remains inpatient appropriate because: Acute decompensated heart failure on IV diuretics.  Remains anasarcic.  Plan for L/RHC early/mid week   Level of care: Telemetry Cardiac  Consultants:  Cardiology Vascular surgery  Procedures:  Lower extremity angiography 6/28  Antimicrobials: None   Subjective: Seen and examined.  Sitting up in chair.  No visible distress.  Complains of shooting pain in bilateral lower extremities  Objective: Vitals:   02/18/22 0417 02/18/22 0500 02/18/22 0748 02/18/22 1150  BP: (!) 95/59  91/71 90/60  Pulse: 89  83 84  Resp: '20  18 18  '$ Temp: 97.9 F (36.6 C)     TempSrc:      SpO2: 94%  96% 98%  Weight:  105 kg    Height:        Intake/Output Summary (Last 24 hours) at 02/18/2022 1155 Last data filed at 02/18/2022 1100 Gross per 24 hour  Intake 240 ml  Output 2425 ml  Net -2185 ml   Filed Weights   02/15/22 0333 02/16/22 0415 02/18/22 0500  Weight: 105.3 kg 105.5 kg 105 kg    Examination:  General exam: NAD  Respiratory system: Lungs clear.  No work of breathing.  Room air Cardiovascular system: S1-S2, RRR, no murmurs, 2+ pedal edema BLE, legs in wraps Gastrointestinal system: Soft, NT/ND, normal bowel sounds Central nervous system: Alert and oriented. No focal neurological deficits. Extremities: Symmetric 5 x 5 power. Skin: No rashes, lesions or  ulcers Psychiatry: Judgement and insight appear normal. Mood & affect appropriate.     Data Reviewed: I have personally reviewed following labs and imaging studies  CBC: Recent Labs  Lab 02/12/22 0613 02/13/22 1151 02/16/22 0619  WBC 6.2 7.8 8.1  HGB 11.7* 12.9* 13.4  HCT 36.2* 39.4 41.1  MCV 96.5 95.4 95.4  PLT 193 240 169   Basic Metabolic Panel: Recent Labs  Lab 02/12/22 0613 02/13/22 0623 02/13/22 1151 02/14/22 0613 02/15/22 0817 02/17/22 0632 02/18/22 0809  NA 137 137 136 137 136 136 136  K 3.6 3.7 4.0 4.2 3.9 4.0 3.8  CL 97* 97* 94* 96* 95* 93* 94*  CO2 31 32 32 30 30 32 32  GLUCOSE 125* 124* 183* 165* 150* 150* 150*  BUN 24* 25* 25* 29* 28* 29* 31*  CREATININE 1.04 1.16 1.16 1.05 1.09 1.07 0.99  CALCIUM 8.9 9.0 9.0 9.4 9.2 9.0 9.1  MG 2.4 2.4  --  2.4  --   --   --    GFR: Estimated Creatinine Clearance: 103.5 mL/min (by C-G formula based on SCr of 0.99 mg/dL). Liver Function Tests: No results for input(s): "AST", "ALT", "ALKPHOS", "BILITOT", "PROT", "ALBUMIN" in the last 168 hours.  No results for input(s): "LIPASE", "AMYLASE" in the last 168 hours. No results for input(s): "AMMONIA" in the last 168 hours. Coagulation Profile: No results for input(s): "INR", "PROTIME" in the last 168 hours. Cardiac Enzymes: No results for input(s): "CKTOTAL", "CKMB", "CKMBINDEX", "TROPONINI" in the last 168 hours. BNP (last 3 results) No results for input(s): "PROBNP" in the last 8760 hours. HbA1C: No results for input(s): "HGBA1C" in the last 72 hours. CBG: Recent Labs  Lab 02/17/22 1148 02/17/22 1622 02/17/22 2226 02/18/22 0747 02/18/22 1149  GLUCAP 197* 148* 224* 153* 310*   Lipid Profile: No results for input(s): "CHOL", "HDL", "LDLCALC", "TRIG", "CHOLHDL", "LDLDIRECT" in the last 72 hours. Thyroid Function Tests: No results for input(s): "TSH", "T4TOTAL", "FREET4", "T3FREE", "THYROIDAB" in the last 72 hours. Anemia Panel: No results for input(s):  "VITAMINB12", "FOLATE", "FERRITIN", "TIBC", "IRON", "RETICCTPCT" in the last 72 hours. Sepsis Labs: Recent Labs  Lab 02/16/22 6789  PROCALCITON <0.10    Recent Results (from the past 240 hour(s))  Aerobic Culture w Gram Stain (superficial specimen)     Status: Abnormal (Preliminary result)   Collection Time: 02/15/22  2:20 PM   Specimen: Wound  Result Value Ref Range Status   Specimen Description   Final    WOUND Performed at [facility], [address], [place], [place] [address]    Special Requests   Final    HS STER Performed at [facility], [address] [place], [place] [address]    Gram Stain   Final    NO SQUAMOUS EPITHELIAL CELLS SEEN NO WBC SEEN ABUNDANT GRAM NEGATIVE RODS ABUNDANT GRAM POSITIVE COCCI Performed at [facility], [place] [address] [place],  [address]    Culture MULTIPLE ORGANISMS PRESENT, NONE PREDOMINANT (A)  Final   Report Status PENDING  Incomplete         Radiology Studies: No results found.      Scheduled Meds:  aspirin EC  81 mg Oral Daily   empagliflozin  10 mg Oral Daily   enoxaparin (LOVENOX) injection  0.5 mg/kg Subcutaneous Q24H   furosemide  80 mg Intravenous BID   gabapentin  600 mg Oral TID   insulin aspart  0-15 Units Subcutaneous TID WC   insulin aspart  0-5 Units Subcutaneous QHS   insulin aspart  3 Units Subcutaneous TID WC   methocarbamol  750 mg Oral QID   midodrine  2.5 mg Oral BID WC   pantoprazole  40 mg Oral Daily   potassium chloride  40 mEq Oral Once   QUEtiapine  25 mg Oral QHS   spironolactone  12.5 mg Oral Daily   Continuous Infusions:   LOS: 13 days      Sidney Ace, MD [facility]   If 7PM-7AM, please contact night-coverage  02/18/2022, 11:55 AM

## 2022-02-19 DIAGNOSIS — I5043 Acute on chronic combined systolic (congestive) and diastolic (congestive) heart failure: Secondary | ICD-10-CM | POA: Diagnosis not present

## 2022-02-19 DIAGNOSIS — I509 Heart failure, unspecified: Secondary | ICD-10-CM | POA: Diagnosis not present

## 2022-02-19 LAB — BASIC METABOLIC PANEL
Anion gap: 12 (ref 5–15)
BUN: 31 mg/dL — ABNORMAL HIGH (ref 6–20)
CO2: 31 mmol/L (ref 22–32)
Calcium: 9.1 mg/dL (ref 8.9–10.3)
Chloride: 95 mmol/L — ABNORMAL LOW (ref 98–111)
Creatinine, Ser: 1.09 mg/dL (ref 0.61–1.24)
GFR, Estimated: >60 mL/min (ref >60)
Glucose, Bld: 210 mg/dL — ABNORMAL HIGH (ref 70–99)
Potassium: 4 mmol/L (ref 3.5–5.1)
Sodium: 138 mmol/L (ref 135–145)

## 2022-02-19 LAB — GLUCOSE, CAPILLARY
Glucose-Capillary: 246 mg/dL — ABNORMAL HIGH (ref 70–99)
Glucose-Capillary: 198 mg/dL — ABNORMAL HIGH (ref 70–99)
Glucose-Capillary: 233 mg/dL — ABNORMAL HIGH (ref 70–99)

## 2022-02-19 MED ORDER — GABAPENTIN 400 MG PO CAPS: 800.0000 mg | ORAL_CAPSULE | Freq: Three times a day (TID) | ORAL | Status: DC

## 2022-02-19 MED ORDER — PREGABALIN 50 MG PO CAPS
100.0000 mg | ORAL_CAPSULE | Freq: Three times a day (TID) | ORAL | Status: DC
  Administered 2022-02-19 – 2022-03-01 (×31): 100 mg via ORAL
  Filled 2022-02-19 (×31): qty 2

## 2022-02-19 MED ORDER — GABAPENTIN 800 MG PO TABS
800.0000 mg | ORAL_TABLET | Freq: Three times a day (TID) | ORAL | Status: DC
  Filled 2022-02-19: qty 1

## 2022-02-19 NOTE — Progress Notes (Signed)
PROGRESS NOTE    Lawrence Murray  KVQ:[id number] DOB: [birth year] DOA: 02/05/2022 PCP: Pcp, No    Brief Narrative:   56 y.o. male who has not seen a doctor in many years, presented to the hospital because of shortness of breath and significant swelling in the lower extremities.   He was admitted to the hospital for acute on chronic systolic and diastolic CHF with anasarca.  2D echo showed estimated EF of 30 to 64% but LV diastolic function could not be evaluated.  He was treated with IV Lasix.  BP was soft so midodrine was started to allow for adequate diuresis.  Troponin was elevated but this was attributed to demand ischemia.  Cardiologist recommended left and right heart cath once he is euvolemic.  There was concern for peripheral arterial disease.  Patient underwent angiogram with vascular surgery on 6/28.  Good arterial flow noted.  No intervention was indicated.  Volume status is markedly improved.  As of 6/30 patient is 25.5 L net negative.  Symptomatically improved.  7/3, patient tolerating diuretics well.  Focused on shooting pain in bilateral lower extremities.   Assessment & Plan:   Principal Problem:   Acute on chronic combined systolic and diastolic CHF (congestive heart failure) (HCC) Active Problems:   Smoker   Type 2 diabetes mellitus with hyperglycemia (HCC)   PVD (peripheral vascular disease) (HCC)   Acute HFrEF (heart failure with reduced ejection fraction) (HCC)  Acute on chronic systolic and diastolic congestive heart failure Anasarca 2D echocardiogram EF 30 to 33% Diastolic dysfunction indeterminate, suspect some element of diastolic dysfunction 29.5 L net negative since admission Plan: Continue Lasix 80 mg IV twice daily Daily aldactone Daily renal function, creatinine remained stable Continue midodrine 2.5 3 times daily Left and right heart cath early next week when more euvolemic Unable to add GDMT at this time   Hypotension BP remains soft Continue  midodrine 2.5 3 times daily Hold GDMT  Elevated troponin Likely secondary demand ischemia No indication of ACS  Bilateral pleural effusions Likely secondary to decompensated heart failure Patient on room air, clear lungs  Type 2 diabetes mellitus Hemoglobin A1c 7.7 SSI with NovoLog Continue empagliflozin We will consider metformin at discharge  Peripheral vascular disease Peripheral neuropathy Lower extremity angiogram reassuring Gabapentin not effective DC gabapentin Add Lyrica 100 3 times daily Continue Robaxin Avoid narcotics  Anxiety Seroquel as needed as needed Xanax  Tobacco use disorder Counseled patient  DVT prophylaxis: Lovenox Code Status: Full Family Communication: None today Disposition Plan: Status is: Inpatient Remains inpatient appropriate because: Acute decompensated heart failure on IV diuretics.  Remains anasarcic.  Plan for [facility] later this week   Level of care: Telemetry Cardiac  Consultants:  Cardiology Vascular surgery  Procedures:  Lower extremity angiography 6/28  Antimicrobials: None   Subjective: Seen and examined.  Sitting up in chair.  No visible distress.  Complains of shooting pain in bilateral lower extremities  Objective: Vitals:   02/19/22 0010 02/19/22 0338 02/19/22 0833 02/19/22 1202  BP: '95/66 99/67 97/68 '$ (!) 83/61  Pulse: 95 90 96 91  Resp: '20 20 14 18  '$ Temp: 98.5 F (36.9 C) 98.6 F (37 C) 98.2 F (36.8 C) 98.4 F (36.9 C)  TempSrc: Oral Oral    SpO2: 98% 96% 97% 94%  Weight:      Height:        Intake/Output Summary (Last 24 hours) at 02/19/2022 1315 Last data filed at 02/19/2022 1200 Gross per 24 hour  Intake 840  ml  Output 3150 ml  Net -2310 ml   Filed Weights   02/15/22 0333 02/16/22 0415 02/18/22 0500  Weight: 105.3 kg 105.5 kg 105 kg    Examination:  General exam: No acute distress Respiratory system: Lungs clear.  No work of breathing.  Room air Cardiovascular system: S1-S2, RRR, no  murmurs, 2+ pedal edema BLE, legs in wraps Gastrointestinal system: Soft, NT/ND, normal bowel sounds Central nervous system: Alert and oriented. No focal neurological deficits. Extremities: Symmetric 5 x 5 power. Skin: No rashes, lesions or ulcers Psychiatry: Judgement and insight appear normal. Mood & affect appropriate.     Data Reviewed: I have personally reviewed following labs and imaging studies  CBC: Recent Labs  Lab 02/13/22 1151 02/16/22 0619  WBC 7.8 8.1  HGB 12.9* 13.4  HCT 39.4 41.1  MCV 95.4 95.4  PLT 240 211   Basic Metabolic Panel: Recent Labs  Lab 02/13/22 0623 02/13/22 1151 02/14/22 0613 02/15/22 0817 02/17/22 0632 02/18/22 0809 02/19/22 0458  NA 137   < > 137 136 136 136 138  K 3.7   < > 4.2 3.9 4.0 3.8 4.0  CL 97*   < > 96* 95* 93* 94* 95*  CO2 32   < > 30 30 32 32 31  GLUCOSE 124*   < > 165* 150* 150* 150* 210*  BUN 25*   < > 29* 28* 29* 31* 31*  CREATININE 1.16   < > 1.05 1.09 1.07 0.99 1.09  CALCIUM 9.0   < > 9.4 9.2 9.0 9.1 9.1  MG 2.4  --  2.4  --   --   --   --    < > = values in this interval not displayed.   GFR: Estimated Creatinine Clearance: 94 mL/min (by C-G formula based on SCr of 1.09 mg/dL). Liver Function Tests: No results for input(s): "AST", "ALT", "ALKPHOS", "BILITOT", "PROT", "ALBUMIN" in the last 168 hours.  No results for input(s): "LIPASE", "AMYLASE" in the last 168 hours. No results for input(s): "AMMONIA" in the last 168 hours. Coagulation Profile: No results for input(s): "INR", "PROTIME" in the last 168 hours. Cardiac Enzymes: No results for input(s): "CKTOTAL", "CKMB", "CKMBINDEX", "TROPONINI" in the last 168 hours. BNP (last 3 results) No results for input(s): "PROBNP" in the last 8760 hours. HbA1C: No results for input(s): "HGBA1C" in the last 72 hours. CBG: Recent Labs  Lab 02/18/22 1149 02/18/22 1546 02/18/22 2106 02/19/22 0834 02/19/22 1204  GLUCAP 310* 136* 252* 246* 198*   Lipid Profile: No  results for input(s): "CHOL", "HDL", "LDLCALC", "TRIG", "CHOLHDL", "LDLDIRECT" in the last 72 hours. Thyroid Function Tests: No results for input(s): "TSH", "T4TOTAL", "FREET4", "T3FREE", "THYROIDAB" in the last 72 hours. Anemia Panel: No results for input(s): "VITAMINB12", "FOLATE", "FERRITIN", "TIBC", "IRON", "RETICCTPCT" in the last 72 hours. Sepsis Labs: Recent Labs  Lab 02/16/22 9417  PROCALCITON <0.10    Recent Results (from the past 240 hour(s))  Aerobic Culture w Gram Stain (superficial specimen)     Status: Abnormal   Collection Time: 02/15/22  2:20 PM   Specimen: Wound  Result Value Ref Range Status   Specimen Description   Final    WOUND Performed at [facility], [address], [place], [place] [address]    Special Requests   Final    HS STER Performed at [facility], [address] [place], [place] [address]    Gram Stain   Final    NO SQUAMOUS EPITHELIAL CELLS  SEEN NO WBC SEEN ABUNDANT GRAM NEGATIVE RODS ABUNDANT GRAM POSITIVE COCCI    Culture (A)  Final    MULTIPLE ORGANISMS PRESENT, NONE PREDOMINANT NO STAPHYLOCOCCUS AUREUS ISOLATED NO GROUP A STREP (S.PYOGENES) ISOLATED Performed at [facility], [address], [place], [place] [address]    Report Status 02/18/2022 FINAL  Final         Radiology Studies: No results found.      Scheduled Meds:  aspirin EC  81 mg Oral Daily   empagliflozin  10 mg Oral Daily   enoxaparin (LOVENOX) injection  0.5 mg/kg Subcutaneous Q24H   furosemide  80 mg Intravenous BID   gabapentin  800 mg Oral TID   insulin aspart  0-15 Units Subcutaneous TID WC   insulin aspart  0-5 Units Subcutaneous QHS   insulin aspart  3 Units Subcutaneous TID WC   methocarbamol  750 mg Oral QID   midodrine  2.5 mg Oral BID WC   pantoprazole  40 mg Oral Daily   QUEtiapine  25 mg Oral QHS   spironolactone  12.5 mg Oral Daily   Continuous Infusions:   LOS: 14 days      Sidney Ace,  MD [facility]   If 7PM-7AM, please contact night-coverage  02/19/2022, 1:15 PM

## 2022-02-19 NOTE — Progress Notes (Signed)
Progress Note  Patient Name: Lawrence Murray Date of Encounter: 02/19/2022  [facility] Cardiologist: Kate Sable, MD   Subjective   Dyspnea continues to improve. No chest pain. Main complaint continues to revolve around lower extremity pain described as "sticking your feet in a yellow jackets nest and a volcano."  Documented UOP 2.8 L for the past 24 hours, net - 29.8 L for the admission. Renal function stable. BP soft, though stable.   Inpatient Medications    Scheduled Meds:  aspirin EC  81 mg Oral Daily   empagliflozin  10 mg Oral Daily   enoxaparin (LOVENOX) injection  0.5 mg/kg Subcutaneous Q24H   furosemide  80 mg Intravenous BID   gabapentin  600 mg Oral TID   insulin aspart  0-15 Units Subcutaneous TID WC   insulin aspart  0-5 Units Subcutaneous QHS   insulin aspart  3 Units Subcutaneous TID WC   methocarbamol  750 mg Oral QID   midodrine  2.5 mg Oral BID WC   pantoprazole  40 mg Oral Daily   QUEtiapine  25 mg Oral QHS   spironolactone  12.5 mg Oral Daily   Continuous Infusions:  PRN Meds: acetaminophen, ALPRAZolam, ondansetron (ZOFRAN) IV, ondansetron (ZOFRAN) IV   Vital Signs    Vitals:   02/18/22 1548 02/18/22 1921 02/19/22 0010 02/19/22 0338  BP: 91/63 1'01/62 95/66 99/67 '$  Pulse: 94 100 95 90  Resp: '19 20 20 20  '$ Temp: 98.1 F (36.7 C) 98.7 F (37.1 C) 98.5 F (36.9 C) 98.6 F (37 C)  TempSrc:  Oral Oral Oral  SpO2: 93% 100% 98% 96%  Weight:      Height:        Intake/Output Summary (Last 24 hours) at 02/19/2022 0830 Last data filed at 02/19/2022 0355 Gross per 24 hour  Intake 600 ml  Output 3450 ml  Net -2850 ml       02/18/2022    5:00 AM 02/16/2022    4:15 AM 02/15/2022    3:33 AM  Last 3 Weights  Weight (lbs) 231 lb 7.7 oz 232 lb 9.4 oz 232 lb 1.6 oz  Weight (kg) 105 kg 105.5 kg 105.28 kg      Telemetry    SR with rare PVCs - Personally Reviewed  ECG    No new - Personally Reviewed  Physical Exam   GEN: No acute distress.    Neck: No JVD sitting at 90 degrees Cardiac: RRR, no murmurs, rubs, or gallops.  Respiratory: Clear to auscultation bilaterally, improved at bases GI: Soft, nontender, non-distended  MS: Bilateral 2+ pitting LE edema despite unna boots Neuro:  Nonfocal  Psych: Normal affect   Labs    High Sensitivity Troponin:   Recent Labs  Lab 02/05/22 0218 02/05/22 0403  TROPONINIHS 370* 367*      Chemistry Recent Labs  Lab 02/13/22 0623 02/13/22 1151 02/14/22 0613 02/15/22 0817 02/17/22 0632 02/18/22 0809 02/19/22 0458  NA 137   < > 137   < > 136 136 138  K 3.7   < > 4.2   < > 4.0 3.8 4.0  CL 97*   < > 96*   < > 93* 94* 95*  CO2 32   < > 30   < > 32 32 31  GLUCOSE 124*   < > 165*   < > 150* 150* 210*  BUN 25*   < > 29*   < > 29* 31* 31*  CREATININE 1.16   < >  1.05   < > 1.07 0.99 1.09  CALCIUM 9.0   < > 9.4   < > 9.0 9.1 9.1  MG 2.4  --  2.4  --   --   --   --   GFRNONAA >60   < > >60   < > >60 >60 >60  ANIONGAP 8   < > 11   < > '11 10 12   '$ < > = values in this interval not displayed.     Lipids No results for input(s): "CHOL", "TRIG", "HDL", "LABVLDL", "LDLCALC", "CHOLHDL" in the last 168 hours.  Hematology Recent Labs  Lab 02/13/22 1151 02/16/22 0619  WBC 7.8 8.1  RBC 4.13* 4.31  HGB 12.9* 13.4  HCT 39.4 41.1  MCV 95.4 95.4  MCH 31.2 31.1  MCHC 32.7 32.6  RDW 15.9* 15.6*  PLT 240 303    Thyroid No results for input(s): "TSH", "FREET4" in the last 168 hours.  BNPNo results for input(s): "BNP", "PROBNP" in the last 168 hours.  DDimer No results for input(s): "DDIMER" in the last 168 hours.   Radiology    No results found.  Cardiac Studies   Echocardiogram completed on 02/06/2022 1. Left ventricular ejection fraction, by estimation, is 30 to 35%. The  left ventricle has moderately decreased function. The left ventricle  demonstrates global hypokinesis. Left ventricular diastolic function could  not be evaluated.   2. RV not well visualized but likely  enlarged.   3. Large pleural effusion.   4. The mitral valve is normal in structure. Mild mitral valve  regurgitation.   5. The aortic valve is normal in structure. Aortic valve regurgitation is  not visualized.   Patient Profile     56 y.o. male with history of obesity and tobacco abuse, diagnosed with acute HFrEF and LVEF of 30-35%.  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 -Tolerating only spironolactone and empagliflozin currently -Add beta blocker and ARB when able, ideally would like to transition off midodrine long term -Admission weight 145.2 kg, weight 7/2 of 105 kg, with weight pending this morning, charted net negative > 29 L -Continue furosemide 80 mg IV BID with stable renal function -Planning for [facility] once euvolemic, expect later this week. Will need medication to tolerate, did well with cocktail he received during peripheral angiography -Continue empagliflozin 10 mg daily, spironolactone 12.5 mg daily -Will hold on digoxin at this time as long as urine output remains brisk and renal function stable   Type II diabetes -A1c 7.7 -Started on empagliflozin -Has SSI while admitted -On aspirin  Hypotension: -Stable, asymptomatic  -Wean off midodrine as able     For questions or updates, please contact [facility] Please consult [email or web address] for contact info under        Signed, Christell Faith, PA-C  02/19/2022, 8:30 AM

## 2022-02-19 NOTE — Plan of Care (Signed)

## 2022-02-20 DIAGNOSIS — I509 Heart failure, unspecified: Secondary | ICD-10-CM | POA: Diagnosis not present

## 2022-02-20 DIAGNOSIS — I2510 Atherosclerotic heart disease of native coronary artery without angina pectoris: Secondary | ICD-10-CM | POA: Diagnosis not present

## 2022-02-20 DIAGNOSIS — I5043 Acute on chronic combined systolic (congestive) and diastolic (congestive) heart failure: Secondary | ICD-10-CM | POA: Diagnosis not present

## 2022-02-20 DIAGNOSIS — E1165 Type 2 diabetes mellitus with hyperglycemia: Secondary | ICD-10-CM | POA: Diagnosis not present

## 2022-02-20 DIAGNOSIS — I5021 Acute systolic (congestive) heart failure: Secondary | ICD-10-CM | POA: Diagnosis not present

## 2022-02-20 LAB — BASIC METABOLIC PANEL
Anion gap: 9 (ref 5–15)
BUN: 33 mg/dL — ABNORMAL HIGH (ref 6–20)
CO2: 31 mmol/L (ref 22–32)
Calcium: 9.1 mg/dL (ref 8.9–10.3)
Chloride: 95 mmol/L — ABNORMAL LOW (ref 98–111)
Creatinine, Ser: 1.04 mg/dL (ref 0.61–1.24)
GFR, Estimated: >60 mL/min (ref >60)
Glucose, Bld: 186 mg/dL — ABNORMAL HIGH (ref 70–99)
Potassium: 4.3 mmol/L (ref 3.5–5.1)
Sodium: 135 mmol/L (ref 135–145)

## 2022-02-20 LAB — GLUCOSE, CAPILLARY
Glucose-Capillary: 194 mg/dL — ABNORMAL HIGH (ref 70–99)
Glucose-Capillary: 245 mg/dL — ABNORMAL HIGH (ref 70–99)
Glucose-Capillary: 166 mg/dL — ABNORMAL HIGH (ref 70–99)
Glucose-Capillary: 194 mg/dL — ABNORMAL HIGH (ref 70–99)

## 2022-02-20 MED ORDER — SODIUM CHLORIDE 0.9 % IV SOLN: 250.0000 mL | INTRAVENOUS | Status: DC | PRN

## 2022-02-20 MED ORDER — SODIUM CHLORIDE 0.9 % IV SOLN
INTRAVENOUS | Status: DC
Start: 2022-02-21 — End: 2022-02-21

## 2022-02-20 MED ORDER — DIGOXIN 125 MCG PO TABS
0.1250 mg | ORAL_TABLET | Freq: Every day | ORAL | Status: DC
  Administered 2022-02-20 – 2022-03-01 (×9): 0.125 mg via ORAL
  Filled 2022-02-20 (×10): qty 1

## 2022-02-20 MED ORDER — SODIUM CHLORIDE 0.9% FLUSH: 3.0000 mL | INTRAVENOUS | Status: DC | PRN

## 2022-02-20 MED ORDER — SODIUM CHLORIDE 0.9% FLUSH
3.0000 mL | Freq: Two times a day (BID) | INTRAVENOUS | Status: DC
  Administered 2022-02-20 – 2022-02-21 (×2): 3 mL via INTRAVENOUS

## 2022-02-20 MED ORDER — ASPIRIN 81 MG PO CHEW
81.0000 mg | CHEWABLE_TABLET | ORAL | Status: AC
Start: 2022-02-21 — End: 2022-02-21
  Administered 2022-02-21: 81 mg via ORAL
  Filled 2022-02-20: qty 1

## 2022-02-20 NOTE — Progress Notes (Signed)
Progress Note  Patient Name: Lawrence Murray Date of Encounter: 02/20/2022  [facility] Cardiologist: Kate Sable, MD   Subjective   Legs still hurt but seem to be improved a little bit today.  No shortness of breath reported.  [name] continues to have some pain in his ribs and abdominal muscles but no anginal chest pain.  Inpatient Medications    Scheduled Meds:  aspirin EC  81 mg Oral Daily   digoxin  0.125 mg Oral Daily   empagliflozin  10 mg Oral Daily   enoxaparin (LOVENOX) injection  0.5 mg/kg Subcutaneous Q24H   furosemide  80 mg Intravenous BID   insulin aspart  0-15 Units Subcutaneous TID WC   insulin aspart  0-5 Units Subcutaneous QHS   insulin aspart  3 Units Subcutaneous TID WC   methocarbamol  750 mg Oral QID   midodrine  2.5 mg Oral BID WC   pantoprazole  40 mg Oral Daily   pregabalin  100 mg Oral TID   QUEtiapine  25 mg Oral QHS   spironolactone  12.5 mg Oral Daily   Continuous Infusions:  PRN Meds: acetaminophen, ALPRAZolam, ondansetron (ZOFRAN) IV, ondansetron (ZOFRAN) IV   Vital Signs    Vitals:   02/19/22 2350 02/20/22 0429 02/20/22 0431 02/20/22 0717  BP: 101/68 (!) '89/66 91/62 91/62 '$  Pulse: 95 94 91 91  Resp: '16 16  18  '$ Temp: 97.9 F (36.6 C) 98 F (36.7 C)  98.7 F (37.1 C)  TempSrc:      SpO2: 100% 96%  97%  Weight:   106.3 kg   Height:        Intake/Output Summary (Last 24 hours) at 02/20/2022 0942 Last data filed at 02/20/2022 0840 Gross per 24 hour  Intake 240 ml  Output 2590 ml  Net -2350 ml      02/20/2022    4:31 AM 02/18/2022    5:00 AM 02/16/2022    4:15 AM  Last 3 Weights  Weight (lbs) 234 lb 6.4 oz 231 lb 7.7 oz 232 lb 9.4 oz  Weight (kg) 106.323 kg 105 kg 105.5 kg      Telemetry    Normal sinus rhythm - Personally Reviewed  ECG    No new tracing.  Physical Exam   GEN: No acute distress.   Neck: No gross JVD though body habitus limits evaluation Cardiac: RRR, no murmurs, rubs, or gallops.   Respiratory: Mildly diminished breath sounds at right base.  No wheezes or crackles. GI: Soft, nontender, non-distended  MS: 1+ proximal pretibial edema bilaterally.  Distal calves and ankles remain wrapped; No deformity. Neuro:  Nonfocal  Psych: Normal affect   Labs    High Sensitivity Troponin:   Recent Labs  Lab 02/05/22 0218 02/05/22 0403  TROPONINIHS 370* 367*     Chemistry Recent Labs  Lab 02/14/22 0613 02/15/22 0817 02/18/22 0809 02/19/22 0458 02/20/22 0603  NA 137   < > 136 138 135  K 4.2   < > 3.8 4.0 4.3  CL 96*   < > 94* 95* 95*  CO2 30   < > 32 31 31  GLUCOSE 165*   < > 150* 210* 186*  BUN 29*   < > 31* 31* 33*  CREATININE 1.05   < > 0.99 1.09 1.04  CALCIUM 9.4   < > 9.1 9.1 9.1  MG 2.4  --   --   --   --   GFRNONAA >60   < > >  60 >60 >60  ANIONGAP 11   < > '10 12 9   '$ < > = values in this interval not displayed.    Lipids No results for input(s): "CHOL", "TRIG", "HDL", "LABVLDL", "LDLCALC", "CHOLHDL" in the last 168 hours.  Hematology Recent Labs  Lab 02/13/22 1151 02/16/22 0619  WBC 7.8 8.1  RBC 4.13* 4.31  HGB 12.9* 13.4  HCT 39.4 41.1  MCV 95.4 95.4  MCH 31.2 31.1  MCHC 32.7 32.6  RDW 15.9* 15.6*  PLT 240 303   Thyroid No results for input(s): "TSH", "FREET4" in the last 168 hours.  BNPNo results for input(s): "BNP", "PROBNP" in the last 168 hours.  DDimer No results for input(s): "DDIMER" in the last 168 hours.   Radiology    No results found.  Cardiac Studies   TTE (02/06/2022): 1. Left ventricular ejection fraction, by estimation, is 30 to 35%. The  left ventricle has moderately decreased function. The left ventricle  demonstrates global hypokinesis. Left ventricular diastolic function could  not be evaluated.   2. RV not well visualized but likely enlarged.   3. Large pleural effusion.   4. The mitral valve is normal in structure. Mild mitral valve  regurgitation.   5. The aortic valve is normal in structure. Aortic valve  regurgitation is  not visualized.   Patient Profile     56 y.o. male with history of tobacco abuse and obesity, admitted with acute HFrEF (LVEF 30-35%).  Assessment & Plan    Acute HFrEF: Volume status continues to improve.  Patient was net -2.5 L yesterday and is net -28 L this admission.  Notably, weight is up 3 pounds since yesterday.  He still has some leg edema though this is improving.  We will plan to move forward with right and left heart catheterization with possible PCI tomorrow.  Soft blood pressure precludes escalation of GDMT, though we will add digoxin for some inotropic support. -Continue current doses of furosemide, spironolactone, and empagliflozin. -Continue midodrine for blood pressure support. -Add digoxin 125 mcg daily. -Proceed with right and left heart catheterization and possible PCI tomorrow.  Leg wounds: Likely due to severe leg edema on admission.  No evidence of arterial insufficiency by angiogram last week. -Continue diuresis and heart failure optimization. -Continue wound care. -Patient encouraged to elevate his legs, when possible.  Type 2 diabetes mellitus: -Continue SSI and empagliflozin. -Ongoing management per primary team.  Shared Decision Making/Informed Consent The risks [stroke (1 in 1000), death (1 in 1000), kidney failure [usually temporary] (1 in 500), bleeding (1 in 200), allergic reaction [possibly serious] (1 in 200)], benefits (diagnostic support and management of coronary artery disease) and alternatives of a cardiac catheterization were discussed in detail with Mr. Pine and he is willing to proceed.    For questions or updates, please contact [place] Please consult [email or web address] for contact info under [facility] Cardiology.   Signed, Nelva Bush, MD  02/20/2022, 9:42 AM

## 2022-02-20 NOTE — Progress Notes (Signed)
PROGRESS NOTE    Lawrence Murray  ZCH:[id number] DOB: 1965/11/26 DOA: 02/05/2022 PCP: Pcp, No    Brief Narrative:   56 y.o. male who has not seen a doctor in many years, presented to the hospital because of shortness of breath and significant swelling in the lower extremities.   He was admitted to the hospital for acute on chronic systolic and diastolic CHF with anasarca.  2D echo showed estimated EF of 30 to 28% but LV diastolic function could not be evaluated.  He was treated with IV Lasix.  BP was soft so midodrine was started to allow for adequate diuresis.  Troponin was elevated but this was attributed to demand ischemia.  Cardiologist recommended left and right heart cath once he is euvolemic.  There was concern for peripheral arterial disease.  Patient underwent angiogram with vascular surgery on 6/28.  Good arterial flow noted.  No intervention was indicated.  Volume status is markedly improved.  As of 6/30 patient is 25.5 L net negative.  Symptomatically improved.  7/3, patient tolerating diuretics well.  Focused on shooting pain in bilateral lower extremities.  7/4: Leg pain improved.  Seen by cardiology this morning.  Patient will be set up for left and right heart cath and possible PCI tomorrow 7/5   Assessment & Plan:   Principal Problem:   Acute on chronic combined systolic and diastolic CHF (congestive heart failure) (HCC) Active Problems:   Smoker   Type 2 diabetes mellitus with hyperglycemia (HCC)   PVD (peripheral vascular disease) (HCC)   Acute HFrEF (heart failure with reduced ejection fraction) (HCC)  Acute on chronic systolic and diastolic congestive heart failure Anasarca 2D echocardiogram EF 30 to 78% Diastolic dysfunction indeterminate, suspect some element of diastolic dysfunction 67.6 L net negative since admission Plan: Continue Lasix 80 mg IV twice daily Continue Daily aldactone Daily renal function, creatinine remained stable Continue midodrine 2.5 3  times daily Left and right heart cath tomorrow with possible PCI Unable to add GDMT at this time   Hypotension BP remains soft Continue midodrine 2.5 3 times daily Hold GDMT  Elevated troponin Likely secondary demand ischemia No indication of ACS  Bilateral pleural effusions Likely secondary to decompensated heart failure Patient on room air, clear lungs  Type 2 diabetes mellitus Hemoglobin A1c 7.7 SSI with NovoLog Continue empagliflozin We will consider metformin at discharge  Peripheral vascular disease Peripheral neuropathy Lower extremity angiogram reassuring Gabapentin not effective Seems to have been a result of Lyrica Plan: Continue Lyrica 100 3 times daily Continue Robaxin Avoid narcotics  Anxiety Seroquel as needed as needed Xanax  Tobacco use disorder Counseled patient  DVT prophylaxis: Lovenox Code Status: Full Family Communication: None today Disposition Plan: Status is: Inpatient Remains inpatient appropriate because: Acute decompensated heart failure on IV diuretics.  Volume status improving.  Plan for left and right heart cath 7/5   Level of care: Telemetry Cardiac  Consultants:  Cardiology  Procedures:  Lower extremity angiography 6/28  Antimicrobials: None   Subjective: Seen and examined.  Sitting in the chair.  No visible distress.  Endorses persistent shooting pain in bilateral lower extremities but seems improved from prior.  Objective: Vitals:   02/19/22 2350 02/20/22 0429 02/20/22 0431 02/20/22 0717  BP: 101/68 (!) '89/66 91/62 91/62 '$  Pulse: 95 94 91 91  Resp: '16 16  18  '$ Temp: 97.9 F (36.6 C) 98 F (36.7 C)  98.7 F (37.1 C)  TempSrc:      SpO2: 100% 96%  97%  Weight:   106.3 kg   Height:        Intake/Output Summary (Last 24 hours) at 02/20/2022 1204 Last data filed at 02/20/2022 1021 Gross per 24 hour  Intake 240 ml  Output 2290 ml  Net -2050 ml   Filed Weights   02/16/22 0415 02/18/22 0500 02/20/22 0431   Weight: 105.5 kg 105 kg 106.3 kg    Examination:  General exam: NAD.  Sitting up in chair Respiratory system: Lungs clear.  Normal work of breathing.  Room air  Cardiovascular system: S1-S2, RRR, no murmurs, 2+ pedal edema BLE, legs in wraps Gastrointestinal system: Soft, NT/ND, normal bowel sounds Central nervous system: Alert and oriented. No focal neurological deficits. Extremities: Symmetric 5 x 5 power. Skin: No rashes, lesions or ulcers Psychiatry: Judgement and insight appear normal. Mood & affect appropriate.     Data Reviewed: I have personally reviewed following labs and imaging studies  CBC: Recent Labs  Lab 02/16/22 0619  WBC 8.1  HGB 13.4  HCT 41.1  MCV 95.4  PLT 382   Basic Metabolic Panel: Recent Labs  Lab 02/14/22 0613 02/15/22 0817 02/17/22 0632 02/18/22 0809 02/19/22 0458 02/20/22 0603  NA 137 136 136 136 138 135  K 4.2 3.9 4.0 3.8 4.0 4.3  CL 96* 95* 93* 94* 95* 95*  CO2 30 30 32 32 31 31  GLUCOSE 165* 150* 150* 150* 210* 186*  BUN 29* 28* 29* 31* 31* 33*  CREATININE 1.05 1.09 1.07 0.99 1.09 1.04  CALCIUM 9.4 9.2 9.0 9.1 9.1 9.1  MG 2.4  --   --   --   --   --    GFR: Estimated Creatinine Clearance: 99.1 mL/min (by C-G formula based on SCr of 1.04 mg/dL). Liver Function Tests: No results for input(s): "AST", "ALT", "ALKPHOS", "BILITOT", "PROT", "ALBUMIN" in the last 168 hours.  No results for input(s): "LIPASE", "AMYLASE" in the last 168 hours. No results for input(s): "AMMONIA" in the last 168 hours. Coagulation Profile: No results for input(s): "INR", "PROTIME" in the last 168 hours. Cardiac Enzymes: No results for input(s): "CKTOTAL", "CKMB", "CKMBINDEX", "TROPONINI" in the last 168 hours. BNP (last 3 results) No results for input(s): "PROBNP" in the last 8760 hours. HbA1C: No results for input(s): "HGBA1C" in the last 72 hours. CBG: Recent Labs  Lab 02/18/22 2106 02/19/22 0834 02/19/22 1204 02/19/22 2048 02/20/22 0719   GLUCAP 252* 246* 198* 233* 194*   Lipid Profile: No results for input(s): "CHOL", "HDL", "LDLCALC", "TRIG", "CHOLHDL", "LDLDIRECT" in the last 72 hours. Thyroid Function Tests: No results for input(s): "TSH", "T4TOTAL", "FREET4", "T3FREE", "THYROIDAB" in the last 72 hours. Anemia Panel: No results for input(s): "VITAMINB12", "FOLATE", "FERRITIN", "TIBC", "IRON", "RETICCTPCT" in the last 72 hours. Sepsis Labs: Recent Labs  Lab 02/16/22 5053  PROCALCITON <0.10    Recent Results (from the past 240 hour(s))  Aerobic Culture w Gram Stain (superficial specimen)     Status: Abnormal   Collection Time: 02/15/22  2:20 PM   Specimen: Wound  Result Value Ref Range Status   Specimen Description   Final    WOUND Performed at [facility], [address], [place], [place] [address]    Special Requests   Final    HS STER Performed at [facility], [address] [place], [place] [address]    Gram Stain   Final    NO SQUAMOUS EPITHELIAL CELLS SEEN NO WBC SEEN ABUNDANT GRAM NEGATIVE RODS ABUNDANT GRAM POSITIVE  COCCI    Culture (A)  Final    MULTIPLE ORGANISMS PRESENT, NONE PREDOMINANT NO STAPHYLOCOCCUS AUREUS ISOLATED NO GROUP A STREP (S.PYOGENES) ISOLATED Performed at [facility], [place] [address], [place], [place] [address]    Report Status 02/18/2022 FINAL  Final         Radiology Studies: No results found.      Scheduled Meds:  aspirin EC  81 mg Oral Daily   digoxin  0.125 mg Oral Daily   empagliflozin  10 mg Oral Daily   enoxaparin (LOVENOX) injection  0.5 mg/kg Subcutaneous Q24H   furosemide  80 mg Intravenous BID   insulin aspart  0-15 Units Subcutaneous TID WC   insulin aspart  0-5 Units Subcutaneous QHS   insulin aspart  3 Units Subcutaneous TID WC   methocarbamol  750 mg Oral QID   midodrine  2.5 mg Oral BID WC   pantoprazole  40 mg Oral Daily   pregabalin  100 mg Oral TID   QUEtiapine  25 mg Oral QHS   spironolactone  12.5 mg  Oral Daily   Continuous Infusions:   LOS: 15 days      Sidney Ace, MD [facility]   If 7PM-7AM, please contact night-coverage  02/20/2022, 12:04 PM

## 2022-02-20 NOTE — Plan of Care (Signed)
  Problem: Education: Goal: Knowledge of General Education information will improve Description: Including pain rating scale, medication(s)/side effects and non-pharmacologic comfort measures Outcome: Progressing   Problem: Health Behavior/Discharge Planning: Goal: Ability to manage health-related needs will improve Outcome: Progressing   Problem: Clinical Measurements: Goal: Ability to maintain clinical measurements within normal limits will improve Outcome: Progressing Goal: Will remain free from infection Outcome: Progressing Goal: Diagnostic test results will improve Outcome: Progressing Goal: Respiratory complications will improve Outcome: Progressing Goal: Cardiovascular complication will be avoided Outcome: Progressing   Problem: Skin Integrity: Goal: Risk for impaired skin integrity will decrease Outcome: Progressing

## 2022-02-20 NOTE — H&P (View-Only) (Signed)
Progress Note  Patient Name: Lawrence Murray Date of Encounter: 02/20/2022  [facility] Cardiologist: Kate Sable, MD   Subjective   Legs still hurt but seem to be improved a little bit today.  No shortness of breath reported.  [name] continues to have some pain in his ribs and abdominal muscles but no anginal chest pain.  Inpatient Medications    Scheduled Meds:  aspirin EC  81 mg Oral Daily   digoxin  0.125 mg Oral Daily   empagliflozin  10 mg Oral Daily   enoxaparin (LOVENOX) injection  0.5 mg/kg Subcutaneous Q24H   furosemide  80 mg Intravenous BID   insulin aspart  0-15 Units Subcutaneous TID WC   insulin aspart  0-5 Units Subcutaneous QHS   insulin aspart  3 Units Subcutaneous TID WC   methocarbamol  750 mg Oral QID   midodrine  2.5 mg Oral BID WC   pantoprazole  40 mg Oral Daily   pregabalin  100 mg Oral TID   QUEtiapine  25 mg Oral QHS   spironolactone  12.5 mg Oral Daily   Continuous Infusions:  PRN Meds: acetaminophen, ALPRAZolam, ondansetron (ZOFRAN) IV, ondansetron (ZOFRAN) IV   Vital Signs    Vitals:   02/19/22 2350 02/20/22 0429 02/20/22 0431 02/20/22 0717  BP: 101/68 (!) '89/66 91/62 91/62 '$  Pulse: 95 94 91 91  Resp: '16 16  18  '$ Temp: 97.9 F (36.6 C) 98 F (36.7 C)  98.7 F (37.1 C)  TempSrc:      SpO2: 100% 96%  97%  Weight:   106.3 kg   Height:        Intake/Output Summary (Last 24 hours) at 02/20/2022 0942 Last data filed at 02/20/2022 0840 Gross per 24 hour  Intake 240 ml  Output 2590 ml  Net -2350 ml      02/20/2022    4:31 AM 02/18/2022    5:00 AM 02/16/2022    4:15 AM  Last 3 Weights  Weight (lbs) 234 lb 6.4 oz 231 lb 7.7 oz 232 lb 9.4 oz  Weight (kg) 106.323 kg 105 kg 105.5 kg      Telemetry    Normal sinus rhythm - Personally Reviewed  ECG    No new tracing.  Physical Exam   GEN: No acute distress.   Neck: No gross JVD though body habitus limits evaluation Cardiac: RRR, no murmurs, rubs, or gallops.   Respiratory: Mildly diminished breath sounds at right base.  No wheezes or crackles. GI: Soft, nontender, non-distended  MS: 1+ proximal pretibial edema bilaterally.  Distal calves and ankles remain wrapped; No deformity. Neuro:  Nonfocal  Psych: Normal affect   Labs    High Sensitivity Troponin:   Recent Labs  Lab 02/05/22 0218 02/05/22 0403  TROPONINIHS 370* 367*     Chemistry Recent Labs  Lab 02/14/22 0613 02/15/22 0817 02/18/22 0809 02/19/22 0458 02/20/22 0603  NA 137   < > 136 138 135  K 4.2   < > 3.8 4.0 4.3  CL 96*   < > 94* 95* 95*  CO2 30   < > 32 31 31  GLUCOSE 165*   < > 150* 210* 186*  BUN 29*   < > 31* 31* 33*  CREATININE 1.05   < > 0.99 1.09 1.04  CALCIUM 9.4   < > 9.1 9.1 9.1  MG 2.4  --   --   --   --   GFRNONAA >60   < > >  60 >60 >60  ANIONGAP 11   < > '10 12 9   '$ < > = values in this interval not displayed.    Lipids No results for input(s): "CHOL", "TRIG", "HDL", "LABVLDL", "LDLCALC", "CHOLHDL" in the last 168 hours.  Hematology Recent Labs  Lab 02/13/22 1151 02/16/22 0619  WBC 7.8 8.1  RBC 4.13* 4.31  HGB 12.9* 13.4  HCT 39.4 41.1  MCV 95.4 95.4  MCH 31.2 31.1  MCHC 32.7 32.6  RDW 15.9* 15.6*  PLT 240 303   Thyroid No results for input(s): "TSH", "FREET4" in the last 168 hours.  BNPNo results for input(s): "BNP", "PROBNP" in the last 168 hours.  DDimer No results for input(s): "DDIMER" in the last 168 hours.   Radiology    No results found.  Cardiac Studies   TTE (02/06/2022): 1. Left ventricular ejection fraction, by estimation, is 30 to 35%. The  left ventricle has moderately decreased function. The left ventricle  demonstrates global hypokinesis. Left ventricular diastolic function could  not be evaluated.   2. RV not well visualized but likely enlarged.   3. Large pleural effusion.   4. The mitral valve is normal in structure. Mild mitral valve  regurgitation.   5. The aortic valve is normal in structure. Aortic valve  regurgitation is  not visualized.   Patient Profile     56 y.o. male with history of tobacco abuse and obesity, admitted with acute HFrEF (LVEF 30-35%).  Assessment & Plan    Acute HFrEF: Volume status continues to improve.  Patient was net -2.5 L yesterday and is net -28 L this admission.  Notably, weight is up 3 pounds since yesterday.  He still has some leg edema though this is improving.  We will plan to move forward with right and left heart catheterization with possible PCI tomorrow.  Soft blood pressure precludes escalation of GDMT, though we will add digoxin for some inotropic support. -Continue current doses of furosemide, spironolactone, and empagliflozin. -Continue midodrine for blood pressure support. -Add digoxin 125 mcg daily. -Proceed with right and left heart catheterization and possible PCI tomorrow.  Leg wounds: Likely due to severe leg edema on admission.  No evidence of arterial insufficiency by angiogram last week. -Continue diuresis and heart failure optimization. -Continue wound care. -Patient encouraged to elevate his legs, when possible.  Type 2 diabetes mellitus: -Continue SSI and empagliflozin. -Ongoing management per primary team.  Shared Decision Making/Informed Consent The risks [stroke (1 in 1000), death (1 in 1000), kidney failure [usually temporary] (1 in 500), bleeding (1 in 200), allergic reaction [possibly serious] (1 in 200)], benefits (diagnostic support and management of coronary artery disease) and alternatives of a cardiac catheterization were discussed in detail with Mr. Harbuck and he is willing to proceed.    For questions or updates, please contact [place] Please consult [email or web address] for contact info under [facility] Cardiology.   Signed, Nelva Bush, MD  02/20/2022, 9:42 AM

## 2022-02-21 ENCOUNTER — Encounter
Admission: EM | Disposition: A | Discharge status: Home / Self Care | Payer: Self-pay | Source: Home / Self Care | Attending: Internal Medicine

## 2022-02-21 DIAGNOSIS — I5021 Acute systolic (congestive) heart failure: Secondary | ICD-10-CM | POA: Diagnosis not present

## 2022-02-21 DIAGNOSIS — I5043 Acute on chronic combined systolic (congestive) and diastolic (congestive) heart failure: Secondary | ICD-10-CM | POA: Diagnosis not present

## 2022-02-21 DIAGNOSIS — I2510 Atherosclerotic heart disease of native coronary artery without angina pectoris: Secondary | ICD-10-CM

## 2022-02-21 DIAGNOSIS — E1165 Type 2 diabetes mellitus with hyperglycemia: Secondary | ICD-10-CM | POA: Diagnosis not present

## 2022-02-21 HISTORY — PX: RIGHT/LEFT HEART CATH AND CORONARY ANGIOGRAPHY: CATH118266

## 2022-02-21 LAB — GLUCOSE, CAPILLARY
Glucose-Capillary: 180 mg/dL — ABNORMAL HIGH (ref 70–99)
Glucose-Capillary: 178 mg/dL — ABNORMAL HIGH (ref 70–99)
Glucose-Capillary: 174 mg/dL — ABNORMAL HIGH (ref 70–99)
Glucose-Capillary: 199 mg/dL — ABNORMAL HIGH (ref 70–99)
Glucose-Capillary: 183 mg/dL — ABNORMAL HIGH (ref 70–99)
Glucose-Capillary: 300 mg/dL — ABNORMAL HIGH (ref 70–99)

## 2022-02-21 LAB — BASIC METABOLIC PANEL
Anion gap: 8 (ref 5–15)
BUN: 32 mg/dL — ABNORMAL HIGH (ref 6–20)
CO2: 34 mmol/L — ABNORMAL HIGH (ref 22–32)
Calcium: 9.1 mg/dL (ref 8.9–10.3)
Chloride: 93 mmol/L — ABNORMAL LOW (ref 98–111)
Creatinine, Ser: 1.04 mg/dL (ref 0.61–1.24)
GFR, Estimated: >60 mL/min (ref >60)
Glucose, Bld: 176 mg/dL — ABNORMAL HIGH (ref 70–99)
Potassium: 4.1 mmol/L (ref 3.5–5.1)
Sodium: 135 mmol/L (ref 135–145)

## 2022-02-21 SURGERY — RIGHT/LEFT HEART CATH AND CORONARY ANGIOGRAPHY
Anesthesia: Moderate Sedation

## 2022-02-21 MED ORDER — LIDOCAINE HCL (PF) 1 % IJ SOLN
INTRAMUSCULAR | Status: DC | PRN
  Administered 2022-02-21: 5 mL

## 2022-02-21 MED ORDER — FENTANYL CITRATE (PF) 100 MCG/2ML IJ SOLN
INTRAMUSCULAR | Status: DC | PRN
  Administered 2022-02-21: 25 ug via INTRAVENOUS

## 2022-02-21 MED ORDER — HEPARIN (PORCINE) IN NACL 2000-0.9 UNIT/L-% IV SOLN
INTRAVENOUS | Status: DC | PRN
  Administered 2022-02-21: 1000 mL

## 2022-02-21 MED ORDER — OXYCODONE HCL 5 MG PO TABS
5.0000 mg | ORAL_TABLET | Freq: Four times a day (QID) | ORAL | Status: DC | PRN
  Administered 2022-02-21: 5 mg via ORAL
  Filled 2022-02-21: qty 1

## 2022-02-21 MED ORDER — HYDRALAZINE HCL 20 MG/ML IJ SOLN: 10.0000 mg | INTRAMUSCULAR | Status: AC | PRN

## 2022-02-21 MED ORDER — LIDOCAINE HCL 1 % IJ SOLN
INTRAMUSCULAR | Status: AC
  Filled 2022-02-21: qty 20

## 2022-02-21 MED ORDER — VERAPAMIL HCL 2.5 MG/ML IV SOLN
INTRAVENOUS | Status: DC | PRN
  Administered 2022-02-21: 2.5 mg via INTRAVENOUS

## 2022-02-21 MED ORDER — SODIUM CHLORIDE 0.9 % IV SOLN: 250.0000 mL | INTRAVENOUS | Status: DC | PRN

## 2022-02-21 MED ORDER — HEPARIN (PORCINE) IN NACL 1000-0.9 UT/500ML-% IV SOLN
INTRAVENOUS | Status: AC
  Filled 2022-02-21: qty 1000

## 2022-02-21 MED ORDER — HEPARIN SODIUM (PORCINE) 1000 UNIT/ML IJ SOLN
INTRAMUSCULAR | Status: DC | PRN
  Administered 2022-02-21: 5000 [IU] via INTRAVENOUS

## 2022-02-21 MED ORDER — OXYCODONE HCL 5 MG PO TABS
5.0000 mg | ORAL_TABLET | Freq: Two times a day (BID) | ORAL | Status: DC | PRN
  Administered 2022-02-21 – 2022-02-26 (×7): 5 mg via ORAL
  Filled 2022-02-21 (×7): qty 1

## 2022-02-21 MED ORDER — IOHEXOL 300 MG/ML  SOLN
INTRAMUSCULAR | Status: DC | PRN
  Administered 2022-02-21: 44 mL

## 2022-02-21 MED ORDER — METOLAZONE 2.5 MG PO TABS
2.5000 mg | ORAL_TABLET | Freq: Every day | ORAL | Status: DC
  Administered 2022-02-21 – 2022-02-23 (×3): 2.5 mg via ORAL
  Filled 2022-02-21 (×4): qty 1

## 2022-02-21 MED ORDER — MIDAZOLAM HCL 2 MG/2ML IJ SOLN
INTRAMUSCULAR | Status: DC | PRN
  Administered 2022-02-21 (×2): 1 mg via INTRAVENOUS

## 2022-02-21 MED ORDER — VERAPAMIL HCL 2.5 MG/ML IV SOLN
INTRAVENOUS | Status: AC
  Filled 2022-02-21: qty 2

## 2022-02-21 MED ORDER — SODIUM CHLORIDE 0.9% FLUSH: 3.0000 mL | INTRAVENOUS | Status: DC | PRN

## 2022-02-21 MED ORDER — SODIUM CHLORIDE 0.9% FLUSH
3.0000 mL | Freq: Two times a day (BID) | INTRAVENOUS | Status: DC
  Administered 2022-02-21 – 2022-03-01 (×16): 3 mL via INTRAVENOUS

## 2022-02-21 MED ORDER — HEPARIN SODIUM (PORCINE) 1000 UNIT/ML IJ SOLN
INTRAMUSCULAR | Status: AC
  Filled 2022-02-21: qty 10

## 2022-02-21 MED ORDER — MIDAZOLAM HCL 2 MG/2ML IJ SOLN
INTRAMUSCULAR | Status: AC
  Filled 2022-02-21: qty 2

## 2022-02-21 MED ORDER — FENTANYL CITRATE (PF) 100 MCG/2ML IJ SOLN
INTRAMUSCULAR | Status: AC
  Filled 2022-02-21: qty 2

## 2022-02-21 SURGICAL SUPPLY — 14 items
BAND CMPR LRG ZPHR (HEMOSTASIS) ×1
BAND ZEPHYR COMPRESS 30 LONG (HEMOSTASIS) ×1 IMPLANT
CATH 5FR JL3.5 JR4 ANG PIG MP (CATHETERS) ×1 IMPLANT
CATH BALLN WEDGE 5F 110CM (CATHETERS) ×1 IMPLANT
DRAPE BRACHIAL (DRAPES) ×2 IMPLANT
GLIDESHEATH SLEND SS 6F .021 (SHEATH) ×1 IMPLANT
GUIDEWIRE INQWIRE 1.5J.035X260 (WIRE) IMPLANT
INQWIRE 1.5J .035X260CM (WIRE) ×2 IMPLANT
KIT SYRINGE INJ CVI SPIKEX1 (MISCELLANEOUS) ×1 IMPLANT
PACK CARDIAC CATH (CUSTOM PROCEDURE TRAY) ×2 IMPLANT
PROTECTION STATION PRESSURIZED (MISCELLANEOUS) ×2 IMPLANT
SET ATX SIMPLICITY (MISCELLANEOUS) ×1 IMPLANT
SHEATH GLIDE SLENDER 4/5FR (SHEATH) ×1 IMPLANT
STATION PROTECTION PRESSURIZED (MISCELLANEOUS) IMPLANT

## 2022-02-21 NOTE — Progress Notes (Signed)
   Cardiac MRI ordered for the morning of 02/23/2022 per Dr. Garen Lah.

## 2022-02-21 NOTE — Progress Notes (Signed)
PROGRESS NOTE    Lawrence Murray  ENI:[id number] DOB: 1965/10/30  DOA: 02/05/2022 Date of Service: 02/21/22 PCP: Merryl Hacker, No     Brief Narrative / Hospital Course:  56 y.o. male who has not seen a doctor in many years, presented 02/05/22 to the hospital because of shortness of breath and significant swelling in the lower extremities. He was admitted to the hospital for acute on chronic systolic and diastolic CHF with anasarca.  2D echo showed estimated EF of 30 to 61% but LV diastolic function could not be evaluated.  He was treated with IV Lasix.  BP was soft so midodrine was started to allow for adequate diuresis.  Troponin was elevated but this was attributed to demand ischemia.  Cardiologist recommended left and right heart cath once he is euvolemic. There was concern for peripheral arterial disease.  Patient underwent angiogram with vascular surgery on 6/28.  Good arterial flow noted.  No intervention was indicated.  Volume status is markedly improved.  As of 6/30 patient is 25.5 L net negative.  Symptomatically improved. 7/3, patient tolerating diuretics well.  Focused on shooting pain in bilateral lower extremities. 7/4: Leg pain improved.  Seen by cardiology this morning.  Patient will be set up for left and right heart cath and possible PCI 7/5 7/5: L/R Cardiac Cath (+)severe three-vessel CAD appears chronic. Left and right heart pressures high (LVEDP 32 mmHg). Cardio recommend few more days aggressive diuresis, added metolazone to his scheduled Lasix. Consider MRI viability study to help guide revascularization options.  Consultants:  Cardiology   Procedures: 02/21/22 Left and Right Heart Catheterization w/ Dr End     Subjective: Patient reports unhappy w/ his carb restricted diet. Reports intermittent pain/tingling in lower extremities. Reports frustration w/ illness.      ASSESSMENT & PLAN:   Principal Problem:   Acute on chronic combined systolic and diastolic CHF  (congestive heart failure) (HCC) Active Problems:   Smoker   Type 2 diabetes mellitus with hyperglycemia (HCC)   PVD (peripheral vascular disease) (HCC)   Acute HFrEF (heart failure with reduced ejection fraction) (HCC)   Coronary artery disease   Coronary artery disease Per Dr End (cardio): Cath showed severe three-vessel CAD that appears chronic. Left and right heart pressures high (LVEDP 32 mmHg). Recommend few more days aggressive diuresis, added metolazone to his scheduled Lasix. Planning MRI viability study to help guide revascularization options.  Acute on chronic systolic and diastolic congestive heart failure Anasarca 2D echocardiogram EF 30 to 44% Diastolic dysfunction indeterminate, suspect some element of diastolic dysfunction Net IO Since Admission: -34,755 mL [02/21/22 1535] Plan: Continue Lasix 80 mg IV twice daily Continue Daily aldactone Daily renal function, creatinine remained stable Continue midodrine 2.5 3 times daily Unable to add GDMT at this time d/t low BP - cardiology following    Hypotension BP remains soft Continue midodrine 2.5 3 times daily Hold GDMT   Elevated troponin Likely secondary demand ischemia No indication of ACS   Bilateral pleural effusions Likely secondary to decompensated heart failure Patient on room air, clear lungs   Type 2 diabetes mellitus Hemoglobin A1c 7.7 SSI with NovoLog Continue empagliflozin Consider metformin at discharge   Peripheral vascular disease Peripheral neuropathy Lower extremity angiogram reassuring Gabapentin not effective Seems to have been a result of Lyrica Plan: Continue Lyrica 100 3 times daily Continue Robaxin Avoid narcotics except breakthrough    Anxiety Seroquel as needed as needed Xanax   Tobacco use disorder Counseled patient    DVT  prophylaxis: lovenox Code Status: FULL Family Communication: pt declined my offer to update family / support person  Disposition Plan / TOC needs:  likely back to home environment but may need HH/SNF pending PT/OT eval  Barriers to discharge / significant pending items: continued IV diuresis needed per cardiology recommendations              Objective: Vitals:   02/21/22 0845 02/21/22 0900 02/21/22 0915 02/21/22 0930  BP: '90/68 94/65 97/67 '$ 92/60  Pulse: 94 98 96 95  Resp: 20 18 (!) 21 (!) 22  Temp:      TempSrc:      SpO2: 96% 96% 96% 94%  Weight:      Height:        Intake/Output Summary (Last 24 hours) at 02/21/2022 0935 Last data filed at 02/21/2022 0500 Gross per 24 hour  Intake 960 ml  Output 2975 ml  Net -2015 ml   Filed Weights   02/18/22 0500 02/20/22 0431 02/21/22 0500  Weight: 105 kg 106.3 kg 106.8 kg    Examination:  Constitutional:  VS as above General Appearance: alert, well-developed, well-nourished, NAD Ears, Nose, Mouth, Throat: Normal appearance Neck: No masses, trachea midline Respiratory: Normal respiratory effort Breath sounds normal, no wheeze/rhonchi/rales Cardiovascular: S1/S2 normal, no murmur/rub/gallop auscultated Unable to assess lower extremity edema - both legs bandaged/wrapped  Gastrointestinal: Nontender, no masses Musculoskeletal:  No clubbing/cyanosis of digits Neurological: No cranial nerve deficit on limited exam Motor and sensation intact and symmetric Psychiatric: Normal judgment/insight Normal mood and affect       Scheduled Medications:   [MAR Hold] aspirin EC  81 mg Oral Daily   [MAR Hold] digoxin  0.125 mg Oral Daily   [MAR Hold] empagliflozin  10 mg Oral Daily   [MAR Hold] enoxaparin (LOVENOX) injection  0.5 mg/kg Subcutaneous Q24H   [MAR Hold] furosemide  80 mg Intravenous BID   [MAR Hold] insulin aspart  0-15 Units Subcutaneous TID WC   [MAR Hold] insulin aspart  0-5 Units Subcutaneous QHS   [MAR Hold] insulin aspart  3 Units Subcutaneous TID WC   [MAR Hold] methocarbamol  750 mg Oral QID   [MAR Hold] midodrine  2.5 mg Oral BID WC   [MAR Hold]  pantoprazole  40 mg Oral Daily   [MAR Hold] pregabalin  100 mg Oral TID   [MAR Hold] QUEtiapine  25 mg Oral QHS   sodium chloride flush  3 mL Intravenous Q12H   [MAR Hold] spironolactone  12.5 mg Oral Daily    Continuous Infusions:  sodium chloride     sodium chloride 10 mL/hr at 02/21/22 0544    PRN Medications:  sodium chloride, [MAR Hold] acetaminophen, [MAR Hold] ALPRAZolam, fentaNYL, Heparin (Porcine) in NaCl, heparin sodium (porcine), iohexol, lidocaine (PF), midazolam, [MAR Hold] ondansetron (ZOFRAN) IV, [MAR Hold] ondansetron (ZOFRAN) IV, sodium chloride flush, verapamil  Antimicrobials:  Anti-infectives (From admission, onward)    Start     Dose/Rate Route Frequency Ordered Stop   02/14/22 1156  ceFAZolin (ANCEF) IVPB 1 g/50 mL premix        over 30 Minutes  Continuous PRN 02/14/22 1200 02/14/22 1328   02/14/22 1018  ceFAZolin (ANCEF) IVPB 2g/100 mL premix  Status:  Discontinued        2 g 200 mL/hr over 30 Minutes Intravenous 30 min pre-op 02/14/22 1018 02/14/22 1328   02/13/22 1816  ceFAZolin (ANCEF) IVPB 2g/100 mL premix  Status:  Discontinued        2 g  200 mL/hr over 30 Minutes Intravenous 30 min pre-op 02/13/22 1816 02/14/22 1023       Data Reviewed: I have personally reviewed following labs and imaging studies  CBC: Recent Labs  Lab 02/16/22 0619  WBC 8.1  HGB 13.4  HCT 41.1  MCV 95.4  PLT 707   Basic Metabolic Panel: Recent Labs  Lab 02/17/22 0632 02/18/22 0809 02/19/22 0458 02/20/22 0603 02/21/22 0636  NA 136 136 138 135 135  K 4.0 3.8 4.0 4.3 4.1  CL 93* 94* 95* 95* 93*  CO2 32 32 31 31 34*  GLUCOSE 150* 150* 210* 186* 176*  BUN 29* 31* 31* 33* 32*  CREATININE 1.07 0.99 1.09 1.04 1.04  CALCIUM 9.0 9.1 9.1 9.1 9.1   GFR: Estimated Creatinine Clearance: 99.3 mL/min (by C-G formula based on SCr of 1.04 mg/dL). Liver Function Tests: No results for input(s): "AST", "ALT", "ALKPHOS", "BILITOT", "PROT", "ALBUMIN" in the last 168 hours. No  results for input(s): "LIPASE", "AMYLASE" in the last 168 hours. No results for input(s): "AMMONIA" in the last 168 hours. Coagulation Profile: No results for input(s): "INR", "PROTIME" in the last 168 hours. Cardiac Enzymes: No results for input(s): "CKTOTAL", "CKMB", "CKMBINDEX", "TROPONINI" in the last 168 hours. BNP (last 3 results) No results for input(s): "PROBNP" in the last 8760 hours. HbA1C: No results for input(s): "HGBA1C" in the last 72 hours. CBG: Recent Labs  Lab 02/20/22 1207 02/20/22 1620 02/20/22 2121 02/21/22 0730 02/21/22 0845  GLUCAP 245* 166* 194* 178* 174*   Lipid Profile: No results for input(s): "CHOL", "HDL", "LDLCALC", "TRIG", "CHOLHDL", "LDLDIRECT" in the last 72 hours. Thyroid Function Tests: No results for input(s): "TSH", "T4TOTAL", "FREET4", "T3FREE", "THYROIDAB" in the last 72 hours. Anemia Panel: No results for input(s): "VITAMINB12", "FOLATE", "FERRITIN", "TIBC", "IRON", "RETICCTPCT" in the last 72 hours. Urine analysis: No results found for: "COLORURINE", "APPEARANCEUR", "LABSPEC", "PHURINE", "GLUCOSEU", "HGBUR", "BILIRUBINUR", "KETONESUR", "PROTEINUR", "UROBILINOGEN", "NITRITE", "LEUKOCYTESUR" Sepsis Labs: '@LABRCNTIP'$ (procalcitonin:4,lacticidven:4)  Recent Results (from the past 240 hour(s))  Aerobic Culture w Gram Stain (superficial specimen)     Status: Abnormal   Collection Time: 02/15/22  2:20 PM   Specimen: Wound  Result Value Ref Range Status   Specimen Description   Final    WOUND Performed at [facility], [address] [place], [place] [address]    Special Requests   Final    HS STER Performed at [facility], [address] [place], [place] [address]    Gram Stain   Final    NO SQUAMOUS EPITHELIAL CELLS SEEN NO WBC SEEN ABUNDANT GRAM NEGATIVE RODS ABUNDANT GRAM POSITIVE COCCI    Culture (A)  Final    MULTIPLE ORGANISMS PRESENT, NONE PREDOMINANT NO STAPHYLOCOCCUS AUREUS ISOLATED NO GROUP A STREP  (S.PYOGENES) ISOLATED Performed at [facility], [place] [address], [place], [place] [address]    Report Status 02/18/2022 FINAL  Final         Radiology Studies last 96 hours: CARDIAC CATHETERIZATION  Result Date: 02/21/2022 Conclusions: Severe three-vessel coronary artery disease, including chronic total/subtotal occlusions of large D1 and OM1 branches, mid LAD, and ostial rPDA.  There is also moderate-severe disease involving the proximal and distal LCx as well as the distal RCA. Moderately elevated left heart, right heart, and pulmonary artery pressures (LVEDP 32 mmHg, PCWP 30 mmHg, mean RA 9 mmHg, RVEDP 14 mmHg, and mean PAP 37 mmHg). Low normal Fick cardiac output/index (CO 5.7 L/min, CI 2.5 L/min/m^2). Recommendations: Escalate diuresis, as filling pressures  indicate persistent volume overload.  I will add metolazone 2.5 mg daily to current regimen of furosemide 80 mg IV twice daily. Escalate goal-directed medical therapy for acute HFrEF due to ischemic cardiomyopathy as blood pressure and renal function allow. Once optimized from a volume standpoint, recommend cardiac MRI to assess for viability to help guide revascularization strategy.  If LAD +/- LCx territory is viable, evaluation cardiac surgery consultation will need to be considered. Nelva Bush, MD [facility]           LOS: 16 days     Emeterio Reeve, DO [facility] 02/21/2022, 9:35 AM   Staff may message me via secure chat in [place]  but this may not receive immediate response,  please page for urgent matters!  If 7PM-7AM, please contact night-coverage [email or web address]  Dictation software was used to generate the above note. Typos may occur and escape review, as with typed/written notes. Please contact Dr Sheppard Coil directly for clarity if needed.

## 2022-02-21 NOTE — Interval H&P Note (Signed)
History and Physical Interval Note:  02/21/2022 7:18 AM  Lawrence Murray  has presented today for surgery, with the diagnosis of acute HFrEF.  The various methods of treatment have been discussed with the patient and family. After consideration of risks, benefits and other options for treatment, the patient has consented to  Procedure(s): RIGHT/LEFT HEART CATH AND CORONARY ANGIOGRAPHY (N/A) as a surgical intervention.  The patient's history has been reviewed, patient examined, no change in status, stable for surgery.  I have reviewed the patient's chart and labs.  Questions were answered to the patient's satisfaction.    Cath Lab Visit (complete for each Cath Lab visit)  Clinical Evaluation Leading to the Procedure:   ACS: No.  Non-ACS:    Anginal/Heart Failure Classification: NYHA class IV  Anti-ischemic medical therapy: No Therapy  Non-Invasive Test Results: No non-invasive testing performed (LVEF 30-35% by echo -> high risk)  Prior CABG: No previous CABG  [name]

## 2022-02-21 NOTE — Progress Notes (Signed)
Progress Note  Patient Name: Lawrence Murray Date of Encounter: 02/21/2022  [facility] Cardiologist: Kate Sable, MD   Subjective   Had right and left heart catheter earlier today, multivessel disease noted.  Right filling pressures elevated, metolazone added to Lasix for additional diuresing.  Inpatient Medications    Scheduled Meds:  aspirin EC  81 mg Oral Daily   digoxin  0.125 mg Oral Daily   empagliflozin  10 mg Oral Daily   enoxaparin (LOVENOX) injection  0.5 mg/kg Subcutaneous Q24H   furosemide  80 mg Intravenous BID   insulin aspart  0-15 Units Subcutaneous TID WC   insulin aspart  0-5 Units Subcutaneous QHS   insulin aspart  3 Units Subcutaneous TID WC   methocarbamol  750 mg Oral QID   metolazone  2.5 mg Oral Daily   midodrine  2.5 mg Oral BID WC   pantoprazole  40 mg Oral Daily   pregabalin  100 mg Oral TID   QUEtiapine  25 mg Oral QHS   sodium chloride flush  3 mL Intravenous Q12H   spironolactone  12.5 mg Oral Daily   Continuous Infusions:  sodium chloride      PRN Meds: sodium chloride, acetaminophen, ALPRAZolam, ondansetron (ZOFRAN) IV, ondansetron (ZOFRAN) IV, oxyCODONE, sodium chloride flush   Vital Signs    Vitals:   02/21/22 0945 02/21/22 1000 02/21/22 1015 02/21/22 1050  BP:  (!) 93/58 98/62 102/67  Pulse: 94 94 96 98  Resp: 18 (!) 22 (!) 22 16  Temp:    98.1 F (36.7 C)  TempSrc:    Oral  SpO2: 96% 96% 94% 96%  Weight:      Height:        Intake/Output Summary (Last 24 hours) at 02/21/2022 1919 Last data filed at 02/21/2022 1837 Gross per 24 hour  Intake 360 ml  Output 2600 ml  Net -2240 ml      02/21/2022    5:00 AM 02/20/2022    4:31 AM 02/18/2022    5:00 AM  Last 3 Weights  Weight (lbs) 235 lb 7.2 oz 234 lb 6.4 oz 231 lb 7.7 oz  Weight (kg) 106.8 kg 106.323 kg 105 kg      Telemetry    Sinus rhythm- Personally Reviewed  ECG     - Personally Reviewed  Physical Exam   GEN: No acute distress.   Neck: No JVD Cardiac:  RRR, no murmurs, rubs, or gallops.  Respiratory: Diminished breath sounds bilaterally GI: Soft, nontender, non-distended  MS: 1-2+ edema; No deformity. Neuro:  Nonfocal  Psych: Normal affect   Labs    High Sensitivity Troponin:   Recent Labs  Lab 02/05/22 0218 02/05/22 0403  TROPONINIHS 370* 367*     Chemistry Recent Labs  Lab 02/19/22 0458 02/20/22 0603 02/21/22 0636  NA 138 135 135  K 4.0 4.3 4.1  CL 95* 95* 93*  CO2 31 31 34*  GLUCOSE 210* 186* 176*  BUN 31* 33* 32*  CREATININE 1.09 1.04 1.04  CALCIUM 9.1 9.1 9.1  GFRNONAA >60 >60 >60  ANIONGAP '12 9 8    '$ Lipids No results for input(s): "CHOL", "TRIG", "HDL", "LABVLDL", "LDLCALC", "CHOLHDL" in the last 168 hours.  Hematology Recent Labs  Lab 02/16/22 0619  WBC 8.1  RBC 4.31  HGB 13.4  HCT 41.1  MCV 95.4  MCH 31.1  MCHC 32.6  RDW 15.6*  PLT 303   Thyroid No results for input(s): "TSH", "FREET4" in the last 168 hours.  BNPNo results for input(s): "BNP", "PROBNP" in the last 168 hours.  DDimer No results for input(s): "DDIMER" in the last 168 hours.   Radiology    CARDIAC CATHETERIZATION  Result Date: 02-22-2022 Conclusions: Severe three-vessel coronary artery disease, including chronic total/subtotal occlusions of large D1 and OM1 branches, mid LAD, and ostial rPDA.  There is also moderate-severe disease involving the proximal and distal LCx as well as the distal RCA. Moderately elevated left heart, right heart, and pulmonary artery pressures (LVEDP 32 mmHg, PCWP 30 mmHg, mean RA 9 mmHg, RVEDP 14 mmHg, and mean PAP 37 mmHg). Low normal Fick cardiac output/index (CO 5.7 L/min, CI 2.5 L/min/m^2). Recommendations: Escalate diuresis, as filling pressures indicate persistent volume overload.  I will add metolazone 2.5 mg daily to current regimen of furosemide 80 mg IV twice daily. Escalate goal-directed medical therapy for acute HFrEF due to ischemic cardiomyopathy as blood pressure and renal function allow. Once  optimized from a volume standpoint, recommend cardiac MRI to assess for viability to help guide revascularization strategy.  If LAD +/- LCx territory is viable, evaluation cardiac surgery consultation will need to be considered. Nelva Bush, MD [facility]   Cardiac Studies   Echo EF 30 to 35%  LHC 02/22/2022 Conclusions: Severe three-vessel coronary artery disease, including chronic total/subtotal occlusions of large D1 and OM1 branches, mid LAD, and ostial rPDA.  There is also moderate-severe disease involving the proximal and distal LCx as well as the distal RCA. Moderately elevated left heart, right heart, and pulmonary artery pressures (LVEDP 32 mmHg, PCWP 30 mmHg, mean RA 9 mmHg, RVEDP 14 mmHg, and mean PAP 37 mmHg). Low normal Fick cardiac output/index (CO 5.7 L/min, CI 2.5 L/min/m^2).   Recommendations: Escalate diuresis, as filling pressures indicate persistent volume overload.  I will add metolazone 2.5 mg daily to current regimen of furosemide 80 mg IV twice daily. Escalate goal-directed medical therapy for acute HFrEF due to ischemic cardiomyopathy as blood pressure and renal function allow. Once optimized from a volume standpoint, recommend cardiac MRI to assess for viability to help guide revascularization strategy.  If LAD +/- LCx territory is viable, evaluation cardiac surgery consultation will need to be considered.    Patient Profile     56 y.o. male history of multivessel CAD, HFrEF, obesity, smoker x20+ years presenting with shortness of breath and edema, being seen for acute HFrEF and multivessel CAD  Assessment & Plan    Ischemic cardiomyopathy, EF 30 to 35% -Still volume overload -IV Lasix 80 mg twice daily, metolazone 2.5 mg daily -Midodrine 2.5 mg 3 times daily, Jardiance 10 mg daily -Continue digoxin, Jardiance Aldactone 12.'5mg'$  qd -Low blood pressures preventing additional use of GDMT  2.  Multivessel CAD (CTO of mLAD, D1, rPDA) -Diurese today and  tomorrow -Get CMR Friday for viability evaluation  3.  Tobacco use -Cessation recommended   Total encounter time more than 50 minutes  Greater than 50% was spent in counseling and coordination of care with the patient     Signed, Kate Sable, MD  2022/02/22, 7:19 PM

## 2022-02-21 NOTE — Progress Notes (Signed)
Changed dressing bilateral legs.  Patient tolerated well.

## 2022-02-21 NOTE — Assessment & Plan Note (Addendum)
Per Dr End (cardio): Cath showed severe three-vessel CAD that appears chronic. Left and right heart pressures high (LVEDP 32 mmHg). Recommend few more days aggressive diuresis, added metolazone to his scheduled Lasix. Consider MRI viability study to help guide revascularization options.

## 2022-02-22 ENCOUNTER — Encounter: Payer: Self-pay | Admitting: Internal Medicine

## 2022-02-22 DIAGNOSIS — I4892 Unspecified atrial flutter: Secondary | ICD-10-CM | POA: Diagnosis not present

## 2022-02-22 DIAGNOSIS — I25118 Atherosclerotic heart disease of native coronary artery with other forms of angina pectoris: Secondary | ICD-10-CM

## 2022-02-22 DIAGNOSIS — I5043 Acute on chronic combined systolic (congestive) and diastolic (congestive) heart failure: Secondary | ICD-10-CM | POA: Diagnosis not present

## 2022-02-22 DIAGNOSIS — R739 Hyperglycemia, unspecified: Secondary | ICD-10-CM | POA: Diagnosis not present

## 2022-02-22 DIAGNOSIS — E1165 Type 2 diabetes mellitus with hyperglycemia: Secondary | ICD-10-CM | POA: Diagnosis not present

## 2022-02-22 DIAGNOSIS — I2510 Atherosclerotic heart disease of native coronary artery without angina pectoris: Secondary | ICD-10-CM | POA: Diagnosis not present

## 2022-02-22 DIAGNOSIS — I739 Peripheral vascular disease, unspecified: Secondary | ICD-10-CM | POA: Diagnosis not present

## 2022-02-22 DIAGNOSIS — I420 Dilated cardiomyopathy: Secondary | ICD-10-CM | POA: Diagnosis not present

## 2022-02-22 DIAGNOSIS — I5021 Acute systolic (congestive) heart failure: Secondary | ICD-10-CM | POA: Diagnosis not present

## 2022-02-22 DIAGNOSIS — I248 Other forms of acute ischemic heart disease: Secondary | ICD-10-CM | POA: Diagnosis not present

## 2022-02-22 LAB — CBC
HCT: 40.5 % (ref 39.0–52.0)
Hemoglobin: 13.1 g/dL (ref 13.0–17.0)
MCH: 30.7 pg (ref 26.0–34.0)
MCHC: 32.3 g/dL (ref 30.0–36.0)
MCV: 94.8 fL (ref 80.0–100.0)
Platelets: 367 10*3/uL (ref 150–400)
RBC: 4.27 MIL/uL (ref 4.22–5.81)
RDW: 15.9 % — ABNORMAL HIGH (ref 11.5–15.5)
WBC: 7.3 10*3/uL (ref 4.0–10.5)
nRBC: 0 % (ref 0.0–0.2)

## 2022-02-22 LAB — GLUCOSE, CAPILLARY
Glucose-Capillary: 172 mg/dL — ABNORMAL HIGH (ref 70–99)
Glucose-Capillary: 171 mg/dL — ABNORMAL HIGH (ref 70–99)
Glucose-Capillary: 153 mg/dL — ABNORMAL HIGH (ref 70–99)
Glucose-Capillary: 299 mg/dL — ABNORMAL HIGH (ref 70–99)

## 2022-02-22 LAB — BASIC METABOLIC PANEL
Anion gap: 9 (ref 5–15)
BUN: 36 mg/dL — ABNORMAL HIGH (ref 6–20)
CO2: 32 mmol/L (ref 22–32)
Calcium: 9.1 mg/dL (ref 8.9–10.3)
Chloride: 92 mmol/L — ABNORMAL LOW (ref 98–111)
Creatinine, Ser: 1.23 mg/dL (ref 0.61–1.24)
GFR, Estimated: >60 mL/min (ref >60)
Glucose, Bld: 186 mg/dL — ABNORMAL HIGH (ref 70–99)
Potassium: 4.2 mmol/L (ref 3.5–5.1)
Sodium: 133 mmol/L — ABNORMAL LOW (ref 135–145)

## 2022-02-22 MED ORDER — BISACODYL 5 MG PO TBEC
10.0000 mg | DELAYED_RELEASE_TABLET | Freq: Every day | ORAL | Status: DC | PRN
  Administered 2022-02-23 – 2022-02-25 (×2): 10 mg via ORAL
  Filled 2022-02-22 (×4): qty 2

## 2022-02-22 MED ORDER — ALPRAZOLAM 0.5 MG PO TABS
0.5000 mg | ORAL_TABLET | Freq: Once | ORAL | Status: AC
  Administered 2022-02-22: 0.5 mg via ORAL
  Filled 2022-02-22: qty 1

## 2022-02-22 NOTE — Progress Notes (Signed)
PROGRESS NOTE    Lawrence Murray  SNK:[id number] DOB: 1966/06/23  DOA: 02/05/2022 Date of Service: 02/22/22 PCP: Merryl Hacker, No     Brief Narrative / Hospital Course:  56 y.o. male who has not seen a doctor in many years, presented 02/05/22 to the hospital because of shortness of breath and significant swelling in the lower extremities. He was admitted to the hospital for acute on chronic systolic and diastolic CHF with anasarca.  2D echo showed estimated EF of 30 to 93% but LV diastolic function could not be evaluated.  He was treated with IV Lasix.  BP was soft so midodrine was started to allow for adequate diuresis.  Troponin was elevated but this was attributed to demand ischemia.  Cardiologist recommended left and right heart cath once he is euvolemic. There was concern for peripheral arterial disease.  Patient underwent angiogram with vascular surgery on 6/28.  Good arterial flow noted.  No intervention was indicated.  Volume status is markedly improved.  As of 6/30 patient is 25.5 L net negative.  Symptomatically improved. 7/3, patient tolerating diuretics well.  Focused on shooting pain in bilateral lower extremities. 7/4: Leg pain improved.  Seen by cardiology this morning.  Patient will be set up for left and right heart cath and possible PCI 7/5 7/5: L/R Cardiac Cath (+)severe three-vessel CAD appears chronic. Left and right heart pressures high (LVEDP 32 mmHg). Cardio recommend few more days aggressive diuresis, added metolazone to his scheduled Lasix. Planning MRI viability study to help guide revascularization options - scheduled/planned for 07/07. 7/6: planning MR cardiac morphology study tomorrow. Net IO Since Admission: -34,917 mL [02/22/22 0915].    Consultants:  Cardiology   Procedures: 02/21/22 Left and Right Heart Catheterization w/ Dr End     Subjective: Patient reports unhappy w/ his carb restricted diet - want to eat fruits. Reports intermittent pain/tingling in lower  extremities persists but not too abd.      ASSESSMENT & PLAN:   Principal Problem:   Acute on chronic combined systolic and diastolic CHF (congestive heart failure) (HCC) Active Problems:   Smoker   Type 2 diabetes mellitus with hyperglycemia (HCC)   PVD (peripheral vascular disease) (HCC)   Acute HFrEF (heart failure with reduced ejection fraction) (HCC)   Coronary artery disease   Coronary artery disease Per Dr End (cardio) 07/05: Cath showed severe three-vessel CAD that appears chronic. Left and right heart pressures high (LVEDP 32 mmHg). Recommend few more days aggressive diuresis, added metolazone to his scheduled Lasix. Planning MRI viability study for tomorrow 07/07 to help guide revascularization options.  Acute on chronic systolic and diastolic congestive heart failure Anasarca 2D echocardiogram EF 30 to 79% Diastolic dysfunction indeterminate, suspect some element of diastolic dysfunction Net IO Since Admission: -36,027 mL [02/22/22 1751] Plan: Continue Lasix 80 mg IV twice daily  Continue Daily aldactone Daily renal function, creatinine has remained stable Continue midodrine 2.5 mg 3 times daily Unable to add GDMT at this time d/t low BP  cardiology following    Hypotension BP remains soft Continue midodrine 2.5 3 times daily Hold GDMT   Elevated troponin Likely secondary demand ischemia No indication of ACS   Bilateral pleural effusions Likely secondary to decompensated heart failure Patient on room air, clear lungs   Type 2 diabetes mellitus Hemoglobin A1c 7.7 SSI with NovoLog Continue empagliflozin Consider metformin at discharge   Peripheral vascular disease Peripheral neuropathy Lower extremity angiogram reassuring Gabapentin not effective Seems to have been a result of Lyrica  Plan: Continue Lyrica 100 3 times daily Continue Robaxin Avoid narcotics except breakthrough    Anxiety Seroquel as needed as needed Xanax   Tobacco use  disorder Counseled patient    DVT prophylaxis: lovenox Code Status: FULL Family Communication: pt declined my offer to update family / support person  Disposition Plan / TOC needs: likely back to home environment but may need HH/SNF pending PT/OT eval when medically improved  Barriers to discharge / significant pending items: continued IV diuresis and further cardiac workup pending per cardiology recommendations              Objective: Vitals:   02/22/22 0022 02/22/22 0247 02/22/22 0446 02/22/22 0728  BP:   96/65 98/71  Pulse:   87 88  Resp:   20 17  Temp:   (!) 97.5 F (36.4 C)   TempSrc:   Oral   SpO2:   97% 98%  Weight: 105.6 kg 105.6 kg    Height:        Intake/Output Summary (Last 24 hours) at 02/22/2022 0914 Last data filed at 02/22/2022 0100 Gross per 24 hour  Intake 838 ml  Output 1750 ml  Net -912 ml    Filed Weights   02/21/22 0500 02/22/22 0022 02/22/22 0247  Weight: 106.8 kg 105.6 kg 105.6 kg    Examination:  Constitutional:  VS as above General Appearance: alert, well-developed, well-nourished, NAD Ears, Nose, Mouth, Throat: Normal appearance Neck: No masses, trachea midline Respiratory: Normal respiratory effort Breath sounds normal, no wheeze/rhonchi/rales Cardiovascular: S1/S2 normal, no murmur/rub/gallop auscultated Unable to assess lower extremity edema - both legs bandaged/wrapped  Gastrointestinal: Nontender, no masses Musculoskeletal:  No clubbing/cyanosis of digits Neurological: No cranial nerve deficit on limited exam Motor and sensation intact and symmetric Psychiatric: Normal judgment/insight Normal mood and affect       Scheduled Medications:   aspirin EC  81 mg Oral Daily   digoxin  0.125 mg Oral Daily   empagliflozin  10 mg Oral Daily   enoxaparin (LOVENOX) injection  0.5 mg/kg Subcutaneous Q24H   furosemide  80 mg Intravenous BID   insulin aspart  0-15 Units Subcutaneous TID WC   insulin aspart  0-5 Units  Subcutaneous QHS   insulin aspart  3 Units Subcutaneous TID WC   methocarbamol  750 mg Oral QID   metolazone  2.5 mg Oral Daily   midodrine  2.5 mg Oral BID WC   pantoprazole  40 mg Oral Daily   pregabalin  100 mg Oral TID   QUEtiapine  25 mg Oral QHS   sodium chloride flush  3 mL Intravenous Q12H   spironolactone  12.5 mg Oral Daily    Continuous Infusions:  sodium chloride      PRN Medications:  sodium chloride, acetaminophen, ALPRAZolam, ondansetron (ZOFRAN) IV, ondansetron (ZOFRAN) IV, oxyCODONE, sodium chloride flush  Antimicrobials:  Anti-infectives (From admission, onward)    Start     Dose/Rate Route Frequency Ordered Stop   02/14/22 1156  ceFAZolin (ANCEF) IVPB 1 g/50 mL premix        over 30 Minutes  Continuous PRN 02/14/22 1200 02/14/22 1328   02/14/22 1018  ceFAZolin (ANCEF) IVPB 2g/100 mL premix  Status:  Discontinued        2 g 200 mL/hr over 30 Minutes Intravenous 30 min pre-op 02/14/22 1018 02/14/22 1328   02/13/22 1816  ceFAZolin (ANCEF) IVPB 2g/100 mL premix  Status:  Discontinued        2 g 200 mL/hr over  30 Minutes Intravenous 30 min pre-op 02/13/22 1816 02/14/22 1023       Data Reviewed: I have personally reviewed following labs and imaging studies  CBC: Recent Labs  Lab 02/16/22 0619 02/22/22 0459  WBC 8.1 7.3  HGB 13.4 13.1  HCT 41.1 40.5  MCV 95.4 94.8  PLT 303 144    Basic Metabolic Panel: Recent Labs  Lab 02/18/22 0809 02/19/22 0458 02/20/22 0603 02/21/22 0636 02/22/22 0459  NA 136 138 135 135 133*  K 3.8 4.0 4.3 4.1 4.2  CL 94* 95* 95* 93* 92*  CO2 32 31 31 34* 32  GLUCOSE 150* 210* 186* 176* 186*  BUN 31* 31* 33* 32* 36*  CREATININE 0.99 1.09 1.04 1.04 1.23  CALCIUM 9.1 9.1 9.1 9.1 9.1    GFR: Estimated Creatinine Clearance: 83.5 mL/min (by C-G formula based on SCr of 1.23 mg/dL). Liver Function Tests: No results for input(s): "AST", "ALT", "ALKPHOS", "BILITOT", "PROT", "ALBUMIN" in the last 168 hours. No results for  input(s): "LIPASE", "AMYLASE" in the last 168 hours. No results for input(s): "AMMONIA" in the last 168 hours. Coagulation Profile: No results for input(s): "INR", "PROTIME" in the last 168 hours. Cardiac Enzymes: No results for input(s): "CKTOTAL", "CKMB", "CKMBINDEX", "TROPONINI" in the last 168 hours. BNP (last 3 results) No results for input(s): "PROBNP" in the last 8760 hours. HbA1C: No results for input(s): "HGBA1C" in the last 72 hours. CBG: Recent Labs  Lab 02/21/22 0845 02/21/22 1051 02/21/22 1646 02/21/22 2110 02/22/22 0729  GLUCAP 174* 199* 183* 300* 172*    Lipid Profile: No results for input(s): "CHOL", "HDL", "LDLCALC", "TRIG", "CHOLHDL", "LDLDIRECT" in the last 72 hours. Thyroid Function Tests: No results for input(s): "TSH", "T4TOTAL", "FREET4", "T3FREE", "THYROIDAB" in the last 72 hours. Anemia Panel: No results for input(s): "VITAMINB12", "FOLATE", "FERRITIN", "TIBC", "IRON", "RETICCTPCT" in the last 72 hours. Urine analysis: No results found for: "COLORURINE", "APPEARANCEUR", "LABSPEC", "PHURINE", "GLUCOSEU", "HGBUR", "BILIRUBINUR", "KETONESUR", "PROTEINUR", "UROBILINOGEN", "NITRITE", "LEUKOCYTESUR" Sepsis Labs: '@LABRCNTIP'$ (procalcitonin:4,lacticidven:4)  Recent Results (from the past 240 hour(s))  Aerobic Culture w Gram Stain (superficial specimen)     Status: Abnormal   Collection Time: 02/15/22  2:20 PM   Specimen: Wound  Result Value Ref Range Status   Specimen Description   Final    WOUND Performed at [facility], [address], [place], [place] [address]    Special Requests   Final    HS STER Performed at [facility], [address] [place], [place] [address]    Gram Stain   Final    NO SQUAMOUS EPITHELIAL CELLS SEEN NO WBC SEEN ABUNDANT GRAM NEGATIVE RODS ABUNDANT GRAM POSITIVE COCCI    Culture (A)  Final    MULTIPLE ORGANISMS PRESENT, NONE PREDOMINANT NO STAPHYLOCOCCUS AUREUS ISOLATED NO GROUP A STREP (S.PYOGENES)  ISOLATED Performed at [facility], [place] [address] [place], [place] [address]    Report Status 02/18/2022 FINAL  Final         Radiology Studies last 96 hours: CARDIAC CATHETERIZATION  Result Date: 02/21/2022 Conclusions: Severe three-vessel coronary artery disease, including chronic total/subtotal occlusions of large D1 and OM1 branches, mid LAD, and ostial rPDA.  There is also moderate-severe disease involving the proximal and distal LCx as well as the distal RCA. Moderately elevated left heart, right heart, and pulmonary artery pressures (LVEDP 32 mmHg, PCWP 30 mmHg, mean RA 9 mmHg, RVEDP 14 mmHg, and mean PAP 37 mmHg). Low normal Fick cardiac output/index (CO 5.7 L/min, CI 2.5  L/min/m^2). Recommendations: Escalate diuresis, as filling pressures indicate persistent volume overload.  I will add metolazone 2.5 mg daily to current regimen of furosemide 80 mg IV twice daily. Escalate goal-directed medical therapy for acute HFrEF due to ischemic cardiomyopathy as blood pressure and renal function allow. Once optimized from a volume standpoint, recommend cardiac MRI to assess for viability to help guide revascularization strategy.  If LAD +/- LCx territory is viable, evaluation cardiac surgery consultation will need to be considered. Nelva Bush, MD [facility]           LOS: 17 days     Emeterio Reeve, DO [facility] 02/22/2022, 9:14 AM   Staff may message me via secure chat in [place]  but this may not receive immediate response,  please page for urgent matters!  If 7PM-7AM, please contact night-coverage [email or web address]  Dictation software was used to generate the above note. Typos may occur and escape review, as with typed/written notes. Please contact Dr Sheppard Coil directly for clarity if needed.

## 2022-02-22 NOTE — Progress Notes (Signed)
Attending Note Patient seen and examined, agree with detailed note above,   Patient presentation and plan discussed on rounds.    EKG lab work, chest x-ray, echocardiogram reviewed independently by myself  Numerous complaints on rounds this morning Complaints concerning his restrictive diet, time spent in the hospital, inconvenience not being able to use his right arm following catheterization yesterday, not receiving his Xanax when he would like it  On examination : alert oriented, unable to estimate JVD, lungs clear to auscultation bilaterally, heart sounds regular normal S1-S2 no murmurs appreciated, abdomen soft nontender 2+ pitting bilateral lower extremity edema.  Musculoskeletal exam with good range of motion, neurologic exam grossly nonfocal  Lab work reviewed BUN 36 creatinine 1.23 sodium 133 Hemoglobin 13  A/P: Ischemic cardiomyopathy, EF 30 to 35% Cardiac catheterization performed yesterday showing multivessel disease, CTO of mid LAD, diagonal, PDA Scheduled for cardiac MRI tomorrow for viability to help guide coronary intervention --Still volume overloaded, would continue -IV Lasix 80 mg twice daily, metolazone 2.5 mg daily -Continue digoxin, Jardiance Aldactone 12.'5mg'$  qd  2.  Multivessel CAD (CTO of mLAD, D1, rPDA) -Get CMR Friday for viability evaluation   3.  Tobacco use -Cessation recommended  Long discussion concerning plan as detailed above Numerous complaints discussed Greater than 50% was spent in counseling and coordination of care with patient Total encounter time 50 minutes or more   Signed: Esmond Plants  M.D., Ph.D. [facility]    Cardiology Progress Note   Patient Name: [name] Date of Encounter: 02/22/2022  Primary Cardiologist: Kate Sable, MD  Subjective   Patient feeling overall better breathing wise. Asking about next steps, states he does not feel like anything is wrong with his heart. Still c/o BLE pain.   Inpatient  Medications    Scheduled Meds:  aspirin EC  81 mg Oral Daily   digoxin  0.125 mg Oral Daily   empagliflozin  10 mg Oral Daily   enoxaparin (LOVENOX) injection  0.5 mg/kg Subcutaneous Q24H   furosemide  80 mg Intravenous BID   insulin aspart  0-15 Units Subcutaneous TID WC   insulin aspart  0-5 Units Subcutaneous QHS   insulin aspart  3 Units Subcutaneous TID WC   methocarbamol  750 mg Oral QID   metolazone  2.5 mg Oral Daily   midodrine  2.5 mg Oral BID WC   pantoprazole  40 mg Oral Daily   pregabalin  100 mg Oral TID   QUEtiapine  25 mg Oral QHS   sodium chloride flush  3 mL Intravenous Q12H   spironolactone  12.5 mg Oral Daily   Continuous Infusions:  sodium chloride     PRN Meds: sodium chloride, acetaminophen, ALPRAZolam, ondansetron (ZOFRAN) IV, ondansetron (ZOFRAN) IV, oxyCODONE, sodium chloride flush   Vital Signs    Vitals:   02/22/22 0022 02/22/22 0247 02/22/22 0446 02/22/22 0728  BP:   96/65 98/71  Pulse:   87 88  Resp:   20 17  Temp:   (!) 97.5 F (36.4 C)   TempSrc:   Oral   SpO2:   97% 98%  Weight: 105.6 kg 105.6 kg    Height:        Intake/Output Summary (Last 24 hours) at 02/22/2022 1138 Last data filed at 02/22/2022 1100 Gross per 24 hour  Intake 1078 ml  Output 2050 ml  Net -972 ml   Filed Weights   02/21/22 0500 02/22/22 0022 02/22/22 0247  Weight: 106.8 kg 105.6 kg 105.6 kg  Physical Exam   GEN: Well nourished, well developed, in no acute distress.  HEENT: Grossly normal.  Neck: Supple, no JVD, carotid bruits, or masses. Cardiac: RRR, no murmurs, rubs, or gallops. No clubbing or cyanosis. +2 edema.  Radials 2+, DP/PT 2+ and equal bilaterally.  Respiratory:  Respirations regular and unlabored, clear to auscultation bilaterally. GI: Soft, nontender, nondistended, BS + x 4. MS: no deformity or atrophy. Skin: warm and dry, no rash. Neuro:  Strength and sensation are intact. Psych: AAOx3.  Normal affect.  Labs    Chemistry Recent Labs   Lab 02/20/22 0603 02/21/22 0636 02/22/22 0459  NA 135 135 133*  K 4.3 4.1 4.2  CL 95* 93* 92*  CO2 31 34* 32  GLUCOSE 186* 176* 186*  BUN 33* 32* 36*  CREATININE 1.04 1.04 1.23  CALCIUM 9.1 9.1 9.1  GFRNONAA >60 >60 >60  ANIONGAP '9 8 9     '$ Hematology Recent Labs  Lab 02/16/22 0619 02/22/22 0459  WBC 8.1 7.3  RBC 4.31 4.27  HGB 13.4 13.1  HCT 41.1 40.5  MCV 95.4 94.8  MCH 31.1 30.7  MCHC 32.6 32.3  RDW 15.6* 15.9*  PLT 303 367    Cardiac Enzymes  Recent Labs  Lab 02/05/22 0218 02/05/22 0403  TROPONINIHS 370* 367*      BNP    Component Value Date/Time   BNP 919.6 (H) 02/05/2022 0218    HbA1c  Lab Results  Component Value Date   HGBA1C 7.7 (H) 02/05/2022    Radiology    ------------  Telemetry    NSR, rates 90-100s - Personally Reviewed  Cardiovascular Studies   2D Echocardiogram 6.20.2023  1. Left ventricular ejection fraction, by estimation, is 30 to 35%. The  left ventricle has moderately decreased function. The left ventricle  demonstrates global hypokinesis. Left ventricular diastolic function could  not be evaluated.   2. RV not well visualized but likely enlarged.   3. Large pleural effusion.   4. The mitral valve is normal in structure. Mild mitral valve  regurgitation.   5. The aortic valve is normal in structure. Aortic valve regurgitation is  not visualized.  _____________   Peripheral Angiography 6.28.2023  Findings:               Aortogram:  This demonstrated normal renal arteries and normal aorta and iliac segments without significant stenosis.             Left Lower Extremity: This demonstrated normal common femoral artery, profunda femoris artery was small but patent.  The SFA and popliteal arteries were widely patent.  There was a normal tibial trifurcation and three-vessel runoff distally with the posterior tibial artery being the dominant flow into the foot             Right Lower Extremity:  Imaging demonstrated  normal common femoral artery, small but patent profunda femoris artery, SFA and popliteal arteries are widely patent.  The posterior tibial artery was again the dominant runoff but there were no focal stenosis in all 3 tibial vessels.  _____________   Cardiac Catheterization  7.5.2023  Left Main  Vessel is large. The vessel exhibits minimal luminal irregularities.  Left Anterior Descending  Vessel is large.  Prox LAD to Mid LAD lesion is 80% stenosed.  Mid LAD-1 lesion is 99% stenosed.  Mid LAD-2 lesion is 50% stenosed.  First Diagonal Branch  Vessel is large in size.  1st Diag lesion is 100% stenosed. The lesion is chronically  occluded.  Ramus Intermedius  Vessel is small. There is moderate diffuse disease throughout the vessel.  Left Circumflex  Vessel is large.  Prox Cx lesion is 60% stenosed.  Mid Cx to Dist Cx lesion is 60% stenosed.  First Obtuse Marginal Branch  Vessel is large in size.  Collaterals  1st Mrg filled by collaterals from 3rd RPL.    1st Mrg lesion is 100% stenosed. The lesion is chronically occluded.  Lateral First Obtuse Marginal Branch  Collaterals  Lat 1st Mrg filled by collaterals from 3rd RPL.    Second Obtuse Marginal Branch  Vessel is small in size. There is severe disease in the vessel.  Third Obtuse Marginal Branch  Vessel is small in size.  Right Coronary Artery  Vessel is large. There is mild diffuse disease throughout the vessel.  Prox RCA lesion is 40% stenosed.  Dist RCA lesion is 70% stenosed.  Right Posterior Descending Artery  Vessel is small in size.  Collaterals  RPDA filled by collaterals from 1st Sept.    Collaterals  RPDA filled by collaterals from 2nd Sept.    RPDA lesion is 100% stenosed. The lesion is chronically occluded.  Right Posterior Atrioventricular Artery  Vessel is moderate in size.  First Right Posterolateral Branch  Vessel is small in size.  Second Right Posterolateral Branch  Vessel is small in size.   Third Right Posterolateral Branch  Vessel is moderate in size.   Right Heart Pressures RA (mean): 9 mmHg RV (S/EDP): 60/14 mmHg PA (S/D, mean): 60/25 (37) mmHg (diastolic pressure may be underestimated to due artifact on tracing) PCWP (mean): 30 mmHg with prominent V-waves up to 45 mmHg  Ao sat: 93% PA sat: 64%  Fick CO: 5.7 L/min Fick CI: 2.5 L/min/m^2  _____________    Patient Profile     56 y.o. male with a history of multivessel CAD, HFrEF, obesity, smoker x20+ years presenting with shortness of breath and edema, being seen for acute HFrEF and multivessel CAD.  Assessment & Plan    1.  Acute HFrEF/Ischemic Cardiomyopathy: EF 30-35%, creatinine stable wdl. Minus 912 ml yesterday and minus 35k ml since admission.  Wt down ~ 40 kg since admission  105.6 kg this AM. Continue lasix 80 mg BID and metolazone 2.5 mg daily for diuresis. Low BPs limiting additional use of GDMT.  Midodrine currently BID, increase to TID. Continue Jardiance and digoxin. Hold spironolactone until BP allows and weaned off midodrine.  2.  Multivessel CAD: CTO mLAD, D1, rPDA. Continue Iv diuresis. Plan for Cardiac MRI tomorrow 7/7 am to evaluate for viability. Pt states he is claustrophobic, hx of anxiety, plan to pre-medicate.   3.  Tobacco use: declines nicotine replacement therapy, cessation recommended   Signed, Murray Hodgkins, NP  02/22/2022, 11:38 AM    For questions or updates, please contact   Please consult [email or web address] for contact info under Cardiology/STEMI.

## 2022-02-22 NOTE — Progress Notes (Addendum)
Pt was requesting for xanax but was informed that RN nurse just gave it to him at 1850. Pt got upset and start cussing at staff. Pt was informed to not need to cuss at staff and will notify provider for request . NP Randol Kern made aware and placed order. Will continue to montior.

## 2022-02-23 ENCOUNTER — Inpatient Hospital Stay (HOSPITAL_COMMUNITY): Payer: Medicaid Other

## 2022-02-23 ENCOUNTER — Inpatient Hospital Stay: Payer: Medicaid Other

## 2022-02-23 DIAGNOSIS — I5021 Acute systolic (congestive) heart failure: Secondary | ICD-10-CM | POA: Diagnosis not present

## 2022-02-23 DIAGNOSIS — I5043 Acute on chronic combined systolic (congestive) and diastolic (congestive) heart failure: Secondary | ICD-10-CM

## 2022-02-23 DIAGNOSIS — I2510 Atherosclerotic heart disease of native coronary artery without angina pectoris: Secondary | ICD-10-CM | POA: Diagnosis not present

## 2022-02-23 LAB — BASIC METABOLIC PANEL
Anion gap: 9 (ref 5–15)
BUN: 50 mg/dL — ABNORMAL HIGH (ref 6–20)
CO2: 35 mmol/L — ABNORMAL HIGH (ref 22–32)
Calcium: 9.3 mg/dL (ref 8.9–10.3)
Chloride: 91 mmol/L — ABNORMAL LOW (ref 98–111)
Creatinine, Ser: 1.23 mg/dL (ref 0.61–1.24)
GFR, Estimated: >60 mL/min (ref >60)
Glucose, Bld: 192 mg/dL — ABNORMAL HIGH (ref 70–99)
Potassium: 3.8 mmol/L (ref 3.5–5.1)
Sodium: 135 mmol/L (ref 135–145)

## 2022-02-23 LAB — CBC
HCT: 40.8 % (ref 39.0–52.0)
Hemoglobin: 13.3 g/dL (ref 13.0–17.0)
MCH: 30.6 pg (ref 26.0–34.0)
MCHC: 32.6 g/dL (ref 30.0–36.0)
MCV: 94 fL (ref 80.0–100.0)
Platelets: 368 10*3/uL (ref 150–400)
RBC: 4.34 MIL/uL (ref 4.22–5.81)
RDW: 15.3 % (ref 11.5–15.5)
WBC: 7.3 10*3/uL (ref 4.0–10.5)
nRBC: 0 % (ref 0.0–0.2)

## 2022-02-23 LAB — GLUCOSE, CAPILLARY
Glucose-Capillary: 172 mg/dL — ABNORMAL HIGH (ref 70–99)
Glucose-Capillary: 258 mg/dL — ABNORMAL HIGH (ref 70–99)
Glucose-Capillary: 178 mg/dL — ABNORMAL HIGH (ref 70–99)
Glucose-Capillary: 181 mg/dL — ABNORMAL HIGH (ref 70–99)

## 2022-02-23 LAB — LIPOPROTEIN A (LPA): Lipoprotein (a): 89.6 nmol/L — ABNORMAL HIGH (ref <75.0)

## 2022-02-23 LAB — DIGOXIN LEVEL: Digoxin Level: 0.2 ng/mL — ABNORMAL LOW (ref 0.8–2.0)

## 2022-02-23 MED ORDER — MIDODRINE HCL 5 MG PO TABS
2.5000 mg | ORAL_TABLET | Freq: Three times a day (TID) | ORAL | Status: DC
Start: 2022-02-23 — End: 2022-02-25
  Administered 2022-02-23 – 2022-02-25 (×6): 2.5 mg via ORAL
  Filled 2022-02-23 (×6): qty 1

## 2022-02-23 MED ORDER — ATORVASTATIN CALCIUM 80 MG PO TABS
80.0000 mg | ORAL_TABLET | Freq: Every day | ORAL | Status: DC
  Administered 2022-02-23 – 2022-03-01 (×7): 80 mg via ORAL
  Filled 2022-02-23 (×7): qty 1

## 2022-02-23 MED ORDER — LORAZEPAM 2 MG/ML IJ SOLN
1.0000 mg | Freq: Once | INTRAMUSCULAR | Status: AC
  Administered 2022-02-23: 1 mg via INTRAVENOUS
  Filled 2022-02-23: qty 1

## 2022-02-23 MED ORDER — GADOBUTROL 1 MMOL/ML IV SOLN
10.0000 mL | Freq: Once | INTRAVENOUS | Status: AC | PRN
  Administered 2022-02-23: 10 mL via INTRAVENOUS

## 2022-02-23 NOTE — Progress Notes (Addendum)
Cardiology Progress Note   Patient Name: Lawrence Murray Date of Encounter: 02/23/2022  Primary Cardiologist: Kate Sable, MD  Subjective   No chest pain or sob overnight.  Notes that he had significant urine output yesterday.  Still w/ significant lower ext edema.  For cardiac MRI today.  Inpatient Medications    Scheduled Meds:  aspirin EC  81 mg Oral Daily   digoxin  0.125 mg Oral Daily   empagliflozin  10 mg Oral Daily   enoxaparin (LOVENOX) injection  0.5 mg/kg Subcutaneous Q24H   furosemide  80 mg Intravenous BID   insulin aspart  0-15 Units Subcutaneous TID WC   insulin aspart  0-5 Units Subcutaneous QHS   insulin aspart  3 Units Subcutaneous TID WC   methocarbamol  750 mg Oral QID   metolazone  2.5 mg Oral Daily   midodrine  2.5 mg Oral TID WC   pantoprazole  40 mg Oral Daily   pregabalin  100 mg Oral TID   QUEtiapine  25 mg Oral QHS   sodium chloride flush  3 mL Intravenous Q12H   spironolactone  12.5 mg Oral Daily   Continuous Infusions:  sodium chloride     PRN Meds: sodium chloride, acetaminophen, ALPRAZolam, bisacodyl, ondansetron (ZOFRAN) IV, ondansetron (ZOFRAN) IV, oxyCODONE, sodium chloride flush   Vital Signs    Vitals:   02/23/22 0347 02/23/22 0424 02/23/22 0737 02/23/22 1126  BP: 99/65  103/74 91/68  Pulse: 86  80 97  Resp: '20  18 20  '$ Temp: 98.2 F (36.8 C)  97.9 F (36.6 C) 97.9 F (36.6 C)  TempSrc: Oral  Oral   SpO2: 93%  100% 98%  Weight:  104.2 kg    Height:        Intake/Output Summary (Last 24 hours) at 02/23/2022 1201 Last data filed at 02/23/2022 1005 Gross per 24 hour  Intake 360 ml  Output 3525 ml  Net -3165 ml   Filed Weights   02/22/22 0022 02/22/22 0247 02/23/22 0424  Weight: 105.6 kg 105.6 kg 104.2 kg    Physical Exam   GEN: obese male, in no acute distress.  HEENT: Grossly normal.  Neck: Supple, no JVD, carotid bruits, or masses. Cardiac: RRR, no murmurs, rubs, or gallops. No clubbing, cyanosis. +2 bilat LE  edema to knees, lower legs wrapped bilat.   Respiratory:  Respirations regular and unlabored, bibasilar crackles. GI: Obese, semi-firm, nontender, BS + x 4. MS: no deformity or atrophy. Skin: warm and dry, no rash. Neuro:  Strength and sensation are intact. Psych: AAOx3.  Normal affect.  Labs    Chemistry Recent Labs  Lab 02/21/22 0636 02/22/22 0459 02/23/22 0410  NA 135 133* 135  K 4.1 4.2 3.8  CL 93* 92* 91*  CO2 34* 32 35*  GLUCOSE 176* 186* 192*  BUN 32* 36* 50*  CREATININE 1.04 1.23 1.23  CALCIUM 9.1 9.1 9.3  GFRNONAA >60 >60 >60  ANIONGAP '8 9 9     '$ Hematology Recent Labs  Lab 02/22/22 0459 02/23/22 0410  WBC 7.3 7.3  RBC 4.27 4.34  HGB 13.1 13.3  HCT 40.5 40.8  MCV 94.8 94.0  MCH 30.7 30.6  MCHC 32.3 32.6  RDW 15.9* 15.3  PLT 367 368    Cardiac Enzymes  Recent Labs  Lab 02/05/22 0218 02/05/22 0403  TROPONINIHS 370* 367*      BNP    Component Value Date/Time   BNP 919.6 (H) 02/05/2022 0218   HbA1c  Lab  Results  Component Value Date   HGBA1C 7.7 (H) 02/05/2022    Radiology     --------------  Telemetry    NSR, rates in the 80s, occasional PVCs - Personally Reviewed  ECG    No new readings - Personally Reviewed  Cardiac Studies   2D Echocardiogram 6.20.2023   1. Left ventricular ejection fraction, by estimation, is 30 to 35%. The  left ventricle has moderately decreased function. The left ventricle  demonstrates global hypokinesis. Left ventricular diastolic function could  not be evaluated.   2. RV not well visualized but likely enlarged.   3. Large pleural effusion.   4. The mitral valve is normal in structure. Mild mitral valve  regurgitation.   5. The aortic valve is normal in structure. Aortic valve regurgitation is  not visualized.  _____________    Peripheral Angiography 6.28.2023   Findings:               Aortogram:  This demonstrated normal renal arteries and normal aorta and iliac segments without significant  stenosis.             Left Lower Extremity: This demonstrated normal common femoral artery, profunda femoris artery was small but patent.  The SFA and popliteal arteries were widely patent.  There was a normal tibial trifurcation and three-vessel runoff distally with the posterior tibial artery being the dominant flow into the foot             Right Lower Extremity:  Imaging demonstrated normal common femoral artery, small but patent profunda femoris artery, SFA and popliteal arteries are widely patent.  The posterior tibial artery was again the dominant runoff but there were no focal stenosis in all 3 tibial vessels.  _____________    Cardiac Catheterization  7.5.2023                   Left Main       Vessel is large. The vessel exhibits minimal luminal irregularities.       Left Anterior Descending         Vessel is large.         Prox LAD to Mid LAD lesion is 80% stenosed.         Mid LAD-1 lesion is 99% stenosed.         Mid LAD-2 lesion is 50% stenosed.         First Diagonal Branch   Vessel is large in size.   1st Diag lesion is 100% stenosed. The lesion is chronically occluded.   Ramus Intermedius  Vessel is small. There is moderate diffuse disease throughout the vessel.  Left Circumflex           Vessel is large.           Prox Cx lesion is 60% stenosed.           Mid Cx to Dist Cx lesion is 60% stenosed.           First Obtuse Marginal Branch    Vessel is large in size.    Collaterals    1st Mrg filled by collaterals from 3rd RPL.         1st Mrg lesion is 100% stenosed. The lesion is chronically occluded.    Lateral First Obtuse Marginal Branch          Collaterals          Lat 1st Mrg filled by collaterals from 3rd RPL.  Second Obtuse Marginal Branch        Vessel is small in size. There is severe disease in the vessel.        Third Obtuse Marginal Branch                Vessel is small in size.                Right Coronary Artery     Vessel is  large. There is mild diffuse disease throughout the vessel.     Prox RCA lesion is 40% stenosed.     Dist RCA lesion is 70% stenosed.     Right Posterior Descending Artery      Vessel is small in size.      Collaterals      RPDA filled by collaterals from 1st Sept.             Collaterals      RPDA filled by collaterals from 2nd Sept.             RPDA lesion is 100% stenosed. The lesion is chronically occluded.      Right Posterior Atrioventricular Artery            Vessel is moderate in size.            First Right Posterolateral Branch               Vessel is small in size.               Second Right Posterolateral Branch             Vessel is small in size.             Third Right Posterolateral Branch              Vessel is moderate in size.                Right Heart Pressures RA (mean): 9 mmHg RV (S/EDP): 60/14 mmHg PA (S/D, mean): 60/25 (37) mmHg (diastolic pressure may be underestimated to due artifact on tracing) PCWP (mean): 30 mmHg with prominent V-waves up to 45 mmHg  Ao sat: 93% PA sat: 64%  Fick CO: 5.7 L/min Fick CI: 2.5 L/min/m^2  _____________   Patient Profile     56 y.o. male with a history of multivessel CAD, HFrEF, obesity, smoker x20+ years presenting with shortness of breath and edema, being seen for acute HFrEF and multivessel CAD.  Assessment & Plan    1.  Acute HFrEF/Ischemic Cardiomyopathy: EF 30-35%, creatinine stable though BUN/bicarb bumping slightly.  Minus 912 ml yesterday and minus 35k ml since admission.  Wt down ~ 40 kg since admission  104.2 kg this AM. Continue lasix 80 mg BID and metolazone 2.5 mg daily for diuresis. Low BPs limiting additional use of GDMT. Midodrine currently BID, increase to TID. Continue Jardiance, spiro, and digoxin.  Dig level ok this AM.  cMRI today to assess for viability.   2.  Multivessel CAD/NSTEMI: CTO D1, OM, and rPDA w/ severe, subtotal occlusive dzs of the mid LAD. No chest pain.  Dyspnea improved w/  diuresis.  Plan for Cardiac MRI today to evaluate for viability and determine utility of CT surgical eval vs PCI. Pt states he is claustrophobic, hx of anxiety, plan to pre-medicate.  Cont asa.  Add statin.  No ? blocker in the setting of relative hypotension.   3.  DMII:  A1c 7.7.  On SSI  and jardiance.  Per IM.  4.  Tobacco use: declines nicotine replacement therapy, cessation recommended  5.  Lipids:  none on file.  Will check in AM.  Adding statin in setting of #2.  6.  Hypotension:  BPs trending 90's to low 100's on midodrine.  Will titrate to TID.  Signed, Murray Hodgkins, NP  02/23/2022, 12:01 PM    For questions or updates, please contact   Please consult [email or web address] for contact info under Cardiology/STEMI.

## 2022-02-23 NOTE — Progress Notes (Signed)
PROGRESS NOTE    Lawrence Murray  YQM:[id number] DOB: 02-Sep-1965  DOA: 02/05/2022 Date of Service: 02/23/22 PCP: Merryl Hacker, No     Brief Narrative / Hospital Course:  56 y.o. male who has not seen a doctor in many years, presented 02/05/22 to the hospital because of shortness of breath and significant swelling in the lower extremities. He was admitted to the hospital for acute on chronic systolic and diastolic CHF with anasarca.  2D echo showed estimated EF of 30 to 52% but LV diastolic function could not be evaluated.  He was treated with IV Lasix.  BP was soft so midodrine was started to allow for adequate diuresis.  Troponin was elevated but this was attributed to demand ischemia.  Cardiologist recommended left and right heart cath once he is euvolemic. There was concern for peripheral arterial disease.  Patient underwent angiogram with vascular surgery on 6/28.  Good arterial flow noted.  No intervention was indicated.  Volume status is markedly improved.  As of 6/30 patient is 25.5 L net negative.  Symptomatically improved. 7/3, patient tolerating diuretics well.  Focused on shooting pain in bilateral lower extremities. 7/4: Leg pain improved.  Seen by cardiology this morning.  Patient will be set up for left and right heart cath and possible PCI 7/5 7/5: L/R Cardiac Cath (+)severe three-vessel CAD appears chronic. Left and right heart pressures high (LVEDP 32 mmHg). Cardio recommend few more days aggressive diuresis, added metolazone to his scheduled Lasix. Planning MRI viability study to help guide revascularization options - scheduled/planned for 07/07. 7/6: planning MR cardiac morphology study tomorrow. Net IO Since Admission: -34,917 mL [02/22/22 0915].  7/7: Net IO Since Admission: -39,572 mL [02/23/22 1737]. Cardiac MRI: Severely reduced Bi-ventricular function.  LVEF 16%, RVEF 19%. Findings consistent with ischemic cardiomyopathy with viable LV lateral and inferior walls. Non-viable LV  anterior and apical wall. Per cardiology: DC metolazone, consider switching to oral furosemide over the weekend.  Given MRI findings, minimal benefit from CABG, recommending aggressive medical therapy and no revascularization.   Consultants:  Cardiology   Procedures: 02/21/22 Left and Right Heart Catheterization w/ Dr End     Subjective: Patient reports unhappy w/ his carb restricted diet - want to eat fruits. Reports intermittent pain/tingling in lower extremities persists but not too abd.      ASSESSMENT & PLAN:   Principal Problem:   Acute on chronic combined systolic and diastolic CHF (congestive heart failure) (HCC) Active Problems:   Smoker   Type 2 diabetes mellitus with hyperglycemia (HCC)   PVD (peripheral vascular disease) (HCC)   Acute HFrEF (heart failure with reduced ejection fraction) (HCC)   Coronary artery disease   Coronary artery disease Per Dr End (cardio) 07/05: Cath showed severe three-vessel CAD that appears chronic. Left and right heart pressures high (LVEDP 32 mmHg). Recommend few more days aggressive diuresis, added metolazone to his scheduled Lasix. Planning MRI viability study for tomorrow 07/07 to help guide revascularization options.  Acute on chronic systolic and diastolic congestive heart failure Anasarca 2D echocardiogram EF 30 to 84% Diastolic dysfunction indeterminate, suspect some element of diastolic dysfunction Net IO Since Admission: -39,572 mL [02/23/22 1736] Plan: Continue Lasix 80 mg IV twice daily  Continue Daily aldactone Daily renal function, creatinine has remained stable Continue midodrine 2.5 mg 3 times daily Unable to add GDMT at this time d/t low BP  Cardiac MRI 07/07: Severely reduced Bi-ventricular function.  LVEF 16%, RVEF 19%. Findings consistent with ischemic cardiomyopathy with viable LV lateral  and inferior walls. Non-viable LV anterior and apical wall.  07/07: Per cardiology: DC metolazone, consider switching to oral  furosemide over the weekend.  Given MRI findings, minimal benefit from CABG, recommending aggressive medical therapy and no revascularization.   Hypotension BP remains soft Continue midodrine 2.5 3 times daily Hold GDMT   Elevated troponin Likely secondary demand ischemia No indication of ACS   Bilateral pleural effusions Likely secondary to decompensated heart failure Patient on room air, clear lungs   Type 2 diabetes mellitus Hemoglobin A1c 7.7 SSI with NovoLog Continue empagliflozin Consider metformin at discharge   Peripheral vascular disease Peripheral neuropathy Lower extremity angiogram reassuring Gabapentin not effective Seems to have been a result of Lyrica Plan: Continue Lyrica 100 3 times daily Continue Robaxin Avoid narcotics except breakthrough    Anxiety Seroquel as needed as needed Xanax   Tobacco use disorder Counseled patient    DVT prophylaxis: lovenox Code Status: FULL Family Communication: pt declined my offer to update family / support person  Disposition Plan / TOC needs: likely back to home environment but may need HH/SNF pending PT/OT eval when medically improved  Barriers to discharge / significant pending items: continued IV diuresis and further cardiac workup pending per cardiology recommendations              Objective: Vitals:   02/23/22 0737 02/23/22 1126 02/23/22 1659 02/23/22 1701  BP: 103/74 91/68 (!) 87/60 97/66  Pulse: 80 97 91 92  Resp: '18 20 18   '$ Temp: 97.9 F (36.6 C) 97.9 F (36.6 C) 98 F (36.7 C)   TempSrc: Oral Oral    SpO2: 100% 98% 99%   Weight:      Height:        Intake/Output Summary (Last 24 hours) at 02/23/2022 1736 Last data filed at 02/23/2022 1504 Gross per 24 hour  Intake 360 ml  Output 3905 ml  Net -3545 ml    Filed Weights   02/22/22 0022 02/22/22 0247 02/23/22 0424  Weight: 105.6 kg 105.6 kg 104.2 kg    Examination:  Constitutional:  VS as above General Appearance: alert,  well-developed, well-nourished, NAD Ears, Nose, Mouth, Throat: Normal appearance Neck: No masses, trachea midline Respiratory: Normal respiratory effort Breath sounds normal, no wheeze/rhonchi/rales Cardiovascular: S1/S2 normal, no murmur/rub/gallop auscultated Unable to assess lower extremity edema - both legs bandaged/wrapped  Gastrointestinal: Nontender, no masses Musculoskeletal:  No clubbing/cyanosis of digits Neurological: No cranial nerve deficit on limited exam Motor and sensation intact and symmetric Psychiatric: Normal judgment/insight Normal mood and affect       Scheduled Medications:   aspirin EC  81 mg Oral Daily   atorvastatin  80 mg Oral Daily   digoxin  0.125 mg Oral Daily   empagliflozin  10 mg Oral Daily   enoxaparin (LOVENOX) injection  0.5 mg/kg Subcutaneous Q24H   furosemide  80 mg Intravenous BID   insulin aspart  0-15 Units Subcutaneous TID WC   insulin aspart  0-5 Units Subcutaneous QHS   insulin aspart  3 Units Subcutaneous TID WC   methocarbamol  750 mg Oral QID   midodrine  2.5 mg Oral TID WC   pantoprazole  40 mg Oral Daily   pregabalin  100 mg Oral TID   QUEtiapine  25 mg Oral QHS   sodium chloride flush  3 mL Intravenous Q12H   spironolactone  12.5 mg Oral Daily    Continuous Infusions:  sodium chloride      PRN Medications:  sodium chloride,  acetaminophen, ALPRAZolam, bisacodyl, ondansetron (ZOFRAN) IV, ondansetron (ZOFRAN) IV, oxyCODONE, sodium chloride flush  Antimicrobials:  Anti-infectives (From admission, onward)    Start     Dose/Rate Route Frequency Ordered Stop   02/14/22 1156  ceFAZolin (ANCEF) IVPB 1 g/50 mL premix        over 30 Minutes  Continuous PRN 02/14/22 1200 02/14/22 1328   02/14/22 1018  ceFAZolin (ANCEF) IVPB 2g/100 mL premix  Status:  Discontinued        2 g 200 mL/hr over 30 Minutes Intravenous 30 min pre-op 02/14/22 1018 02/14/22 1328   02/13/22 1816  ceFAZolin (ANCEF) IVPB 2g/100 mL premix  Status:   Discontinued        2 g 200 mL/hr over 30 Minutes Intravenous 30 min pre-op 02/13/22 1816 02/14/22 1023       Data Reviewed: I have personally reviewed following labs and imaging studies  CBC: Recent Labs  Lab 02/22/22 0459 02/23/22 0410  WBC 7.3 7.3  HGB 13.1 13.3  HCT 40.5 40.8  MCV 94.8 94.0  PLT 367 834    Basic Metabolic Panel: Recent Labs  Lab 02/19/22 0458 02/20/22 0603 02/21/22 0636 02/22/22 0459 02/23/22 0410  NA 138 135 135 133* 135  K 4.0 4.3 4.1 4.2 3.8  CL 95* 95* 93* 92* 91*  CO2 31 31 34* 32 35*  GLUCOSE 210* 186* 176* 186* 192*  BUN 31* 33* 32* 36* 50*  CREATININE 1.09 1.04 1.04 1.23 1.23  CALCIUM 9.1 9.1 9.1 9.1 9.3    GFR: Estimated Creatinine Clearance: 83 mL/min (by C-G formula based on SCr of 1.23 mg/dL). Liver Function Tests: No results for input(s): "AST", "ALT", "ALKPHOS", "BILITOT", "PROT", "ALBUMIN" in the last 168 hours. No results for input(s): "LIPASE", "AMYLASE" in the last 168 hours. No results for input(s): "AMMONIA" in the last 168 hours. Coagulation Profile: No results for input(s): "INR", "PROTIME" in the last 168 hours. Cardiac Enzymes: No results for input(s): "CKTOTAL", "CKMB", "CKMBINDEX", "TROPONINI" in the last 168 hours. BNP (last 3 results) No results for input(s): "PROBNP" in the last 8760 hours. HbA1C: No results for input(s): "HGBA1C" in the last 72 hours. CBG: Recent Labs  Lab 02/22/22 1658 02/22/22 2042 02/23/22 0734 02/23/22 1129 02/23/22 1657  GLUCAP 153* 299* 172* 258* 178*    Lipid Profile: No results for input(s): "CHOL", "HDL", "LDLCALC", "TRIG", "CHOLHDL", "LDLDIRECT" in the last 72 hours. Thyroid Function Tests: No results for input(s): "TSH", "T4TOTAL", "FREET4", "T3FREE", "THYROIDAB" in the last 72 hours. Anemia Panel: No results for input(s): "VITAMINB12", "FOLATE", "FERRITIN", "TIBC", "IRON", "RETICCTPCT" in the last 72 hours. Urine analysis: No results found for: "COLORURINE",  "APPEARANCEUR", "LABSPEC", "PHURINE", "GLUCOSEU", "HGBUR", "BILIRUBINUR", "KETONESUR", "PROTEINUR", "UROBILINOGEN", "NITRITE", "LEUKOCYTESUR" Sepsis Labs: '@LABRCNTIP'$ (procalcitonin:4,lacticidven:4)  Recent Results (from the past 240 hour(s))  Aerobic Culture w Gram Stain (superficial specimen)     Status: Abnormal   Collection Time: 02/15/22  2:20 PM   Specimen: Wound  Result Value Ref Range Status   Specimen Description   Final    WOUND Performed at [facility], [address], [place], [place] [address]    Special Requests   Final    HS STER Performed at [facility], [address] [place], [place] [address]    Gram Stain   Final    NO SQUAMOUS EPITHELIAL CELLS SEEN NO WBC SEEN ABUNDANT GRAM NEGATIVE RODS ABUNDANT GRAM POSITIVE COCCI    Culture (A)  Final    MULTIPLE ORGANISMS PRESENT, NONE PREDOMINANT NO STAPHYLOCOCCUS  AUREUS ISOLATED NO GROUP A STREP (S.PYOGENES) ISOLATED Performed at [facility], [place] [address], [place], [place] [address]    Report Status 02/18/2022 FINAL  Final         Radiology Studies last 96 hours: MR CARDIAC MORPHOLOGY W WO CONTRAST  Result Date: 02/23/2022 CLINICAL DATA:  Ischemic cardiomyopathy, evaluate for viability. EXAM: CARDIAC MRI TECHNIQUE: The patient was scanned on a 1.5 Tesla Siemens magnet. A dedicated cardiac coil was used. Functional imaging was done using Fiesta sequences. 2,3, and 4 chamber views were done to assess for RWMA's. Modified Simpson's rule using a short axis stack was used to calculate an ejection fraction on a dedicated work Conservation officer, nature. The patient received 10 cc of Gadavist. After 10 minutes inversion recovery sequences were used to assess for infiltration and scar tissue. CONTRAST:  10 cc  of Gadavist FINDINGS: 1. Mildly dilated left ventricular size, severely reduced LV systolic function (LVEF = 16%). There is global hypokinesis with anterior LV wall akinesis. The entire  LV anterior wall appears thinned. There is transmural subendocardial late gadolinium enhancement in the entire anterior wall and apex of the left ventricular myocardium. There is 25-50% subendocardial LGE/scar in the LV lateral wall. LVEDV: 265 ml LVESV: 223 ml SV: 42 ml CO: 3.6 L/min Myocardial mass: 138 g 2. Normal right ventricular size, thickness, severely reduced systolic function (RVEF = 19%). There is global hypokinesis. 3.  Normal left and right atrial size. 4. Normal size of the aortic root, ascending aorta and pulmonary artery. 5.  No significant valvular abnormalities. 6.  Normal pericardium.  No pericardial effusion. IMPRESSION: 1.  Severely reduced Bi-ventricular function.  LVEF 16%, RVEF 19%. 2. There is transmural subendocardial scar in the LV anterior wall and apex (LAD territory), indicating non viability. 3.  There is 25-50% subendocardial LGE/scar in the LV lateral wall. 4. The LV lateral and inferior walls appear viable (RCA and LCx territory). 5.  No significant valvular abnormalities. 6. Findings consistent with ischemic cardiomyopathy with viable LV lateral and inferior walls. Non-viable LV anterior and apical wall. Electronically Signed   By: Kate Sable M.D.   On: 02/23/2022 16:23   CARDIAC CATHETERIZATION  Result Date: 02/21/2022 Conclusions: Severe three-vessel coronary artery disease, including chronic total/subtotal occlusions of large D1 and OM1 branches, mid LAD, and ostial rPDA.  There is also moderate-severe disease involving the proximal and distal LCx as well as the distal RCA. Moderately elevated left heart, right heart, and pulmonary artery pressures (LVEDP 32 mmHg, PCWP 30 mmHg, mean RA 9 mmHg, RVEDP 14 mmHg, and mean PAP 37 mmHg). Low normal Fick cardiac output/index (CO 5.7 L/min, CI 2.5 L/min/m^2). Recommendations: Escalate diuresis, as filling pressures indicate persistent volume overload.  I will add metolazone 2.5 mg daily to current regimen of furosemide 80 mg  IV twice daily. Escalate goal-directed medical therapy for acute HFrEF due to ischemic cardiomyopathy as blood pressure and renal function allow. Once optimized from a volume standpoint, recommend cardiac MRI to assess for viability to help guide revascularization strategy.  If LAD +/- LCx territory is viable, evaluation cardiac surgery consultation will need to be considered. Nelva Bush, MD [facility]           LOS: 18 days     Emeterio Reeve, DO [facility] 02/23/2022, 5:36 PM   Staff may message me via secure chat in [place]  but this may not receive immediate response,  please page for urgent matters!  If 7PM-7AM, please  contact night-coverage [email or web address]  Dictation software was used to generate the above note. Typos may occur and escape review, as with typed/written notes. Please contact Dr Sheppard Coil directly for clarity if needed.

## 2022-02-24 DIAGNOSIS — I5043 Acute on chronic combined systolic (congestive) and diastolic (congestive) heart failure: Secondary | ICD-10-CM | POA: Diagnosis not present

## 2022-02-24 DIAGNOSIS — I2510 Atherosclerotic heart disease of native coronary artery without angina pectoris: Secondary | ICD-10-CM | POA: Diagnosis not present

## 2022-02-24 DIAGNOSIS — I5021 Acute systolic (congestive) heart failure: Secondary | ICD-10-CM | POA: Diagnosis not present

## 2022-02-24 LAB — LIPID PANEL
Cholesterol: 242 mg/dL — ABNORMAL HIGH (ref 0–200)
HDL: 35 mg/dL — ABNORMAL LOW (ref >40)
LDL Cholesterol: 160 mg/dL — ABNORMAL HIGH (ref 0–99)
Total CHOL/HDL Ratio: 6.9 RATIO
Triglycerides: 235 mg/dL — ABNORMAL HIGH (ref <150)
VLDL: 47 mg/dL — ABNORMAL HIGH (ref 0–40)

## 2022-02-24 LAB — BASIC METABOLIC PANEL
Anion gap: 12 (ref 5–15)
BUN: 50 mg/dL — ABNORMAL HIGH (ref 6–20)
CO2: 36 mmol/L — ABNORMAL HIGH (ref 22–32)
Calcium: 9.5 mg/dL (ref 8.9–10.3)
Chloride: 84 mmol/L — ABNORMAL LOW (ref 98–111)
Creatinine, Ser: 1.47 mg/dL — ABNORMAL HIGH (ref 0.61–1.24)
GFR, Estimated: 56 mL/min — ABNORMAL LOW (ref >60)
Glucose, Bld: 205 mg/dL — ABNORMAL HIGH (ref 70–99)
Potassium: 3.8 mmol/L (ref 3.5–5.1)
Sodium: 132 mmol/L — ABNORMAL LOW (ref 135–145)

## 2022-02-24 LAB — DIGOXIN LEVEL: Digoxin Level: 0.3 ng/mL — ABNORMAL LOW (ref 0.8–2.0)

## 2022-02-24 LAB — GLUCOSE, CAPILLARY
Glucose-Capillary: 207 mg/dL — ABNORMAL HIGH (ref 70–99)
Glucose-Capillary: 195 mg/dL — ABNORMAL HIGH (ref 70–99)
Glucose-Capillary: 191 mg/dL — ABNORMAL HIGH (ref 70–99)
Glucose-Capillary: 171 mg/dL — ABNORMAL HIGH (ref 70–99)

## 2022-02-24 NOTE — Progress Notes (Addendum)
Cardiology Progress Note   Patient Name: Lawrence Murray Date of Encounter: 02/24/2022  Primary Cardiologist: Kate Sable, MD  Subjective   Status post cardiac MRI yesterday.  Feels well this morning.  Denies chest pain or dyspnea.  Very anxious about discharge disposition as he does not have adequate insurance and has to take care of his father and 55 year old son.  Inpatient Medications    Scheduled Meds:  aspirin EC  81 mg Oral Daily   atorvastatin  80 mg Oral Daily   digoxin  0.125 mg Oral Daily   empagliflozin  10 mg Oral Daily   enoxaparin (LOVENOX) injection  0.5 mg/kg Subcutaneous Q24H   insulin aspart  0-15 Units Subcutaneous TID WC   insulin aspart  0-5 Units Subcutaneous QHS   insulin aspart  3 Units Subcutaneous TID WC   methocarbamol  750 mg Oral QID   midodrine  2.5 mg Oral TID WC   pantoprazole  40 mg Oral Daily   pregabalin  100 mg Oral TID   QUEtiapine  25 mg Oral QHS   sodium chloride flush  3 mL Intravenous Q12H   spironolactone  12.5 mg Oral Daily   Continuous Infusions:  sodium chloride     PRN Meds: sodium chloride, acetaminophen, ALPRAZolam, bisacodyl, ondansetron (ZOFRAN) IV, ondansetron (ZOFRAN) IV, oxyCODONE, sodium chloride flush   Vital Signs    Vitals:   02/23/22 2351 02/24/22 0452 02/24/22 0457 02/24/22 0827  BP: 97/73  96/70 94/64  Pulse: 93  87 82  Resp: '20  20 17  '$ Temp: 98 F (36.7 C)  97.9 F (36.6 C) 98 F (36.7 C)  TempSrc:    Oral  SpO2: 98%  100% 98%  Weight:  104 kg    Height:        Intake/Output Summary (Last 24 hours) at 02/24/2022 1121 Last data filed at 02/24/2022 0900 Gross per 24 hour  Intake 1193 ml  Output 3655 ml  Net -2462 ml   Filed Weights   02/22/22 0247 02/23/22 0424 02/24/22 0452  Weight: 105.6 kg 104.2 kg 104 kg    Physical Exam   GEN: Well nourished, well developed, in no acute distress.  HEENT: Grossly normal.  Neck: Supple, no JVD, carotid bruits, or masses. Cardiac: RRR, no murmurs,  rubs, or gallops. No clubbing, cyanosis, 2+ bilateral lower extremity edema above his lower leg wraps.  Radials 2+.  Right radial catheterization site without bleeding, bruit, or hematoma. Respiratory:  Respirations regular and unlabored, clear to auscultation bilaterally. GI: Obese, soft, nontender, nondistended, BS + x 4. MS: no deformity or atrophy. Skin: warm and dry, no rash. Neuro:  Strength and sensation are intact. Psych: AAOx3.  Normal affect.  Labs    Chemistry Recent Labs  Lab 02/22/22 0459 02/23/22 0410 02/24/22 0614  NA 133* 135 132*  K 4.2 3.8 3.8  CL 92* 91* 84*  CO2 32 35* 36*  GLUCOSE 186* 192* 205*  BUN 36* 50* 50*  CREATININE 1.23 1.23 1.47*  CALCIUM 9.1 9.3 9.5  GFRNONAA >60 >60 56*  ANIONGAP '9 9 12     '$ Hematology Recent Labs  Lab 02/22/22 0459 02/23/22 0410  WBC 7.3 7.3  RBC 4.27 4.34  HGB 13.1 13.3  HCT 40.5 40.8  MCV 94.8 94.0  MCH 30.7 30.6  MCHC 32.3 32.6  RDW 15.9* 15.3  PLT 367 368    Cardiac Enzymes  Recent Labs  Lab 02/05/22 0218 02/05/22 0403  TROPONINIHS 370* 367*  BNP    Component Value Date/Time   BNP 919.6 (H) 02/05/2022 0218   Lipids  Lab Results  Component Value Date   CHOL 242 (H) 02/24/2022   HDL 35 (L) 02/24/2022   LDLCALC 160 (H) 02/24/2022   TRIG 235 (H) 02/24/2022   CHOLHDL 6.9 02/24/2022    HbA1c  Lab Results  Component Value Date   HGBA1C 7.7 (H) 02/05/2022    Radiology    --------  Telemetry    Regular sinus rhythm, 70s to 80s, occasional PVCs- Personally Reviewed  Cardiac Studies   2D Echocardiogram 6.20.2023   1. Left ventricular ejection fraction, by estimation, is 30 to 35%. The  left ventricle has moderately decreased function. The left ventricle  demonstrates global hypokinesis. Left ventricular diastolic function could  not be evaluated.   2. RV not well visualized but likely enlarged.   3. Large pleural effusion.   4. The mitral valve is normal in structure. Mild mitral  valve  regurgitation.   5. The aortic valve is normal in structure. Aortic valve regurgitation is  not visualized.  _____________    Peripheral Angiography 6.28.2023   Findings:               Aortogram:  This demonstrated normal renal arteries and normal aorta and iliac segments without significant stenosis.             Left Lower Extremity: This demonstrated normal common femoral artery, profunda femoris artery was small but patent.  The SFA and popliteal arteries were widely patent.  There was a normal tibial trifurcation and three-vessel runoff distally with the posterior tibial artery being the dominant flow into the foot             Right Lower Extremity:  Imaging demonstrated normal common femoral artery, small but patent profunda femoris artery, SFA and popliteal arteries are widely patent.  The posterior tibial artery was again the dominant runoff but there were no focal stenosis in all 3 tibial vessels.  _____________    Cardiac Catheterization  7.5.2023                                  Left Main            Vessel is large. The vessel exhibits minimal luminal irregularities.            Left Anterior Descending                Vessel is large.                Prox LAD to Mid LAD lesion is 80% stenosed.                Mid LAD-1 lesion is 99% stenosed.                Mid LAD-2 lesion is 50% stenosed.                First Diagonal Branch    Vessel is large in size.    1st Diag lesion is 100% stenosed. The lesion is chronically occluded.    Ramus Intermedius  Vessel is small. There is moderate diffuse disease throughout the vessel.  Left Circumflex                    Vessel is large.  Prox Cx lesion is 60% stenosed.                    Mid Cx to Dist Cx lesion is 60% stenosed.                    First Obtuse Marginal Branch      Vessel is large in size.      Collaterals      1st Mrg filled by collaterals from 3rd RPL.             1st Mrg lesion is 100%  stenosed. The lesion is chronically occluded.      Lateral First Obtuse Marginal Branch                  Collaterals                  Lat 1st Mrg filled by collaterals from 3rd RPL.                                     Second Obtuse Marginal Branch              Vessel is small in size. There is severe disease in the vessel.              Third Obtuse Marginal Branch                              Vessel is small in size.                              Right Coronary Artery        Vessel is large. There is mild diffuse disease throughout the vessel.        Prox RCA lesion is 40% stenosed.        Dist RCA lesion is 70% stenosed.        Right Posterior Descending Artery          Vessel is small in size.          Collaterals          RPDA filled by collaterals from 1st Sept.                     Collaterals          RPDA filled by collaterals from 2nd Sept.                     RPDA lesion is 100% stenosed. The lesion is chronically occluded.          Right Posterior Atrioventricular Artery                      Vessel is moderate in size.                      First Right Posterolateral Branch                            Vessel is small in size.                            Second Right Posterolateral  Branch                        Vessel is small in size.                        Third Right Posterolateral Branch                          Vessel is moderate in size.                            Right Heart Pressures RA (mean): 9 mmHg RV (S/EDP): 60/14 mmHg PA (S/D, mean): 60/25 (37) mmHg (diastolic pressure may be underestimated to due artifact on tracing) PCWP (mean): 30 mmHg with prominent V-waves up to 45 mmHg  Ao sat: 93% PA sat: 64%  Fick CO: 5.7 L/min Fick CI: 2.5 L/min/m^2  _____________   Cardiac MRI 7.7.2023  IMPRESSION: 1.  Severely reduced Bi-ventricular function.  LVEF 16%, RVEF 19%. 2. There is transmural subendocardial scar in the LV anterior wall and apex (LAD territory),  indicating non viability. 3.  There is 25-50% subendocardial LGE/scar in the LV lateral wall. 4. The LV lateral and inferior walls appear viable (RCA and LCx territory). 5.  No significant valvular abnormalities. 6. Findings consistent with ischemic cardiomyopathy with viable LV lateral and inferior walls. Non-viable LV anterior and apical wall. _____________   Patient Profile     56 y.o. male with a history of multivessel CAD, HFrEF, obesity, smoker x20+ years presenting with shortness of breath and edema, being seen for acute HFrEF and multivessel CAD.  Assessment & Plan    1.  Acute HFrEF/ischemic cardiomyopathy: EF 3035% by echo this admission.  Diagnostic catheterization with multivessel CAD (see below).  Cardiac MRI with severe biventricular dysfunction with an LVEF of 16% and RVEF of 19%.  He has transmural subendocardial scar in the LV anterior wall and apex indicating nonviability.  In this setting, currently planning medical therapy for residual coronary disease.  From heart failure standpoint, he has been diuresing well and was -3 L again yesterday and -41 L since admission.  Weight has been stable at 104 kg, down from 145.2 kg on admission.  He still has 2+ edema of his lower legs above the areas that are wrapped however, BUN and creatinine have risen to 50/1.47 with a bicarb of 36.  Lasix held this morning.  Suspect he would benefit from additional compression and leg elevation.  If renal function stable tomorrow, would likely transition to oral torsemide +/-metolazone.  Up to this point, soft blood pressures have limited guideline directed medical therapy and he remains on midodrine at 2.5 mg 3 times a day.  He is otherwise on Jardiance and digoxin.  Follow blood pressure and perhaps with transitioning to an oral diuretic, we will be able to wean off midodrine and add a low-dose ARB and/or beta-blocker.  Regardless of what we Rx, we will need to keep in mind the patient does not  currently have insurance and will likely require patient assistance.  2.  Multivessel CAD/non-STEMI: Chronic total occlusion of the first diagonal, OM, and RPDA, with severe, subtotal occlusive disease in the mid LAD.  He has not had any chest pain and dyspnea has improved diuresis.  Cardiac MRI yesterday showed no viability in the LAD territory with good viability in the circumflex and RCA  territories.  Catheterization films by Dr. Fletcher Anon on July 7.  No current role for intervention in the circumflex/RCA, thus plan medical therapy.  Continue aspirin and statin therapy.  He is not currently on beta-blocker in the setting of relative hypotension requiring midodrine.  3.  Type 2 diabetes mellitus: Newly diagnosed.  A1c 7.7.  On sign scale insulin and Jardiance.  Per internal medicine.  Will need transition of care assistance for Jardiance.  4.  Tobacco use: Cessation advised.  5.  Hyperlipidemia: LDL of 160 with a total cholesterol of 242.  Statin therapy was added on July 7..  Plan to follow-up as an outpatient.  6.  Relative hypotension: Blood pressures continue to trend in the 90s on low-dose midodrine therapy.  Follow and discontinue midodrine if possible.  7.  Lower extremity wounds: Legs are currently wrapped.  Will likely need ongoing wound care in the outpatient setting at home health if financially feasible.  Signed, Murray Hodgkins, NP  02/24/2022, 11:21 AM    Patient seen and examined  I agree with findings as noted above by C Berge Pt denies SOB at rest   No CP   Frustrated   Wants to be ok  Confused about all test findings  On exam  Pt sitting in chair No obvious JVD Lungs are CTA Back  Bandage on low back  No obvious edema Cardiac RRR  No S3 Abd   Supple Ext   Wrapped   1-2+ LE edema   Note some skin wrinkling  For now would hold diuretic as Cr has bumped   Will reassess Renal function tomorrow before dosing   Pt still with volume overload.  BP is marginal  I spent about  30 min talking to patient and his mother explaining what is going on, reviewing studies, reviewing normal heart anatomy compared to his.  They understand and pt seems less frustrated.     Will continue to follow  For questions or updates, please contact   Please consult [email or web address] for contact info under Cardiology/STEMI.

## 2022-02-24 NOTE — Progress Notes (Addendum)
PROGRESS NOTE    Lawrence Murray  NFA:[id number] DOB: 1965-11-09  DOA: 02/05/2022 Date of Service: 02/24/22 PCP: Kathyrn Lass     Hospital Course:  56 y.o. male who has not seen a doctor in many years, presented 02/05/22 to the hospital because of shortness of breath and significant swelling in the lower extremities. acute on chronic systolic and diastolic CHF with anasarca.  2D echo showed estimated EF of 30 to 46% but LV diastolic function could not be evaluated.  He was treated with IV Lasix.  BP was soft so midodrine was started.  Troponin was elevated but this was attributed to demand ischemia.  Cardiologist recommended left and right heart cath once he is euvolemic. There was concern for peripheral arterial disease.  Patient underwent angiogram with vascular surgery on 6/28.  Good arterial flow noted.  No intervention was indicated.  Volume status is markedly improved.  As of 6/30 patient is 25.5 L net negative.  Symptomatically improved. 7/3-7/4: patient tolerating diuretics well.  7/5: L/R Cardiac Cath (+)severe three-vessel CAD appears chronic. Left and right heart pressures high (LVEDP 32 mmHg). Cardio recommend few more days aggressive diuresis, added metolazone to his scheduled Lasix. Planning MRI viability study  7/6: planning MR cardiac morphology study tomorrow.  7/7: Net IO Since Admission: -39,572 mL [02/23/22 1737]. Cardiac MRI: Severely reduced Bi-ventricular function.  LVEF 16%, RVEF 19%. Findings consistent with ischemic cardiomyopathy with viable LV lateral and inferior walls. Non-viable LV anterior and apical wall. Per cardiology: DC metolazone, consider switching to oral furosemide over the weekend.  Given MRI findings, minimal benefit from CABG, recommending aggressive medical therapy and no revascularization. 7/8: Continues to diurese, Net IO Since Admission: -41,244 mL [02/24/22 0846]. Slight increase Cr to 1.5 from 1.2 past few days. If renal function stable tomorrow, would  likely transition to oral torsemide +/-metolazone, perhaps with transitioning to an oral diuretic, we will be able to wean off midodrine and add a low-dose ARB and/or beta-blocker.     Consultants:  Cardiology   Procedures: 02/14/22 angiogram LE 02/21/22 Left and Right Heart Catheterization w/ Dr End     Subjective: Patient reports overall doing okay, still has some concerns about the swelling in his legs.  He denies chest pain, shortness of breath.  He is talking a lot about concerns regarding lack of insurance, needing to take care of of his family members therefore he does not have employment.     ASSESSMENT & PLAN:   Principal Problem:   Acute on chronic combined systolic and diastolic CHF (congestive heart failure) (HCC) Active Problems:   Smoker   Type 2 diabetes mellitus with hyperglycemia (HCC)   PVD (peripheral vascular disease) (HCC)   Acute HFrEF (heart failure with reduced ejection fraction) (HCC)   Coronary artery disease    Acute on chronic systolic and diastolic congestive heart failure Anasarca Net IO Since Admission: -41,244 mL [02/24/22 0846] Plan: Continue Lasix 80 mg IV twice daily --> po tomorrow per cardiology  Continue Daily aldactone Daily renal function, creatinine has remained stable Continue midodrine 2.5 mg 3 times daily Unable to add GDMT at this time d/t low BP  Cardiac MRI 07/07: Severely reduced Bi-ventricular function.  LVEF 16%, RVEF 19%. Findings consistent with ischemic cardiomyopathy with viable LV lateral and inferior walls. Non-viable LV anterior and apical wall.  Per cardiology: DC metolazone, consider switching to oral furosemide tomorrow.     Coronary artery disease Per Dr End (cardio) 07/05: Cath showed severe three-vessel CAD that  appears chronic. Left and right heart pressures high (LVEDP 32 mmHg). Recommend few more days aggressive diuresis, was on metolazone in addition to his scheduled Lasix.  Based on MRI viability study,  no CABG revascularization   Hypotension BP remains soft Continue midodrine 2.5 3 times daily Hold GDMT   Elevated troponin Likely secondary demand ischemia No indication of ACS   Bilateral pleural effusions Likely secondary to decompensated heart failure Patient on room air, clear lungs   Type 2 diabetes mellitus Hemoglobin A1c 7.7 SSI with NovoLog Continue empagliflozin Consider metformin at discharge   Peripheral vascular disease Peripheral neuropathy Lower extremity angiogram reassuring Gabapentin not effective Seems to have been a result of Lyrica Plan: Continue Lyrica 100 3 times daily Continue Robaxin Avoid narcotics except breakthrough    Anxiety Seroquel as needed qhs for sleep/agitation as needed Xanax   Tobacco use disorder Counseled patient    DVT prophylaxis: lovenox Code Status: FULL Family Communication: spoke w/ mother on the phone 02/24/22 5:49 PM  Disposition Plan / TOC needs: likely back to home environment but may need HH/SNF pending PT/OT eval when medically improved  Barriers to discharge / significant pending items: continued IV diuresis and further cardiac workup pending per cardiology recommendations              Objective: Vitals:   02/23/22 2351 02/24/22 0452 02/24/22 0457 02/24/22 0827  BP: 97/73  96/70 94/64  Pulse: 93  87 82  Resp: '20  20 17  '$ Temp: 98 F (36.7 C)  97.9 F (36.6 C)   TempSrc:      SpO2: 98%  100% 98%  Weight:  104 kg    Height:        Intake/Output Summary (Last 24 hours) at 02/24/2022 0846 Last data filed at 02/24/2022 6160 Gross per 24 hour  Intake 1073 ml  Output 3655 ml  Net -2582 ml    Filed Weights   02/22/22 0247 02/23/22 0424 02/24/22 0452  Weight: 105.6 kg 104.2 kg 104 kg    Examination:  Constitutional:  VS as above General Appearance: alert, well-developed, well-nourished, NAD Ears, Nose, Mouth, Throat: Normal appearance Neck: No masses, trachea midline Respiratory: Normal  respiratory effort Breath sounds normal, no wheeze/rhonchi/rales Cardiovascular: S1/S2 normal, no murmur/rub/gallop auscultated Unable to assess lower extremity edema - both legs bandaged/wrapped  Gastrointestinal: Nontender, no masses Musculoskeletal:  No clubbing/cyanosis of digits LE wrapped bilateral Neurological: No cranial nerve deficit on limited exam Motor and sensation intact and symmetric Psychiatric: Normal judgment/insight Normal mood and affect       Scheduled Medications:   aspirin EC  81 mg Oral Daily   atorvastatin  80 mg Oral Daily   digoxin  0.125 mg Oral Daily   empagliflozin  10 mg Oral Daily   enoxaparin (LOVENOX) injection  0.5 mg/kg Subcutaneous Q24H   insulin aspart  0-15 Units Subcutaneous TID WC   insulin aspart  0-5 Units Subcutaneous QHS   insulin aspart  3 Units Subcutaneous TID WC   methocarbamol  750 mg Oral QID   midodrine  2.5 mg Oral TID WC   pantoprazole  40 mg Oral Daily   pregabalin  100 mg Oral TID   QUEtiapine  25 mg Oral QHS   sodium chloride flush  3 mL Intravenous Q12H   spironolactone  12.5 mg Oral Daily    Continuous Infusions:  sodium chloride      PRN Medications:  sodium chloride, acetaminophen, ALPRAZolam, bisacodyl, ondansetron (ZOFRAN) IV, ondansetron (ZOFRAN) IV,  oxyCODONE, sodium chloride flush  Antimicrobials:  Anti-infectives (From admission, onward)    Start     Dose/Rate Route Frequency Ordered Stop   02/14/22 1156  ceFAZolin (ANCEF) IVPB 1 g/50 mL premix        over 30 Minutes  Continuous PRN 02/14/22 1200 02/14/22 1328   02/14/22 1018  ceFAZolin (ANCEF) IVPB 2g/100 mL premix  Status:  Discontinued        2 g 200 mL/hr over 30 Minutes Intravenous 30 min pre-op 02/14/22 1018 02/14/22 1328   02/13/22 1816  ceFAZolin (ANCEF) IVPB 2g/100 mL premix  Status:  Discontinued        2 g 200 mL/hr over 30 Minutes Intravenous 30 min pre-op 02/13/22 1816 02/14/22 1023       Data Reviewed: I have personally  reviewed following labs and imaging studies  CBC: Recent Labs  Lab 02/22/22 0459 02/23/22 0410  WBC 7.3 7.3  HGB 13.1 13.3  HCT 40.5 40.8  MCV 94.8 94.0  PLT 367 650    Basic Metabolic Panel: Recent Labs  Lab 02/20/22 0603 02/21/22 0636 02/22/22 0459 02/23/22 0410 02/24/22 0614  NA 135 135 133* 135 132*  K 4.3 4.1 4.2 3.8 3.8  CL 95* 93* 92* 91* 84*  CO2 31 34* 32 35* 36*  GLUCOSE 186* 176* 186* 192* 205*  BUN 33* 32* 36* 50* 50*  CREATININE 1.04 1.04 1.23 1.23 1.47*  CALCIUM 9.1 9.1 9.1 9.3 9.5    GFR: Estimated Creatinine Clearance: 69.4 mL/min (A) (by C-G formula based on SCr of 1.47 mg/dL (H)). Liver Function Tests: No results for input(s): "AST", "ALT", "ALKPHOS", "BILITOT", "PROT", "ALBUMIN" in the last 168 hours. No results for input(s): "LIPASE", "AMYLASE" in the last 168 hours. No results for input(s): "AMMONIA" in the last 168 hours. Coagulation Profile: No results for input(s): "INR", "PROTIME" in the last 168 hours. Cardiac Enzymes: No results for input(s): "CKTOTAL", "CKMB", "CKMBINDEX", "TROPONINI" in the last 168 hours. BNP (last 3 results) No results for input(s): "PROBNP" in the last 8760 hours. HbA1C: No results for input(s): "HGBA1C" in the last 72 hours. CBG: Recent Labs  Lab 02/23/22 0734 02/23/22 1129 02/23/22 1657 02/23/22 2027 02/24/22 0828  GLUCAP 172* 258* 178* 181* 207*    Lipid Profile: Recent Labs    02/24/22 0614  CHOL 242*  HDL 35*  LDLCALC 160*  TRIG 235*  CHOLHDL 6.9   Thyroid Function Tests: No results for input(s): "TSH", "T4TOTAL", "FREET4", "T3FREE", "THYROIDAB" in the last 72 hours. Anemia Panel: No results for input(s): "VITAMINB12", "FOLATE", "FERRITIN", "TIBC", "IRON", "RETICCTPCT" in the last 72 hours. Urine analysis: No results found for: "COLORURINE", "APPEARANCEUR", "LABSPEC", "PHURINE", "GLUCOSEU", "HGBUR", "BILIRUBINUR", "KETONESUR", "PROTEINUR", "UROBILINOGEN", "NITRITE", "LEUKOCYTESUR" Sepsis  Labs: '@LABRCNTIP'$ (procalcitonin:4,lacticidven:4)  Recent Results (from the past 240 hour(s))  Aerobic Culture w Gram Stain (superficial specimen)     Status: Abnormal   Collection Time: 02/15/22  2:20 PM   Specimen: Wound  Result Value Ref Range Status   Specimen Description   Final    WOUND Performed at [facility], [address] [place], [place] [address]    Special Requests   Final    HS STER Performed at [facility], [address] [place], [place] [address]    Gram Stain   Final    NO SQUAMOUS EPITHELIAL CELLS SEEN NO WBC SEEN ABUNDANT GRAM NEGATIVE RODS ABUNDANT GRAM POSITIVE COCCI    Culture (A)  Final    MULTIPLE ORGANISMS PRESENT, NONE PREDOMINANT  NO STAPHYLOCOCCUS AUREUS ISOLATED NO GROUP A STREP (S.PYOGENES) ISOLATED Performed at [facility], [place] [address] [place], [place] [address]    Report Status 02/18/2022 FINAL  Final         Radiology Studies last 96 hours: MR CARDIAC MORPHOLOGY W WO CONTRAST  Result Date: 02/23/2022 CLINICAL DATA:  Ischemic cardiomyopathy, evaluate for viability. EXAM: CARDIAC MRI TECHNIQUE: The patient was scanned on a 1.5 Tesla Siemens magnet. A dedicated cardiac coil was used. Functional imaging was done using Fiesta sequences. 2,3, and 4 chamber views were done to assess for RWMA's. Modified Simpson's rule using a short axis stack was used to calculate an ejection fraction on a dedicated work Conservation officer, nature. The patient received 10 cc of Gadavist. After 10 minutes inversion recovery sequences were used to assess for infiltration and scar tissue. CONTRAST:  10 cc  of Gadavist FINDINGS: 1. Mildly dilated left ventricular size, severely reduced LV systolic function (LVEF = 16%). There is global hypokinesis with anterior LV wall akinesis. The entire LV anterior wall appears thinned. There is transmural subendocardial late gadolinium enhancement in the entire anterior wall and apex of the left  ventricular myocardium. There is 25-50% subendocardial LGE/scar in the LV lateral wall. LVEDV: 265 ml LVESV: 223 ml SV: 42 ml CO: 3.6 L/min Myocardial mass: 138 g 2. Normal right ventricular size, thickness, severely reduced systolic function (RVEF = 19%). There is global hypokinesis. 3.  Normal left and right atrial size. 4. Normal size of the aortic root, ascending aorta and pulmonary artery. 5.  No significant valvular abnormalities. 6.  Normal pericardium.  No pericardial effusion. IMPRESSION: 1.  Severely reduced Bi-ventricular function.  LVEF 16%, RVEF 19%. 2. There is transmural subendocardial scar in the LV anterior wall and apex (LAD territory), indicating non viability. 3.  There is 25-50% subendocardial LGE/scar in the LV lateral wall. 4. The LV lateral and inferior walls appear viable (RCA and LCx territory). 5.  No significant valvular abnormalities. 6. Findings consistent with ischemic cardiomyopathy with viable LV lateral and inferior walls. Non-viable LV anterior and apical wall. Electronically Signed   By: Kate Sable M.D.   On: 02/23/2022 16:23   CARDIAC CATHETERIZATION  Result Date: 02/21/2022 Conclusions: Severe three-vessel coronary artery disease, including chronic total/subtotal occlusions of large D1 and OM1 branches, mid LAD, and ostial rPDA.  There is also moderate-severe disease involving the proximal and distal LCx as well as the distal RCA. Moderately elevated left heart, right heart, and pulmonary artery pressures (LVEDP 32 mmHg, PCWP 30 mmHg, mean RA 9 mmHg, RVEDP 14 mmHg, and mean PAP 37 mmHg). Low normal Fick cardiac output/index (CO 5.7 L/min, CI 2.5 L/min/m^2). Recommendations: Escalate diuresis, as filling pressures indicate persistent volume overload.  I will add metolazone 2.5 mg daily to current regimen of furosemide 80 mg IV twice daily. Escalate goal-directed medical therapy for acute HFrEF due to ischemic cardiomyopathy as blood pressure and renal function allow.  Once optimized from a volume standpoint, recommend cardiac MRI to assess for viability to help guide revascularization strategy.  If LAD +/- LCx territory is viable, evaluation cardiac surgery consultation will need to be considered. Nelva Bush, MD [facility]           LOS: 19 days     Emeterio Reeve, DO [facility] 02/24/2022, 8:46 AM   Staff may message me via secure chat in [place]  but this may not receive immediate response,  please page for urgent matters!  If  7PM-7AM, please contact night-coverage [email or web address]  Dictation software was used to generate the above note. Typos may occur and escape review, as with typed/written notes. Please contact Dr Sheppard Coil directly for clarity if needed.

## 2022-02-25 ENCOUNTER — Inpatient Hospital Stay: Payer: Medicaid Other

## 2022-02-25 DIAGNOSIS — I5021 Acute systolic (congestive) heart failure: Secondary | ICD-10-CM | POA: Diagnosis not present

## 2022-02-25 DIAGNOSIS — I5043 Acute on chronic combined systolic (congestive) and diastolic (congestive) heart failure: Secondary | ICD-10-CM | POA: Diagnosis not present

## 2022-02-25 DIAGNOSIS — I2510 Atherosclerotic heart disease of native coronary artery without angina pectoris: Secondary | ICD-10-CM | POA: Diagnosis not present

## 2022-02-25 LAB — GLUCOSE, CAPILLARY
Glucose-Capillary: 182 mg/dL — ABNORMAL HIGH (ref 70–99)
Glucose-Capillary: 203 mg/dL — ABNORMAL HIGH (ref 70–99)
Glucose-Capillary: 169 mg/dL — ABNORMAL HIGH (ref 70–99)
Glucose-Capillary: 183 mg/dL — ABNORMAL HIGH (ref 70–99)

## 2022-02-25 LAB — BASIC METABOLIC PANEL
Anion gap: 10 (ref 5–15)
BUN: 45 mg/dL — ABNORMAL HIGH (ref 6–20)
CO2: 34 mmol/L — ABNORMAL HIGH (ref 22–32)
Calcium: 9.4 mg/dL (ref 8.9–10.3)
Chloride: 88 mmol/L — ABNORMAL LOW (ref 98–111)
Creatinine, Ser: 1.17 mg/dL (ref 0.61–1.24)
GFR, Estimated: >60 mL/min (ref >60)
Glucose, Bld: 220 mg/dL — ABNORMAL HIGH (ref 70–99)
Potassium: 3.9 mmol/L (ref 3.5–5.1)
Sodium: 132 mmol/L — ABNORMAL LOW (ref 135–145)

## 2022-02-25 MED ORDER — MIDODRINE HCL 5 MG PO TABS
2.5000 mg | ORAL_TABLET | Freq: Two times a day (BID) | ORAL | Status: DC
Start: 2022-02-25 — End: 2022-02-28
  Administered 2022-02-25 – 2022-02-27 (×5): 2.5 mg via ORAL
  Filled 2022-02-25 (×5): qty 1

## 2022-02-25 MED ORDER — TORSEMIDE 20 MG PO TABS
20.0000 mg | ORAL_TABLET | Freq: Every day | ORAL | Status: DC
  Administered 2022-02-25 – 2022-02-26 (×2): 20 mg via ORAL
  Filled 2022-02-25 (×2): qty 1

## 2022-02-25 MED ORDER — POTASSIUM CHLORIDE CRYS ER 20 MEQ PO TBCR
20.0000 meq | EXTENDED_RELEASE_TABLET | Freq: Every day | ORAL | Status: DC
  Administered 2022-02-25 – 2022-03-01 (×5): 20 meq via ORAL
  Filled 2022-02-25 (×5): qty 1

## 2022-02-25 NOTE — Progress Notes (Signed)
Cardiology Progress Note   Patient Name: Lawrence Murray Date of Encounter: 02/25/2022  Primary Cardiologist: Kate Sable, MD  Subjective   Breathing is OK   Denies CP   Legs still hurt when he elevates  Inpatient Medications    Scheduled Meds:  aspirin EC  81 mg Oral Daily   atorvastatin  80 mg Oral Daily   digoxin  0.125 mg Oral Daily   empagliflozin  10 mg Oral Daily   enoxaparin (LOVENOX) injection  0.5 mg/kg Subcutaneous Q24H   insulin aspart  0-15 Units Subcutaneous TID WC   insulin aspart  0-5 Units Subcutaneous QHS   insulin aspart  3 Units Subcutaneous TID WC   methocarbamol  750 mg Oral QID   midodrine  2.5 mg Oral TID WC   pantoprazole  40 mg Oral Daily   pregabalin  100 mg Oral TID   QUEtiapine  25 mg Oral QHS   sodium chloride flush  3 mL Intravenous Q12H   spironolactone  12.5 mg Oral Daily   Continuous Infusions:  sodium chloride     PRN Meds: sodium chloride, acetaminophen, ALPRAZolam, bisacodyl, ondansetron (ZOFRAN) IV, ondansetron (ZOFRAN) IV, oxyCODONE, sodium chloride flush   Vital Signs    Vitals:   02/24/22 2023 02/24/22 2312 02/25/22 0516 02/25/22 0822  BP: 99/62 100/64 99/66 100/66  Pulse: 88 89 79 81  Resp: '18 18 18 17  '$ Temp: 98.3 F (36.8 C) 98.5 F (36.9 C) 97.8 F (36.6 C)   TempSrc:  Oral    SpO2: 97% 95% 98% 97%  Weight:      Height:        Intake/Output Summary (Last 24 hours) at 02/25/2022 0852 Last data filed at 02/25/2022 0523 Gross per 24 hour  Intake 240 ml  Output 1850 ml  Net -1610 ml   Filed Weights   02/22/22 0247 02/23/22 0424 02/24/22 0452  Weight: 105.6 kg 104.2 kg 104 kg    Physical Exam   GEN: Well nourished, well developed, in no acute distress. Examined in chair   HEENT: Grossly normal.  Neck:  no JVD Cardiac: RRR, no murmurs, rubs, or gallops. No clubbing, cyanosis, 1-2+ bilateral lower extremity edema above his lower leg wraps.   Respiratory:  Respirations regular and unlabored, clear to  auscultation bilaterally. GI: Obese, soft, nontender, nondistended, BS + x 4. MS: no deformity or atrophy. Neuro:  Strength and sensation are intact. Psych: AAOx3.  Normal affect.  Labs    Chemistry Recent Labs  Lab 02/23/22 0410 02/24/22 0614 02/25/22 0556  NA 135 132* 132*  K 3.8 3.8 3.9  CL 91* 84* 88*  CO2 35* 36* 34*  GLUCOSE 192* 205* 220*  BUN 50* 50* 45*  CREATININE 1.23 1.47* 1.17  CALCIUM 9.3 9.5 9.4  GFRNONAA >60 56* >60  ANIONGAP '9 12 10     '$ Hematology Recent Labs  Lab 02/22/22 0459 02/23/22 0410  WBC 7.3 7.3  RBC 4.27 4.34  HGB 13.1 13.3  HCT 40.5 40.8  MCV 94.8 94.0  MCH 30.7 30.6  MCHC 32.3 32.6  RDW 15.9* 15.3  PLT 367 368    Cardiac Enzymes  Recent Labs  Lab 02/05/22 0218 02/05/22 0403  TROPONINIHS 370* 367*      BNP    Component Value Date/Time   BNP 919.6 (H) 02/05/2022 0218   Lipids  Lab Results  Component Value Date   CHOL 242 (H) 02/24/2022   HDL 35 (L) 02/24/2022   LDLCALC 160 (H)  02/24/2022   TRIG 235 (H) 02/24/2022   CHOLHDL 6.9 02/24/2022    HbA1c  Lab Results  Component Value Date   HGBA1C 7.7 (H) 02/05/2022    Radiology    --------  Telemetry    SR with occasional PVCs- Personally Reviewed  Cardiac Studies   2D Echocardiogram 6.20.2023   1. Left ventricular ejection fraction, by estimation, is 30 to 35%. The  left ventricle has moderately decreased function. The left ventricle  demonstrates global hypokinesis. Left ventricular diastolic function could  not be evaluated.   2. RV not well visualized but likely enlarged.   3. Large pleural effusion.   4. The mitral valve is normal in structure. Mild mitral valve  regurgitation.   5. The aortic valve is normal in structure. Aortic valve regurgitation is  not visualized.  _____________    Peripheral Angiography 6.28.2023   Findings:               Aortogram:  This demonstrated normal renal arteries and normal aorta and iliac segments without  significant stenosis.             Left Lower Extremity: This demonstrated normal common femoral artery, profunda femoris artery was small but patent.  The SFA and popliteal arteries were widely patent.  There was a normal tibial trifurcation and three-vessel runoff distally with the posterior tibial artery being the dominant flow into the foot             Right Lower Extremity:  Imaging demonstrated normal common femoral artery, small but patent profunda femoris artery, SFA and popliteal arteries are widely patent.  The posterior tibial artery was again the dominant runoff but there were no focal stenosis in all 3 tibial vessels.  _____________    Cardiac Catheterization  7.5.2023                                  Left Main            Vessel is large. The vessel exhibits minimal luminal irregularities.            Left Anterior Descending                Vessel is large.                Prox LAD to Mid LAD lesion is 80% stenosed.                Mid LAD-1 lesion is 99% stenosed.                Mid LAD-2 lesion is 50% stenosed.                First Diagonal Branch    Vessel is large in size.    1st Diag lesion is 100% stenosed. The lesion is chronically occluded.    Ramus Intermedius  Vessel is small. There is moderate diffuse disease throughout the vessel.  Left Circumflex                    Vessel is large.                    Prox Cx lesion is 60% stenosed.                    Mid Cx to Dist Cx lesion is 60% stenosed.  First Obtuse Marginal Branch      Vessel is large in size.      Collaterals      1st Mrg filled by collaterals from 3rd RPL.             1st Mrg lesion is 100% stenosed. The lesion is chronically occluded.      Lateral First Obtuse Marginal Branch                  Collaterals                  Lat 1st Mrg filled by collaterals from 3rd RPL.                                     Second Obtuse Marginal Branch              Vessel is small in size. There is  severe disease in the vessel.              Third Obtuse Marginal Branch                              Vessel is small in size.                              Right Coronary Artery        Vessel is large. There is mild diffuse disease throughout the vessel.        Prox RCA lesion is 40% stenosed.        Dist RCA lesion is 70% stenosed.        Right Posterior Descending Artery          Vessel is small in size.          Collaterals          RPDA filled by collaterals from 1st Sept.                     Collaterals          RPDA filled by collaterals from 2nd Sept.                     RPDA lesion is 100% stenosed. The lesion is chronically occluded.          Right Posterior Atrioventricular Artery                      Vessel is moderate in size.                      First Right Posterolateral Branch                            Vessel is small in size.                            Second Right Posterolateral Branch                        Vessel is small in size.                        Third  Right Posterolateral Branch                          Vessel is moderate in size.                            Right Heart Pressures RA (mean): 9 mmHg RV (S/EDP): 60/14 mmHg PA (S/D, mean): 60/25 (37) mmHg (diastolic pressure may be underestimated to due artifact on tracing) PCWP (mean): 30 mmHg with prominent V-waves up to 45 mmHg  Ao sat: 93% PA sat: 64%  Fick CO: 5.7 L/min Fick CI: 2.5 L/min/m^2  _____________   Cardiac MRI 7.7.2023  IMPRESSION: 1.  Severely reduced Bi-ventricular function.  LVEF 16%, RVEF 19%. 2. There is transmural subendocardial scar in the LV anterior wall and apex (LAD territory), indicating non viability. 3.  There is 25-50% subendocardial LGE/scar in the LV lateral wall. 4. The LV lateral and inferior walls appear viable (RCA and LCx territory). 5.  No significant valvular abnormalities. 6. Findings consistent with ischemic cardiomyopathy with viable LV lateral and  inferior walls. Non-viable LV anterior and apical wall.  Patient Profile     56 y.o. male with a history of multivessel CAD, HFrEF, obesity, smoker x20+ years presenting with shortness of breath and edema, being seen for acute HFrEF and multivessel CAD.  Assessment & Plan    1.  Acute HFrEF/ischemic cardiomyopathy: EF 3035% by echo this admission.  Diagnostic catheterization with multivessel CAD (see below).  Cardiac MRI with severe biventricular dysfunction with an LVEF of 16% and RVEF of 19%.  He has transmural subendocardial scar in the LV anterior wall and apex indicating nonviability.  In this setting, currently planning medical therapy for residual coronary disease.    Pt has diuresed well since admit  with marked drop in wt    He still have volume increase  lasix held yesterday with bump in BUN/Cr and bicarb of 36   From heart failure standpoint, he has been diuresing well and was -3 L again yesterday and -41 L since admission.  Weight has been stable at 104 kg, down from 145.2 kg on admission.  He still has 2+ edema of his lower legs above the areas that are wrapped however, BUN and creatinine have risen to 50/1.47 with a bicarb of 36.  WIll transition to 20 mg Torsemide today with 10 KCL to continue diuresis   May not work well.   He is on Jardiance and digoxin.   On midodrine so not able to use other meds  Follow blood pressure and urine output and wts  NOTE:  Pt has no insurance      2.  Multivessel CAD/non-STEMI: Chronic total occlusion of the first diagonal, OM, and RPDA, with severe, subtotal occlusive disease in the mid LAD.  He has not had any chest pain and dyspnea has improved diuresis.  Cardiac MRI  showed no viability in the LAD territory with good viability in the circumflex and RCA territories.  Catheterization films by Dr. Fletcher Anon on July 7.  Plan for medical therapy.  Continue aspirin and statin therapy.  He is not currently on beta-blocker in the setting of relative hypotension  requiring midodrine.  3.  Type 2 diabetes mellitus: Newly diagnosed.  A1c 7.7.  On sign scale insulin and Jardiance.  Per internal medicine.  Will need transition of care assistance for Jardiance.  Reviewed diet with him yestderday  Low sugar    low carb  Low salt    4.  Tobacco use: Cessation advised again  5.  Hyperlipidemia: LDL of 160 with a total cholesterol of 242.  Statin therapy was added on July 7..  Plan to follow-up as an outpatient.  6.  Relative hypotension: Blood pressures continue to trend in the 90s on low-dose midodrine therapy. Will taper down to 2x per day  7.  Lower extremity wounds: Legs are currently wrapped.  Will likely need ongoing wound care in the outpatient setting at home health if financially feasible.  Signed, Dorris Carnes, MD  02/25/2022, 8:52 AM    For questions or updates, please contact   Please consult [email or web address] for contact info under Cardiology/STEMI.

## 2022-02-25 NOTE — Plan of Care (Signed)

## 2022-02-25 NOTE — Progress Notes (Signed)
PROGRESS NOTE    Lawrence Murray  RWE:[id number] DOB: [birth year]  DOA: 02/05/2022 Date of Service: 02/25/22 PCP: Kathyrn Lass     Hospital Course:  56 y.o. male who has not seen a doctor in many years, presented 02/05/22 to the hospital because of shortness of breath and significant swelling in the lower extremities. acute on chronic systolic and diastolic CHF with anasarca.  2D echo showed estimated EF of 30 to 61% but LV diastolic function could not be evaluated.  He was treated with IV Lasix.  BP was soft so midodrine was started.  Troponin was elevated but this was attributed to demand ischemia.  Cardiologist recommended left and right heart cath once he is euvolemic. There was concern for peripheral arterial disease.  Patient underwent angiogram with vascular surgery on 6/28.  Good arterial flow noted.  No intervention was indicated.  Volume status is markedly improved.  As of 6/30 patient is 25.5 L net negative.  Symptomatically improved. 7/3-7/4: patient tolerating diuretics well.  7/5: L/R Cardiac Cath (+)severe three-vessel CAD appears chronic. Left and right heart pressures high (LVEDP 32 mmHg). Cardio recommend few more days aggressive diuresis, added metolazone to his scheduled Lasix. Planning MRI viability study  7/6: planning MR cardiac morphology study tomorrow.  7/7: Net IO Since Admission: -39,572 mL [02/23/22 1737]. Cardiac MRI: Severely reduced Bi-ventricular function.  LVEF 16%, RVEF 19%. Findings consistent with ischemic cardiomyopathy with viable LV lateral and inferior walls. Non-viable LV anterior and apical wall. Per cardiology: DC metolazone, consider switching to oral furosemide over the weekend.  Given MRI findings, minimal benefit from CABG, recommending aggressive medical therapy and no revascularization. 7/8: Continues to diurese, Net IO Since Admission: -41,244 mL [02/24/22 0846]. Slight increase Cr to 1.5 from 1.2 past few days. If renal function stable, would likely  transition to oral torsemide +/-metolazone, goal with transitioning to an oral diuretic may be able to wean off midodrine and add a low-dose ARB and/or beta-blocker pending BP  7/9: awaiting caridology recs: transition to torsemide 20 mg, continue midodrine    Consultants:  Cardiology   Procedures: 02/14/22 angiogram LE 02/21/22 Left and Right Heart Catheterization w/ Dr End     Subjective: Patient reports overall doing okay, no complaints today     ASSESSMENT & PLAN:   Principal Problem:   Acute on chronic combined systolic and diastolic CHF (congestive heart failure) (HCC) Active Problems:   Smoker   Type 2 diabetes mellitus with hyperglycemia (HCC)   PVD (peripheral vascular disease) (HCC)   Acute HFrEF (heart failure with reduced ejection fraction) (HCC)   Coronary artery disease    Acute on chronic systolic and diastolic congestive heart failure Anasarca Net IO Since Admission: -42,854 mL [02/25/22 0844] Plan: Lasix 80 mg IV twice daily --> po torsemide 02/25/22 today  Continue Daily aldactone Daily renal function, creatinine has remained stable Continue midodrine 2.5 mg 3 times daily Unable to add GDMT at this time d/t low BP  Cardiac MRI 07/07: Severely reduced Bi-ventricular function.  LVEF 16%, RVEF 19%. Findings consistent with ischemic cardiomyopathy with viable LV lateral and inferior walls. Non-viable LV anterior and apical wall.    Coronary artery disease Per Dr End (cardio) 07/05: Cath showed severe three-vessel CAD that appears chronic. Left and right heart pressures high (LVEDP 32 mmHg). Recommend few more days aggressive diuresis, was on metolazone in addition to his scheduled Lasix.  Based on MRI viability study, no CABG revascularization   Hypotension BP remains soft Continue midodrine 2.5  3 times daily Hold GDMT   Elevated troponin Likely secondary demand ischemia No indication of ACS   Bilateral pleural effusions Likely secondary to  decompensated heart failure Patient on room air, clear lungs   Type 2 diabetes mellitus Hemoglobin A1c 7.7 SSI with NovoLog Continue empagliflozin Consider metformin at discharge   Peripheral vascular disease Peripheral neuropathy Lower extremity angiogram reassuring Gabapentin not effective Seems to have been a result of Lyrica Plan: Continue Lyrica 100 3 times daily Continue Robaxin Avoid narcotics except breakthrough    Anxiety Seroquel as needed qhs for sleep/agitation as needed Xanax   Tobacco use disorder Counseled patient    DVT prophylaxis: lovenox Code Status: FULL Family Communication: spoke w/ mother on the phone yesterday, pt  declines call to her today  Disposition Plan / TOC needs: likely back to home environment but may need HH/SNF pending PT/OT eval when medically improved  Barriers to discharge / significant pending items: continued IV diuresis and further cardiac workup pending per cardiology recommendations              Objective: Vitals:   02/24/22 2023 02/24/22 2312 02/25/22 0516 02/25/22 0822  BP: 99/62 100/64 99/66 100/66  Pulse: 88 89 79 81  Resp: '18 18 18 17  '$ Temp: 98.3 F (36.8 C) 98.5 F (36.9 C) 97.8 F (36.6 C)   TempSrc:  Oral    SpO2: 97% 95% 98% 97%  Weight:      Height:        Intake/Output Summary (Last 24 hours) at 02/25/2022 0844 Last data filed at 02/25/2022 9242 Gross per 24 hour  Intake 240 ml  Output 1850 ml  Net -1610 ml    Filed Weights   02/22/22 0247 02/23/22 0424 02/24/22 0452  Weight: 105.6 kg 104.2 kg 104 kg    Examination:  Constitutional:  VS as above General Appearance: alert, well-developed, well-nourished, NAD Ears, Nose, Mouth, Throat: Normal appearance Neck: No masses, trachea midline Respiratory: Normal respiratory effort Breath sounds normal, no wheeze/rhonchi/rales Cardiovascular: S1/S2 normal, no murmur/rub/gallop auscultated Unable to assess lower extremity edema - both legs  bandaged/wrapped  Gastrointestinal: Nontender, no masses Musculoskeletal:  No clubbing/cyanosis of digits LE wrapped bilateral 2+ pitting edema where skin able to be examined  Neurological: No cranial nerve deficit on limited exam Psychiatric: Normal judgment/insight Normal mood and affect       Scheduled Medications:   aspirin EC  81 mg Oral Daily   atorvastatin  80 mg Oral Daily   digoxin  0.125 mg Oral Daily   empagliflozin  10 mg Oral Daily   enoxaparin (LOVENOX) injection  0.5 mg/kg Subcutaneous Q24H   insulin aspart  0-15 Units Subcutaneous TID WC   insulin aspart  0-5 Units Subcutaneous QHS   insulin aspart  3 Units Subcutaneous TID WC   methocarbamol  750 mg Oral QID   midodrine  2.5 mg Oral TID WC   pantoprazole  40 mg Oral Daily   pregabalin  100 mg Oral TID   QUEtiapine  25 mg Oral QHS   sodium chloride flush  3 mL Intravenous Q12H   spironolactone  12.5 mg Oral Daily    Continuous Infusions:  sodium chloride      PRN Medications:  sodium chloride, acetaminophen, ALPRAZolam, bisacodyl, ondansetron (ZOFRAN) IV, ondansetron (ZOFRAN) IV, oxyCODONE, sodium chloride flush  Antimicrobials:  Anti-infectives (From admission, onward)    Start     Dose/Rate Route Frequency Ordered Stop   02/14/22 1156  ceFAZolin (ANCEF) IVPB 1  g/50 mL premix        over 30 Minutes  Continuous PRN 02/14/22 1200 02/14/22 1328   02/14/22 1018  ceFAZolin (ANCEF) IVPB 2g/100 mL premix  Status:  Discontinued        2 g 200 mL/hr over 30 Minutes Intravenous 30 min pre-op 02/14/22 1018 02/14/22 1328   02/13/22 1816  ceFAZolin (ANCEF) IVPB 2g/100 mL premix  Status:  Discontinued        2 g 200 mL/hr over 30 Minutes Intravenous 30 min pre-op 02/13/22 1816 02/14/22 1023       Data Reviewed: I have personally reviewed following labs and imaging studies  CBC: Recent Labs  Lab 02/22/22 0459 02/23/22 0410  WBC 7.3 7.3  HGB 13.1 13.3  HCT 40.5 40.8  MCV 94.8 94.0  PLT 367 368     Basic Metabolic Panel: Recent Labs  Lab 02/21/22 0636 02/22/22 0459 02/23/22 0410 02/24/22 0614 02/25/22 0556  NA 135 133* 135 132* 132*  K 4.1 4.2 3.8 3.8 3.9  CL 93* 92* 91* 84* 88*  CO2 34* 32 35* 36* 34*  GLUCOSE 176* 186* 192* 205* 220*  BUN 32* 36* 50* 50* 45*  CREATININE 1.04 1.23 1.23 1.47* 1.17  CALCIUM 9.1 9.1 9.3 9.5 9.4    GFR: Estimated Creatinine Clearance: 87.2 mL/min (by C-G formula based on SCr of 1.17 mg/dL). Liver Function Tests: No results for input(s): "AST", "ALT", "ALKPHOS", "BILITOT", "PROT", "ALBUMIN" in the last 168 hours. No results for input(s): "LIPASE", "AMYLASE" in the last 168 hours. No results for input(s): "AMMONIA" in the last 168 hours. Coagulation Profile: No results for input(s): "INR", "PROTIME" in the last 168 hours. Cardiac Enzymes: No results for input(s): "CKTOTAL", "CKMB", "CKMBINDEX", "TROPONINI" in the last 168 hours. BNP (last 3 results) No results for input(s): "PROBNP" in the last 8760 hours. HbA1C: No results for input(s): "HGBA1C" in the last 72 hours. CBG: Recent Labs  Lab 02/24/22 0828 02/24/22 1146 02/24/22 1632 02/24/22 2151 02/25/22 0820  GLUCAP 207* 195* 191* 171* 182*    Lipid Profile: Recent Labs    02/24/22 0614  CHOL 242*  HDL 35*  LDLCALC 160*  TRIG 235*  CHOLHDL 6.9    Thyroid Function Tests: No results for input(s): "TSH", "T4TOTAL", "FREET4", "T3FREE", "THYROIDAB" in the last 72 hours. Anemia Panel: No results for input(s): "VITAMINB12", "FOLATE", "FERRITIN", "TIBC", "IRON", "RETICCTPCT" in the last 72 hours. Urine analysis: No results found for: "COLORURINE", "APPEARANCEUR", "LABSPEC", "PHURINE", "GLUCOSEU", "HGBUR", "BILIRUBINUR", "KETONESUR", "PROTEINUR", "UROBILINOGEN", "NITRITE", "LEUKOCYTESUR" Sepsis Labs: '@[email or web address]'$ (procalcitonin:4,lacticidven:4)  Recent Results (from the past 240 hour(s))  Aerobic Culture w Gram Stain (superficial specimen)     Status: Abnormal   Collection  Time: 02/15/22  2:20 PM   Specimen: Wound  Result Value Ref Range Status   Specimen Description   Final    WOUND Performed at [facility], [address], [place], [place] [address]    Special Requests   Final    HS STER Performed at [facility], [address] [place], [place] [address]    Gram Stain   Final    NO SQUAMOUS EPITHELIAL CELLS SEEN NO WBC SEEN ABUNDANT GRAM NEGATIVE RODS ABUNDANT GRAM POSITIVE COCCI    Culture (A)  Final    MULTIPLE ORGANISMS PRESENT, NONE PREDOMINANT NO STAPHYLOCOCCUS AUREUS ISOLATED NO GROUP A STREP (S.PYOGENES) ISOLATED Performed at [facility], [place] [address] [place], [place] [address]    Report Status 02/18/2022 FINAL  Final  Radiology Studies last 96 hours: MR CARDIAC MORPHOLOGY W WO CONTRAST  Result Date: 02/23/2022 CLINICAL DATA:  Ischemic cardiomyopathy, evaluate for viability. EXAM: CARDIAC MRI TECHNIQUE: The patient was scanned on a 1.5 Tesla Siemens magnet. A dedicated cardiac coil was used. Functional imaging was done using Fiesta sequences. 2,3, and 4 chamber views were done to assess for RWMA's. Modified Simpson's rule using a short axis stack was used to calculate an ejection fraction on a dedicated work Conservation officer, nature. The patient received 10 cc of Gadavist. After 10 minutes inversion recovery sequences were used to assess for infiltration and scar tissue. CONTRAST:  10 cc  of Gadavist FINDINGS: 1. Mildly dilated left ventricular size, severely reduced LV systolic function (LVEF = 16%). There is global hypokinesis with anterior LV wall akinesis. The entire LV anterior wall appears thinned. There is transmural subendocardial late gadolinium enhancement in the entire anterior wall and apex of the left ventricular myocardium. There is 25-50% subendocardial LGE/scar in the LV lateral wall. LVEDV: 265 ml LVESV: 223 ml SV: 42 ml CO: 3.6 L/min Myocardial mass: 138 g 2. Normal right  ventricular size, thickness, severely reduced systolic function (RVEF = 19%). There is global hypokinesis. 3.  Normal left and right atrial size. 4. Normal size of the aortic root, ascending aorta and pulmonary artery. 5.  No significant valvular abnormalities. 6.  Normal pericardium.  No pericardial effusion. IMPRESSION: 1.  Severely reduced Bi-ventricular function.  LVEF 16%, RVEF 19%. 2. There is transmural subendocardial scar in the LV anterior wall and apex (LAD territory), indicating non viability. 3.  There is 25-50% subendocardial LGE/scar in the LV lateral wall. 4. The LV lateral and inferior walls appear viable (RCA and LCx territory). 5.  No significant valvular abnormalities. 6. Findings consistent with ischemic cardiomyopathy with viable LV lateral and inferior walls. Non-viable LV anterior and apical wall. Electronically Signed   By: Kate Sable M.D.   On: 02/23/2022 16:23            LOS: 20 days     Emeterio Reeve, DO [facility] 02/25/2022, 8:44 AM   Staff may message me via secure chat in [place]  but this may not receive immediate response,  please page for urgent matters!  If 7PM-7AM, please contact night-coverage [email or web address]  Dictation software was used to generate the above note. Typos may occur and escape review, as with typed/written notes. Please contact Dr Sheppard Coil directly for clarity if needed.

## 2022-02-25 NOTE — Plan of Care (Signed)

## 2022-02-26 DIAGNOSIS — I2510 Atherosclerotic heart disease of native coronary artery without angina pectoris: Secondary | ICD-10-CM | POA: Diagnosis not present

## 2022-02-26 DIAGNOSIS — I5043 Acute on chronic combined systolic (congestive) and diastolic (congestive) heart failure: Secondary | ICD-10-CM | POA: Diagnosis not present

## 2022-02-26 DIAGNOSIS — I5021 Acute systolic (congestive) heart failure: Secondary | ICD-10-CM | POA: Diagnosis not present

## 2022-02-26 LAB — BASIC METABOLIC PANEL
Anion gap: 10 (ref 5–15)
BUN: 47 mg/dL — ABNORMAL HIGH (ref 6–20)
CO2: 34 mmol/L — ABNORMAL HIGH (ref 22–32)
Calcium: 9.6 mg/dL (ref 8.9–10.3)
Chloride: 90 mmol/L — ABNORMAL LOW (ref 98–111)
Creatinine, Ser: 1.35 mg/dL — ABNORMAL HIGH (ref 0.61–1.24)
GFR, Estimated: >60 mL/min (ref >60)
Glucose, Bld: 195 mg/dL — ABNORMAL HIGH (ref 70–99)
Potassium: 4.2 mmol/L (ref 3.5–5.1)
Sodium: 134 mmol/L — ABNORMAL LOW (ref 135–145)

## 2022-02-26 LAB — DIGOXIN LEVEL: Digoxin Level: 0.6 ng/mL — ABNORMAL LOW (ref 0.8–2.0)

## 2022-02-26 LAB — GLUCOSE, CAPILLARY
Glucose-Capillary: 176 mg/dL — ABNORMAL HIGH (ref 70–99)
Glucose-Capillary: 194 mg/dL — ABNORMAL HIGH (ref 70–99)
Glucose-Capillary: 144 mg/dL — ABNORMAL HIGH (ref 70–99)
Glucose-Capillary: 222 mg/dL — ABNORMAL HIGH (ref 70–99)

## 2022-02-26 NOTE — TOC Initial Note (Addendum)
Transition of Care [facility]) - Initial/Assessment Note    Patient Details  Name: Lawrence Murray MRN: [id number] Date of Birth: 02-05-1966  Transition of Care [facility]) CM/SW Contact:    Alberteen Sam, LCSW Phone Number: 02/26/2022, 2:54 PM  Clinical Narrative:                  CSW met with patient at bedside following MD consult for financial concerns.   Per patient, he has applied for medicaid and received some information in the mail from medicaid that he has questions about.  CSW informed patient that financial counselors would best be able to look up his medicaid application and answer financial questions.  Referral given to [facility] with financial counseling to follow up with patient.   Per Wilburn Cornelia with [employer] she visited patient's room today and talked through everything this morning and gave him her number, if he has any more Medicaid questions or misplaces my number, he can call her office phone at [phone]   Expected Discharge Plan: Home/Self Care Barriers to Discharge: Continued Medical Work up   Patient Goals and CMS Choice Patient states their goals for this hospitalization and ongoing recovery are:: to go home CMS Medicare.gov Compare Post Acute Care list provided to:: Patient Choice offered to / list presented to : Patient  Expected Discharge Plan and Services Expected Discharge Plan: Home/Self Care       Living arrangements for the past 2 months: Single Family Home                                      Prior Living Arrangements/Services Living arrangements for the past 2 months: Single Family Home Lives with:: Relatives                   Activities of Daily Living Home Assistive Devices/Equipment: None ADL Screening (condition at time of admission) Patient's cognitive ability adequate to safely complete daily activities?: No Is the patient deaf or have difficulty hearing?: No Does the patient have difficulty seeing, even when wearing  glasses/contacts?: No Does the patient have difficulty concentrating, remembering, or making decisions?: No Patient able to express need for assistance with ADLs?: Yes Does the patient have difficulty dressing or bathing?: Yes Independently performs ADLs?: No Dressing (OT): Dependent Grooming: Dependent Feeding: Independent Bathing: Dependent Toileting: Dependent In/Out Bed: Dependent Walks in Home: Dependent Does the patient have difficulty walking or climbing stairs?: Yes Weakness of Legs: Both Weakness of Arms/Hands: Both  Permission Sought/Granted                  Emotional Assessment       Orientation: : Oriented to Self, Oriented to Place, Oriented to  Time, Oriented to Situation Alcohol / Substance Use: Not Applicable Psych Involvement: No (comment)  Admission diagnosis:  Pain [R52] Hyperglycemia [R73.9] Demand ischemia (HCC) [I24.8] Acute CHF (congestive heart failure) ([place]) [I50.9] Atrial flutter, unspecified type ([place]) [I48.92] New onset of congestive heart failure ([place]) [I50.9] Patient Active Problem List   Diagnosis Date Noted   Coronary artery disease 02/21/2022   Acute HFrEF (heart failure with reduced ejection fraction) (HCC)    PVD (peripheral vascular disease) ([place]) 02/12/2022   Type 2 diabetes mellitus with hyperglycemia ([place]) 02/07/2022   Smoker    Acute on chronic combined systolic and diastolic CHF (congestive heart failure) ([place]) 02/05/2022   PCP:  Pcp, No Pharmacy:  [place] [address] Phone: [phone] Fax: [phone]     Social Determinants of Health (SDOH) Interventions    Readmission Risk Interventions     No data to display

## 2022-02-26 NOTE — Progress Notes (Signed)
Progress Note  Patient Name: Lawrence Murray Date of Encounter: 02/26/2022  [facility] Cardiologist: Kate Sable, MD   Subjective   He was switched to oral torsemide yesterday. UOP -2.7L. He feels overall tired and fatigued. Some chest pain on inspiration. Still with lower leg edema on exam.   Inpatient Medications    Scheduled Meds:  aspirin EC  81 mg Oral Daily   atorvastatin  80 mg Oral Daily   digoxin  0.125 mg Oral Daily   empagliflozin  10 mg Oral Daily   enoxaparin (LOVENOX) injection  0.5 mg/kg Subcutaneous Q24H   insulin aspart  0-15 Units Subcutaneous TID WC   insulin aspart  0-5 Units Subcutaneous QHS   insulin aspart  3 Units Subcutaneous TID WC   methocarbamol  750 mg Oral QID   midodrine  2.5 mg Oral BID   pantoprazole  40 mg Oral Daily   potassium chloride  20 mEq Oral Daily   pregabalin  100 mg Oral TID   QUEtiapine  25 mg Oral QHS   sodium chloride flush  3 mL Intravenous Q12H   spironolactone  12.5 mg Oral Daily   torsemide  20 mg Oral Daily   Continuous Infusions:  sodium chloride     PRN Meds: sodium chloride, acetaminophen, ALPRAZolam, bisacodyl, ondansetron (ZOFRAN) IV, ondansetron (ZOFRAN) IV, oxyCODONE, sodium chloride flush   Vital Signs    Vitals:   02/25/22 2320 02/26/22 0406 02/26/22 0629 02/26/22 0818  BP: 112/68 97/69  106/74  Pulse: 90 81  80  Resp: '18 18  19  '$ Temp: 98.1 F (36.7 C) 98.2 F (36.8 C)  98.4 F (36.9 C)  TempSrc:      SpO2: 97% 95%  98%  Weight:   104.4 kg   Height:        Intake/Output Summary (Last 24 hours) at 02/26/2022 1045 Last data filed at 02/26/2022 1031 Gross per 24 hour  Intake 360 ml  Output 1800 ml  Net -1440 ml      02/26/2022    6:29 AM 02/24/2022    4:52 AM 02/23/2022    4:24 AM  Last 3 Weights  Weight (lbs) 230 lb 3.2 oz 229 lb 4.8 oz 229 lb 11.2 oz  Weight (kg) 104.418 kg 104.01 kg 104.191 kg      Telemetry    NSR PVCs, HR 80s - Personally Reviewed  ECG    No new -  Personally Reviewed  Physical Exam   GEN: No acute distress.   Neck: No JVD Cardiac: RRR, no murmurs, rubs, or gallops.  Respiratory: Clear to auscultation bilaterally. GI: Soft, nontender, non-distended  MS: 2+ lower leg edema; No deformity. Neuro:  Nonfocal  Psych: Normal affect   Labs    High Sensitivity Troponin:   Recent Labs  Lab 02/05/22 0218 02/05/22 0403  TROPONINIHS 370* 367*     Chemistry Recent Labs  Lab 02/24/22 0614 02/25/22 0556 02/26/22 0452  NA 132* 132* 134*  K 3.8 3.9 4.2  CL 84* 88* 90*  CO2 36* 34* 34*  GLUCOSE 205* 220* 195*  BUN 50* 45* 47*  CREATININE 1.47* 1.17 1.35*  CALCIUM 9.5 9.4 9.6  GFRNONAA 56* >60 >60  ANIONGAP '12 10 10    '$ Lipids  Recent Labs  Lab 02/24/22 0614  CHOL 242*  TRIG 235*  HDL 35*  LDLCALC 160*  CHOLHDL 6.9    Hematology Recent Labs  Lab 02/22/22 0459 02/23/22 0410  WBC 7.3 7.3  RBC 4.27  4.34  HGB 13.1 13.3  HCT 40.5 40.8  MCV 94.8 94.0  MCH 30.7 30.6  MCHC 32.3 32.6  RDW 15.9* 15.3  PLT 367 368   Thyroid No results for input(s): "TSH", "FREET4" in the last 168 hours.  BNPNo results for input(s): "BNP", "PROBNP" in the last 168 hours.  DDimer No results for input(s): "DDIMER" in the last 168 hours.   Radiology    DG Chest Port 1 View  Result Date: 02/25/2022 CLINICAL DATA:  Left upper anterior chest pain. EXAM: PORTABLE CHEST 1 VIEW COMPARISON:  Chest radiograph 02/07/2022 FINDINGS: Bilateral pleural effusions have diminished, small volume of pleural fluid persists. Cardiomegaly is similar. No acute airspace disease. No pneumothorax. Slight vascular congestion without pulmonary edema. IMPRESSION: 1. Decreased bilateral pleural effusions from prior exam, small bilateral pleural effusions persist. 2. Cardiomegaly with mild vascular congestion. Electronically Signed   By: Keith Rake M.D.   On: 02/25/2022 18:43    Cardiac Studies   cMRI 7.7.23   IMPRESSION: 1.  Severely reduced Bi-ventricular  function.  LVEF 16%, RVEF 19%.   2. There is transmural subendocardial scar in the LV anterior wall and apex (LAD territory), indicating non viability.   3.  There is 25-50% subendocardial LGE/scar in the LV lateral wall.   4. The LV lateral and inferior walls appear viable (RCA and LCx territory).   5.  No significant valvular abnormalities.   6. Findings consistent with ischemic cardiomyopathy with viable LV lateral and inferior walls. Non-viable LV anterior and apical wall.     Electronically Signed   By: Kate Sable M.D.   On: 02/23/2022 16:23  2D Echocardiogram 6.20.2023   1. Left ventricular ejection fraction, by estimation, is 30 to 35%. The  left ventricle has moderately decreased function. The left ventricle  demonstrates global hypokinesis. Left ventricular diastolic function could  not be evaluated.   2. RV not well visualized but likely enlarged.   3. Large pleural effusion.   4. The mitral valve is normal in structure. Mild mitral valve  regurgitation.   5. The aortic valve is normal in structure. Aortic valve regurgitation is  not visualized.  _____________    Peripheral Angiography 6.28.2023   Findings:               Aortogram:  This demonstrated normal renal arteries and normal aorta and iliac segments without significant stenosis.             Left Lower Extremity: This demonstrated normal common femoral artery, profunda femoris artery was small but patent.  The SFA and popliteal arteries were widely patent.  There was a normal tibial trifurcation and three-vessel runoff distally with the posterior tibial artery being the dominant flow into the foot             Right Lower Extremity:  Imaging demonstrated normal common femoral artery, small but patent profunda femoris artery, SFA and popliteal arteries are widely patent.  The posterior tibial artery was again the dominant runoff but there were no focal stenosis in all 3 tibial vessels.  _____________     Cardiac Catheterization  7.5.2023                                  Left Main            Vessel is large. The vessel exhibits minimal luminal irregularities.  Left Anterior Descending                Vessel is large.                Prox LAD to Mid LAD lesion is 80% stenosed.                Mid LAD-1 lesion is 99% stenosed.                Mid LAD-2 lesion is 50% stenosed.                First Diagonal Branch    Vessel is large in size.    1st Diag lesion is 100% stenosed. The lesion is chronically occluded.    Ramus Intermedius  Vessel is small. There is moderate diffuse disease throughout the vessel.  Left Circumflex                    Vessel is large.                    Prox Cx lesion is 60% stenosed.                    Mid Cx to Dist Cx lesion is 60% stenosed.                    First Obtuse Marginal Branch      Vessel is large in size.      Collaterals      1st Mrg filled by collaterals from 3rd RPL.             1st Mrg lesion is 100% stenosed. The lesion is chronically occluded.      Lateral First Obtuse Marginal Branch                  Collaterals                  Lat 1st Mrg filled by collaterals from 3rd RPL.                                     Second Obtuse Marginal Branch              Vessel is small in size. There is severe disease in the vessel.              Third Obtuse Marginal Branch                              Vessel is small in size.                              Right Coronary Artery        Vessel is large. There is mild diffuse disease throughout the vessel.        Prox RCA lesion is 40% stenosed.        Dist RCA lesion is 70% stenosed.        Right Posterior Descending Artery          Vessel is small in size.          Collaterals          RPDA filled by collaterals from 1st Sept.  Collaterals          RPDA filled by collaterals from 2nd Sept.                     RPDA lesion is 100% stenosed. The lesion is chronically  occluded.          Right Posterior Atrioventricular Artery                      Vessel is moderate in size.                      First Right Posterolateral Branch                            Vessel is small in size.                            Second Right Posterolateral Branch                        Vessel is small in size.                        Third Right Posterolateral Branch                          Vessel is moderate in size.                            Right Heart Pressures RA (mean): 9 mmHg RV (S/EDP): 60/14 mmHg PA (S/D, mean): 60/25 (37) mmHg (diastolic pressure may be underestimated to due artifact on tracing) PCWP (mean): 30 mmHg with prominent V-waves up to 45 mmHg  Ao sat: 93% PA sat: 64%  Fick CO: 5.7 L/min Fick CI: 2.5 L/min/m^2  _____________    Patient Profile     56 y.o. male history of multivessel CAD, HFrEF, obesity, smoker x20+ years presenting with shortness of breath and edema, being seen for acute HFrEF and multivessel CAD.  Assessment & Plan    Acute HFrEF/ICM EF 30-35% - diagnostic Cath showed multivessel CAD.  - cMRI showed severe biV dysfunction with an LVEF 16% and RVEF 19%. He has transmural subendocardial scar in the LV anterior wall and apex indicating non-viability. Plan for medical management - IV lasix>transitioned to Torsemide '20mg'$  daily - Net -29.7L - continue Jardiance and digoxin - he is on midodrine for BP>>limiting GDMT - still with LLE with compression wraps. Follow kidney function. May need some more doses of IV lasix.   Multivessel CAD - Cath and cMRI as above, plan for medical therapy - Continue Aspirin and statin therapy - NO BB with hypotension  DM2 - newly diagnosed with A1C 7.7. - per IM  Tobacco use - cessation advised  HLD - LDL 160 - Lipitor '80mg'$  daily  Relative hypotension - on midodrine 2.'5mg'$  BID>wean as able   For questions or updates, please contact [facility] Please consult [email or web address] for contact  info under        Signed, Seymour Pavlak Ninfa Meeker, PA-C  02/26/2022, 10:45 AM

## 2022-02-26 NOTE — Progress Notes (Signed)
PROGRESS NOTE    Lawrence Murray  DDU:[id number] DOB: [birth year]  DOA: 02/05/2022 Date of Service: 02/26/22 PCP: Kathyrn Lass     Hospital Course:  56 y.o. male who has not seen a doctor in many years, presented 02/05/22 to the hospital because of shortness of breath and significant swelling in the lower extremities. acute on chronic systolic and diastolic CHF with anasarca.  2D echo showed estimated EF of 30 to 23% but LV diastolic function could not be evaluated.  He was treated with IV Lasix.  BP was soft so midodrine was started.  Troponin was elevated but this was attributed to demand ischemia.  Cardiologist recommended left and right heart cath once he is euvolemic. There was concern for peripheral arterial disease.  Patient underwent angiogram with vascular surgery on 6/28.  Good arterial flow noted.  No intervention was indicated.  Volume status is markedly improved.  As of 6/30 patient is 25.5 L net negative.  Symptomatically improved. 7/3-7/4: patient tolerating diuretics well.  7/5: L/R Cardiac Cath (+)severe three-vessel CAD appears chronic. Left and right heart pressures high (LVEDP 32 mmHg). Cardio recommend few more days aggressive diuresis, added metolazone to his scheduled Lasix. Planning MRI viability study  7/7: Cardiac MRI: Severely reduced Bi-ventricular function.  LVEF 16%, RVEF 19%. Findings consistent with ischemic cardiomyopathy with viable LV lateral and inferior walls. Non-viable LV anterior and apical wall. Per cardiology: DC metolazone.  Given MRI findings, minimal benefit from CABG, recommending aggressive medical therapy and no revascularization. 7/8-7/9: Continues to diurese, cardiology following.  7/10: Off IV Lasix, on torsemide 20 mg daily    Consultants:  Cardiology   Procedures: 02/14/22 angiogram LE 02/21/22 Left and Right Heart Catheterization w/ Dr End     Subjective: Patient reports overall doing okay, no complaints today     ASSESSMENT & PLAN:    Principal Problem:   Acute on chronic combined systolic and diastolic CHF (congestive heart failure) (HCC) Active Problems:   Smoker   Type 2 diabetes mellitus with hyperglycemia (HCC)   PVD (peripheral vascular disease) (HCC)   Acute HFrEF (heart failure with reduced ejection fraction) (HCC)   Coronary artery disease    Acute on chronic systolic and diastolic congestive heart failure Anasarca Net IO Since Admission: -47,059 mL [02/26/22 1648] Plan: Lasix 80 mg IV twice daily --> po torsemide 02/25/22 today  Continue Daily aldactone Daily renal function, creatinine has remained stable Continue midodrine 2.5 mg 3 times daily Unable to add GDMT at this time d/t low BP  Cardiac MRI 07/07: Severely reduced Bi-ventricular function.  LVEF 16%, RVEF 19%. Findings consistent with ischemic cardiomyopathy with viable LV lateral and inferior walls. Non-viable LV anterior and apical wall.  Per cardiology, medication recommendations: Torsemide 20 mg daily, consider switching to furosemide 40 mg daily which may be more affordable Jardiance Digoxin Currently on midodrine for low blood pressure, ideally would like to DC this and initiate low-dose carvedilol, ARB   Coronary artery disease Per Dr End (cardio) 07/05: Cath showed severe three-vessel CAD that appears chronic. Left and right heart pressures high (LVEDP 32 mmHg). Recommend few more days aggressive diuresis, was on metolazone in addition to his scheduled Lasix.  Based on MRI viability study, no CABG revascularization   Hypotension BP remains soft Continue midodrine 2.5 3 times daily Hold GDMT but eventually plan to be on beta-blocker and ARB   Elevated troponin Likely secondary demand ischemia No indication of ACS   Bilateral pleural effusions Likely secondary to decompensated heart  failure Patient on room air, clear lungs   Type 2 diabetes mellitus Hemoglobin A1c 7.7 SSI with NovoLog Continue empagliflozin Consider  metformin at discharge   Peripheral vascular disease Peripheral neuropathy Lower extremity angiogram reassuring Gabapentin not effective Seems to have been a result of Lyrica Plan: Continue Lyrica 100 3 times daily Continue Robaxin Avoid narcotics except breakthrough    Anxiety Seroquel as needed qhs for sleep/agitation as needed Xanax   Tobacco use disorder Counseled patient    DVT prophylaxis: lovenox Code Status: FULL Family Communication: spoke w/ mother on the phone 2 days ago, pt  declines call to her today  Disposition Plan / TOC needs: back to home environment  Barriers to discharge / significant pending items: If continuing to tolerate oral medications, may be able to switch off midodrine and start BB/ARB, will discuss with patient that our goal is not to keep him in the hospital until insurance/disability can be arranged, he will need to work on this himself but once he is medically stable we will discharge him with resources to appropriate follow-up keeping in mind his financial situation             Objective: Vitals:   02/26/22 0818 02/26/22 1216 02/26/22 1219 02/26/22 1618  BP: 106/74 (!) 86/67 96/63 112/75  Pulse: 80 90 89 95  Resp: '19 19  20  '$ Temp: 98.4 F (36.9 C) 98.3 F (36.8 C)  98 F (36.7 C)  TempSrc:    Oral  SpO2: 98% 97%  99%  Weight:      Height:        Intake/Output Summary (Last 24 hours) at 02/26/2022 1648 Last data filed at 02/26/2022 1619 Gross per 24 hour  Intake 600 ml  Output 4025 ml  Net -3425 ml    Filed Weights   02/23/22 0424 02/24/22 0452 02/26/22 0629  Weight: 104.2 kg 104 kg 104.4 kg    Examination:  Constitutional:  VS as above General Appearance: alert, well-developed, well-nourished, NAD Ears, Nose, Mouth, Throat: Normal appearance Neck: No masses, trachea midline Respiratory: Normal respiratory effort Breath sounds normal, no wheeze/rhonchi/rales Cardiovascular: S1/S2 normal, no murmur/rub/gallop  auscultated Unable to assess lower extremity edema - both legs bandaged/wrapped  Gastrointestinal: Nontender, no masses Musculoskeletal:  No clubbing/cyanosis of digits LE wrapped bilateral 2+ pitting edema where skin able to be examined  Neurological: No cranial nerve deficit on limited exam Psychiatric: Normal judgment/insight Normal mood and affect       Scheduled Medications:   aspirin EC  81 mg Oral Daily   atorvastatin  80 mg Oral Daily   digoxin  0.125 mg Oral Daily   empagliflozin  10 mg Oral Daily   enoxaparin (LOVENOX) injection  0.5 mg/kg Subcutaneous Q24H   insulin aspart  0-15 Units Subcutaneous TID WC   insulin aspart  0-5 Units Subcutaneous QHS   insulin aspart  3 Units Subcutaneous TID WC   methocarbamol  750 mg Oral QID   midodrine  2.5 mg Oral BID   pantoprazole  40 mg Oral Daily   potassium chloride  20 mEq Oral Daily   pregabalin  100 mg Oral TID   QUEtiapine  25 mg Oral QHS   sodium chloride flush  3 mL Intravenous Q12H   spironolactone  12.5 mg Oral Daily   torsemide  20 mg Oral Daily    Continuous Infusions:  sodium chloride      PRN Medications:  sodium chloride, acetaminophen, ALPRAZolam, bisacodyl, ondansetron (ZOFRAN) IV, ondansetron (ZOFRAN)  IV, oxyCODONE, sodium chloride flush  Antimicrobials:  Anti-infectives (From admission, onward)    Start     Dose/Rate Route Frequency Ordered Stop   02/14/22 1156  ceFAZolin (ANCEF) IVPB 1 g/50 mL premix        over 30 Minutes  Continuous PRN 02/14/22 1200 02/14/22 1328   02/14/22 1018  ceFAZolin (ANCEF) IVPB 2g/100 mL premix  Status:  Discontinued        2 g 200 mL/hr over 30 Minutes Intravenous 30 min pre-op 02/14/22 1018 02/14/22 1328   02/13/22 1816  ceFAZolin (ANCEF) IVPB 2g/100 mL premix  Status:  Discontinued        2 g 200 mL/hr over 30 Minutes Intravenous 30 min pre-op 02/13/22 1816 02/14/22 1023       Data Reviewed: I have personally reviewed following labs and imaging  studies  CBC: Recent Labs  Lab 02/22/22 0459 02/23/22 0410  WBC 7.3 7.3  HGB 13.1 13.3  HCT 40.5 40.8  MCV 94.8 94.0  PLT 367 283    Basic Metabolic Panel: Recent Labs  Lab 02/22/22 0459 02/23/22 0410 02/24/22 0614 02/25/22 0556 02/26/22 0452  NA 133* 135 132* 132* 134*  K 4.2 3.8 3.8 3.9 4.2  CL 92* 91* 84* 88* 90*  CO2 32 35* 36* 34* 34*  GLUCOSE 186* 192* 205* 220* 195*  BUN 36* 50* 50* 45* 47*  CREATININE 1.23 1.23 1.47* 1.17 1.35*  CALCIUM 9.1 9.3 9.5 9.4 9.6    GFR: Estimated Creatinine Clearance: 75.7 mL/min (A) (by C-G formula based on SCr of 1.35 mg/dL (H)). Liver Function Tests: No results for input(s): "AST", "ALT", "ALKPHOS", "BILITOT", "PROT", "ALBUMIN" in the last 168 hours. No results for input(s): "LIPASE", "AMYLASE" in the last 168 hours. No results for input(s): "AMMONIA" in the last 168 hours. Coagulation Profile: No results for input(s): "INR", "PROTIME" in the last 168 hours. Cardiac Enzymes: No results for input(s): "CKTOTAL", "CKMB", "CKMBINDEX", "TROPONINI" in the last 168 hours. BNP (last 3 results) No results for input(s): "PROBNP" in the last 8760 hours. HbA1C: No results for input(s): "HGBA1C" in the last 72 hours. CBG: Recent Labs  Lab 02/25/22 1710 02/25/22 2302 02/26/22 0844 02/26/22 1217 02/26/22 1614  GLUCAP 169* 183* 176* 194* 144*    Lipid Profile: Recent Labs    02/24/22 0614  CHOL 242*  HDL 35*  LDLCALC 160*  TRIG 235*  CHOLHDL 6.9    Thyroid Function Tests: No results for input(s): "TSH", "T4TOTAL", "FREET4", "T3FREE", "THYROIDAB" in the last 72 hours. Anemia Panel: No results for input(s): "VITAMINB12", "FOLATE", "FERRITIN", "TIBC", "IRON", "RETICCTPCT" in the last 72 hours. Urine analysis: No results found for: "COLORURINE", "APPEARANCEUR", "LABSPEC", "PHURINE", "GLUCOSEU", "HGBUR", "BILIRUBINUR", "KETONESUR", "PROTEINUR", "UROBILINOGEN", "NITRITE", "LEUKOCYTESUR" Sepsis  Labs: '@LABRCNTIP'$ (procalcitonin:4,lacticidven:4)  No results found for this or any previous visit (from the past 240 hour(s)).        Radiology Studies last 96 hours: DG Chest Port 1 View  Result Date: 02/25/2022 CLINICAL DATA:  Left upper anterior chest pain. EXAM: PORTABLE CHEST 1 VIEW COMPARISON:  Chest radiograph 02/07/2022 FINDINGS: Bilateral pleural effusions have diminished, small volume of pleural fluid persists. Cardiomegaly is similar. No acute airspace disease. No pneumothorax. Slight vascular congestion without pulmonary edema. IMPRESSION: 1. Decreased bilateral pleural effusions from prior exam, small bilateral pleural effusions persist. 2. Cardiomegaly with mild vascular congestion. Electronically Signed   By: Keith Rake M.D.   On: 02/25/2022 18:43   MR CARDIAC MORPHOLOGY W WO CONTRAST  Result Date: 02/23/2022 CLINICAL  DATA:  Ischemic cardiomyopathy, evaluate for viability. EXAM: CARDIAC MRI TECHNIQUE: The patient was scanned on a 1.5 Tesla Siemens magnet. A dedicated cardiac coil was used. Functional imaging was done using Fiesta sequences. 2,3, and 4 chamber views were done to assess for RWMA's. Modified Simpson's rule using a short axis stack was used to calculate an ejection fraction on a dedicated work Conservation officer, nature. The patient received 10 cc of Gadavist. After 10 minutes inversion recovery sequences were used to assess for infiltration and scar tissue. CONTRAST:  10 cc  of Gadavist FINDINGS: 1. Mildly dilated left ventricular size, severely reduced LV systolic function (LVEF = 16%). There is global hypokinesis with anterior LV wall akinesis. The entire LV anterior wall appears thinned. There is transmural subendocardial late gadolinium enhancement in the entire anterior wall and apex of the left ventricular myocardium. There is 25-50% subendocardial LGE/scar in the LV lateral wall. LVEDV: 265 ml LVESV: 223 ml SV: 42 ml CO: 3.6 L/min Myocardial mass: 138 g 2.  Normal right ventricular size, thickness, severely reduced systolic function (RVEF = 19%). There is global hypokinesis. 3.  Normal left and right atrial size. 4. Normal size of the aortic root, ascending aorta and pulmonary artery. 5.  No significant valvular abnormalities. 6.  Normal pericardium.  No pericardial effusion. IMPRESSION: 1.  Severely reduced Bi-ventricular function.  LVEF 16%, RVEF 19%. 2. There is transmural subendocardial scar in the LV anterior wall and apex (LAD territory), indicating non viability. 3.  There is 25-50% subendocardial LGE/scar in the LV lateral wall. 4. The LV lateral and inferior walls appear viable (RCA and LCx territory). 5.  No significant valvular abnormalities. 6. Findings consistent with ischemic cardiomyopathy with viable LV lateral and inferior walls. Non-viable LV anterior and apical wall. Electronically Signed   By: Kate Sable M.D.   On: 02/23/2022 16:23            LOS: 21 days     Emeterio Reeve, DO [facility] 02/26/2022, 4:48 PM   Staff may message me via secure chat in [place]  but this may not receive immediate response,  please page for urgent matters!  If 7PM-7AM, please contact night-coverage [email or web address]  Dictation software was used to generate the above note. Typos may occur and escape review, as with typed/written notes. Please contact Dr Sheppard Coil directly for clarity if needed.

## 2022-02-26 NOTE — Plan of Care (Signed)

## 2022-02-27 ENCOUNTER — Ambulatory Visit: Payer: Self-pay | Admitting: Family

## 2022-02-27 DIAGNOSIS — I5043 Acute on chronic combined systolic (congestive) and diastolic (congestive) heart failure: Secondary | ICD-10-CM | POA: Diagnosis not present

## 2022-02-27 DIAGNOSIS — I5021 Acute systolic (congestive) heart failure: Secondary | ICD-10-CM | POA: Diagnosis not present

## 2022-02-27 DIAGNOSIS — I2510 Atherosclerotic heart disease of native coronary artery without angina pectoris: Secondary | ICD-10-CM | POA: Diagnosis not present

## 2022-02-27 LAB — BASIC METABOLIC PANEL
Anion gap: 11 (ref 5–15)
BUN: 45 mg/dL — ABNORMAL HIGH (ref 6–20)
CO2: 33 mmol/L — ABNORMAL HIGH (ref 22–32)
Calcium: 9.3 mg/dL (ref 8.9–10.3)
Chloride: 90 mmol/L — ABNORMAL LOW (ref 98–111)
Creatinine, Ser: 1.33 mg/dL — ABNORMAL HIGH (ref 0.61–1.24)
GFR, Estimated: >60 mL/min (ref >60)
Glucose, Bld: 202 mg/dL — ABNORMAL HIGH (ref 70–99)
Potassium: 4.2 mmol/L (ref 3.5–5.1)
Sodium: 134 mmol/L — ABNORMAL LOW (ref 135–145)

## 2022-02-27 LAB — GLUCOSE, CAPILLARY
Glucose-Capillary: 220 mg/dL — ABNORMAL HIGH (ref 70–99)
Glucose-Capillary: 246 mg/dL — ABNORMAL HIGH (ref 70–99)
Glucose-Capillary: 174 mg/dL — ABNORMAL HIGH (ref 70–99)
Glucose-Capillary: 202 mg/dL — ABNORMAL HIGH (ref 70–99)

## 2022-02-27 MED ORDER — FUROSEMIDE 40 MG PO TABS
40.0000 mg | ORAL_TABLET | Freq: Every day | ORAL | Status: DC
  Administered 2022-02-27 – 2022-03-01 (×3): 40 mg via ORAL
  Filled 2022-02-27 (×3): qty 1

## 2022-02-27 MED ORDER — METOPROLOL SUCCINATE ER 25 MG PO TB24
12.5000 mg | ORAL_TABLET | Freq: Every day | ORAL | Status: DC
  Administered 2022-02-27 – 2022-03-01 (×3): 12.5 mg via ORAL
  Filled 2022-02-27 (×3): qty 1

## 2022-02-27 NOTE — Progress Notes (Signed)
Progress Note  Patient Name: Lawrence Murray Date of Encounter: 02/27/2022  [facility] Cardiologist: Kate Sable, MD   Subjective   Patient states he feels better, more rested overnight. Continues to have some chest pain with inspiration. Continues to have lower extremity edema with wraps on.   Inpatient Medications    Scheduled Meds:  aspirin EC  81 mg Oral Daily   atorvastatin  80 mg Oral Daily   digoxin  0.125 mg Oral Daily   empagliflozin  10 mg Oral Daily   enoxaparin (LOVENOX) injection  0.5 mg/kg Subcutaneous Q24H   furosemide  40 mg Oral Daily   insulin aspart  0-15 Units Subcutaneous TID WC   insulin aspart  0-5 Units Subcutaneous QHS   insulin aspart  3 Units Subcutaneous TID WC   methocarbamol  750 mg Oral QID   metoprolol succinate  12.5 mg Oral Daily   midodrine  2.5 mg Oral BID   pantoprazole  40 mg Oral Daily   potassium chloride  20 mEq Oral Daily   pregabalin  100 mg Oral TID   QUEtiapine  25 mg Oral QHS   sodium chloride flush  3 mL Intravenous Q12H   spironolactone  12.5 mg Oral Daily   Continuous Infusions:  sodium chloride     PRN Meds: sodium chloride, acetaminophen, ALPRAZolam, bisacodyl, ondansetron (ZOFRAN) IV, ondansetron (ZOFRAN) IV, oxyCODONE, sodium chloride flush   Vital Signs    Vitals:   02/26/22 2343 02/27/22 0500 02/27/22 0518 02/27/22 0804  BP: 109/74  104/77 100/63  Pulse: 92  87 88  Resp:   18 18  Temp: 99.1 F (37.3 C)  98 F (36.7 C) 98.1 F (36.7 C)  TempSrc:   Oral Oral  SpO2: 97%  97% 96%  Weight:  104.3 kg    Height:        Intake/Output Summary (Last 24 hours) at 02/27/2022 0924 Last data filed at 02/27/2022 0804 Gross per 24 hour  Intake 720 ml  Output 5075 ml  Net -4355 ml      02/27/2022    5:00 AM 02/26/2022    6:29 AM 02/24/2022    4:52 AM  Last 3 Weights  Weight (lbs) 229 lb 15 oz 230 lb 3.2 oz 229 lb 4.8 oz  Weight (kg) 104.3 kg 104.418 kg 104.01 kg      Telemetry    NSR, occasional PVCs,  rate in the 80s - Personally Reviewed  ECG    No new readings - Personally Reviewed  Physical Exam   GEN: No acute distress.   Neck: No JVD Cardiac: RRR, no murmurs, rubs, or gallops.  Respiratory: Clear to auscultation bilaterally. GI: Soft, nontender, non-distended  MS: +2 bilateral lower extremity edema; No deformity. Neuro:  Nonfocal  Psych: Normal affect   Labs    High Sensitivity Troponin:   Recent Labs  Lab 02/05/22 0218 02/05/22 0403  TROPONINIHS 370* 367*     Chemistry Recent Labs  Lab 02/25/22 0556 02/26/22 0452 02/27/22 0435  NA 132* 134* 134*  K 3.9 4.2 4.2  CL 88* 90* 90*  CO2 34* 34* 33*  GLUCOSE 220* 195* 202*  BUN 45* 47* 45*  CREATININE 1.17 1.35* 1.33*  CALCIUM 9.4 9.6 9.3  GFRNONAA >60 >60 >60  ANIONGAP '10 10 11    '$ Lipids  Recent Labs  Lab 02/24/22 0614  CHOL 242*  TRIG 235*  HDL 35*  LDLCALC 160*  CHOLHDL 6.9    Hematology Recent Labs  Lab 02/22/22 0459 02/23/22 0410  WBC 7.3 7.3  RBC 4.27 4.34  HGB 13.1 13.3  HCT 40.5 40.8  MCV 94.8 94.0  MCH 30.7 30.6  MCHC 32.3 32.6  RDW 15.9* 15.3  PLT 367 368   Thyroid No results for input(s): "TSH", "FREET4" in the last 168 hours.  BNPNo results for input(s): "BNP", "PROBNP" in the last 168 hours.  DDimer No results for input(s): "DDIMER" in the last 168 hours.   Radiology    DG Chest Port 1 View  Result Date: 02/25/2022 CLINICAL DATA:  Left upper anterior chest pain. EXAM: PORTABLE CHEST 1 VIEW COMPARISON:  Chest radiograph 02/07/2022 FINDINGS: Bilateral pleural effusions have diminished, small volume of pleural fluid persists. Cardiomegaly is similar. No acute airspace disease. No pneumothorax. Slight vascular congestion without pulmonary edema. IMPRESSION: 1. Decreased bilateral pleural effusions from prior exam, small bilateral pleural effusions persist. 2. Cardiomegaly with mild vascular congestion. Electronically Signed   By: Keith Rake M.D.   On: 02/25/2022 18:43     Cardiac Studies   cMRI 7.7.23   IMPRESSION: 1.  Severely reduced Bi-ventricular function.  LVEF 16%, RVEF 19%.   2. There is transmural subendocardial scar in the LV anterior wall and apex (LAD territory), indicating non viability.   3.  There is 25-50% subendocardial LGE/scar in the LV lateral wall.   4. The LV lateral and inferior walls appear viable (RCA and LCx territory).   5.  No significant valvular abnormalities.   6. Findings consistent with ischemic cardiomyopathy with viable LV lateral and inferior walls. Non-viable LV anterior and apical wall.     Electronically Signed   By: Kate Sable M.D.   On: 02/23/2022 16:23   2D Echocardiogram 6.20.2023   1. Left ventricular ejection fraction, by estimation, is 30 to 35%. The  left ventricle has moderately decreased function. The left ventricle  demonstrates global hypokinesis. Left ventricular diastolic function could  not be evaluated.   2. RV not well visualized but likely enlarged.   3. Large pleural effusion.   4. The mitral valve is normal in structure. Mild mitral valve  regurgitation.   5. The aortic valve is normal in structure. Aortic valve regurgitation is  not visualized.  _____________    Peripheral Angiography 6.28.2023   Findings:               Aortogram:  This demonstrated normal renal arteries and normal aorta and iliac segments without significant stenosis.             Left Lower Extremity: This demonstrated normal common femoral artery, profunda femoris artery was small but patent.  The SFA and popliteal arteries were widely patent.  There was a normal tibial trifurcation and three-vessel runoff distally with the posterior tibial artery being the dominant flow into the foot             Right Lower Extremity:  Imaging demonstrated normal common femoral artery, small but patent profunda femoris artery, SFA and popliteal arteries are widely patent.  The posterior tibial artery was again the  dominant runoff but there were no focal stenosis in all 3 tibial vessels.  _____________    Cardiac Catheterization  7.5.2023                                  Left Main            Vessel is large. The  vessel exhibits minimal luminal irregularities.            Left Anterior Descending                Vessel is large.                Prox LAD to Mid LAD lesion is 80% stenosed.                Mid LAD-1 lesion is 99% stenosed.                Mid LAD-2 lesion is 50% stenosed.                First Diagonal Branch    Vessel is large in size.    1st Diag lesion is 100% stenosed. The lesion is chronically occluded.    Ramus Intermedius  Vessel is small. There is moderate diffuse disease throughout the vessel.  Left Circumflex                    Vessel is large.                    Prox Cx lesion is 60% stenosed.                    Mid Cx to Dist Cx lesion is 60% stenosed.                    First Obtuse Marginal Branch      Vessel is large in size.      Collaterals      1st Mrg filled by collaterals from 3rd RPL.             1st Mrg lesion is 100% stenosed. The lesion is chronically occluded.      Lateral First Obtuse Marginal Branch                  Collaterals                  Lat 1st Mrg filled by collaterals from 3rd RPL.                                     Second Obtuse Marginal Branch              Vessel is small in size. There is severe disease in the vessel.              Third Obtuse Marginal Branch                              Vessel is small in size.                              Right Coronary Artery        Vessel is large. There is mild diffuse disease throughout the vessel.        Prox RCA lesion is 40% stenosed.        Dist RCA lesion is 70% stenosed.        Right Posterior Descending Artery          Vessel is small in size.          Collaterals  RPDA filled by collaterals from 1st Sept.                     Collaterals          RPDA filled by collaterals from  2nd Sept.                     RPDA lesion is 100% stenosed. The lesion is chronically occluded.          Right Posterior Atrioventricular Artery                      Vessel is moderate in size.                      First Right Posterolateral Branch                            Vessel is small in size.                            Second Right Posterolateral Branch                        Vessel is small in size.                        Third Right Posterolateral Branch                          Vessel is moderate in size.                            Right Heart Pressures RA (mean): 9 mmHg RV (S/EDP): 60/14 mmHg PA (S/D, mean): 60/25 (37) mmHg (diastolic pressure may be underestimated to due artifact on tracing) PCWP (mean): 30 mmHg with prominent V-waves up to 45 mmHg  Ao sat: 93% PA sat: 64%  Fick CO: 5.7 L/min Fick CI: 2.5 L/min/m^2    Patient Profile     56 y.o. male with a history of multivessel CAD, HFrEF, obesity, smoker x20+ years presenting with shortness of breath and edema, being seen for acute HFrEF and multivessel CAD.  Assessment & Plan    Acute HFrEF/ICM EF 30-35% - diagnostic Cath showed multivessel CAD.  - cMRI showed severe biV dysfunction with an LVEF 16% and RVEF 19%. He has transmural subendocardial scar in the LV anterior wall and apex indicating non-viability. Plan for medical management - IV lasix was transitioned to Torsemide '20mg'$  daily on 7/9 d/t rise in creat - Discontinue Torsemide on 7/11 - Start lasix 40 mg daily - Monitoring creatinine, 1.33 today, down from 1.35 yesterday - Start Toprol-XL 12.5 mg daily - Down 4 L yesterday, Net -32.1L since admission - continue Jardiance and digoxin, dig level 0.6 on 7/10 - Continue midodrine in light of relative hypotension - GDMT as BP allows   Multivessel CAD - Cath and cMRI as above, medical management plan - Continue Aspirin and statin therapy - Started Toprol-XL 12.5 mg daily   DM2 - newly diagnosed with  A1C 7.7. - Continue SSI and Jardiance  - per IM   Tobacco use - cessation advised - declined replacement therapy   HLD - LDL 160 - Continue Lipitor '80mg'$  daily   Hypotension - stable, systolics  90's to low 100's - on midodrine 2.'5mg'$  BID, wean as able   For questions or updates, please contact [place] Please consult [email or web address] for contact info under     Signed, Krissie Merrick, NP  02/27/2022, 9:24 AM

## 2022-02-27 NOTE — Progress Notes (Signed)
PROGRESS NOTE    Lawrence Murray  LEX:[id number] DOB: Sep 01, 1965  DOA: 02/05/2022 Date of Service: 02/27/22 PCP: Kathyrn Lass     Hospital Course:  56 y.o. male who has not seen a doctor in many years, presented 02/05/22 to the hospital because of shortness of breath and significant swelling in the lower extremities. acute on chronic systolic and diastolic CHF with anasarca.  2D echo showed estimated EF of 30 to 44% but LV diastolic function could not be evaluated.  He was treated with IV Lasix.  BP was soft so midodrine was started.  Troponin was elevated but this was attributed to demand ischemia.  Cardiologist recommended left and right heart cath once he is euvolemic. There was concern for peripheral arterial disease.  Patient underwent angiogram with vascular surgery on 6/28.  Good arterial flow noted.  No intervention was indicated.  Volume status is markedly improved.  As of 6/30 patient is 25.5 L net negative.  Symptomatically improved. 7/3-7/4: patient tolerating diuretics well.  7/5: L/R Cardiac Cath (+)severe three-vessel CAD appears chronic. Left and right heart pressures high (LVEDP 32 mmHg). Cardio recommend few more days aggressive diuresis, added metolazone to his scheduled Lasix. Planning MRI viability study  7/7: Cardiac MRI: Severely reduced Bi-ventricular function.  LVEF 16%, RVEF 19%. Findings consistent with ischemic cardiomyopathy with viable LV lateral and inferior walls. Non-viable LV anterior and apical wall. Per cardiology: DC metolazone.  Given MRI findings, minimal benefit from CABG, recommending aggressive medical therapy and no revascularization. 7/8-7/9: Continues to diurese, cardiology following.  7/10: Off IV Lasix, on torsemide 20 mg daily  7/11: furosemide 40 mg daily (cheaper than torsemide), added metoprolol succinate 12.5 mg daily, continue digoxin, dapagliflozin, spironolactone. Per cardiology appears stable. Approaching medical stability   Consultants:   Cardiology   Procedures: 02/14/22 angiogram LE 02/21/22 Left and Right Heart Catheterization w/ Dr End     Subjective: Patient reports overall doing okay, no complaints today - he is on the phone w/ his dad so declined interview/exam with me today      ASSESSMENT & PLAN:   Principal Problem:   Acute on chronic combined systolic and diastolic CHF (congestive heart failure) ([place]) Active Problems:   Smoker   Type 2 diabetes mellitus with hyperglycemia ([place])   PVD (peripheral vascular disease) ([place])   Acute HFrEF (heart failure with reduced ejection fraction) ([place])   Coronary artery disease    Acute on chronic systolic and diastolic congestive heart failure Anasarca Net IO Since Admission: -50,814 mL [02/27/22 1622] Plan: furosemide 40 mg daily (cheaper than torsemide), added metoprolol succinate 12.5 mg daily, continue digoxin, dapagliflozin, spironolactone. Per cardiology appears stable. Approaching medical stability Unable to add GDMT at this time d/t low BP  Cardiac MRI 07/07: Severely reduced Bi-ventricular function.  LVEF 16%, RVEF 19%. Findings consistent with ischemic cardiomyopathy with viable LV lateral and inferior walls. Non-viable LV anterior and apical wall.    Coronary artery disease Per Dr End (cardio) 07/05: Cath showed severe three-vessel CAD that appears chronic. Left and right heart pressures high (LVEDP 32 mmHg). Recommend few more days aggressive diuresis, was on metolazone in addition to his scheduled Lasix.  Based on MRI viability study, no CABG revascularization  furosemide 40 mg daily (cheaper than torsemide), added metoprolol succinate 12.5 mg daily, continue digoxin, dapagliflozin, spironolactone. Per cardiology appears stable. Approaching medical stability  Hypotension BP remains soft Continue midodrine 2.5 3 times daily Hold GDMT but eventually plan to be on beta-blocker and ARB  Elevated troponin Likely secondary demand ischemia No  indication of ACS   Bilateral pleural effusions Likely secondary to decompensated heart failure Patient on room air, clear lungs   Type 2 diabetes mellitus Hemoglobin A1c 7.7 SSI with NovoLog Continue empagliflozin Consider metformin at discharge   Peripheral vascular disease Peripheral neuropathy Lower extremity angiogram reassuring Gabapentin not effective Seems to have been a result of Lyrica Plan: Continue Lyrica 100 3 times daily Continue Robaxin Avoid narcotics except breakthrough    Anxiety Seroquel as needed qhs for sleep/agitation as needed Xanax   Tobacco use disorder Counseled patient    DVT prophylaxis: lovenox Code Status: FULL Family Communication: spoke w/ mother on the phone 2 days ago, pt  declines call to her today  Disposition Plan / TOC needs: back to home environment. TOC trying to arrange what resources we can for him given his financial situation  Barriers to discharge / significant pending items: approaching medical stability             Objective: Vitals:   02/27/22 0500 02/27/22 0518 02/27/22 0804 02/27/22 1157  BP:  104/77 100/63 92/60  Pulse:  87 88 87  Resp:  '18 18 19  '$ Temp:  98 F (36.7 C) 98.1 F (36.7 C) 98.2 F (36.8 C)  TempSrc:  Oral Oral Oral  SpO2:  97% 96% 96%  Weight: 104.3 kg     Height:        Intake/Output Summary (Last 24 hours) at 02/27/2022 1622 Last data filed at 02/27/2022 1539 Gross per 24 hour  Intake 720 ml  Output 4475 ml  Net -3755 ml    Filed Weights   02/24/22 0452 02/26/22 0629 02/27/22 0500  Weight: 104 kg 104.4 kg 104.3 kg    Examination:  Constitutional:  VS as above General Appearance: alert, well-developed, well-nourished, NAD Respiratory: Normal respiratory effort Cardiovascular: Unable to assess lower extremity edema - both legs bandaged/wrapped  Psychiatric: Normal judgment/insight Normal mood and affect       Scheduled Medications:   aspirin EC  81 mg Oral Daily    atorvastatin  80 mg Oral Daily   digoxin  0.125 mg Oral Daily   empagliflozin  10 mg Oral Daily   enoxaparin (LOVENOX) injection  0.5 mg/kg Subcutaneous Q24H   furosemide  40 mg Oral Daily   insulin aspart  0-15 Units Subcutaneous TID WC   insulin aspart  0-5 Units Subcutaneous QHS   insulin aspart  3 Units Subcutaneous TID WC   methocarbamol  750 mg Oral QID   metoprolol succinate  12.5 mg Oral Daily   midodrine  2.5 mg Oral BID   pantoprazole  40 mg Oral Daily   potassium chloride  20 mEq Oral Daily   pregabalin  100 mg Oral TID   QUEtiapine  25 mg Oral QHS   sodium chloride flush  3 mL Intravenous Q12H   spironolactone  12.5 mg Oral Daily    Continuous Infusions:  sodium chloride      PRN Medications:  sodium chloride, acetaminophen, ALPRAZolam, bisacodyl, ondansetron (ZOFRAN) IV, ondansetron (ZOFRAN) IV, oxyCODONE, sodium chloride flush  Antimicrobials:  Anti-infectives (From admission, onward)    Start     Dose/Rate Route Frequency Ordered Stop   02/14/22 1156  ceFAZolin (ANCEF) IVPB 1 g/50 mL premix        over 30 Minutes  Continuous PRN 02/14/22 1200 02/14/22 1328   02/14/22 1018  ceFAZolin (ANCEF) IVPB 2g/100 mL premix  Status:  Discontinued  2 g 200 mL/hr over 30 Minutes Intravenous 30 min pre-op 02/14/22 1018 02/14/22 1328   02/13/22 1816  ceFAZolin (ANCEF) IVPB 2g/100 mL premix  Status:  Discontinued        2 g 200 mL/hr over 30 Minutes Intravenous 30 min pre-op 02/13/22 1816 02/14/22 1023       Data Reviewed: I have personally reviewed following labs and imaging studies  CBC: Recent Labs  Lab 02/22/22 0459 02/23/22 0410  WBC 7.3 7.3  HGB 13.1 13.3  HCT 40.5 40.8  MCV 94.8 94.0  PLT 367 458    Basic Metabolic Panel: Recent Labs  Lab 02/23/22 0410 02/24/22 0614 02/25/22 0556 02/26/22 0452 02/27/22 0435  NA 135 132* 132* 134* 134*  K 3.8 3.8 3.9 4.2 4.2  CL 91* 84* 88* 90* 90*  CO2 35* 36* 34* 34* 33*  GLUCOSE 192* 205* 220* 195*  202*  BUN 50* 50* 45* 47* 45*  CREATININE 1.23 1.47* 1.17 1.35* 1.33*  CALCIUM 9.3 9.5 9.4 9.6 9.3    GFR: Estimated Creatinine Clearance: 76.8 mL/min (A) (by C-G formula based on SCr of 1.33 mg/dL (H)). Liver Function Tests: No results for input(s): "AST", "ALT", "ALKPHOS", "BILITOT", "PROT", "ALBUMIN" in the last 168 hours. No results for input(s): "LIPASE", "AMYLASE" in the last 168 hours. No results for input(s): "AMMONIA" in the last 168 hours. Coagulation Profile: No results for input(s): "INR", "PROTIME" in the last 168 hours. Cardiac Enzymes: No results for input(s): "CKTOTAL", "CKMB", "CKMBINDEX", "TROPONINI" in the last 168 hours. BNP (last 3 results) No results for input(s): "PROBNP" in the last 8760 hours. HbA1C: No results for input(s): "HGBA1C" in the last 72 hours. CBG: Recent Labs  Lab 02/26/22 1217 02/26/22 1614 02/26/22 2026 02/27/22 0818 02/27/22 1155  GLUCAP 194* 144* 222* 220* 246*    Lipid Profile: No results for input(s): "CHOL", "HDL", "LDLCALC", "TRIG", "CHOLHDL", "LDLDIRECT" in the last 72 hours.  Thyroid Function Tests: No results for input(s): "TSH", "T4TOTAL", "FREET4", "T3FREE", "THYROIDAB" in the last 72 hours. Anemia Panel: No results for input(s): "VITAMINB12", "FOLATE", "FERRITIN", "TIBC", "IRON", "RETICCTPCT" in the last 72 hours. Urine analysis: No results found for: "COLORURINE", "APPEARANCEUR", "LABSPEC", "PHURINE", "GLUCOSEU", "HGBUR", "BILIRUBINUR", "KETONESUR", "PROTEINUR", "UROBILINOGEN", "NITRITE", "LEUKOCYTESUR" Sepsis Labs: '@LABRCNTIP'$ (procalcitonin:4,lacticidven:4)  No results found for this or any previous visit (from the past 240 hour(s)).        Radiology Studies last 96 hours: DG Chest Port 1 View  Result Date: 02/25/2022 CLINICAL DATA:  Left upper anterior chest pain. EXAM: PORTABLE CHEST 1 VIEW COMPARISON:  Chest radiograph 02/07/2022 FINDINGS: Bilateral pleural effusions have diminished, small volume of pleural  fluid persists. Cardiomegaly is similar. No acute airspace disease. No pneumothorax. Slight vascular congestion without pulmonary edema. IMPRESSION: 1. Decreased bilateral pleural effusions from prior exam, small bilateral pleural effusions persist. 2. Cardiomegaly with mild vascular congestion. Electronically Signed   By: Keith Rake M.D.   On: 02/25/2022 18:43            LOS: 22 days     Emeterio Reeve, DO [facility] 02/27/2022, 4:22 PM   Staff may message me via secure chat in [place]  but this may not receive immediate response,  please page for urgent matters!  If 7PM-7AM, please contact night-coverage [email or web address]  Dictation software was used to generate the above note. Typos may occur and escape review, as with typed/written notes. Please contact Dr Sheppard Coil directly for clarity if needed.

## 2022-02-27 NOTE — Plan of Care (Signed)

## 2022-02-28 DIAGNOSIS — I2510 Atherosclerotic heart disease of native coronary artery without angina pectoris: Secondary | ICD-10-CM | POA: Diagnosis not present

## 2022-02-28 DIAGNOSIS — I5043 Acute on chronic combined systolic (congestive) and diastolic (congestive) heart failure: Secondary | ICD-10-CM | POA: Diagnosis not present

## 2022-02-28 DIAGNOSIS — I5021 Acute systolic (congestive) heart failure: Secondary | ICD-10-CM | POA: Diagnosis not present

## 2022-02-28 LAB — BASIC METABOLIC PANEL
Anion gap: 10 (ref 5–15)
BUN: 39 mg/dL — ABNORMAL HIGH (ref 6–20)
CO2: 29 mmol/L (ref 22–32)
Calcium: 9.3 mg/dL (ref 8.9–10.3)
Chloride: 95 mmol/L — ABNORMAL LOW (ref 98–111)
Creatinine, Ser: 1.11 mg/dL (ref 0.61–1.24)
GFR, Estimated: >60 mL/min (ref >60)
Glucose, Bld: 213 mg/dL — ABNORMAL HIGH (ref 70–99)
Potassium: 4.5 mmol/L (ref 3.5–5.1)
Sodium: 134 mmol/L — ABNORMAL LOW (ref 135–145)

## 2022-02-28 LAB — GLUCOSE, CAPILLARY
Glucose-Capillary: 193 mg/dL — ABNORMAL HIGH (ref 70–99)
Glucose-Capillary: 154 mg/dL — ABNORMAL HIGH (ref 70–99)
Glucose-Capillary: 192 mg/dL — ABNORMAL HIGH (ref 70–99)
Glucose-Capillary: 207 mg/dL — ABNORMAL HIGH (ref 70–99)

## 2022-02-28 MED ORDER — INSULIN ASPART 100 UNIT/ML IJ SOLN
5.0000 [IU] | Freq: Three times a day (TID) | INTRAMUSCULAR | Status: DC
  Administered 2022-02-28 – 2022-03-01 (×3): 5 [IU] via SUBCUTANEOUS
  Filled 2022-02-28 (×4): qty 1

## 2022-02-28 NOTE — Inpatient Diabetes Management (Signed)
Inpatient Diabetes Program Recommendations  AACE/ADA: New Consensus Statement on Inpatient Glycemic Control   Target Ranges:  Prepandial:   less than 140 mg/dL      Peak postprandial:   less than 180 mg/dL (1-2 hours)      Critically ill patients:  140 - 180 mg/dL    Latest Reference Range & Units 02/27/22 08:18 02/27/22 11:55 02/27/22 16:24 02/27/22 19:54 02/28/22 07:31  Glucose-Capillary 70 - 99 mg/dL 220 (H) 246 (H) 174 (H) 202 (H) 193 (H)   Review of Glycemic Control  Diabetes history: No (new DM dx this admission) Outpatient Diabetes medications: NA Current orders for Inpatient glycemic control: Novolog 0-15 units TID with meals, Novolog 0-5 units QHS, Novolog 3 units TID with meals for meal coverage, Jardiance 10 mg daily  Inpatient Diabetes Program Recommendations:    Insulin: If post prandial glucose continues to be consistently over 180 mg/dl, please consider increasing meal coverage to Novolog 5 units TID with meals.  Diet: Added Carb Modified to Heart Healthy diet.  Thanks, Barnie Alderman, RN, MSN, [place] Diabetes Coordinator Inpatient Diabetes Program [phone] (Team Pager from 8am to [place])

## 2022-02-28 NOTE — Progress Notes (Signed)
Progress Note  Patient Name: Lawrence Murray Date of Encounter: 02/28/2022  [facility] Cardiologist: Kate Sable, MD   Subjective   Sleeping comfortably in bed, breathing is much improved, now able to lay flat.  Still has leg edema.  Inpatient Medications    Scheduled Meds:  aspirin EC  81 mg Oral Daily   atorvastatin  80 mg Oral Daily   digoxin  0.125 mg Oral Daily   empagliflozin  10 mg Oral Daily   enoxaparin (LOVENOX) injection  0.5 mg/kg Subcutaneous Q24H   furosemide  40 mg Oral Daily   insulin aspart  0-15 Units Subcutaneous TID WC   insulin aspart  0-5 Units Subcutaneous QHS   insulin aspart  5 Units Subcutaneous TID WC   methocarbamol  750 mg Oral QID   metoprolol succinate  12.5 mg Oral Daily   pantoprazole  40 mg Oral Daily   potassium chloride  20 mEq Oral Daily   pregabalin  100 mg Oral TID   QUEtiapine  25 mg Oral QHS   sodium chloride flush  3 mL Intravenous Q12H   spironolactone  12.5 mg Oral Daily   Continuous Infusions:  sodium chloride      PRN Meds: sodium chloride, acetaminophen, ALPRAZolam, bisacodyl, ondansetron (ZOFRAN) IV, ondansetron (ZOFRAN) IV, oxyCODONE, sodium chloride flush   Vital Signs    Vitals:   02/27/22 2349 02/28/22 0513 02/28/22 0731 02/28/22 1114  BP: 118/82 112/80 107/69 95/65  Pulse: 89 98 96 88  Resp: '20 19 16 20  '$ Temp: 99.2 F (37.3 C) 98.7 F (37.1 C) 98.9 F (37.2 C) 98.8 F (37.1 C)  TempSrc: Oral Oral  Axillary  SpO2: 100% 97% 99% 95%  Weight:  105.1 kg    Height:        Intake/Output Summary (Last 24 hours) at 02/28/2022 1248 Last data filed at 02/28/2022 1110 Gross per 24 hour  Intake 480 ml  Output 2925 ml  Net -2445 ml      02/28/2022    5:13 AM 02/27/2022    5:00 AM 02/26/2022    6:29 AM  Last 3 Weights  Weight (lbs) 231 lb 9.6 oz 229 lb 15 oz 230 lb 3.2 oz  Weight (kg) 105.053 kg 104.3 kg 104.418 kg      Telemetry    Sinus rhythm- Personally Reviewed  ECG     - Personally  Reviewed  Physical Exam   GEN: No acute distress.   Neck: No JVD Cardiac: RRR, no murmurs, rubs, or gallops.  Respiratory: Diminished breath sounds bilaterally GI: Soft, nontender, non-distended  MS: 1-2+ edema; No deformity. Neuro:  Nonfocal  Psych: Normal affect   Labs    High Sensitivity Troponin:   Recent Labs  Lab 02/05/22 0218 02/05/22 0403  TROPONINIHS 370* 367*     Chemistry Recent Labs  Lab 02/26/22 0452 02/27/22 0435 02/28/22 0414  NA 134* 134* 134*  K 4.2 4.2 4.5  CL 90* 90* 95*  CO2 34* 33* 29  GLUCOSE 195* 202* 213*  BUN 47* 45* 39*  CREATININE 1.35* 1.33* 1.11  CALCIUM 9.6 9.3 9.3  GFRNONAA >60 >60 >60  ANIONGAP '10 11 10    '$ Lipids  Recent Labs  Lab 02/24/22 0614  CHOL 242*  TRIG 235*  HDL 35*  LDLCALC 160*  CHOLHDL 6.9    Hematology Recent Labs  Lab 02/22/22 0459 02/23/22 0410  WBC 7.3 7.3  RBC 4.27 4.34  HGB 13.1 13.3  HCT 40.5 40.8  MCV  94.8 94.0  MCH 30.7 30.6  MCHC 32.3 32.6  RDW 15.9* 15.3  PLT 367 368   Thyroid No results for input(s): "TSH", "FREET4" in the last 168 hours.  BNPNo results for input(s): "BNP", "PROBNP" in the last 168 hours.  DDimer No results for input(s): "DDIMER" in the last 168 hours.   Radiology    No results found.  Cardiac Studies   Echo EF 30 to 35%  LHC March 22, 2022 Conclusions: Severe three-vessel coronary artery disease, including chronic total/subtotal occlusions of large D1 and OM1 branches, mid LAD, and ostial rPDA.  There is also moderate-severe disease involving the proximal and distal LCx as well as the distal RCA. Moderately elevated left heart, right heart, and pulmonary artery pressures (LVEDP 32 mmHg, PCWP 30 mmHg, mean RA 9 mmHg, RVEDP 14 mmHg, and mean PAP 37 mmHg). Low normal Fick cardiac output/index (CO 5.7 L/min, CI 2.5 L/min/m^2).   Recommendations: Escalate diuresis, as filling pressures indicate persistent volume overload.  I will add metolazone 2.5 mg daily to current  regimen of furosemide 80 mg IV twice daily. Escalate goal-directed medical therapy for acute HFrEF due to ischemic cardiomyopathy as blood pressure and renal function allow. Once optimized from a volume standpoint, recommend cardiac MRI to assess for viability to help guide revascularization strategy.  If LAD +/- LCx territory is viable, evaluation cardiac surgery consultation will need to be considered.    Patient Profile     56 y.o. male history of multivessel CAD, HFrEF, obesity, smoker x20+ years presenting with shortness of breath and edema, being seen for acute HFrEF and multivessel CAD  Assessment & Plan    Ischemic cardiomyopathy, EF 30 to 35% -Still volume overload -Continue Lasix 40 mg daily -Jardiance 10 mg daily -Continue digoxin, Toprol-XL 12.5 mg daily, Aldactone 12.'5mg'$  qd -Low blood pressures preventing additional use of GDMT -Outpatient follow-up with advanced heart failure team.  2.  Multivessel CAD (CTO of mLAD, D1, rPDA) -CMR showing anterior wall nonviable, left circumflex and RCA not ideal for intervention -Medical management -Aspirin 81 mg, Lipitor 80 mg  3.  Tobacco use -Cessation recommended  Patient will need assistance regarding affordability for currently prescribed medications upon discharge.  Total encounter time more than 50 minutes  Greater than 50% was spent in counseling and coordination of care with the patient     Signed, Kate Sable, MD  02/28/2022, 12:48 PM

## 2022-02-28 NOTE — Progress Notes (Addendum)
   Heart Failure Nurse Navigator Note  Received secure chat from Dr. Sheppard Coil and pharmacist Nilsa Nutting voicing concerns over the patient not having his Medicaid insurance coverage as of yet.  This patient with severe cardiac disease and risk for frequent readmissions.  Have made patient aware on times  that I visited with him that he has follow-up in the outpatient heart failure clinic on July 26.  Also spoke with Darylene Price in the outpatient heart failure clinic to see if he could be seen by the social worker that comes from [facility], she  is associated with the advanced heart failure clinic.  He was given an appointment for July 21 at 12:30 PM.  Spent over an hour speaking with patient, allowing him to vent, also stressing that his appointments with the clinic are very important. He voices understanding, states his son Ernestine Mcmurray with help with transportation.  He was given written instructions.  Pricilla Riffle RN [facility]

## 2022-02-28 NOTE — TOC Progression Note (Addendum)
Transition of Care [facility]) - Progression Note    Patient Details  Name: Lawrence Murray MRN: [id number] Date of Birth: December 02, 1965  Transition of Care [facility]) CM/SW [place], [place] Phone Number: 02/28/2022, 3:00 PM  Clinical Narrative:     Patient has PCP apt set up at [facility] for Sept 1 at 1:00 pm with Hendricks Milo, NP. Patient will need to confirm apt 24 hours prior, bring ID and proof of address.   This above information was placed on patient's follow up provider list that will be printed with discharge paperwork.    Expected Discharge Plan: Home/Self Care Barriers to Discharge: Continued Medical Work up  Expected Discharge Plan and Services Expected Discharge Plan: Home/Self Care       Living arrangements for the past 2 months: Single Family Home                                       Social Determinants of Health (SDOH) Interventions    Readmission Risk Interventions     No data to display

## 2022-02-28 NOTE — Progress Notes (Signed)
PROGRESS NOTE    Lawrence Murray  UVO:[id number] DOB: [birth year]  DOA: 02/05/2022 Date of Service: 02/28/22 PCP: Kathyrn Lass     Hospital Course:  56 y.o. male who has not seen a doctor in many years, presented 02/05/22 to the hospital because of shortness of breath and significant swelling in the lower extremities. acute on chronic systolic and diastolic CHF with anasarca.  2D echo showed estimated EF of 30 to 74% but LV diastolic function could not be evaluated.  He was treated with IV Lasix.  BP was soft so midodrine was started.  Troponin was elevated but this was attributed to demand ischemia.  Cardiologist recommended left and right heart cath once he is euvolemic. There was concern for peripheral arterial disease.  Patient underwent angiogram with vascular surgery on 6/28.  Good arterial flow noted.  No intervention was indicated.  Volume status is markedly improved.  As of 6/30 patient is 25.5 L net negative.  Symptomatically improved. 7/3-7/4: patient tolerating diuretics well.  7/5: L/R Cardiac Cath (+)severe three-vessel CAD appears chronic. Left and right heart pressures high (LVEDP 32 mmHg). Cardio recommend few more days aggressive diuresis, added metolazone to his scheduled Lasix. Planning MRI viability study  7/7: Cardiac MRI: Severely reduced Bi-ventricular function.  LVEF 16%, RVEF 19%. Findings consistent with ischemic cardiomyopathy with viable LV lateral and inferior walls. Non-viable LV anterior and apical wall. Per cardiology: DC metolazone.  Given MRI findings, minimal benefit from CABG, recommending aggressive medical therapy and no revascularization. 7/8-7/9: Continues to diurese, cardiology following.  7/10: Off IV Lasix, on torsemide 20 mg daily  7/11: furosemide 40 mg daily (cheaper than torsemide), added metoprolol succinate 12.5 mg daily, continue digoxin, dapagliflozin, spironolactone. Per cardiology appears stable. Approaching medical stability 7/12: TOC to arrange  help w/ financial counselor, arrange PCP, get patient set up w/ heart failure clinic (on charity basis given readmission risk?). Awaiting cardiology recs re: discharge readiness but he seems stable to me and we discussed d/c home possible tomorrow    Consultants:  Cardiology   Procedures: 02/14/22 angiogram LE 02/21/22 Left and Right Heart Catheterization w/ Dr End     Subjective: Patient reports overall doing okay, no complaints today other than concerns over financial situations     ASSESSMENT & PLAN:   Principal Problem:   Acute on chronic combined systolic and diastolic CHF (congestive heart failure) ([place]) Active Problems:   Smoker   Type 2 diabetes mellitus with hyperglycemia ([facility])   PVD (peripheral vascular disease) ([place])   Acute HFrEF (heart failure with reduced ejection fraction) ([place])   Coronary artery disease    Acute on chronic systolic and diastolic congestive heart failure Anasarca Net IO Since Admission: -52,334 mL [02/28/22 0741] Plan: furosemide 40 mg daily (cheaper than torsemide), added metoprolol succinate 12.5 mg daily, continue digoxin, dapagliflozin, spironolactone. Per cardiology appears stable. Approaching medical stability Unable to add GDMT at this time d/t low BP  Cardiac MRI 07/07: Severely reduced Bi-ventricular function.  LVEF 16%, RVEF 19%. Findings consistent with ischemic cardiomyopathy with viable LV lateral and inferior walls. Non-viable LV anterior and apical wall.    Coronary artery disease Per Dr End (cardio) 07/05: Cath showed severe three-vessel CAD that appears chronic. Left and right heart pressures high (LVEDP 32 mmHg). Recommend few more days aggressive diuresis, was on metolazone in addition to his scheduled Lasix.  Based on MRI viability study, no CABG revascularization  furosemide 40 mg daily (cheaper than torsemide), added metoprolol succinate 12.5 mg daily, continue  digoxin, dapagliflozin, spironolactone. Per cardiology  appears stable. Awaitng recs re: discharge appropriate hopefully 07/13 or 07/14  Hypotension BP remains soft Continue midodrine 2.5 3 times daily Hold GDMT but eventually plan to be on beta-blocker and ARB   Elevated troponin Likely secondary demand ischemia No indication of ACS   Bilateral pleural effusions Likely secondary to decompensated heart failure Patient on room air, clear lungs   Type 2 diabetes mellitus Hemoglobin A1c 7.7 SSI with NovoLog Continue empagliflozin Consider metformin at discharge   Peripheral vascular disease Peripheral neuropathy Lower extremity angiogram reassuring Gabapentin not effective Seems to have been a result of Lyrica Plan: Continue Lyrica 100 3 times daily Continue Robaxin Avoid narcotics except breakthrough    Anxiety Seroquel as needed qhs for sleep/agitation as needed Xanax   Tobacco use disorder Counseled patient    DVT prophylaxis: lovenox Code Status: FULL Family Communication: spoke w/ mother on the phone 3 days ago, pt  declines call to her today  Disposition Plan / TOC needs: back to home environment. TOC trying to arrange what resources we can for him given his financial situation  Barriers to discharge / significant pending items: approaching medical stability             Objective: Vitals:   02/27/22 1909 02/27/22 2349 02/28/22 0513 02/28/22 0731  BP: 104/73 118/82 112/80 107/69  Pulse: 86 89 98 96  Resp: '19 20 19 16  '$ Temp: 97.7 F (36.5 C) 99.2 F (37.3 C) 98.7 F (37.1 C) 98.9 F (37.2 C)  TempSrc: Oral Oral Oral   SpO2: 100% 100% 97% 99%  Weight:   105.1 kg   Height:        Intake/Output Summary (Last 24 hours) at 02/28/2022 0741 Last data filed at 02/28/2022 1062 Gross per 24 hour  Intake 960 ml  Output 3825 ml  Net -2865 ml    Filed Weights   02/26/22 0629 02/27/22 0500 02/28/22 0513  Weight: 104.4 kg 104.3 kg 105.1 kg    Examination:  Constitutional:  VS as above General  Appearance: alert, well-developed, well-nourished, NAD Respiratory: Normal respiratory effort Cardiovascular: Unable to assess lower extremity edema - both legs bandaged/wrapped  Psychiatric: Normal judgment/insight Normal mood and affect       Scheduled Medications:   aspirin EC  81 mg Oral Daily   atorvastatin  80 mg Oral Daily   digoxin  0.125 mg Oral Daily   empagliflozin  10 mg Oral Daily   enoxaparin (LOVENOX) injection  0.5 mg/kg Subcutaneous Q24H   furosemide  40 mg Oral Daily   insulin aspart  0-15 Units Subcutaneous TID WC   insulin aspart  0-5 Units Subcutaneous QHS   insulin aspart  3 Units Subcutaneous TID WC   methocarbamol  750 mg Oral QID   metoprolol succinate  12.5 mg Oral Daily   midodrine  2.5 mg Oral BID   pantoprazole  40 mg Oral Daily   potassium chloride  20 mEq Oral Daily   pregabalin  100 mg Oral TID   QUEtiapine  25 mg Oral QHS   sodium chloride flush  3 mL Intravenous Q12H   spironolactone  12.5 mg Oral Daily    Continuous Infusions:  sodium chloride      PRN Medications:  sodium chloride, acetaminophen, ALPRAZolam, bisacodyl, ondansetron (ZOFRAN) IV, ondansetron (ZOFRAN) IV, oxyCODONE, sodium chloride flush  Antimicrobials:  Anti-infectives (From admission, onward)    Start     Dose/Rate Route Frequency Ordered Stop   02/14/22  1156  ceFAZolin (ANCEF) IVPB 1 g/50 mL premix        over 30 Minutes  Continuous PRN 02/14/22 1200 02/14/22 1328   02/14/22 1018  ceFAZolin (ANCEF) IVPB 2g/100 mL premix  Status:  Discontinued        2 g 200 mL/hr over 30 Minutes Intravenous 30 min pre-op 02/14/22 1018 02/14/22 1328   02/13/22 1816  ceFAZolin (ANCEF) IVPB 2g/100 mL premix  Status:  Discontinued        2 g 200 mL/hr over 30 Minutes Intravenous 30 min pre-op 02/13/22 1816 02/14/22 1023       Data Reviewed: I have personally reviewed following labs and imaging studies  CBC: Recent Labs  Lab 02/22/22 0459 02/23/22 0410  WBC 7.3 7.3  HGB  13.1 13.3  HCT 40.5 40.8  MCV 94.8 94.0  PLT 367 626    Basic Metabolic Panel: Recent Labs  Lab 02/24/22 0614 02/25/22 0556 02/26/22 0452 02/27/22 0435 02/28/22 0414  NA 132* 132* 134* 134* 134*  K 3.8 3.9 4.2 4.2 4.5  CL 84* 88* 90* 90* 95*  CO2 36* 34* 34* 33* 29  GLUCOSE 205* 220* 195* 202* 213*  BUN 50* 45* 47* 45* 39*  CREATININE 1.47* 1.17 1.35* 1.33* 1.11  CALCIUM 9.5 9.4 9.6 9.3 9.3    GFR: Estimated Creatinine Clearance: 92.3 mL/min (by C-G formula based on SCr of 1.11 mg/dL). Liver Function Tests: No results for input(s): "AST", "ALT", "ALKPHOS", "BILITOT", "PROT", "ALBUMIN" in the last 168 hours. No results for input(s): "LIPASE", "AMYLASE" in the last 168 hours. No results for input(s): "AMMONIA" in the last 168 hours. Coagulation Profile: No results for input(s): "INR", "PROTIME" in the last 168 hours. Cardiac Enzymes: No results for input(s): "CKTOTAL", "CKMB", "CKMBINDEX", "TROPONINI" in the last 168 hours. BNP (last 3 results) No results for input(s): "PROBNP" in the last 8760 hours. HbA1C: No results for input(s): "HGBA1C" in the last 72 hours. CBG: Recent Labs  Lab 02/27/22 0818 02/27/22 1155 02/27/22 1624 02/27/22 1954 02/28/22 0731  GLUCAP 220* 246* 174* 202* 193*    Lipid Profile: No results for input(s): "CHOL", "HDL", "LDLCALC", "TRIG", "CHOLHDL", "LDLDIRECT" in the last 72 hours.  Thyroid Function Tests: No results for input(s): "TSH", "T4TOTAL", "FREET4", "T3FREE", "THYROIDAB" in the last 72 hours. Anemia Panel: No results for input(s): "VITAMINB12", "FOLATE", "FERRITIN", "TIBC", "IRON", "RETICCTPCT" in the last 72 hours. Urine analysis: No results found for: "COLORURINE", "APPEARANCEUR", "LABSPEC", "PHURINE", "GLUCOSEU", "HGBUR", "BILIRUBINUR", "KETONESUR", "PROTEINUR", "UROBILINOGEN", "NITRITE", "LEUKOCYTESUR" Sepsis Labs: '@LABRCNTIP'$ (procalcitonin:4,lacticidven:4)  No results found for this or any previous visit (from the past  240 hour(s)).        Radiology Studies last 96 hours: DG Chest Port 1 View  Result Date: 02/25/2022 CLINICAL DATA:  Left upper anterior chest pain. EXAM: PORTABLE CHEST 1 VIEW COMPARISON:  Chest radiograph 02/07/2022 FINDINGS: Bilateral pleural effusions have diminished, small volume of pleural fluid persists. Cardiomegaly is similar. No acute airspace disease. No pneumothorax. Slight vascular congestion without pulmonary edema. IMPRESSION: 1. Decreased bilateral pleural effusions from prior exam, small bilateral pleural effusions persist. 2. Cardiomegaly with mild vascular congestion. Electronically Signed   By: Keith Rake M.D.   On: 02/25/2022 18:43            LOS: 23 days     Emeterio Reeve, DO [facility] 02/28/2022, 7:41 AM   Staff may message me via secure chat in [place]  but this may not receive immediate response,  please page for urgent matters!  If 7PM-7AM, please contact night-coverage [email or web address]  Dictation software was used to generate the above note. Typos may occur and escape review, as with typed/written notes. Please contact Dr Sheppard Coil directly for clarity if needed.

## 2022-03-01 ENCOUNTER — Other Ambulatory Visit: Payer: Self-pay

## 2022-03-01 ENCOUNTER — Telehealth: Payer: Self-pay | Admitting: Cardiology

## 2022-03-01 DIAGNOSIS — I4892 Unspecified atrial flutter: Secondary | ICD-10-CM | POA: Diagnosis not present

## 2022-03-01 DIAGNOSIS — I25118 Atherosclerotic heart disease of native coronary artery with other forms of angina pectoris: Secondary | ICD-10-CM | POA: Diagnosis not present

## 2022-03-01 DIAGNOSIS — I5021 Acute systolic (congestive) heart failure: Secondary | ICD-10-CM | POA: Diagnosis not present

## 2022-03-01 DIAGNOSIS — I248 Other forms of acute ischemic heart disease: Secondary | ICD-10-CM | POA: Diagnosis not present

## 2022-03-01 DIAGNOSIS — I5043 Acute on chronic combined systolic (congestive) and diastolic (congestive) heart failure: Secondary | ICD-10-CM | POA: Diagnosis not present

## 2022-03-01 DIAGNOSIS — I739 Peripheral vascular disease, unspecified: Secondary | ICD-10-CM | POA: Diagnosis not present

## 2022-03-01 DIAGNOSIS — R739 Hyperglycemia, unspecified: Secondary | ICD-10-CM | POA: Diagnosis not present

## 2022-03-01 DIAGNOSIS — I2510 Atherosclerotic heart disease of native coronary artery without angina pectoris: Secondary | ICD-10-CM | POA: Diagnosis not present

## 2022-03-01 DIAGNOSIS — E1165 Type 2 diabetes mellitus with hyperglycemia: Secondary | ICD-10-CM | POA: Diagnosis not present

## 2022-03-01 DIAGNOSIS — I420 Dilated cardiomyopathy: Secondary | ICD-10-CM | POA: Diagnosis not present

## 2022-03-01 LAB — BASIC METABOLIC PANEL
Anion gap: 10 (ref 5–15)
BUN: 32 mg/dL — ABNORMAL HIGH (ref 6–20)
CO2: 28 mmol/L (ref 22–32)
Calcium: 9 mg/dL (ref 8.9–10.3)
Chloride: 95 mmol/L — ABNORMAL LOW (ref 98–111)
Creatinine, Ser: 1.14 mg/dL (ref 0.61–1.24)
GFR, Estimated: >60 mL/min (ref >60)
Glucose, Bld: 155 mg/dL — ABNORMAL HIGH (ref 70–99)
Potassium: 4.5 mmol/L (ref 3.5–5.1)
Sodium: 133 mmol/L — ABNORMAL LOW (ref 135–145)

## 2022-03-01 LAB — GLUCOSE, CAPILLARY
Glucose-Capillary: 158 mg/dL — ABNORMAL HIGH (ref 70–99)
Glucose-Capillary: 138 mg/dL — ABNORMAL HIGH (ref 70–99)

## 2022-03-01 MED ORDER — METFORMIN HCL 500 MG PO TABS
ORAL_TABLET | ORAL | 0 refills | Status: DC
  Filled 2022-03-01: qty 53, 30d supply, fill #0

## 2022-03-01 MED ORDER — PANTOPRAZOLE SODIUM 40 MG PO TBEC
40.0000 mg | DELAYED_RELEASE_TABLET | Freq: Every day | ORAL | 0 refills | Status: DC
  Filled 2022-03-01: qty 30, 30d supply, fill #0

## 2022-03-01 MED ORDER — FUROSEMIDE 40 MG PO TABS
40.0000 mg | ORAL_TABLET | Freq: Every day | ORAL | 0 refills | Status: DC
  Filled 2022-03-01: qty 30, 30d supply, fill #0

## 2022-03-01 MED ORDER — QUETIAPINE FUMARATE 25 MG PO TABS
25.0000 mg | ORAL_TABLET | Freq: Every day | ORAL | 0 refills | Status: DC
  Filled 2022-03-01: qty 30, 30d supply, fill #0

## 2022-03-01 MED ORDER — BISACODYL 5 MG PO TBEC: 10.0000 mg | DELAYED_RELEASE_TABLET | Freq: Every day | ORAL | 0 refills | Status: DC | PRN

## 2022-03-01 MED ORDER — POTASSIUM CHLORIDE CRYS ER 20 MEQ PO TBCR
20.0000 meq | EXTENDED_RELEASE_TABLET | Freq: Every day | ORAL | 0 refills | Status: DC
  Filled 2022-03-01: qty 30, 30d supply, fill #0

## 2022-03-01 MED ORDER — ASPIRIN 81 MG PO TBEC
81.0000 mg | DELAYED_RELEASE_TABLET | Freq: Every day | ORAL | 12 refills | Status: DC
  Filled 2022-03-01: qty 30, 30d supply, fill #0
  Filled 2022-04-17 – 2022-04-18 (×3): qty 30, 30d supply, fill #1
  Filled 2022-05-21: qty 30, 30d supply, fill #2

## 2022-03-01 MED ORDER — METHOCARBAMOL 750 MG PO TABS
750.0000 mg | ORAL_TABLET | Freq: Four times a day (QID) | ORAL | 0 refills | Status: DC | PRN
  Filled 2022-03-01: qty 120, 30d supply, fill #0

## 2022-03-01 MED ORDER — DIGOXIN 125 MCG PO TABS
0.1250 mg | ORAL_TABLET | Freq: Every day | ORAL | 0 refills | Status: DC
  Filled 2022-03-01: qty 30, 30d supply, fill #0

## 2022-03-01 MED ORDER — ACETAMINOPHEN 500 MG PO TABS: 1000.0000 mg | ORAL_TABLET | Freq: Four times a day (QID) | ORAL | 0 refills | Status: DC | PRN

## 2022-03-01 MED ORDER — METOPROLOL SUCCINATE ER 25 MG PO TB24
12.5000 mg | ORAL_TABLET | Freq: Every day | ORAL | 0 refills | Status: DC
  Filled 2022-03-01: qty 30, 60d supply, fill #0

## 2022-03-01 MED ORDER — EMPAGLIFLOZIN 10 MG PO TABS
10.0000 mg | ORAL_TABLET | Freq: Every day | ORAL | 0 refills | Status: DC
  Filled 2022-03-01: qty 30, 30d supply, fill #0

## 2022-03-01 MED ORDER — ATORVASTATIN CALCIUM 80 MG PO TABS
80.0000 mg | ORAL_TABLET | Freq: Every day | ORAL | 0 refills | Status: DC
  Filled 2022-03-01: qty 30, 30d supply, fill #0

## 2022-03-01 MED ORDER — SPIRONOLACTONE 25 MG PO TABS
12.5000 mg | ORAL_TABLET | Freq: Every day | ORAL | 0 refills | Status: DC
  Filled 2022-03-01: qty 15, 30d supply, fill #0

## 2022-03-01 MED ORDER — PREGABALIN 100 MG PO CAPS
100.0000 mg | ORAL_CAPSULE | Freq: Three times a day (TID) | ORAL | 0 refills | Status: DC
  Filled 2022-03-01 – 2022-03-02 (×2): qty 90, 30d supply, fill #0

## 2022-03-01 MED ORDER — DAPAGLIFLOZIN PROPANEDIOL 10 MG PO TABS
10.0000 mg | ORAL_TABLET | Freq: Every day | ORAL | 0 refills | Status: DC
  Filled 2022-03-01: qty 30, 30d supply, fill #0

## 2022-03-01 NOTE — Progress Notes (Addendum)
See d/c summary

## 2022-03-01 NOTE — Progress Notes (Signed)
Progress Note  Patient Name: Lawrence Murray Date of Encounter: 03/01/2022  [facility] Cardiologist: Kate Sable, MD   Subjective   Long discussion with him today concerning hospitalization, psychosocial issues at home Working on getting disability, ran into some issues with his name attached to his father's banking accounts Leg wraps off today, still with swelling Has not been ambulating very much Set up to be seen by CHF clinic and social work, has appointments also with [place] [facility]  Inpatient Medications    Scheduled Meds:  aspirin EC  81 mg Oral Daily   atorvastatin  80 mg Oral Daily   digoxin  0.125 mg Oral Daily   empagliflozin  10 mg Oral Daily   enoxaparin (LOVENOX) injection  0.5 mg/kg Subcutaneous Q24H   furosemide  40 mg Oral Daily   insulin aspart  0-15 Units Subcutaneous TID WC   insulin aspart  0-5 Units Subcutaneous QHS   insulin aspart  5 Units Subcutaneous TID WC   methocarbamol  750 mg Oral QID   metoprolol succinate  12.5 mg Oral Daily   pantoprazole  40 mg Oral Daily   potassium chloride  20 mEq Oral Daily   pregabalin  100 mg Oral TID   QUEtiapine  25 mg Oral QHS   sodium chloride flush  3 mL Intravenous Q12H   spironolactone  12.5 mg Oral Daily   Continuous Infusions:  sodium chloride     PRN Meds: sodium chloride, acetaminophen, ALPRAZolam, bisacodyl, ondansetron (ZOFRAN) IV, ondansetron (ZOFRAN) IV, oxyCODONE, sodium chloride flush   Vital Signs    Vitals:   02/28/22 1600 02/28/22 2329 03/01/22 0409 03/01/22 0723  BP: 108/65 96/64 103/62 93/62  Pulse: 92 87 94 89  Resp: '17 20 20 18  '$ Temp: 99 F (37.2 C) 98.4 F (36.9 C) 98.2 F (36.8 C) 98.1 F (36.7 C)  TempSrc:  Oral    SpO2: 98% 99% 99% 98%  Weight:      Height:        Intake/Output Summary (Last 24 hours) at 03/01/2022 1206 Last data filed at 03/01/2022 0725 Gross per 24 hour  Intake --  Output 2600 ml  Net -2600 ml      02/28/2022    5:13 AM 02/27/2022    5:00  AM 02/26/2022    6:29 AM  Last 3 Weights  Weight (lbs) 231 lb 9.6 oz 229 lb 15 oz 230 lb 3.2 oz  Weight (kg) 105.053 kg 104.3 kg 104.418 kg      Telemetry    Normal sinus rhythm- Personally Reviewed  ECG     - Personally Reviewed  Physical Exam   GEN: No acute distress.   Neck: Unable to estimate JVD Cardiac: RRR, no murmurs, rubs, or gallops.  1+ pitting lower extremity edema Respiratory: Clear to auscultation bilaterally. GI: Soft, nontender, non-distended  MS:  No deformity. Neuro:  Nonfocal  Psych: Normal affect   Labs    High Sensitivity Troponin:   Recent Labs  Lab 02/05/22 0218 02/05/22 0403  TROPONINIHS 370* 367*     Chemistry Recent Labs  Lab 02/27/22 0435 02/28/22 0414 03/01/22 0546  NA 134* 134* 133*  K 4.2 4.5 4.5  CL 90* 95* 95*  CO2 33* 29 28  GLUCOSE 202* 213* 155*  BUN 45* 39* 32*  CREATININE 1.33* 1.11 1.14  CALCIUM 9.3 9.3 9.0  GFRNONAA >60 >60 >60  ANIONGAP '11 10 10    '$ Lipids  Recent Labs  Lab 02/24/22 [phone]  CHOL 242*  TRIG 235*  HDL 35*  LDLCALC 160*  CHOLHDL 6.9    Hematology Recent Labs  Lab 02/23/22 0410  WBC 7.3  RBC 4.34  HGB 13.3  HCT 40.8  MCV 94.0  MCH 30.6  MCHC 32.6  RDW 15.3  PLT 368   Thyroid No results for input(s): "TSH", "FREET4" in the last 168 hours.  BNPNo results for input(s): "BNP", "PROBNP" in the last 168 hours.  DDimer No results for input(s): "DDIMER" in the last 168 hours.   Radiology    No results found.  Cardiac Studies  Cardiac catheterization Severe three-vessel coronary artery disease, including chronic total/subtotal occlusions of large D1 and OM1 branches, mid LAD, and ostial rPDA.  There is also moderate-severe disease involving the proximal and distal LCx as well as the distal RCA. Moderately elevated left heart, right heart, and pulmonary artery pressures (LVEDP 32 mmHg, PCWP 30 mmHg, mean RA 9 mmHg, RVEDP 14 mmHg, and mean PAP 37 mmHg). Low normal Fick cardiac output/index  (CO 5.7 L/min, CI 2.5 L/min/m^2).   Recommendations: Escalate diuresis, as filling pressures indicate persistent volume overload.  I will add metolazone 2.5 mg daily to current regimen of furosemide 80 mg IV twice daily. Escalate goal-directed medical therapy for acute HFrEF due to ischemic cardiomyopathy as blood pressure and renal function allow. Once optimized from a volume standpoint, recommend cardiac MRI to assess for viability to help guide revascularization strategy.  If LAD +/- LCx territory is viable, evaluation cardiac surgery consultation will need to be considered.   Patient Profile     56 y.o. male history of multivessel CAD, HFrEF, obesity, smoker x20+ years presenting with shortness of breath and edema, being seen for acute HFrEF and multivessel CAD  Assessment & Plan    Acute on chronic diastolic and systolic CHF Has had aggressive diuresis this admission with 30 to 50 L negative Still with lower extremity swelling but dramatic improvement He still is not taking much ownership for his condition He has numerous complaints, discussed today Recommend we continue Lasix 40 daily Jardiance 10 daily digoxin, metoprolol succinate 12.5 daily spironolactone 12.5 daily  Ischemic cardiomyopathy Multivessel CAD (CTO of mLAD, D1, rPDA) -CMR showing anterior wall nonviable, left circumflex and RCA not ideal for intervention -Medical management recommended, this was discussed with him Aspirin, statin/Lipitor 80 daily  Smoker Cessation recommended  Numerous complaints discussed, reassurance provided Discussed need for appointment and medication compliance Total encounter time more than 50 minutes  Greater than 50% was spent in counseling and coordination of care with the patient   For questions or updates, please contact [facility] Please consult [email or web address] for contact info under        Signed, Ida Rogue, MD  03/01/2022, 12:06 PM

## 2022-03-01 NOTE — Telephone Encounter (Signed)
-----   Message from Clarisse Gouge sent at 03/01/2022  2:21 PM EDT ----- Regarding: FW: hospital follow up  ----- Message ----- From: Gerrie Nordmann, NP Sent: 03/01/2022   2:08 PM EDT To: [facility] Triage; [facility] Scheduling Subject: hospital follow up                             Please schedule hospital follow-up in 4-5 weeks Thank you

## 2022-03-01 NOTE — Discharge Summary (Signed)
Physician Discharge Summary   Patient: Lawrence Murray MRN: [id number]  DOB: 11/28/1965   Admit:     Date of Admission: 02/05/2022 Admitted from: home   Discharge: Date of discharge: 03/01/22 Disposition: Home Condition at discharge: good  CODE STATUS: FULL   Diet recommendation: Cardiac and Carb modified diet   Discharge Physician: Emeterio Reeve, DO [facility]     PCP: Pcp, No  Recommendations for Outpatient Follow-up:  Follow up with PCP  as below Please obtain labs/tests: BMP, CBC  Please follow up on the following pending results: none Other appointments as below   Outpatient Follow up resources:  Patient has PCP apt set up at [facility] for Sept 1 at 1:00 pm with Hendricks Milo, NP. [phone]. Address: [address] [place], [place]. Patient will need to confirm apt 24 hours prior, bring ID and proof of address.  Social worker appointment (arranged by [facility]) for July 21 at 12:00 PM at the heart failure clinic.  Outpatient heart failure clinic appointment on July 26. [phone] Located in [place].   Heart Failure RN will be calling patient 3 days after discharge.   Discharge Instructions     Diet - low sodium heart healthy   Complete by: As directed    Diet Carb Modified   Complete by: As directed    Discharge instructions   Complete by: As directed    Outpatient Follow up resources:   Patient has PCP apt set up at [facility] for Sept 1 at 1:00 pm with Hendricks Milo, NP. [phone]. Address: [address], [place], [place]. Patient will need to confirm apt 24 hours prior, bring ID and proof of address.   Social worker appointment (arranged by [facility]) for July 21 at 12:00 PM at the heart failure clinic.   Outpatient heart failure clinic appointment on July 26. [phone] Located in [place].    Heart Failure RN will be calling patient 3 days after discharge.   Discharge  wound care:   Complete by: As directed    Wash with soap and water, pat dry. Place as many Xeroform gauzes Kellie Simmering [phone]) as needed to cover wounds/weeping areas. Then top with ABD pads. Beginning behind the toes and going to just below the knees, spiral wrap kerlix, then 4 inch ace wrap Kellie Simmering [phone]). Perform daily / every other day.   Increase activity slowly   Complete by: As directed             Hospital Course:  56 y.o. male who has not seen a doctor in many years, presented 02/05/22 to the hospital because of shortness of breath and significant swelling in the lower extremities. acute on chronic systolic and diastolic CHF with anasarca.  2D echo showed estimated EF of 30 to 59% but LV diastolic function could not be evaluated.  He was treated with IV Lasix.  BP was soft so midodrine was started.  Troponin was elevated but this was attributed to demand ischemia.  Cardiologist recommended left and right heart cath once he is euvolemic. There was concern for peripheral arterial disease.  Patient underwent angiogram with vascular surgery on 6/28.  Good arterial flow noted.  No intervention was indicated.  Volume status is markedly improved.  As of 6/30 patient is 55.5 L net negative.  Symptomatically improved. 7/3-7/4: patient tolerating diuretics well.  7/5: L/R Cardiac Cath (+)severe three-vessel CAD appears chronic. Left and right  heart pressures high (LVEDP 32 mmHg). Cardio recommend few more days aggressive diuresis, added metolazone to his scheduled Lasix. Planning MRI viability study  7/7: Cardiac MRI: Severely reduced Bi-ventricular function.  LVEF 16%, RVEF 19%. Findings consistent with ischemic cardiomyopathy with viable LV lateral and inferior walls. Non-viable LV anterior and apical wall. Per cardiology: DC metolazone.  Given MRI findings, minimal benefit from CABG, recommending aggressive medical therapy and no revascularization. 7/8-7/9: Continues to diurese, cardiology  following.  7/10: Off IV Lasix, on torsemide 20 mg daily  7/11: furosemide 40 mg daily (cheaper than torsemide), added metoprolol succinate 12.5 mg daily, continue digoxin, dapagliflozin, spironolactone. Per cardiology appears stable. Approaching medical stability 7/12: TOC to arrange help w/ financial counselor, arrange PCP, get patient set up w/ heart failure clinic (on charity basis given readmission risk?). Cardiology reports ok for discharge from their standpoint.  7/13: Stable for discharge home. Meds to bedside. Appointments arranged as above.    Consultants:  Cardiology   Procedures: 02/14/22 angiogram LE 02/21/22 Left and Right Heart Catheterization w/ Dr End      Discharge Diagnoses: Principal Problem:   Acute on chronic combined systolic and diastolic CHF (congestive heart failure) (HCC) Active Problems:   Smoker   Type 2 diabetes mellitus with hyperglycemia (HCC)   PVD (peripheral vascular disease) (HCC)   Acute HFrEF (heart failure with reduced ejection fraction) (HCC)   Coronary artery disease    Assessment & Plan:  Acute on chronic systolic and diastolic congestive heart failure Anasarca Net IO Since Admission: -56,069 mL [03/01/22 1240] Plan: furosemide 40 mg daily (cheaper than torsemide), added metoprolol succinate 12.5 mg daily, continue digoxin, dapagliflozin, spironolactone. Per cardiology appears stable and ok for discharge based on my conversation w/ Dr. Garen Lah 07/12 Unable to add GDMT at this time d/t low BP, will follow outpatient.  Cardiac MRI 07/07: Severely reduced Bi-ventricular function.  LVEF 16%, RVEF 19%. Findings consistent with ischemic cardiomyopathy with viable LV lateral and inferior walls. Non-viable LV anterior and apical wall. No interventions recommended.    Coronary artery disease Per Dr End (cardio) 07/05: Cath showed severe three-vessel CAD that appears chronic. Left and right heart pressures high (LVEDP 32 mmHg). Recommend few  more days aggressive diuresis, was on metolazone in addition to his scheduled Lasix.  Based on MRI viability study, no CABG revascularization  furosemide 40 mg daily (cheaper than torsemide), added metoprolol succinate 12.5 mg daily, continue digoxin, dapagliflozin, spironolactone. Per cardiology appears stable and ok for discharge based on my conversation w/ Dr. Garen Lah 07/12  Hypotension BP remains soft Continue midodrine 2.5 3 times daily Hold GDMT but eventually plan to be on beta-blocker and ARB   Elevated troponin Likely secondary demand ischemia No indication of ACS   Bilateral pleural effusions Likely secondary to decompensated heart failure Patient on room air, clear lungs   Type 2 diabetes mellitus Hemoglobin A1c 7.7 SSI with NovoLog Continue empagliflozin Consider metformin at discharge   Peripheral vascular disease Peripheral neuropathy Lower extremity angiogram reassuring Gabapentin not effective Seems to have been a result of Lyrica Plan: Continue Lyrica 100 3 times daily Continue Robaxin Avoid narcotics except breakthrough    Anxiety Seroquel as needed qhs for sleep/agitation   Tobacco use disorder Counseled patient    Discharge Instructions  Allergies as of 03/01/2022   No Known Allergies      Medication List     TAKE these medications    acetaminophen 500 MG tablet Commonly known as: TYLENOL Take 2 tablets (1,000  mg total) by mouth every 6 (six) hours as needed for mild pain, moderate pain, fever or headache.   aspirin EC 81 MG tablet Take 1 tablet (81 mg total) by mouth daily. Swallow whole. Start taking on: March 02, 2022   atorvastatin 80 MG tablet Commonly known as: LIPITOR Take 1 tablet (80 mg total) by mouth daily. Start taking on: March 02, 2022   bisacodyl 5 MG EC tablet Commonly known as: DULCOLAX Take 2 tablets (10 mg total) by mouth daily as needed for moderate constipation.   dapagliflozin propanediol 10 MG Tabs  tablet Commonly known as: Farxiga Take 1 tablet (10 mg total) by mouth daily.   digoxin 0.125 MG tablet Commonly known as: LANOXIN Take 1 tablet (0.125 mg total) by mouth daily. Start taking on: March 02, 2022   furosemide 40 MG tablet Commonly known as: LASIX Take 1 tablet (40 mg total) by mouth daily. Start taking on: March 02, 2022   metFORMIN 500 MG tablet Commonly known as: Glucophage Take 1 tablet (500 mg total) by mouth daily with breakfast for 7 days, THEN 1 tablet (500 mg total) 2 (two) times daily with a meal for 23 days. Start taking on: March 01, 2022   methocarbamol 750 MG tablet Commonly known as: ROBAXIN Take 1 tablet (750 mg total) by mouth every 6 (six) hours as needed for muscle spasms.   metoprolol succinate 25 MG 24 hr tablet Commonly known as: TOPROL-XL Take 0.5 tablets (12.5 mg total) by mouth daily. Start taking on: March 02, 2022   pantoprazole 40 MG tablet Commonly known as: PROTONIX Take 1 tablet (40 mg total) by mouth daily. Start taking on: March 02, 2022   potassium chloride SA 20 MEQ tablet Commonly known as: KLOR-CON M Take 1 tablet (20 mEq total) by mouth daily. Start taking on: March 02, 2022   pregabalin 100 MG capsule Commonly known as: LYRICA Take 1 capsule (100 mg total) by mouth 3 (three) times daily.   QUEtiapine 25 MG tablet Commonly known as: SEROQUEL Take 1 tablet (25 mg total) by mouth at bedtime.   spironolactone 25 MG tablet Commonly known as: ALDACTONE Take 0.5 tablets (12.5 mg total) by mouth daily. Start taking on: March 02, 2022               Discharge Care Instructions  (From admission, onward)           Start     Ordered   03/01/22 0000  Discharge wound care:       Comments: Wash with soap and water, pat dry. Place as many Xeroform gauzes Kellie Simmering [phone]) as needed to cover wounds/weeping areas. Then top with ABD pads. Beginning behind the toes and going to just below the knees, spiral wrap kerlix, then 4  inch ace wrap Kellie Simmering [phone]). Perform daily / every other day.   03/01/22 [place], [facility]. Go on 04/20/2022.   Specialty: General Practice Why: Please go to [facility] on Sept 1 at 1:00 pm for your PCP appointment with Hendricks Milo NP, please confirm your appointment 24 hours prior. Please bring ID and proof of address. Contact information: [place] [address] [phone]                 No Known Allergies   Subjective: No CP/SOB, concerns for skin on legs but states  this is looking better.    Discharge Exam: Vitals:   03/01/22 0723 03/01/22 1210  BP: 93/62 112/70  Pulse: 89 97  Resp: 18 (!) 21  Temp: 98.1 F (36.7 C) 98.2 F (36.8 C)  SpO2: 98% 98%   General: Pt is alert, awake, not in acute distress Cardiovascular: RRR, S1/S2 +, no rubs, no gallops Respiratory: CTA bilaterally, no wheezing, no rhonchi, no rales  Abdominal: Soft, NT, ND, bowel sounds + Extremities: (+)1-2 LE edema worse at dorsum of feet, no cyanosis Skin LE: yellowish crusting from dressing but no redness/erythema     The results of significant diagnostics from this hospitalization (including imaging, microbiology, ancillary and laboratory) are listed below for reference.     Microbiology: No results found for this or any previous visit (from the past 240 hour(s)).   Labs: BNP (last 3 results) Recent Labs    02/05/22 0218  BNP 919.6*    Basic Metabolic Panel: Recent Labs  Lab 02/25/22 0556 02/26/22 0452 02/27/22 0435 02/28/22 0414 03/01/22 0546  NA 132* 134* 134* 134* 133*  K 3.9 4.2 4.2 4.5 4.5  CL 88* 90* 90* 95* 95*  CO2 34* 34* 33* 29 28  GLUCOSE 220* 195* 202* 213* 155*  BUN 45* 47* 45* 39* 32*  CREATININE 1.17 1.35* 1.33* 1.11 1.14  CALCIUM 9.4 9.6 9.3 9.3 9.0    Liver Function Tests: No results for input(s): "AST", "ALT", "ALKPHOS", "BILITOT", "PROT", "ALBUMIN"  in the last 168 hours. No results for input(s): "LIPASE", "AMYLASE" in the last 168 hours. No results for input(s): "AMMONIA" in the last 168 hours. CBC: Recent Labs  Lab 02/23/22 0410  WBC 7.3  HGB 13.3  HCT 40.8  MCV 94.0  PLT 368    Cardiac Enzymes: No results for input(s): "CKTOTAL", "CKMB", "CKMBINDEX", "TROPONINI" in the last 168 hours. BNP: Invalid input(s): "POCBNP" CBG: Recent Labs  Lab 02/28/22 1116 02/28/22 1602 02/28/22 2117 03/01/22 0722 03/01/22 1140  GLUCAP 154* 192* 207* 158* 138*    D-Dimer No results for input(s): "DDIMER" in the last 72 hours. Hgb A1c No results for input(s): "HGBA1C" in the last 72 hours. Lipid Profile No results for input(s): "CHOL", "HDL", "LDLCALC", "TRIG", "CHOLHDL", "LDLDIRECT" in the last 72 hours. Thyroid function studies No results for input(s): "TSH", "T4TOTAL", "T3FREE", "THYROIDAB" in the last 72 hours.  Invalid input(s): "FREET3" Anemia work up No results for input(s): "VITAMINB12", "FOLATE", "FERRITIN", "TIBC", "IRON", "RETICCTPCT" in the last 72 hours. Urinalysis No results found for: "COLORURINE", "APPEARANCEUR", "LABSPEC", "PHURINE", "GLUCOSEU", "HGBUR", "BILIRUBINUR", "KETONESUR", "PROTEINUR", "UROBILINOGEN", "NITRITE", "LEUKOCYTESUR" Sepsis Labs Recent Labs  Lab 02/23/22 0410  WBC 7.3    Microbiology No results found for this or any previous visit (from the past 240 hour(s)). Imaging DG Chest 2 View  Result Date: 02/07/2022 CLINICAL DATA:  Pleural effusion EXAM: CHEST - 2 VIEW COMPARISON:  02/05/2022 FINDINGS: Persistent bilateral pleural effusions with adjacent atelectasis. Cardiomediastinal contours are partially obscured. IMPRESSION: Similar bilateral pleural effusions with adjacent atelectasis. Electronically Signed   By: Macy Mis M.D.   On: 02/07/2022 11:33   ECHOCARDIOGRAM COMPLETE  Result Date: 02/06/2022    ECHOCARDIOGRAM REPORT   Patient Name:   Kaitlyn Romanek Date of Exam: 02/06/2022 Medical  Rec #:  [id number]    Height:       70.0 in Accession #:    [id number]   Weight:       320.0 lb Date of Birth:  06/20/1966   BSA:  2.549 m Patient Age:    51 years     BP:           111/74 mmHg Patient Gender: M            HR:           93 bpm. Exam Location:  [facility] Procedure: 2D Echo and Cardiac Doppler Indications:     CHF I50.9  History:         Patient has no prior history of Echocardiogram examinations.  Sonographer:     Sherrie Sport Referring Phys:  [place] Diagnosing Phys: Donnelly Angelica  Sonographer Comments: Technically challenging study due to limited acoustic windows and no apical window. IMPRESSIONS  1. Left ventricular ejection fraction, by estimation, is 30 to 35%. The left ventricle has moderately decreased function. The left ventricle demonstrates global hypokinesis. Left ventricular diastolic function could not be evaluated.  2. RV not well visualized but likely enlarged.  3. Large pleural effusion.  4. The mitral valve is normal in structure. Mild mitral valve regurgitation.  5. The aortic valve is normal in structure. Aortic valve regurgitation is not visualized. Conclusion(s)/Recommendation(s): Limited study, very technically challenging. Likey left pleural effusion, less likely large posterior pericardial effusion. FINDINGS  Left Ventricle: Left ventricular ejection fraction, by estimation, is 30 to 35%. The left ventricle has moderately decreased function. The left ventricle demonstrates global hypokinesis. The left ventricular internal cavity size was normal in size. There is no left ventricular hypertrophy. Left ventricular diastolic function could not be evaluated. Right Ventricle: RV not well visualized but likely enlarged. Left Atrium: Left atrial size was not assessed. Right Atrium: Right atrial size was not assessed. Pericardium: There is no evidence of pericardial effusion. Mitral Valve: The mitral valve is normal in structure. Mild mitral valve regurgitation.  Tricuspid Valve: The tricuspid valve is normal in structure. Tricuspid valve regurgitation is mild. Aortic Valve: The aortic valve is normal in structure. Aortic valve regurgitation is not visualized. Pulmonic Valve: The pulmonic valve was not well visualized. Pulmonic valve regurgitation is not visualized. Aorta: The aortic root and ascending aorta are structurally normal, with no evidence of dilitation. Pulmonary Artery: Probably dilated, but not fully characterized. Additional Comments: There is a large pleural effusion.  LEFT VENTRICLE PLAX 2D LVIDd:         5.50 cm LVIDs:         4.00 cm LV PW:         0.90 cm LV IVS:        1.10 cm LVOT diam:     2.00 cm LVOT Area:     3.14 cm  LEFT ATRIUM         Index LA diam:    5.30 cm 2.08 cm/m   AORTA Ao Root diam: 3.33 cm  SHUNTS Systemic Diam: 2.00 cm Donnelly Angelica Electronically signed by Donnelly Angelica Signature Date/Time: 02/06/2022/2:02:33 PM    Final       Time coordinating discharge: Over 30 minutes  SIGNED:  Emeterio Reeve DO [facility]

## 2022-03-01 NOTE — Telephone Encounter (Signed)
LMOV  

## 2022-03-02 ENCOUNTER — Other Ambulatory Visit: Payer: Self-pay

## 2022-03-02 ENCOUNTER — Other Ambulatory Visit: Payer: Self-pay | Admitting: Pharmacy Technician

## 2022-03-02 NOTE — Patient Outreach (Signed)
Patient only signed [facility] Attestation.  Would need to provide current year's household income if PAP medications were needed.  Jacquelynn Cree Patient Advocate Specialist [place]

## 2022-03-06 ENCOUNTER — Telehealth: Payer: Self-pay

## 2022-03-06 NOTE — Telephone Encounter (Signed)
Post hospital discharge heart failure phone call.  Called to patient at his mother's number which is [phone].  Patient states that he initially had gone home to his father's house where he resides but he he was unable to take care of himself after discharge and his mother had a handicapped shower so it was easier for him to stay with her.  She has been helping with doing dressing changes.  Patient states that he was only given 1 day supply of Xeroform, ABD pad and spiral wrap Kerlix.  His instructions say that he has performed this every other day to both lower extremities.  Spoke with Colletta Maryland, the charge nurse on 2A.  She will help me with getting supplies to get him through until he sees the social worker on July 21, which is this Friday.  He states that he feels that he was booted out of the hospital too quickly, 1 for not having the correct supplies.  And he had also asked for some help at home like home health etc due to being very weak.  He states he only activity he had had prior to discharge was standing and using the urinal and ambulating to the bathroom to take his bath.  Went over patient's medication.  He has received 1 month supply from the medication management clinic.  As of yet he has not been [place] for [employer].  Discussed if he does not get the Medicaid within the 30 days.  He will need to continue to get his medications from the medication management clinic but that he will need to show proof of income.  Patient states that he lives with his father and his son and all 3 live off the $31 a month that the father receives.  He states most months that they end up in the red.   He states that he is weighing himself daily his weights have been 137, 138, and 135.  He has been compliant with his medications.  He did have some problems with constipation and has been taking the stool softener he was prescribed.  Also discussed that he felt if this continued to be a problem  he could get over-the-counter fleets enemas.  He states that he remains weak.  Discussed doing activities and listening to his body is when he is tired or short of breath that he should sit down and rest and then when he returns to normal then to get up and resume activities.  He states that he has read all the heart failure teaching materials that he was given.  Has been sticking with the fluid restriction.  He is still looking at getting a PCP.  Also discussed that the cardiology office was attempting to get a hold with him.  Made him aware that I will let the office know where to contact him.  He had no further questions.  Instructed that he should call me on [phone] and if I did not answer it he could leave me a voicemail and that I would get back to him, he voices understanding.  Stressed the importance of following up in the outpatient heart failure clinic this Friday at 12:00 with his appointment to see the social worker Kennyth Lose, from [place].  He voices understanding and realizes the importance.  Pricilla Riffle RN [facility]

## 2022-03-09 ENCOUNTER — Encounter: Payer: Self-pay | Admitting: Licensed Clinical Social Worker

## 2022-03-09 NOTE — Progress Notes (Signed)
Heart and Vascular Care Navigation  03/09/2022  Lawrence Murray [birth year] [id number]  Reason for Referral: Patient seen in HF Clinic for resources.   Engaged with patient face to face for initial visit for Heart and Vascular Care Coordination.                                                                                                   Assessment:  Patient is a 56 yo male who resides with his father. Patient has no income and relies solely on his father's social security to mange the household bills. Patient states he "does not qualify for social security because I am short 3 credits".    Patient has no PCP, pending medicaid and some financial concerns.                               HRT/VAS Care Coordination     Patients Home Cardiology Office [place] [facility]   Outpatient Care Team Social Worker   Social Worker Name: Lawrence Murray, [place] [phone]   Living arrangements for the past 2 months Single Family Home   Lives with: Parents; Self  Patient is temporarily staying with his mother for care needs   Patient Current Insurance Coverage Medicaid Pending   Patient Has Concern With Paying Medical Bills Yes   Medical Bill Referrals: Pending Medicaid   Does Patient Have Prescription Coverage? No   Patient Prescription Assistance Programs Other   Home Assistive Devices/Equipment None       Social History:                                                                             SDOH Screenings   Alcohol Screen: Not on file  Depression (HQI6-9): Not on file  Financial Resource Strain: High Risk (03/09/2022)   Overall Financial Resource Strain (CARDIA)    Difficulty of Paying Living Expenses: Hard  Food Insecurity: Food Insecurity Present (03/09/2022)   Hunger Vital Sign    Worried About Running Out of Food in the Last Year: Sometimes true    Ran Out of Food in the Last Year: Sometimes true  Housing: Low Risk  (03/09/2022)   Housing    Last Housing Risk Score: 0  Physical  Activity: Not on file  Social Connections: Not on file  Stress: Not on file  Tobacco Use: Medium Risk (02/22/2022)   Patient History    Smoking Tobacco Use: Former    Smokeless Tobacco Use: Never    Passive Exposure: Not on file  Transportation Needs: No Transportation Needs (03/09/2022)   PRAPARE - Hydrologist (Medical): No    Lack of Transportation (Non-Medical): No    SDOH Interventions: Financial Resources:  Sales promotion account executive Interventions: Development worker, community (  Pending with [employer]) Social Security for Licensed conveyancer Insecurity:  Food Insecurity Interventions: Intervention Not Indicated  Housing Insecurity:  Housing Interventions: Intervention Not Indicated  Transportation:   Transportation Interventions: Intervention Not Indicated   Follow-up plan:  CSW [facility], gift card to obtain needed dressing supplies and encourage patient to apply for SSI. Patient and mother verbalize understanding of follow up and will contact CSW if needed. Lawrence Murray, [place], [place]

## 2022-03-13 ENCOUNTER — Other Ambulatory Visit: Payer: Self-pay

## 2022-03-14 ENCOUNTER — Ambulatory Visit: Payer: Self-pay | Admitting: Family

## 2022-03-16 ENCOUNTER — Other Ambulatory Visit
Admission: RE | Admit: 2022-03-16 | Discharge: 2022-03-16 | Disposition: A | Discharge status: Home / Self Care | Payer: Medicaid Other | Source: Ambulatory Visit | Attending: Family | Admitting: Family

## 2022-03-16 ENCOUNTER — Encounter: Payer: Self-pay | Admitting: Family

## 2022-03-16 ENCOUNTER — Ambulatory Visit: Payer: Medicaid Other | Attending: Family | Admitting: Family

## 2022-03-16 VITALS — BP 100/60 | HR 93 | Resp 20 | Ht 72.0 in | Wt 237.4 lb

## 2022-03-16 DIAGNOSIS — I5022 Chronic systolic (congestive) heart failure: Secondary | ICD-10-CM | POA: Diagnosis not present

## 2022-03-16 DIAGNOSIS — E119 Type 2 diabetes mellitus without complications: Secondary | ICD-10-CM

## 2022-03-16 DIAGNOSIS — I110 Hypertensive heart disease with heart failure: Secondary | ICD-10-CM | POA: Diagnosis not present

## 2022-03-16 DIAGNOSIS — F419 Anxiety disorder, unspecified: Secondary | ICD-10-CM | POA: Insufficient documentation

## 2022-03-16 DIAGNOSIS — I10 Essential (primary) hypertension: Secondary | ICD-10-CM | POA: Diagnosis not present

## 2022-03-16 DIAGNOSIS — Z87891 Personal history of nicotine dependence: Secondary | ICD-10-CM | POA: Diagnosis not present

## 2022-03-16 DIAGNOSIS — I509 Heart failure, unspecified: Secondary | ICD-10-CM | POA: Insufficient documentation

## 2022-03-16 DIAGNOSIS — I2510 Atherosclerotic heart disease of native coronary artery without angina pectoris: Secondary | ICD-10-CM | POA: Diagnosis not present

## 2022-03-16 DIAGNOSIS — I25118 Atherosclerotic heart disease of native coronary artery with other forms of angina pectoris: Secondary | ICD-10-CM

## 2022-03-16 LAB — BASIC METABOLIC PANEL
Anion gap: 9 (ref 5–15)
BUN: 20 mg/dL (ref 6–20)
CO2: 26 mmol/L (ref 22–32)
Calcium: 9 mg/dL (ref 8.9–10.3)
Chloride: 103 mmol/L (ref 98–111)
Creatinine, Ser: 1.11 mg/dL (ref 0.61–1.24)
GFR, Estimated: >60 mL/min (ref >60)
Glucose, Bld: 108 mg/dL — ABNORMAL HIGH (ref 70–99)
Potassium: 4.2 mmol/L (ref 3.5–5.1)
Sodium: 138 mmol/L (ref 135–145)

## 2022-03-16 NOTE — Patient Instructions (Addendum)
Continue weighing daily and call for an overnight weight gain of 3 pounds or more or a weekly weight gain of more than 5 pounds.   If you have voicemail, please make sure your mailbox is cleaned out so that we may leave a message and please make sure to listen to any voicemails.    Start wearing compression socks daily with removal at bedtime

## 2022-03-16 NOTE — Progress Notes (Signed)
Patient ID: Lawrence Murray, male    DOB: 1966/01/09, 56 y.o.   MRN: [id number]  HPI  Lawrence Murray is a 56 y/o male with a history of CAD, DM, HTN, previous tobacco use and chronic heart failure.   Echo report from 02/06/22 reviewed and showed an EF of 30-35% along with mild Lawrence.   LHC/RHC done 02/21/22 and showed: Severe three-vessel coronary artery disease, including chronic total/subtotal occlusions of large D1 and OM1 branches, mid LAD, and ostial rPDA.  There is also moderate-severe disease involving the proximal and distal LCx as well as the distal RCA. Moderately elevated left heart, right heart, and pulmonary artery pressures (LVEDP 32 mmHg, PCWP 30 mmHg, mean RA 9 mmHg, RVEDP 14 mmHg, and mean PAP 37 mmHg). Low normal Fick cardiac output/index (CO 5.7 L/min, CI 2.5 L/min/m^2).  Admitted 02/05/22 due to acute on chronic heart failure. Initially given IV lasix and then midodrine due to soft BP. Elevated troponin thought to be due to demand ischemia. Cardiology and vascular consults obtained. Angiogram was done due to concern of PAD and no intervention was needed. Good arterial flow noted. LHC/RHC completed with above results. Cardiac MRI done and showed severely reduced Bi-ventricular function.  LVEF 16%, RVEF 19%. Findings consistent with ischemic cardiomyopathy with viable LV lateral and inferior walls. Non-viable LV anterior and apical wall. Discharged after 24 days.   He presents today for his initial visit with a chief complaint of moderate fatigue with little exertion. Describes this as chronic in nature. Has associated cough, shortness of breath, pedal edema (improving), palpitations, light-headedness, anxiety and leg pain along with this. He denies any difficulty sleeping, chest pain, abdominal distention or weight gain.   Not adding salt and has been reading food labels for sodium content. Admits that it's overwhelming because he doesn't know what to eat and his finances are limited so he has  to pick more inexpensive foods.   Past Medical History:  Diagnosis Date   CHF (congestive heart failure) (HCC)    Coronary artery disease    Diabetes mellitus without complication ([place])    Hypertension    Past Surgical History:  Procedure Laterality Date   LOWER EXTREMITY ANGIOGRAPHY Left 02/14/2022   Procedure: Lower Extremity Angiography;  Surgeon: Algernon Huxley, MD;  Location: [place] [facility];  Service: Cardiovascular;  Laterality: Left;   RIGHT/LEFT HEART CATH AND CORONARY ANGIOGRAPHY N/A 02/21/2022   Procedure: RIGHT/LEFT HEART CATH AND CORONARY ANGIOGRAPHY;  Surgeon: Nelva Bush, MD;  Location: [place] [facility];  Service: Cardiovascular;  Laterality: N/A;   Family History  Problem Relation Age of Onset   Congestive Heart Failure Father    Social History   Tobacco Use   Smoking status: Former    Types: Cigarettes   Smokeless tobacco: Never  Substance Use Topics   Alcohol use: Never   No Known Allergies Prior to Admission medications   Medication Sig Start Date End Date Taking? Authorizing Provider  acetaminophen (TYLENOL) 500 MG tablet Take 2 tablets (1,000 mg total) by mouth every 6 (six) hours as needed for mild pain, moderate pain, fever or headache. 03/01/22  Yes Emeterio Reeve, DO  ALPRAZolam Duanne Moron) 1 MG tablet Take 0.5 mg by mouth 3 (three) times daily as needed for anxiety.   Yes [provider]  aspirin EC 81 MG tablet Take 1 tablet (81 mg total) by mouth daily. Swallow whole. 03/02/22  Yes Emeterio Reeve, DO  atorvastatin (LIPITOR) 80 MG tablet Take 1 tablet (80  mg total) by mouth daily. 03/02/22  Yes Emeterio Reeve, DO  bisacodyl (DULCOLAX) 5 MG EC tablet Take 2 tablets (10 mg total) by mouth daily as needed for moderate constipation. 03/01/22  Yes Emeterio Reeve, DO  dapagliflozin propanediol (FARXIGA) 10 MG TABS tablet Take 1 tablet (10 mg total) by mouth daily. 03/01/22  Yes Emeterio Reeve, DO  digoxin (LANOXIN) 0.125 MG  tablet Take 1 tablet (0.125 mg total) by mouth daily. 03/02/22  Yes Emeterio Reeve, DO  furosemide (LASIX) 40 MG tablet Take 1 tablet (40 mg total) by mouth daily. 03/02/22  Yes Emeterio Reeve, DO  metFORMIN (GLUCOPHAGE) 500 MG tablet Take 1 tablet (500 mg total) by mouth daily with breakfast for 7 days, THEN 1 tablet (500 mg total) 2 (two) times daily with a meal for 23 days. 03/01/22 03/31/22 Yes Emeterio Reeve, DO  methocarbamol (ROBAXIN) 750 MG tablet Take 1 tablet (750 mg total) by mouth every 6 (six) hours as needed for muscle spasms. 03/01/22  Yes Emeterio Reeve, DO  metoprolol succinate (TOPROL-XL) 25 MG 24 hr tablet Take 0.5 tablets (12.5 mg total) by mouth daily. 03/02/22  Yes Emeterio Reeve, DO  pantoprazole (PROTONIX) 40 MG tablet Take 1 tablet (40 mg total) by mouth daily. 03/02/22  Yes Emeterio Reeve, DO  potassium chloride SA (KLOR-CON M) 20 MEQ tablet Take 1 tablet (20 mEq total) by mouth daily. 03/02/22  Yes Emeterio Reeve, DO  pregabalin (LYRICA) 100 MG capsule Take 1 capsule (100 mg total) by mouth 3 (three) times daily. 03/01/22  Yes Emeterio Reeve, DO  QUEtiapine (SEROQUEL) 25 MG tablet Take 1 tablet (25 mg total) by mouth at bedtime. 03/01/22  Yes Emeterio Reeve, DO  spironolactone (ALDACTONE) 25 MG tablet Take 0.5 tablets (12.5 mg total) by mouth daily. 03/02/22  Yes Emeterio Reeve, DO    Review of Systems  Constitutional:  Positive for fatigue (easily). Negative for appetite change.  HENT:  Negative for congestion, postnasal drip and sore throat.   Eyes: Negative.   Respiratory:  Positive for cough and shortness of breath. Negative for chest tightness and wheezing.   Cardiovascular:  Positive for palpitations (at times) and leg swelling (improving). Negative for chest pain.  Gastrointestinal:  Negative for abdominal distention and abdominal pain.  Endocrine: Negative.   Genitourinary: Negative.   Musculoskeletal:  Positive for arthralgias (leg  pain). Negative for back pain.  Skin: Negative.   Allergic/Immunologic: Negative.   Neurological:  Positive for light-headedness (intermittent). Negative for dizziness.  Hematological:  Negative for adenopathy. Does not bruise/bleed easily.  Psychiatric/Behavioral:  Negative for dysphoric mood and sleep disturbance (sleeping on 2 pillows). The patient is nervous/anxious.    Wt Readings from Last 3 Encounters:  03/16/22 237 lb 6 oz (107.7 kg)  02/28/22 231 lb 9.6 oz (105.1 kg)   Lab Results  Component Value Date   CREATININE 1.11 03/16/2022   CREATININE 1.14 03/01/2022   CREATININE 1.11 02/28/2022   Physical Exam Vitals and nursing note reviewed. Exam conducted with a chaperone present (mom & son).  Constitutional:      Appearance: Normal appearance.  HENT:     Head: Normocephalic and atraumatic.  Cardiovascular:     Rate and Rhythm: Normal rate and regular rhythm.  Pulmonary:     Effort: Pulmonary effort is normal. No respiratory distress.     Breath sounds: No wheezing or rales.  Abdominal:     General: There is no distension.     Palpations: Abdomen is soft.  Musculoskeletal:  General: No tenderness.     Cervical back: Normal range of motion and neck supple.     Right lower leg: Edema (2+ pitting) present.     Left lower leg: Edema (2+ pitting) present.  Skin:    General: Skin is warm and dry.  Neurological:     General: No focal deficit present.     Mental Status: He is alert and oriented to person, place, and time.  Psychiatric:        Mood and Affect: Mood is anxious.        Behavior: Behavior normal.        Thought Content: Thought content normal.   Assessment & Plan:  1: Chronic heart failure with reduced ejection fraction- - NYHA class III - euvolemic today - weighing daily and says that his weight doesn't fluctuate more than 2-3 pounds; reminded to call for an overnight weight gain of > 2 pounds or a weekly weight gain of > 5 pounds - not adding salt  but admits that finding low sodium foods that he can afford is difficult - on GDMT of farxiga, metoprolol & spironolactone - BP may not allow for entresto - instructed to get compression socks and put them on every morning with removal at bedtime - edema does improve some overnight  - BNP 02/05/22 was 919.6  2: HTN- - BP initially elevated (122/108) but improved greatly after recheck with manual cuff (100/60) - to see PCP Hendricks Milo) at [facility] on 04/20/22 although he says that it is too far from home and he's looking to see someone in [place] - hoping to get approved for medicaid soon - BMP 03/01/22 reviewed and showed sodium 133, potassium 4.5, creatinine 1.14 and GFR >60 - will recheck BMP today  3: DM- - A1c 02/05/22 was 7.7%  4: CAD-  - to see cardiology Kathlen Mody) 04/11/22 - has not smoked in > 1 month   Medication bottles reviewed.   Return in 1 month, sooner if needed.

## 2022-03-19 ENCOUNTER — Other Ambulatory Visit: Payer: Self-pay

## 2022-03-21 ENCOUNTER — Ambulatory Visit: Payer: Self-pay | Admitting: Family

## 2022-03-26 ENCOUNTER — Other Ambulatory Visit: Payer: Self-pay

## 2022-03-26 MED ORDER — DAPAGLIFLOZIN PROPANEDIOL 10 MG PO TABS: ORAL_TABLET | ORAL | 3 refills | Status: DC

## 2022-03-27 ENCOUNTER — Other Ambulatory Visit: Payer: Self-pay

## 2022-03-28 ENCOUNTER — Other Ambulatory Visit: Payer: Self-pay

## 2022-03-28 ENCOUNTER — Other Ambulatory Visit: Payer: Self-pay | Admitting: *Deleted

## 2022-03-28 ENCOUNTER — Telehealth: Payer: Self-pay | Admitting: Cardiology

## 2022-03-28 NOTE — Telephone Encounter (Signed)
*  STAT* If patient is at the pharmacy, call can be transferred to refill team.   1. Which medications need to be refilled? (please list name of each medication and dose if known)  atorvastatin (LIPITOR) 80 MG tablet  digoxin (LANOXIN) 0.125 MG tablet  furosemide (LASIX) 40 MG tablet  metFORMIN (GLUCOPHAGE) 500 MG tablet  metoprolol succinate (TOPROL-XL) 25 MG 24 hr tablet  pantoprazole (PROTONIX) 40 MG tablet  potassium chloride SA (KLOR-CON M) 20 MEQ tablet  pregabalin (LYRICA) 100 MG capsule  QUEtiapine (SEROQUEL) 25 MG tablet  spironolactone (ALDACTONE) 25 MG tablet  2. Which pharmacy/location (including street and city if local pharmacy) is medication to be sent to? [facility]  3. Do they need a 30 day or 90 day supply? 30   Patient is out of medication [facility] told patient to call our office

## 2022-03-28 NOTE — Telephone Encounter (Signed)
Pt requesting refills for 30 days. Pt has not pcp requesting non cardiac medications refills as well. F/u Cadence Kathlen Mody 8/23

## 2022-03-29 ENCOUNTER — Other Ambulatory Visit: Payer: Self-pay

## 2022-03-30 ENCOUNTER — Other Ambulatory Visit: Payer: Self-pay

## 2022-03-30 ENCOUNTER — Telehealth: Payer: Self-pay | Admitting: Family

## 2022-03-30 MED ORDER — SPIRONOLACTONE 25 MG PO TABS
12.5000 mg | ORAL_TABLET | Freq: Every day | ORAL | 0 refills | Status: DC
  Filled 2022-03-30: qty 15, 30d supply, fill #0

## 2022-03-30 MED ORDER — PANTOPRAZOLE SODIUM 40 MG PO TBEC
40.0000 mg | DELAYED_RELEASE_TABLET | Freq: Every day | ORAL | 0 refills | Status: DC
  Filled 2022-03-30: qty 30, 30d supply, fill #0

## 2022-03-30 MED ORDER — ATORVASTATIN CALCIUM 80 MG PO TABS
80.0000 mg | ORAL_TABLET | Freq: Every day | ORAL | 0 refills | Status: DC
  Filled 2022-03-30: qty 30, 30d supply, fill #0

## 2022-03-30 MED ORDER — PREGABALIN 100 MG PO CAPS: 100.0000 mg | ORAL_CAPSULE | Freq: Three times a day (TID) | ORAL | 0 refills | Status: DC

## 2022-03-30 MED ORDER — PREGABALIN 100 MG PO CAPS
ORAL_CAPSULE | ORAL | 0 refills | Status: DC
  Filled 2022-03-30: qty 90, 30d supply, fill #0

## 2022-03-30 MED ORDER — POTASSIUM CHLORIDE CRYS ER 20 MEQ PO TBCR
20.0000 meq | EXTENDED_RELEASE_TABLET | Freq: Every day | ORAL | 0 refills | Status: DC
  Filled 2022-03-30: qty 30, 30d supply, fill #0

## 2022-03-30 MED ORDER — METOPROLOL SUCCINATE ER 25 MG PO TB24
12.5000 mg | ORAL_TABLET | Freq: Every day | ORAL | 0 refills | Status: DC
  Filled 2022-03-30 – 2022-04-18 (×3): qty 30, 60d supply, fill #0

## 2022-03-30 MED ORDER — METFORMIN HCL 500 MG PO TABS
500.0000 mg | ORAL_TABLET | Freq: Two times a day (BID) | ORAL | 0 refills | Status: DC
  Filled 2022-03-30: qty 60, 30d supply, fill #0

## 2022-03-30 MED ORDER — FUROSEMIDE 40 MG PO TABS
40.0000 mg | ORAL_TABLET | Freq: Every day | ORAL | 0 refills | Status: DC
  Filled 2022-03-30: qty 30, 30d supply, fill #0

## 2022-03-30 MED ORDER — QUETIAPINE FUMARATE 25 MG PO TABS
25.0000 mg | ORAL_TABLET | Freq: Every day | ORAL | 0 refills | Status: DC
  Filled 2022-03-30: qty 30, 30d supply, fill #0

## 2022-03-30 MED ORDER — METHOCARBAMOL 750 MG PO TABS
750.0000 mg | ORAL_TABLET | Freq: Four times a day (QID) | ORAL | 0 refills | Status: DC | PRN
  Filled 2022-03-30: qty 30, 8d supply, fill #0

## 2022-03-30 MED ORDER — DIGOXIN 125 MCG PO TABS
0.1250 mg | ORAL_TABLET | Freq: Every day | ORAL | 0 refills | Status: DC
  Filled 2022-03-30: qty 30, 30d supply, fill #0

## 2022-03-30 NOTE — Telephone Encounter (Signed)
Spoke to patient who called upset stating he is completely out of his medications and that he needs them refilled. After speaking with this patient a couple days ago I advised patient that he needs to reach out to [facility] as our provider is out until Monday 8/14 and they should be able to help him with his medications. Patient called back today upset stating he still has not received his medications and asked me to call heart care to get them to take care of it. I called [facility] who states they sent all his medications to medication management( even thought hey have not seen this patient before and only suppose to take care of heart medications) and they can see that patients heart medications are ready but unsure of the status of the others and will look into it and their team will call the patient back.   Lawrence Murray, NT

## 2022-03-30 NOTE — Telephone Encounter (Signed)
Patient seen in hospital and scheduled to F/U with Cadence on 04/11/22. Spoke with patient and advised him that he would need to establish care with a PCP to get refills on non-cardiac medications. He states he was not aware he needed to get one because he was told during his hospital discharge that our office would refill all of his medications. Patient expressed gratitude for refilling his cardiac meds but states he is not sure how he will get his muscle relaxers refilled before they run out.

## 2022-03-30 NOTE — Telephone Encounter (Signed)
Patient called back stating [facility] will take care of all his medications right now and sent them over to medication management except farxiga so patient is coming in to pick up 3 boxes of samples to tie him over until he gets his perscription.   Valeria Boza, NT

## 2022-03-30 NOTE — Telephone Encounter (Signed)
All cardiac medications have been filled.

## 2022-03-30 NOTE — Telephone Encounter (Signed)
   Pt is following up refills, he is asking why these medications was not sent out   metFORMIN (GLUCOPHAGE) 500 MG tablet pantoprazole (PROTONIX) 40 MG tablet pregabalin (LYRICA) 100 MG capsule QUEtiapine (SEROQUEL) 25 MG tablet  He asked to get a call back today

## 2022-03-30 NOTE — Telephone Encounter (Signed)
Caller stated patient reached out to her to follow-up on getting all of his medications refilled.  Caller requested a cal back to the patient to give him an update on the status of the refills.

## 2022-03-30 NOTE — Telephone Encounter (Signed)
Would you like to refill non cardiac refills as well? Pt doesn't have a PCP.

## 2022-04-11 ENCOUNTER — Other Ambulatory Visit
Admission: RE | Admit: 2022-04-11 | Discharge: 2022-04-11 | Disposition: A | Discharge status: Home / Self Care | Payer: Medicaid Other | Source: Ambulatory Visit | Attending: Medical | Admitting: Medical

## 2022-04-11 ENCOUNTER — Encounter: Payer: Self-pay | Admitting: Medical

## 2022-04-11 ENCOUNTER — Other Ambulatory Visit: Payer: Self-pay

## 2022-04-11 ENCOUNTER — Ambulatory Visit (INDEPENDENT_AMBULATORY_CARE_PROVIDER_SITE_OTHER): Payer: Medicaid Other | Admitting: Medical

## 2022-04-11 VITALS — BP 100/60 | HR 86 | Ht 72.0 in | Wt 242.0 lb

## 2022-04-11 DIAGNOSIS — E119 Type 2 diabetes mellitus without complications: Secondary | ICD-10-CM

## 2022-04-11 DIAGNOSIS — E782 Mixed hyperlipidemia: Secondary | ICD-10-CM | POA: Diagnosis not present

## 2022-04-11 DIAGNOSIS — I2510 Atherosclerotic heart disease of native coronary artery without angina pectoris: Secondary | ICD-10-CM

## 2022-04-11 DIAGNOSIS — I5022 Chronic systolic (congestive) heart failure: Secondary | ICD-10-CM

## 2022-04-11 DIAGNOSIS — I5043 Acute on chronic combined systolic (congestive) and diastolic (congestive) heart failure: Secondary | ICD-10-CM | POA: Diagnosis not present

## 2022-04-11 LAB — BASIC METABOLIC PANEL
Anion gap: 9 (ref 5–15)
BUN: 20 mg/dL (ref 6–20)
CO2: 27 mmol/L (ref 22–32)
Calcium: 9.5 mg/dL (ref 8.9–10.3)
Chloride: 104 mmol/L (ref 98–111)
Creatinine, Ser: 1.11 mg/dL (ref 0.61–1.24)
GFR, Estimated: >60 mL/min (ref >60)
Glucose, Bld: 139 mg/dL — ABNORMAL HIGH (ref 70–99)
Potassium: 4.5 mmol/L (ref 3.5–5.1)
Sodium: 140 mmol/L (ref 135–145)

## 2022-04-11 LAB — BRAIN NATRIURETIC PEPTIDE: B Natriuretic Peptide: 347 pg/mL — ABNORMAL HIGH (ref 0.0–100.0)

## 2022-04-11 MED ORDER — FUROSEMIDE 40 MG PO TABS
40.0000 mg | ORAL_TABLET | Freq: Every day | ORAL | 0 refills | Status: DC
  Filled 2022-04-11 – 2022-04-18 (×3): qty 30, 30d supply, fill #0

## 2022-04-11 NOTE — Patient Instructions (Signed)
Medication Instructions:  Your physician has recommended you make the following change in your medication:   INCREASE Lasix to 40 mg twice a day for 3 days, then REDUCE Lasix to 40 mg daily  A refill has been sent to your local pharmacy.  *If you need a refill on your cardiac medications before your next appointment, please call your pharmacy*   Lab Work: Bmp and Bnp today  Please have your lab drawn at the [facility]. Stop at the Registration desk to check in.  If you have labs (blood work) drawn today and your tests are completely normal, you will receive your results only by: [facility] (if you have MyChart) OR A paper copy in the mail If you have any lab test that is abnormal or we need to change your treatment, we will call you to review the results.   Testing/Procedures: None ordered   Follow-Up: At [facility], you and your health needs are our priority.  As part of our continuing mission to provide you with exceptional heart care, we have created designated Provider Care Teams.  These Care Teams include your primary Cardiologist (physician) and Advanced Practice Providers (APPs -  Physician Assistants and Nurse Practitioners) who all work together to provide you with the care you need, when you need it.  We recommend signing up for the patient portal called "MyChart".  Sign up information is provided on this After Visit Summary.  MyChart is used to connect with patients for Virtual Visits (Telemedicine).  Patients are able to view lab/test results, encounter notes, upcoming appointments, etc.  Non-urgent messages can be sent to your provider as well.   To learn more about what you can do with MyChart, go to [email or web address].    Your next appointment:   6 week(s)  The format for your next appointment:   In Person  Provider:   You may see Kate Sable, MD or one of the following Advanced Practice Providers on your designated Care Team:    Murray Hodgkins, NP Christell Faith, PA-C Cadence Kathlen Mody, PA-C   Other Instructions You have been referred to [facility]. They will be in contact with you to schedule.   Important Information About Sugar

## 2022-04-11 NOTE — Progress Notes (Addendum)
Cardiology Office Note:    Date:  05/07/2022   ID:  Lawrence Murray, DOB 04-13-1966, MRN [id number]  PCP:  Lawrence Lo, NP  [facility] Cardiologist:  Kate Sable, MD  [place] Electrophysiologist:  None   Referring MD: No ref. provider found   Chief Complaint: Hospital follow-up  History of Present Illness:    Lawrence Murray is a 56 y.o. male with a hx of obesity, smoker who is being seen 02/07/2022 for the evaluation of shortness of breath.  The patient was admitted 01/2022 for new onset heart failure. Echo showed LVEF 30-35%. Patient underwent angiogram with vascular surgery on 6/28.  Good arterial flow noted.  No intervention was indicated. He was treated with IV lasix. L/R heart cath showed 3V CAD. Left and right pressures were high. Cardiac MRI showed severely reduced Bi-V function. LVEF 16%, ICM with viable LV lateral and inferior walls. Non-viable LV anterior and apical wall. Not felt to benefit from CABG. IV lasix>lasix '40mg'$  daily.   Today, the patient reports he has been OK since being home. He still hasn't smoked. He has been having issues with diet, but trying to eat better. He is taking lasix '40mg'$  daily. Swelling noted on exam today, he reports swelling gets worse at the end of the day. No chest pain or SOB. He has some feet tingling, which may be from diabetes.BP is soft today. No chest pain, orthopnea, or pnd.    Past Medical History:  Diagnosis Date   CHF (congestive heart failure) (HCC)    Coronary artery disease    Diabetes mellitus without complication ([place])    Hypertension     Past Surgical History:  Procedure Laterality Date   LOWER EXTREMITY ANGIOGRAPHY Left 02/14/2022   Procedure: Lower Extremity Angiography;  Surgeon: Algernon Huxley, MD;  Location: [place] [facility];  Service: Cardiovascular;  Laterality: Left;   RIGHT/LEFT HEART CATH AND CORONARY ANGIOGRAPHY N/A 02/21/2022   Procedure: RIGHT/LEFT HEART CATH AND CORONARY ANGIOGRAPHY;  Surgeon:  Nelva Bush, MD;  Location: [place] [facility];  Service: Cardiovascular;  Laterality: N/A;    Current Medications: Current Meds  Medication Sig   acetaminophen (TYLENOL) 500 MG tablet Take 2 tablets (1,000 mg total) by mouth every 6 (six) hours as needed for mild pain, moderate pain, fever or headache.   aspirin EC 81 MG tablet Take 1 tablet (81 mg total) by mouth daily. Swallow whole.   bisacodyl (DULCOLAX) 5 MG EC tablet Take 2 tablets (10 mg total) by mouth daily as needed for moderate constipation.   metoprolol succinate (TOPROL-XL) 25 MG 24 hr tablet Take 0.5 tablets (12.5 mg total) by mouth daily.   pregabalin (LYRICA) 100 MG capsule Take 1 capsule by mouth three times a day   [DISCONTINUED] ALPRAZolam (XANAX) 1 MG tablet Take 0.5 mg by mouth 3 (three) times daily as needed for anxiety.   [DISCONTINUED] atorvastatin (LIPITOR) 80 MG tablet Take 1 tablet (80 mg total) by mouth daily.   [DISCONTINUED] dapagliflozin propanediol (FARXIGA) 10 MG TABS tablet Take one tablet by mouth daily   [DISCONTINUED] digoxin (LANOXIN) 0.125 MG tablet Take 1 tablet (0.125 mg total) by mouth daily.   [DISCONTINUED] furosemide (LASIX) 40 MG tablet Take 1 tablet (40 mg total) by mouth daily.   [DISCONTINUED] metFORMIN (GLUCOPHAGE) 500 MG tablet Take 1 tablet (500 mg total) by mouth 2 (two) times daily.   [DISCONTINUED] methocarbamol (ROBAXIN) 750 MG tablet Take 1 tablet (750 mg total) by mouth every 6 (six) hours  as needed for muscle spasms.   [DISCONTINUED] pantoprazole (PROTONIX) 40 MG tablet Take 1 tablet (40 mg total) by mouth daily.   [DISCONTINUED] potassium chloride SA (KLOR-CON M) 20 MEQ tablet Take 1 tablet (20 mEq total) by mouth daily.   [DISCONTINUED] pregabalin (LYRICA) 100 MG capsule Take 1 capsule (100 mg total) by mouth 3 (three) times daily.   [DISCONTINUED] QUEtiapine (SEROQUEL) 25 MG tablet Take 1 tablet (25 mg total) by mouth at bedtime.   [DISCONTINUED] spironolactone (ALDACTONE) 25  MG tablet Take 0.5 tablets (12.5 mg total) by mouth daily.     Allergies:   Patient has no known allergies.   Social History   Socioeconomic History   Marital status: Single    Spouse name: Not on file   Number of children: Not on file   Years of education: Not on file   Highest education level: Not on file  Occupational History   Not on file  Tobacco Use   Smoking status: Former    Types: Cigarettes   Smokeless tobacco: Never  Vaping Use   Vaping Use: Not on file  Substance and Sexual Activity   Alcohol use: Never   Drug use: Never   Sexual activity: Not Currently  Other Topics Concern   Not on file  Social History Narrative   Not on file   Social Determinants of Health   Financial Resource Strain: High Risk (03/09/2022)   Overall Financial Resource Strain (CARDIA)    Difficulty of Paying Living Expenses: Hard  Food Insecurity: Food Insecurity Present (03/09/2022)   Hunger Vital Sign    Worried About Running Out of Food in the Last Year: Sometimes true    Ran Out of Food in the Last Year: Sometimes true  Transportation Needs: No Transportation Needs (03/09/2022)   PRAPARE - Hydrologist (Medical): No    Lack of Transportation (Non-Medical): No  Physical Activity: Not on file  Stress: Not on file  Social Connections: Not on file     Family History: The patient's family history includes Congestive Heart Failure in his father.  ROS:   Please see the history of present illness.     All other systems reviewed and are negative.  EKGs/Labs/Other Studies Reviewed:    The following studies were reviewed today:  TTE (02/06/2022): 1. Left ventricular ejection fraction, by estimation, is 30 to 35%. The  left ventricle has moderately decreased function. The left ventricle  demonstrates global hypokinesis. Left ventricular diastolic function could  not be evaluated.   2. RV not well visualized but likely enlarged.   3. Large pleural  effusion.   4. The mitral valve is normal in structure. Mild mitral valve  regurgitation.   5. The aortic valve is normal in structure. Aortic valve regurgitation is  not visualized.   EKG:  EKG is ordered today.  The ekg ordered today demonstrates NSR 86bpm, nonspecific T wave changes  Recent Labs: 02/05/2022: TSH 9.011 02/11/2022: ALT 27 02/14/2022: Magnesium 2.4 02/23/2022: Hemoglobin 13.3; Platelets 368 04/11/2022: B Natriuretic Peptide 347.0; BUN 20; Creatinine, Ser 1.11; Potassium 4.5; Sodium 140  Recent Lipid Panel    Component Value Date/Time   CHOL 242 (H) 02/24/2022 0614   TRIG 235 (H) 02/24/2022 0614   HDL 35 (L) 02/24/2022 0614   CHOLHDL 6.9 02/24/2022 0614   VLDL 47 (H) 02/24/2022 0614   LDLCALC 160 (H) 02/24/2022 7622     Physical Exam:    VS:  BP 100/60 (BP  Location: Left Arm, Patient Position: Sitting, Cuff Size: Normal)   Pulse 86   Ht 6' (1.829 m)   Wt 242 lb (109.8 kg)   SpO2 98%   BMI 32.82 kg/m     Wt Readings from Last 3 Encounters:  05/03/22 247 lb 4.8 oz (112.2 kg)  04/13/22 242 lb 6 oz (109.9 kg)  04/11/22 242 lb (109.8 kg)     GEN:  Well nourished, well developed in no acute distress HEENT: Normal NECK: No JVD; No carotid bruits LYMPHATICS: No lymphadenopathy CARDIAC: RRR, no murmurs, rubs, gallops RESPIRATORY:  Clear to auscultation without rales, wheezing or rhonchi  ABDOMEN: Soft, non-tender, non-distended MUSCULOSKELETAL:  1+ lower leg edema; No deformity  SKIN: Warm and dry NEUROLOGIC:  Alert and oriented x 3 PSYCHIATRIC:  Normal affect   ASSESSMENT:    1. Acute on chronic combined systolic and diastolic CHF (congestive heart failure) ([place])   2. Chronic systolic heart failure ([place])   3. Type 2 diabetes mellitus without complication, without long-term current use of insulin ([place])   4. Hyperlipidemia, mixed   5. Coronary artery disease involving native coronary artery of native heart without angina pectoris    PLAN:    In order of  problems listed above:  HFrEF Chronic systolic heart failure Echo showed LVEF 30-35%. LHC showed severe CAD (report above). cMRI was ordered which showed viable Lcx and RCA and non-viable LAD, however no good intervention options for Lcx and RCA. Plan for medical management. He is on Toprol 12.'5mg'$  daily, Arlyce Harman 12.'5mg'$  daily and [place] '10mg'$  daily. Low BP limiting GDMT. He has 1+ lower leg edema. We will increase lasix to '40mg'$  BID x 3 days, then back down to '40mg'$  daily. I will check BMET and BNP. We will send in an extra lasix dose to have in case he runs out. Plan to repeat an echo in 2 months. We will refer to cardiac rehab.   Severe 3V CAD LHC reports above. No plan for intervention or CTTS consult as above. No anginal symptoms reported. Plan to continue medical management. Continue ASA, Toprol and Lipitor. Lifestyle changes encouraged.  HLD LDL 160, HDL 32, Chol 242, TG 235. We will re-check fasting lipid panel at follow-up. Continue Lipitor.   DM2 A1C 7.7. He will continue to follow with PCP.   Disposition: Follow up in 6 week(s) with MD/APP     Signed, Jerrit Horen Ninfa Meeker, PA-C  05/07/2022 8:38 AM    [facility]

## 2022-04-13 ENCOUNTER — Other Ambulatory Visit: Payer: Self-pay

## 2022-04-13 ENCOUNTER — Ambulatory Visit: Payer: Medicaid Other | Attending: Family | Admitting: Family

## 2022-04-13 ENCOUNTER — Encounter: Payer: Self-pay | Admitting: Family

## 2022-04-13 ENCOUNTER — Telehealth: Payer: Self-pay | Admitting: Family

## 2022-04-13 VITALS — BP 115/69 | HR 91 | Resp 20 | Ht 72.0 in | Wt 242.4 lb

## 2022-04-13 DIAGNOSIS — Z79899 Other long term (current) drug therapy: Secondary | ICD-10-CM | POA: Diagnosis not present

## 2022-04-13 DIAGNOSIS — R079 Chest pain, unspecified: Secondary | ICD-10-CM | POA: Insufficient documentation

## 2022-04-13 DIAGNOSIS — R059 Cough, unspecified: Secondary | ICD-10-CM | POA: Diagnosis not present

## 2022-04-13 DIAGNOSIS — Z7901 Long term (current) use of anticoagulants: Secondary | ICD-10-CM | POA: Insufficient documentation

## 2022-04-13 DIAGNOSIS — I10 Essential (primary) hypertension: Secondary | ICD-10-CM

## 2022-04-13 DIAGNOSIS — I25118 Atherosclerotic heart disease of native coronary artery with other forms of angina pectoris: Secondary | ICD-10-CM

## 2022-04-13 DIAGNOSIS — R002 Palpitations: Secondary | ICD-10-CM | POA: Diagnosis not present

## 2022-04-13 DIAGNOSIS — E119 Type 2 diabetes mellitus without complications: Secondary | ICD-10-CM | POA: Diagnosis not present

## 2022-04-13 DIAGNOSIS — F419 Anxiety disorder, unspecified: Secondary | ICD-10-CM | POA: Insufficient documentation

## 2022-04-13 DIAGNOSIS — Z87891 Personal history of nicotine dependence: Secondary | ICD-10-CM | POA: Insufficient documentation

## 2022-04-13 DIAGNOSIS — I2510 Atherosclerotic heart disease of native coronary artery without angina pectoris: Secondary | ICD-10-CM | POA: Insufficient documentation

## 2022-04-13 DIAGNOSIS — M7989 Other specified soft tissue disorders: Secondary | ICD-10-CM | POA: Insufficient documentation

## 2022-04-13 DIAGNOSIS — R0602 Shortness of breath: Secondary | ICD-10-CM | POA: Diagnosis present

## 2022-04-13 DIAGNOSIS — I5022 Chronic systolic (congestive) heart failure: Secondary | ICD-10-CM | POA: Diagnosis not present

## 2022-04-13 DIAGNOSIS — M79606 Pain in leg, unspecified: Secondary | ICD-10-CM | POA: Insufficient documentation

## 2022-04-13 DIAGNOSIS — I110 Hypertensive heart disease with heart failure: Secondary | ICD-10-CM | POA: Insufficient documentation

## 2022-04-13 DIAGNOSIS — Z7984 Long term (current) use of oral hypoglycemic drugs: Secondary | ICD-10-CM | POA: Diagnosis not present

## 2022-04-13 DIAGNOSIS — R140 Abdominal distension (gaseous): Secondary | ICD-10-CM | POA: Insufficient documentation

## 2022-04-13 MED ORDER — DAPAGLIFLOZIN PROPANEDIOL 10 MG PO TABS
ORAL_TABLET | ORAL | 3 refills | Status: DC
Start: 2022-04-13 — End: 2022-05-03

## 2022-04-13 MED ORDER — ACCU-CHEK GUIDE ME W/DEVICE KIT
PACK | 0 refills | Status: DC
  Filled 2022-04-18 – 2022-05-04 (×2): qty 1, 30d supply, fill #0

## 2022-04-13 MED ORDER — GLUCOSE BLOOD VI STRP
ORAL_STRIP | 0 refills | Status: DC
  Filled 2022-04-17 – 2022-04-20 (×6): qty 100, fill #0
  Filled 2022-04-25: qty 100, 25d supply, fill #0
  Filled 2022-05-04: qty 100, 90d supply, fill #0

## 2022-04-13 MED ORDER — ACCU-CHEK SOFTCLIX LANCETS MISC
0 refills | Status: DC
  Filled 2022-04-17 – 2022-04-20 (×6): qty 100, fill #0
  Filled 2022-04-25: qty 100, 25d supply, fill #0
  Filled 2022-05-04 – 2022-05-10 (×2): qty 100, 90d supply, fill #0

## 2022-04-13 NOTE — Progress Notes (Signed)
Patient ID: Lawrence Murray, male    DOB: 07-Jan-1966, 56 y.o.   MRN: [id number]  HPI  Mr Eyer is a 56 y/o male with a history of CAD, DM, HTN, previous tobacco use and chronic heart failure.   Echo report from 02/06/22 reviewed and showed an EF of 30-35% along with mild MR.   LHC/RHC done 02/21/22 and showed: Severe three-vessel coronary artery disease, including chronic total/subtotal occlusions of large D1 and OM1 branches, mid LAD, and ostial rPDA.  There is also moderate-severe disease involving the proximal and distal LCx as well as the distal RCA. Moderately elevated left heart, right heart, and pulmonary artery pressures (LVEDP 32 mmHg, PCWP 30 mmHg, mean RA 9 mmHg, RVEDP 14 mmHg, and mean PAP 37 mmHg). Low normal Fick cardiac output/index (CO 5.7 L/min, CI 2.5 L/min/m^2).  Admitted 02/05/22 due to acute on chronic heart failure. Initially given IV lasix and then midodrine due to soft BP. Elevated troponin thought to be due to demand ischemia. Cardiology and vascular consults obtained. Angiogram was done due to concern of PAD and no intervention was needed. Good arterial flow noted. LHC/RHC completed with above results. Cardiac MRI done and showed severely reduced Bi-ventricular function.  LVEF 16%, RVEF 19%. Findings consistent with ischemic cardiomyopathy with viable LV lateral and inferior walls. Non-viable LV anterior and apical Marcelis Wissner. Discharged after 24 days.   He presents today for a follow-up visit with a chief complaint of shortness of breath with exertion and swelling. Describes this as chronic in nature. Has associated cough, pedal edema (improving), palpitations, anxiety and leg pain along with this. He says he has occasional chest pain, abdominal distention especially when he is retaining fluid. Denies headaches or dizziness, constipation, nor diarrhea. Not adding salt and has been reading food labels for sodium content. Admits that it's overwhelming because he doesn't know what to eat  and his finances are limited so he has to pick more inexpensive foods.   Past Medical History:  Diagnosis Date   CHF (congestive heart failure) (HCC)    Coronary artery disease    Diabetes mellitus without complication ([place])    Hypertension    Past Surgical History:  Procedure Laterality Date   LOWER EXTREMITY ANGIOGRAPHY Left 02/14/2022   Procedure: Lower Extremity Angiography;  Surgeon: Algernon Huxley, MD;  Location: [facility];  Service: Cardiovascular;  Laterality: Left;   RIGHT/LEFT HEART CATH AND CORONARY ANGIOGRAPHY N/A 02/21/2022   Procedure: RIGHT/LEFT HEART CATH AND CORONARY ANGIOGRAPHY;  Surgeon: Nelva Bush, MD;  Location: [facility];  Service: Cardiovascular;  Laterality: N/A;   Family History  Problem Relation Age of Onset   Congestive Heart Failure Father    Social History   Tobacco Use   Smoking status: Former    Types: Cigarettes   Smokeless tobacco: Never  Substance Use Topics   Alcohol use: Never   No Known Allergies Prior to Admission medications   Medication Sig Start Date End Date Taking? Authorizing Provider  acetaminophen (TYLENOL) 500 MG tablet Take 2 tablets (1,000 mg total) by mouth every 6 (six) hours as needed for mild pain, moderate pain, fever or headache. 03/01/22  Yes Emeterio Reeve, DO  ALPRAZolam Duanne Moron) 1 MG tablet Take 0.5 mg by mouth 3 (three) times daily as needed for anxiety.   Yes [provider]  aspirin EC 81 MG tablet Take 1 tablet (81 mg total) by mouth daily. Swallow whole. 03/02/22  Yes Emeterio Reeve, DO  atorvastatin (LIPITOR) 80  MG tablet Take 1 tablet (80 mg total) by mouth daily. 03/30/22  Yes Furth, Cadence H, PA-C  bisacodyl (DULCOLAX) 5 MG EC tablet Take 2 tablets (10 mg total) by mouth daily as needed for moderate constipation. 03/01/22  Yes Emeterio Reeve, DO  dapagliflozin propanediol (FARXIGA) 10 MG TABS tablet Take one tablet by mouth daily 03/12/22  Yes   digoxin (LANOXIN) 0.125 MG  tablet Take 1 tablet (0.125 mg total) by mouth daily. 03/30/22  Yes Furth, Cadence H, PA-C  furosemide (LASIX) 40 MG tablet Take 1 tablet (40 mg total) by mouth daily. 04/11/22  Yes Furth, Cadence H, PA-C  metFORMIN (GLUCOPHAGE) 500 MG tablet Take 1 tablet (500 mg total) by mouth 2 (two) times daily. 03/30/22 04/29/22 Yes Furth, Cadence H, PA-C  methocarbamol (ROBAXIN) 750 MG tablet Take 1 tablet (750 mg total) by mouth every 6 (six) hours as needed for muscle spasms. 03/30/22  Yes Furth, Cadence H, PA-C  metoprolol succinate (TOPROL-XL) 25 MG 24 hr tablet Take 0.5 tablets (12.5 mg total) by mouth daily. 03/30/22  Yes Furth, Cadence H, PA-C  pantoprazole (PROTONIX) 40 MG tablet Take 1 tablet (40 mg total) by mouth daily. 03/30/22  Yes Furth, Cadence H, PA-C  potassium chloride SA (KLOR-CON M) 20 MEQ tablet Take 1 tablet (20 mEq total) by mouth daily. 03/30/22  Yes Furth, Cadence H, PA-C  pregabalin (LYRICA) 100 MG capsule Take 1 capsule (100 mg total) by mouth 3 (three) times daily. 03/30/22  Yes Furth, Cadence H, PA-C  pregabalin (LYRICA) 100 MG capsule Take 1 capsule by mouth three times a day 03/30/22  Yes Furth, Cadence H, PA-C  QUEtiapine (SEROQUEL) 25 MG tablet Take 1 tablet (25 mg total) by mouth at bedtime. 03/30/22  Yes Furth, Cadence H, PA-C  spironolactone (ALDACTONE) 25 MG tablet Take 0.5 tablets (12.5 mg total) by mouth daily. 03/30/22  Yes Furth, Cadence H, PA-C     Review of Systems  Constitutional:  Positive for fatigue (easily). Negative for appetite change.  HENT:  Negative for congestion, postnasal drip and sore throat.   Eyes: Negative.   Respiratory:  Positive for cough and shortness of breath. Negative for chest tightness and wheezing.   Cardiovascular:  Positive for chest pain, palpitations (at times) and leg swelling (improving).  Gastrointestinal:  Positive for abdominal distention (sometimes). Negative for abdominal pain.  Endocrine: Negative.   Genitourinary: Negative.    Musculoskeletal:  Positive for arthralgias (leg pain). Negative for back pain.  Skin: Negative.   Allergic/Immunologic: Negative.   Neurological:  Positive for light-headedness (intermittent). Negative for dizziness.  Hematological:  Negative for adenopathy. Does not bruise/bleed easily.  Psychiatric/Behavioral:  Negative for dysphoric mood and sleep disturbance (sleeping on 2 pillows). The patient is nervous/anxious.    Vitals:   04/13/22 1334  BP: 115/69  Pulse: 91  Resp: 20  SpO2: 98%   Filed Weights   04/13/22 1334  Weight: 242 lb 6 oz (109.9 kg)    Lab Results  Component Value Date   CREATININE 1.11 04/11/2022   CREATININE 1.11 03/16/2022   CREATININE 1.14 03/01/2022    Physical Exam Vitals and nursing note reviewed. Exam conducted with a chaperone present (mom).  Constitutional:      Appearance: Normal appearance.  HENT:     Head: Normocephalic and atraumatic.  Cardiovascular:     Rate and Rhythm: Normal rate and regular rhythm.  Pulmonary:     Effort: Pulmonary effort is normal. No respiratory distress.  Breath sounds: Normal breath sounds. No wheezing or rales.  Abdominal:     General: There is no distension.     Palpations: Abdomen is soft.  Musculoskeletal:        General: No tenderness.     Cervical back: Normal range of motion and neck supple.     Right lower leg: Edema (2+ pitting) present.     Left lower leg: Edema (2+ pitting) present.  Skin:    General: Skin is warm and dry.  Neurological:     General: No focal deficit present.     Mental Status: He is alert and oriented to person, place, and time.  Psychiatric:        Mood and Affect: Mood is anxious.        Behavior: Behavior normal.        Thought Content: Thought content normal.   Assessment & Plan:  1: Chronic heart failure with reduced ejection fraction- - NYHA class III - euvolemic today - weighing daily and says that his weight doesn't fluctuate more than 2-3 pounds; reminded to  call for an overnight weight gain of > 2 pounds or a weekly weight gain of > 5 pounds - weight today 242 lb up 5 lbs since last visit here on 03/16/22 - not adding salt but admits that finding low sodium foods that he can afford is difficult - on GDMT of farxiga, metoprolol & spironolactone - BP may not be able to tolerate entresto - filled out patient assistance for farxiga today in office - instructed to get compression socks and put them on every morning with removal at bedtime - edema does improve some overnight  - BNP 04/11/22 was 347.0  2: HTN- - BP  (115/69)  - to see PCP Hendricks Milo) at [facility] on 04/20/22 although he says that it is too far from home and he's looking to see someone in [place] - hoping to get approved for medicaid soon - BMP 04/11/22 reviewed and showed sodium 140, potassium 4.5, creatinine 1.11 and GFR >60  3: DM- - A1c 02/05/22 was 7.7% - patient to order a glucometer with assistance and begin monitoring his blood sugar at home  4: CAD-  - saw cardiology Kathlen Mody) 04/11/22 - has not smoked in > 2 month   Medication bottles reviewed.   Patient will call when he is ready to make an appointment as he's concerned about finances since he's currently uninsured.

## 2022-04-13 NOTE — Patient Instructions (Addendum)
Continue weighing daily and call for an overnight weight gain of 3 pounds or more or a weekly weight gain of more than 5 pounds.     [facility]  [address], [place], [place] [address] [phone]    Call us once you get your medicaid and we will schedule another appointment

## 2022-04-13 NOTE — Telephone Encounter (Signed)
Faxed patient assistance for Farxiga for patient today.  Remo Kirschenmann, NT

## 2022-04-16 ENCOUNTER — Other Ambulatory Visit: Payer: Self-pay | Admitting: *Deleted

## 2022-04-16 ENCOUNTER — Other Ambulatory Visit: Payer: Self-pay

## 2022-04-16 DIAGNOSIS — I5042 Chronic combined systolic (congestive) and diastolic (congestive) heart failure: Secondary | ICD-10-CM

## 2022-04-17 ENCOUNTER — Other Ambulatory Visit: Payer: Self-pay

## 2022-04-17 ENCOUNTER — Other Ambulatory Visit: Payer: Self-pay | Admitting: Medical

## 2022-04-17 MED ORDER — DAPAGLIFLOZIN PROPANEDIOL 10 MG PO TABS: ORAL_TABLET | ORAL | 3 refills | Status: DC

## 2022-04-18 ENCOUNTER — Other Ambulatory Visit: Payer: Self-pay | Admitting: Medical

## 2022-04-18 ENCOUNTER — Other Ambulatory Visit: Payer: Self-pay

## 2022-04-18 ENCOUNTER — Other Ambulatory Visit (HOSPITAL_COMMUNITY): Payer: Self-pay

## 2022-04-19 ENCOUNTER — Other Ambulatory Visit: Payer: Self-pay

## 2022-04-19 ENCOUNTER — Encounter: Payer: Self-pay | Admitting: Pharmacist

## 2022-04-19 ENCOUNTER — Other Ambulatory Visit: Payer: Self-pay | Admitting: Medical

## 2022-04-20 ENCOUNTER — Other Ambulatory Visit: Payer: Self-pay | Admitting: Medical

## 2022-04-20 ENCOUNTER — Other Ambulatory Visit: Payer: Self-pay

## 2022-04-24 ENCOUNTER — Other Ambulatory Visit: Payer: Self-pay | Admitting: Medical

## 2022-04-24 ENCOUNTER — Other Ambulatory Visit: Payer: Self-pay

## 2022-04-25 ENCOUNTER — Other Ambulatory Visit: Payer: Self-pay | Admitting: Medical

## 2022-04-25 ENCOUNTER — Other Ambulatory Visit: Payer: Self-pay

## 2022-04-26 ENCOUNTER — Other Ambulatory Visit: Payer: Self-pay | Admitting: Medical

## 2022-04-26 ENCOUNTER — Other Ambulatory Visit: Payer: Self-pay

## 2022-04-26 MED ORDER — PANTOPRAZOLE SODIUM 40 MG PO TBEC
40.0000 mg | DELAYED_RELEASE_TABLET | Freq: Every day | ORAL | 11 refills | Status: DC
  Filled 2022-04-26: qty 30, 30d supply, fill #0
  Filled 2022-05-21: qty 30, 30d supply, fill #1
  Filled 2022-06-18: qty 30, 30d supply, fill #2
  Filled 2022-07-23: qty 30, 30d supply, fill #3
  Filled 2022-08-17: qty 30, 30d supply, fill #4
  Filled 2022-09-17: qty 30, 30d supply, fill #5
  Filled 2022-10-15: qty 30, 30d supply, fill #6
  Filled 2022-11-12: qty 30, 30d supply, fill #7
  Filled 2022-12-10: qty 30, 30d supply, fill #8
  Filled 2023-01-13: qty 30, 30d supply, fill #9
  Filled 2023-02-12: qty 30, 30d supply, fill #10
  Filled 2023-03-13: qty 30, 30d supply, fill #11

## 2022-04-26 MED ORDER — METHOCARBAMOL 750 MG PO TABS
750.0000 mg | ORAL_TABLET | Freq: Four times a day (QID) | ORAL | 11 refills | Status: DC | PRN
  Filled 2022-04-26: qty 30, 8d supply, fill #0
  Filled 2022-05-07: qty 30, 8d supply, fill #1
  Filled 2022-05-21: qty 30, 8d supply, fill #2
  Filled 2022-05-30: qty 30, 8d supply, fill #3

## 2022-04-26 MED ORDER — METFORMIN HCL 500 MG PO TABS
500.0000 mg | ORAL_TABLET | Freq: Two times a day (BID) | ORAL | 11 refills | Status: DC
  Filled 2022-04-26: qty 60, 30d supply, fill #0
  Filled 2022-05-21: qty 60, 30d supply, fill #1

## 2022-04-26 MED ORDER — QUETIAPINE FUMARATE 25 MG PO TABS
25.0000 mg | ORAL_TABLET | Freq: Every day | ORAL | 11 refills | Status: DC
  Filled 2022-04-26: qty 30, 30d supply, fill #0
  Filled 2022-05-21 – 2022-05-26 (×3): qty 30, 30d supply, fill #1

## 2022-04-26 MED ORDER — SPIRONOLACTONE 25 MG PO TABS
12.5000 mg | ORAL_TABLET | Freq: Every day | ORAL | 11 refills | Status: DC
  Filled 2022-04-26: qty 30, 60d supply, fill #0
  Filled 2022-06-18: qty 30, 60d supply, fill #1

## 2022-04-26 MED ORDER — POTASSIUM CHLORIDE CRYS ER 20 MEQ PO TBCR
20.0000 meq | EXTENDED_RELEASE_TABLET | Freq: Every day | ORAL | 11 refills | Status: DC
  Filled 2022-04-26: qty 30, 30d supply, fill #0
  Filled 2022-05-21: qty 30, 30d supply, fill #1
  Filled 2022-06-18: qty 30, 30d supply, fill #2
  Filled 2022-07-23: qty 30, 30d supply, fill #3

## 2022-04-27 ENCOUNTER — Encounter: Payer: Self-pay | Admitting: Cardiology

## 2022-04-27 ENCOUNTER — Other Ambulatory Visit: Payer: Self-pay

## 2022-04-27 ENCOUNTER — Other Ambulatory Visit: Payer: Self-pay | Admitting: Medical

## 2022-04-27 ENCOUNTER — Encounter: Payer: Self-pay | Admitting: Family

## 2022-04-27 MED ORDER — DIGOXIN 125 MCG PO TABS
0.1250 mg | ORAL_TABLET | Freq: Every day | ORAL | 0 refills | Status: DC
  Filled 2022-04-27: qty 90, 90d supply, fill #0

## 2022-04-27 MED ORDER — ATORVASTATIN CALCIUM 80 MG PO TABS
80.0000 mg | ORAL_TABLET | Freq: Every day | ORAL | 0 refills | Status: DC
  Filled 2022-04-27: qty 90, 90d supply, fill #0

## 2022-04-29 ENCOUNTER — Other Ambulatory Visit: Payer: Self-pay

## 2022-04-30 ENCOUNTER — Other Ambulatory Visit: Payer: Self-pay

## 2022-05-03 ENCOUNTER — Encounter: Payer: Self-pay | Admitting: Nurse Practitioner

## 2022-05-03 ENCOUNTER — Ambulatory Visit (INDEPENDENT_AMBULATORY_CARE_PROVIDER_SITE_OTHER): Payer: Medicaid Other | Admitting: Nurse Practitioner

## 2022-05-03 VITALS — BP 111/71 | HR 76 | Ht 72.0 in | Wt 247.3 lb

## 2022-05-03 DIAGNOSIS — I5042 Chronic combined systolic (congestive) and diastolic (congestive) heart failure: Secondary | ICD-10-CM

## 2022-05-03 DIAGNOSIS — F17200 Nicotine dependence, unspecified, uncomplicated: Secondary | ICD-10-CM

## 2022-05-03 DIAGNOSIS — E1165 Type 2 diabetes mellitus with hyperglycemia: Secondary | ICD-10-CM

## 2022-05-03 DIAGNOSIS — I2510 Atherosclerotic heart disease of native coronary artery without angina pectoris: Secondary | ICD-10-CM

## 2022-05-03 DIAGNOSIS — R600 Localized edema: Secondary | ICD-10-CM

## 2022-05-03 DIAGNOSIS — G6289 Other specified polyneuropathies: Secondary | ICD-10-CM

## 2022-05-03 DIAGNOSIS — I10 Essential (primary) hypertension: Secondary | ICD-10-CM | POA: Insufficient documentation

## 2022-05-03 DIAGNOSIS — F419 Anxiety disorder, unspecified: Secondary | ICD-10-CM | POA: Diagnosis not present

## 2022-05-03 DIAGNOSIS — I5022 Chronic systolic (congestive) heart failure: Secondary | ICD-10-CM | POA: Insufficient documentation

## 2022-05-03 LAB — GLUCOSE, POCT (MANUAL RESULT ENTRY): POC Glucose: 138 mg/dl — AB (ref 70–99)

## 2022-05-03 NOTE — Progress Notes (Signed)
New Patient Office Visit  Subjective    Patient ID: Lawrence Murray, male    DOB: [birth year]  Age: 56 y.o. MRN: [id number]  CC:  Chief Complaint  Patient presents with   New Patient (Initial Visit)    Patient here to establish care. Patient has not had a pcp since the 90's. Patient spent weeks in the hospital with cardiac issues and new dx of diabetes.     HPI Lawrence Murray presents to establish care.  He has not been to the PCP in years.  Patient states that he was self diagnosing and treating himself while he was busy taking care of his father.  He has history of CHF, obesity, hypertension, anxiety, diabetes and diabetes related peripheral neuropathy. Patient denies SOB, chest pain at present. He has some swelling in lower extremity bilaterally.  Outpatient Encounter Medications as of 05/03/2022  Medication Sig   Accu-Chek Softclix Lancets lancets Use as directed to check blood glucose daily   acetaminophen (TYLENOL) 500 MG tablet Take 2 tablets (1,000 mg total) by mouth every 6 (six) hours as needed for mild pain, moderate pain, fever or headache.   aspirin EC 81 MG tablet Take 1 tablet (81 mg total) by mouth daily. Swallow whole.   atorvastatin (LIPITOR) 80 MG tablet Take 1 tablet (80 mg total) by mouth daily.   bisacodyl (DULCOLAX) 5 MG EC tablet Take 2 tablets (10 mg total) by mouth daily as needed for moderate constipation.   Blood Glucose Monitoring Suppl (ACCU-CHEK GUIDE ME) w/Device KIT Use as directed daily   dapagliflozin propanediol (FARXIGA) 10 MG TABS tablet Take one tablet by mouth daily   digoxin (LANOXIN) 0.125 MG tablet Take 1 tablet (0.125 mg total) by mouth daily.   furosemide (LASIX) 40 MG tablet Take 1 tablet (40 mg total) by mouth daily.   glucose blood test strip Use as directed once daily   metFORMIN (GLUCOPHAGE) 500 MG tablet Take 1 tablet (500 mg total) by mouth 2 (two) times daily.   methocarbamol (ROBAXIN) 750 MG tablet Take 1 tablet (750 mg total) by mouth  every 6 (six) hours as needed for muscle spasms.   metoprolol succinate (TOPROL-XL) 25 MG 24 hr tablet Take 0.5 tablets (12.5 mg total) by mouth daily.   pantoprazole (PROTONIX) 40 MG tablet Take 1 tablet (40 mg total) by mouth daily.   potassium chloride SA (KLOR-CON M) 20 MEQ tablet Take 1 tablet (20 mEq total) by mouth daily.   QUEtiapine (SEROQUEL) 25 MG tablet Take 1 tablet (25 mg total) by mouth at bedtime.   spironolactone (ALDACTONE) 25 MG tablet Take 0.5 tablets (12.5 mg total) by mouth daily.   [DISCONTINUED] ALPRAZolam (XANAX) 1 MG tablet Take 0.5 mg by mouth 3 (three) times daily as needed for anxiety.   [DISCONTINUED] pregabalin (LYRICA) 100 MG capsule Take 1 capsule by mouth three times a day   [DISCONTINUED] dapagliflozin propanediol (FARXIGA) 10 MG TABS tablet Take one tablet by mouth daily   [DISCONTINUED] pregabalin (LYRICA) 100 MG capsule Take 1 capsule (100 mg total) by mouth 3 (three) times daily.   No facility-administered encounter medications on file as of 05/03/2022.    Past Medical History:  Diagnosis Date   CHF (congestive heart failure) (HCC)    Coronary artery disease    Diabetes mellitus without complication ([place])    Hypertension     Past Surgical History:  Procedure Laterality Date   LOWER EXTREMITY ANGIOGRAPHY Left 02/14/2022   Procedure: Lower Extremity Angiography;  Surgeon: Algernon Huxley, MD;  Location: [place] [facility];  Service: Cardiovascular;  Laterality: Left;   RIGHT/LEFT HEART CATH AND CORONARY ANGIOGRAPHY N/A 02/21/2022   Procedure: RIGHT/LEFT HEART CATH AND CORONARY ANGIOGRAPHY;  Surgeon: Nelva Bush, MD;  Location: [place] [facility];  Service: Cardiovascular;  Laterality: N/A;    Family History  Problem Relation Age of Onset   Congestive Heart Failure Father     Social History   Socioeconomic History   Marital status: Single    Spouse name: Not on file   Number of children: Not on file   Years of education: Not on file    Highest education level: Not on file  Occupational History   Not on file  Tobacco Use   Smoking status: Former    Packs/day: 1.00    Years: 23.00    Total pack years: 23.00    Types: Cigarettes    Quit date: 02/04/2022    Years since quitting: 0.2   Smokeless tobacco: Never  Vaping Use   Vaping Use: Not on file  Substance and Sexual Activity   Alcohol use: Not Currently   Drug use: Never   Sexual activity: Not Currently  Other Topics Concern   Not on file  Social History Narrative   Not on file   Social Determinants of Health   Financial Resource Strain: High Risk (03/09/2022)   Overall Financial Resource Strain (CARDIA)    Difficulty of Paying Living Expenses: Hard  Food Insecurity: Food Insecurity Present (03/09/2022)   Hunger Vital Sign    Worried About Running Out of Food in the Last Year: Sometimes true    Ran Out of Food in the Last Year: Sometimes true  Transportation Needs: No Transportation Needs (03/09/2022)   PRAPARE - Hydrologist (Medical): No    Lack of Transportation (Non-Medical): No  Physical Activity: Not on file  Stress: Not on file  Social Connections: Not on file  Intimate Partner Violence: Not on file    Review of Systems  Constitutional: Negative.   HENT: Negative.    Eyes: Negative.   Respiratory:  Negative for shortness of breath.   Cardiovascular:  Positive for leg swelling. Negative for chest pain.  Gastrointestinal: Negative.   Genitourinary: Negative.   Musculoskeletal: Negative.   Neurological: Negative.   Psychiatric/Behavioral:  Negative for depression, substance abuse and suicidal ideas. The patient is not nervous/anxious.         Objective    BP 111/71   Pulse 76   Ht 6' (1.829 m)   Wt 247 lb 4.8 oz (112.2 kg)   BMI 33.54 kg/m   Physical Exam Constitutional:      Appearance: Normal appearance. He is obese.  HENT:     Right Ear: Tympanic membrane normal.     Left Ear: Tympanic membrane  normal.     Mouth/Throat:     Mouth: Mucous membranes are moist.  Eyes:     Extraocular Movements: Extraocular movements intact.     Conjunctiva/sclera: Conjunctivae normal.     Pupils: Pupils are equal, round, and reactive to light.  Cardiovascular:     Rate and Rhythm: Normal rate and regular rhythm.     Heart sounds: No murmur heard.    No friction rub.  Pulmonary:     Effort: No respiratory distress.  Abdominal:     General: Bowel sounds are normal. There is distension.     Palpations: Abdomen is soft. There is  no mass.     Hernia: No hernia is present.  Musculoskeletal:        General: No signs of injury. Normal range of motion.     Right lower leg: Edema present.     Left lower leg: Edema present.  Skin:    General: Skin is warm.     Capillary Refill: Capillary refill takes less than 2 seconds.  Neurological:     General: No focal deficit present.     Mental Status: He is alert and oriented to person, place, and time. Mental status is at baseline.  Psychiatric:        Mood and Affect: Mood normal.        Behavior: Behavior normal.        Thought Content: Thought content normal.        Judgment: Judgment normal.         Assessment & Plan:   Problem List Items Addressed This Visit       Cardiovascular and Mediastinum   Chronic combined systolic and diastolic congestive heart failure ([place])    He is followed by cardiologist.        Endocrine   Type 2 diabetes mellitus with hyperglycemia ([place]) - Primary    His last hemoglobin A1c 7.7 on 02/05/2022. His blood sugar 139 in the office today. Advised pt to check the BS regularly, make a log and bring to next appointment.  Advised pt to monitor diet. Advised pt to eat variety of food including fruits, vegetables, whole grains, complex carbohydrates and proteins.  Referral sent for diabetes education. Continue Farxiga 10 mg and metformin 500 mg twice daily.       Relevant Orders   POCT glucose (manual entry)  (Completed)   Ambulatory referral to Ophthalmology   Ambulatory referral to Gastroenterology   Ambulatory referral to Podiatry   Microalbumin, urine (Completed)   Referral to Nutrition and Diabetes Services     Nervous and Auditory   Peripheral neuropathy    Patient states that gabapentin does not help with his symptoms. He states Lyrica helped to stabilize her symptoms. Continue Lyrica        Other   Lower extremity edema    Bilateral lower extremity edema. Advised patient to keep the feet elevated and wear compression stockings.      Anxiety    Stable with medication. We will refill Xanax       No follow-ups on file.   Theresia Lo, NP

## 2022-05-04 ENCOUNTER — Other Ambulatory Visit: Payer: Self-pay | Admitting: Nurse Practitioner

## 2022-05-04 ENCOUNTER — Other Ambulatory Visit: Payer: Self-pay

## 2022-05-04 ENCOUNTER — Other Ambulatory Visit: Payer: Self-pay | Admitting: Medical

## 2022-05-04 DIAGNOSIS — I5043 Acute on chronic combined systolic (congestive) and diastolic (congestive) heart failure: Secondary | ICD-10-CM

## 2022-05-04 LAB — MICROALBUMIN, URINE: Microalb, Ur: 0.3 mg/dL

## 2022-05-04 MED ORDER — ALPRAZOLAM 1 MG PO TABS
0.5000 mg | ORAL_TABLET | Freq: Three times a day (TID) | ORAL | 0 refills | Status: DC | PRN
  Filled 2022-05-04: qty 45, 30d supply, fill #0
  Filled 2022-06-12: qty 45, 30d supply, fill #1

## 2022-05-04 NOTE — Telephone Encounter (Signed)
Please advise non cardiac refill for Lyrica 100 mg capsule tid. Last filled by Cadence.

## 2022-05-07 ENCOUNTER — Other Ambulatory Visit: Payer: Self-pay | Admitting: Medical

## 2022-05-07 ENCOUNTER — Other Ambulatory Visit: Payer: Self-pay | Admitting: Nurse Practitioner

## 2022-05-07 ENCOUNTER — Other Ambulatory Visit: Payer: Self-pay

## 2022-05-08 ENCOUNTER — Other Ambulatory Visit: Payer: Self-pay

## 2022-05-09 ENCOUNTER — Other Ambulatory Visit: Payer: Self-pay

## 2022-05-09 MED ORDER — PREGABALIN 100 MG PO CAPS
ORAL_CAPSULE | ORAL | 0 refills | Status: DC
  Filled 2022-05-09: qty 90, 30d supply, fill #0

## 2022-05-10 ENCOUNTER — Other Ambulatory Visit: Payer: Self-pay

## 2022-05-11 ENCOUNTER — Encounter: Payer: Medicaid Other | Attending: Nurse Practitioner | Admitting: *Deleted

## 2022-05-11 ENCOUNTER — Telehealth: Payer: Self-pay | Admitting: Cardiology

## 2022-05-11 ENCOUNTER — Telehealth: Payer: Self-pay

## 2022-05-11 ENCOUNTER — Other Ambulatory Visit: Payer: Self-pay

## 2022-05-11 ENCOUNTER — Encounter: Payer: Self-pay | Admitting: *Deleted

## 2022-05-11 VITALS — BP 110/64 | Ht 72.0 in | Wt 245.0 lb

## 2022-05-11 DIAGNOSIS — Z1211 Encounter for screening for malignant neoplasm of colon: Secondary | ICD-10-CM

## 2022-05-11 DIAGNOSIS — E1159 Type 2 diabetes mellitus with other circulatory complications: Secondary | ICD-10-CM | POA: Insufficient documentation

## 2022-05-11 MED ORDER — GOLYTELY 236 G PO SOLR
4000.0000 mL | Freq: Once | ORAL | 0 refills | Status: AC
  Filled 2022-05-11: qty 4000, 1d supply, fill #0

## 2022-05-11 NOTE — Telephone Encounter (Signed)
Gastroenterology Pre-Procedure Review  Request Date: 06/14/22 Requesting Physician: Dr. Vicente Males  PATIENT REVIEW QUESTIONS: The patient responded to the following health history questions as indicated:    1. Are you having any GI issues? no thinks he may have a hernia, experiences indigestion 2. Do you have a personal history of Polyps? no 3. Do you have a family history of Colon Cancer or Polyps? yes (uncle colon cancer) 4. Diabetes Mellitus? yes (has been advised to hold metformin 2 days) 5. Joint replacements in the past 12 months?no 6. Major health problems in the past 3 months?yes (02/05/22 CHF Admission Cardiologist Dr. Saunders Revel) 7. Any artificial heart valves, MVP, or defibrillator?no    MEDICATIONS & ALLERGIES:    Patient reports the following regarding taking any anticoagulation/antiplatelet therapy:   Plavix, Coumadin, Eliquis, Xarelto, Lovenox, Pradaxa, Brilinta, or Effient? no Aspirin? yes (81 mg daily)  Patient confirms/reports the following medications:  Current Outpatient Medications  Medication Sig Dispense Refill   Accu-Chek Softclix Lancets lancets Use as directed to check blood glucose daily 100 each 0   acetaminophen (TYLENOL) 500 MG tablet Take 2 tablets (1,000 mg total) by mouth every 6 (six) hours as needed for mild pain, moderate pain, fever or headache. 30 tablet 0   ALPRAZolam (XANAX) 1 MG tablet Take 0.5 tablets (0.5 mg total) by mouth 3 (three) times daily as needed for anxiety. 90 tablet 0   aspirin EC 81 MG tablet Take 1 tablet (81 mg total) by mouth daily. Swallow whole. 30 tablet 12   atorvastatin (LIPITOR) 80 MG tablet Take 1 tablet (80 mg total) by mouth daily. 90 tablet 0   bisacodyl (DULCOLAX) 5 MG EC tablet Take 2 tablets (10 mg total) by mouth daily as needed for moderate constipation. 30 tablet 0   Blood Glucose Monitoring Suppl (ACCU-CHEK GUIDE ME) w/Device KIT Use as directed daily 1 kit 0   dapagliflozin propanediol (FARXIGA) 10 MG TABS tablet Take one  tablet by mouth daily 90 tablet 3   digoxin (LANOXIN) 0.125 MG tablet Take 1 tablet (0.125 mg total) by mouth daily. 90 tablet 0   furosemide (LASIX) 40 MG tablet Take 1 tablet (40 mg total) by mouth daily. 30 tablet 0   glucose blood test strip Use as directed once daily 100 each 0   metFORMIN (GLUCOPHAGE) 500 MG tablet Take 1 tablet (500 mg total) by mouth 2 (two) times daily. 60 tablet 11   methocarbamol (ROBAXIN) 750 MG tablet Take 1 tablet (750 mg total) by mouth every 6 (six) hours as needed for muscle spasms. 30 tablet 11   metoprolol succinate (TOPROL-XL) 25 MG 24 hr tablet Take 0.5 tablets (12.5 mg total) by mouth daily. 30 tablet 0   pantoprazole (PROTONIX) 40 MG tablet Take 1 tablet (40 mg total) by mouth daily. 30 tablet 11   potassium chloride SA (KLOR-CON M) 20 MEQ tablet Take 1 tablet (20 mEq total) by mouth daily. 30 tablet 11   pregabalin (LYRICA) 100 MG capsule Take 1 capsule by mouth three times a day 90 capsule 0   QUEtiapine (SEROQUEL) 25 MG tablet Take 1 tablet (25 mg total) by mouth at bedtime. 30 tablet 11   spironolactone (ALDACTONE) 25 MG tablet Take 0.5 tablets (12.5 mg total) by mouth daily. 30 tablet 11   No current facility-administered medications for this visit.    Patient confirms/reports the following allergies:  No Known Allergies  No orders of the defined types were placed in this encounter.   AUTHORIZATION INFORMATION  Primary Insurance: 1D#: Group #:  Secondary Insurance: 1D#: Group #:  SCHEDULE INFORMATION: Date: 06/14/22 Time: Location: [facility]

## 2022-05-11 NOTE — Progress Notes (Signed)
Diabetes Self-Management Education  Visit Type: First/Initial  Appt. Start Time: 1335 Appt. End Time: 1430  05/11/2022  [name], identified by name and date of birth, is a 56 y.o. male with a diagnosis of Diabetes: Type 2.   ASSESSMENT  Blood pressure 110/64, height 6' (1.829 m), weight 245 lb (111.1 kg). Body mass index is 33.23 kg/m.   Diabetes Self-Management Education - 05/11/22 1526       Visit Information   Visit Type First/Initial      Initial Visit   Diabetes Type Type 2    Date Diagnosed June 2023 when he was in the hospital    Are you currently following a meal plan? Yes    What type of meal plan do you follow? "low sodium, low sugar"    Are you taking your medications as prescribed? Yes      Health Coping   How would you rate your overall health? Very Poor      Psychosocial Assessment   Patient Belief/Attitude about Diabetes Defeat/Burnout    What is the hardest part about your diabetes right now, causing you the most concern, or is the most worrisome to you about your diabetes?   Making healty food and beverage choices;Getting support / problem solving;Taking/obtaining medications;Checking blood sugar;Being active    Self-care barriers None    Self-management support Doctor's office;Family    Patient Concerns Nutrition/Meal planning;Medication;Monitoring;Healthy Lifestyle;Problem Solving;Glycemic Control;Weight Control    Special Needs None    Preferred Learning Style Auditory;Visual;Hands on    Learning Readiness Not Ready    How often do you need to have someone help you when you read instructions, pamphlets, or other written materials from your doctor or pharmacy? 1 - Never    What is the last grade level you completed in school? high school      Pre-Education Assessment   Patient understands the diabetes disease and treatment process. Needs Instruction    Patient understands incorporating nutritional management into lifestyle. Needs Instruction     Patient undertands incorporating physical activity into lifestyle. Needs Instruction    Patient understands using medications safely. Needs Instruction    Patient understands monitoring blood glucose, interpreting and using results Needs Instruction    Patient understands prevention, detection, and treatment of acute complications. Needs Instruction    Patient understands prevention, detection, and treatment of chronic complications. Needs Instruction    Patient understands how to develop strategies to address psychosocial issues. Needs Instruction    Patient understands how to develop strategies to promote health/change behavior. Needs Instruction      Complications   Last HgB A1C per patient/outside source 7.7 %   02/05/2022   How often do you check your blood sugar? 0 times/day (not testing)   waiting to get lancets today   Have you had a dilated eye exam in the past 12 months? No    Have you had a dental exam in the past 12 months? No    Are you checking your feet? Yes    How many days per week are you checking your feet? 7      Dietary Intake   Breakfast eats 1 meal and 3 snacks/day    Snack (morning) 2:00 am - oatmeal cookies, peanut butter waffle, poptarts    Lunch 3:00 pm - banana    Dinner 7:00 pm - chicken, beef, pork; peas, beans, green beans, occasional pasta, salads with lettuce, tomatoes, carrots, cuccumbers, onions, cheese    Snack (evening) 10:00  pm - ice cream, fruit (watermelon, cantaloup, pineapple, honeydew, oranges, grapes)    Beverage(s) water, milk      Activity / Exercise   Activity / Exercise Type ADL's      Patient Education   Previous Diabetes Education No    Disease Pathophysiology Explored patient's options for treatment of their diabetes    Healthy Eating Role of diet in the treatment of diabetes and the relationship between the three main macronutrients and blood glucose level;Food label reading, portion sizes and measuring food.;Reviewed blood glucose  goals for pre and post meals and how to evaluate the patients' food intake on their blood glucose level.    Being Active Role of exercise on diabetes management, blood pressure control and cardiac health.    Medications Reviewed patients medication for diabetes, action, purpose, timing of dose and side effects.    Monitoring Identified appropriate SMBG and/or A1C goals.    Chronic complications Relationship between chronic complications and blood glucose control    Diabetes Stress and Support Identified and addressed patients feelings and concerns about diabetes;Role of stress on diabetes      Individualized Goals (developed by patient)   Reducing Risk Other (comment)   improve blood sugars, decrease medications, prevent diabetse complications, lose weight, lead a healthier lifestyle, become more fit     Outcomes   Expected Outcomes Demonstrated limited interest in learning.  Expect minimal changes    Program Status Not Completed         Individualized Plan for Diabetes Self-Management Training:   Learning Objective:  Patient will have a greater understanding of diabetes self-management. Patient education plan is to attend individual and/or group sessions per assessed needs and concerns.   Plan:   Patient Instructions  Check blood sugars 1 x day before breakfast or 2 hrs after supper every day Bring blood sugar records to the next MD appointment  Exercise:  Walk as tolerated  Eat 3 meals day,   2  snacks a day Space meals 4-6 hours apart Include 1 serving of protein when eating fruit for a snack Limit desserts/sweets  Make an eye doctor appointment  Call back if you want to schedule Diabetes classes or an appointment with the nurse or dietitian  Expected Outcomes:  Demonstrated limited interest in learning.  Expect minimal changes  Education material provided:  General Meal Planning Guidelines Simple Meal Plan  If problems or questions, patient to contact team via:    Johny Drilling, RN, CCM, [place] [phone]  Future DSME appointment: PRN

## 2022-05-11 NOTE — Telephone Encounter (Signed)
   Pre-operative Risk Assessment    Patient Name: Lawrence Murray  DOB: [birth year] MRN: [id number]      Request for Surgical Clearance    Procedure:   Colonoscopy   Date of Surgery:  Clearance 06/14/22                                 Surgeon:  not indicated Surgeon's Group or Practice Name:  [facility] Phone number:  [phone] Fax number:  [phone]   Type of Clearance Requested:   - Medical    Type of Anesthesia:  Not Indicated   Additional requests/questions:    Manfred Arch   05/11/2022, 3:53 PM

## 2022-05-11 NOTE — Patient Instructions (Signed)
Check blood sugars 1 x day before breakfast or 2 hrs after supper every day Bring blood sugar records to the next MD appointment  Exercise:  Walk as tolerated  Eat 3 meals day,   2  snacks a day Space meals 4-6 hours apart Include 1 serving of protein when eating fruit for a snack Limit desserts/sweets  Make an eye doctor appointment  Call back if you want to schedule Diabetes classes or an appointment with the nurse or dietitian

## 2022-05-15 ENCOUNTER — Ambulatory Visit: Payer: Medicaid Other | Admitting: Podiatry

## 2022-05-15 DIAGNOSIS — E119 Type 2 diabetes mellitus without complications: Secondary | ICD-10-CM

## 2022-05-15 NOTE — Progress Notes (Signed)
   Chief Complaint  Patient presents with   foot care    Patient is here for diabetic foot care.     HPI: 56 y.o. male PMHx DM type II with peripheral polyneuropathy presenting today as a new patient referral from his PCP for routine diabetic foot exam.  Patient has a history of CHF with bilateral lower extremity swelling and edema currently controlled.  Past Medical History:  Diagnosis Date   CHF (congestive heart failure) (HCC)    Coronary artery disease    Diabetes mellitus without complication ([place])    Hypertension     Past Surgical History:  Procedure Laterality Date   LOWER EXTREMITY ANGIOGRAPHY Left 02/14/2022   Procedure: Lower Extremity Angiography;  Surgeon: Algernon Huxley, MD;  Location: [facility];  Service: Cardiovascular;  Laterality: Left;   RIGHT/LEFT HEART CATH AND CORONARY ANGIOGRAPHY N/A 02/21/2022   Procedure: RIGHT/LEFT HEART CATH AND CORONARY ANGIOGRAPHY;  Surgeon: Nelva Bush, MD;  Location: [facility];  Service: Cardiovascular;  Laterality: N/A;    No Known Allergies   Physical Exam: General: The patient is alert and oriented x3 in no acute distress.  Dermatology: Skin is warm, dry and supple bilateral lower extremities. Negative for open lesions or macerations.  Vascular: Palpable pedal pulses bilaterally. Capillary refill within normal limits.  Negative for any significant edema today.  Skin is warm to touch  Neurological: Light touch and protective threshold diminished  Musculoskeletal Exam: No pedal deformities noted  Assessment: 1.  Diabetes mellitus with peripheral polyneuropathy 2.  Encounter for diabetic foot exam   Plan of Care:  1. Patient evaluated.  2.  Comprehensive diabetic foot exam performed today. 3.  Continue wearing good supportive shoes and sneakers daily For rate recommend compression hose daily.  Patient states that he has a pair at home 4.  Continue close management with PCP for diabetes control 5.   Return to clinic annually     Edrick Kins, DPM [facility]  Dr. Edrick Kins, DPM    [address], [place] [address]                Office [phone]  Fax [phone]

## 2022-05-17 ENCOUNTER — Encounter: Payer: Self-pay | Admitting: Nurse Practitioner

## 2022-05-17 ENCOUNTER — Encounter: Payer: Self-pay | Admitting: Podiatry

## 2022-05-18 ENCOUNTER — Other Ambulatory Visit: Payer: Self-pay | Admitting: *Deleted

## 2022-05-18 DIAGNOSIS — I739 Peripheral vascular disease, unspecified: Secondary | ICD-10-CM

## 2022-05-20 DIAGNOSIS — R600 Localized edema: Secondary | ICD-10-CM | POA: Insufficient documentation

## 2022-05-20 DIAGNOSIS — F419 Anxiety disorder, unspecified: Secondary | ICD-10-CM | POA: Insufficient documentation

## 2022-05-20 DIAGNOSIS — G629 Polyneuropathy, unspecified: Secondary | ICD-10-CM | POA: Insufficient documentation

## 2022-05-20 NOTE — Assessment & Plan Note (Signed)
Patient states that gabapentin does not help with his symptoms. He states Lyrica helped to stabilize her symptoms. Continue Lyrica

## 2022-05-20 NOTE — Assessment & Plan Note (Signed)
>>  ASSESSMENT AND PLAN FOR TYPE 2 DIABETES MELLITUS WITH HYPERGLYCEMIA (HCC) WRITTEN ON 05/20/2022  1:51 AM BY Kara Dies, NP  His last hemoglobin A1c 7.7 on 02/05/2022. His blood sugar 139 in the office today. Advised pt to check the BS regularly, make a log and bring to next appointment.  Advised pt to monitor diet. Advised pt to eat variety of food including fruits, vegetables, whole grains, complex carbohydrates and proteins.  Referral sent for diabetes education. Continue Farxiga 10 mg and metformin 500 mg twice daily.

## 2022-05-20 NOTE — Assessment & Plan Note (Addendum)
His last hemoglobin A1c 7.7 on 02/05/2022. His blood sugar 139 in the office today. Advised pt to check the BS regularly, make a log and bring to next appointment.  Advised pt to monitor diet. Advised pt to eat variety of food including fruits, vegetables, whole grains, complex carbohydrates and proteins.  Referral sent for diabetes education. Continue Farxiga 10 mg and metformin 500 mg twice daily.

## 2022-05-20 NOTE — Assessment & Plan Note (Signed)
Stable with medication. We will refill Xanax

## 2022-05-20 NOTE — Assessment & Plan Note (Signed)
He is followed by cardiologist.

## 2022-05-20 NOTE — Assessment & Plan Note (Signed)
Bilateral lower extremity edema. Advised patient to keep the feet elevated and wear compression stockings.

## 2022-05-21 ENCOUNTER — Other Ambulatory Visit: Payer: Self-pay

## 2022-05-21 ENCOUNTER — Other Ambulatory Visit: Payer: Self-pay | Admitting: Medical

## 2022-05-21 ENCOUNTER — Encounter: Payer: Medicaid Other | Attending: Nurse Practitioner

## 2022-05-21 DIAGNOSIS — I5040 Unspecified combined systolic (congestive) and diastolic (congestive) heart failure: Secondary | ICD-10-CM | POA: Insufficient documentation

## 2022-05-21 DIAGNOSIS — I5042 Chronic combined systolic (congestive) and diastolic (congestive) heart failure: Secondary | ICD-10-CM

## 2022-05-21 NOTE — Progress Notes (Signed)
Virtual Visit completed. Patient informed on EP and RD appointment and 6 Minute walk test. Patient also informed of patient health questionnaires on My Chart. Patient Verbalizes understanding. Visit diagnosis can be found in [facility] 04/11/2022.

## 2022-05-22 ENCOUNTER — Other Ambulatory Visit: Payer: Self-pay | Admitting: Podiatry

## 2022-05-22 ENCOUNTER — Other Ambulatory Visit: Payer: Self-pay

## 2022-05-22 DIAGNOSIS — R600 Localized edema: Secondary | ICD-10-CM

## 2022-05-22 MED ORDER — FUROSEMIDE 40 MG PO TABS
40.0000 mg | ORAL_TABLET | Freq: Every day | ORAL | 0 refills | Status: DC
  Filled 2022-05-22: qty 30, 30d supply, fill #0

## 2022-05-22 NOTE — Telephone Encounter (Signed)
This has already been taken care of by the pt pcp.

## 2022-05-25 ENCOUNTER — Other Ambulatory Visit: Payer: Self-pay

## 2022-05-27 ENCOUNTER — Other Ambulatory Visit: Payer: Self-pay

## 2022-05-28 ENCOUNTER — Encounter: Payer: Self-pay | Admitting: Medical

## 2022-05-28 ENCOUNTER — Other Ambulatory Visit: Payer: Self-pay

## 2022-05-28 ENCOUNTER — Ambulatory Visit: Payer: Medicaid Other | Attending: Medical | Admitting: Medical

## 2022-05-28 VITALS — BP 102/72 | HR 92 | Ht 72.0 in | Wt 252.0 lb

## 2022-05-28 DIAGNOSIS — I25118 Atherosclerotic heart disease of native coronary artery with other forms of angina pectoris: Secondary | ICD-10-CM

## 2022-05-28 DIAGNOSIS — I5043 Acute on chronic combined systolic (congestive) and diastolic (congestive) heart failure: Secondary | ICD-10-CM | POA: Diagnosis not present

## 2022-05-28 DIAGNOSIS — E782 Mixed hyperlipidemia: Secondary | ICD-10-CM | POA: Diagnosis not present

## 2022-05-28 DIAGNOSIS — E119 Type 2 diabetes mellitus without complications: Secondary | ICD-10-CM

## 2022-05-28 MED ORDER — FUROSEMIDE 40 MG PO TABS
60.0000 mg | ORAL_TABLET | Freq: Every day | ORAL | 0 refills | Status: DC
  Filled 2022-05-28: qty 270, 180d supply, fill #0
  Filled 2022-05-28: qty 135, 90d supply, fill #0

## 2022-05-28 NOTE — Patient Instructions (Signed)
Medication Instructions:   INCREASE Lasix - take one and a half tablets (60 mg) by mouth daily.   *If you need a refill on your cardiac medications before your next appointment, please call your pharmacy*   Lab Work:  Your physician recommends you have lab work done at the medical mall in 2 weeks. No appt is needed.   If you have labs (blood work) drawn today and your tests are completely normal, you will receive your results only by: [place] (if you have MyChart) OR A paper copy in the mail If you have any lab test that is abnormal or we need to change your treatment, we will call you to review the results.   Testing/Procedures:  Echocardiogram   Your physician has requested that you have an echocardiogram. Echocardiography is a painless test that uses sound waves to create images of your heart. It provides your doctor with information about the size and shape of your heart and how well your heart's chambers and valves are working. This procedure takes approximately one hour. There are no restrictions for this procedure. Please note; depending on visual quality an IV may need to be placed.    Follow-Up: At [facility], you and your health needs are our priority.  As part of our continuing mission to provide you with exceptional heart care, we have created designated Provider Care Teams.  These Care Teams include your primary Cardiologist (physician) and Advanced Practice Providers (APPs -  Physician Assistants and Nurse Practitioners) who all work together to provide you with the care you need, when you need it.  We recommend signing up for the patient portal called "MyChart".  Sign up information is provided on this After Visit Summary.  MyChart is used to connect with patients for Virtual Visits (Telemedicine).  Patients are able to view lab/test results, encounter notes, upcoming appointments, etc.  Non-urgent messages can be sent to your provider as well.   To learn  more about what you can do with MyChart, go to [email or web address].    Your next appointment:   3 month(s)  The format for your next appointment:   In Person  Provider:   You may see Kate Sable, MD or one of the following Advanced Practice Providers on your designated Care Team:   Murray Hodgkins, NP Christell Faith, PA-C Cadence Kathlen Mody, PA-C Gerrie Nordmann, NP

## 2022-05-28 NOTE — Addendum Note (Signed)
Addended by: Ulice Brilliant T on: 05/28/2022 02:46 PM   Modules accepted: Orders

## 2022-05-28 NOTE — Progress Notes (Signed)
Cardiology Office Note:    Date:  05/28/2022   ID:  Lawrence Murray, DOB August 30, 1965, MRN [id number]  PCP:  Theresia Lo, NP  [facility] Cardiologist:  Kate Sable, MD  [place] Electrophysiologist:  None   Referring MD: No ref. provider found   Chief Complaint: 6 week follow-up  History of Present Illness:    Lawrence Murray is a 56 y.o. male with a hx of obesity, smoker who is being seen 02/07/2022 for the evaluation of shortness of breath.   The patient was admitted 01/2022 for new onset heart failure. Echo showed LVEF 30-35%. Patient underwent angiogram with vascular surgery on 6/28.  Good arterial flow noted.  No intervention was indicated. He was treated with IV lasix. L/R heart cath showed 3V CAD. Left and right pressures were high. Cardiac MRI showed severely reduced Bi-V function. LVEF 16%, ICM with viable LV lateral and inferior walls. Non-viable LV anterior and apical wall. Not felt to benefit from CABG. IV lasix>lasix 51m daily.   Last seen 04/11/22 and was overall doing OK. He had lower leg edema and lasix was increased for 3 days.   Today, the patient is overall doing well. No chest pain or shortness of breath. Has some upper body soreness. He has minimal lower leg edema on exam. Has some wounds on his feet as well. He is back to care-taking. He is not smoking, but this is really hard. He is starting cardiac rehab next week.   Past Medical History:  Diagnosis Date   CHF (congestive heart failure) (HCC)    Coronary artery disease    Diabetes mellitus without complication ([facility]    Hypertension     Past Surgical History:  Procedure Laterality Date   LOWER EXTREMITY ANGIOGRAPHY Left 02/14/2022   Procedure: Lower Extremity Angiography;  Surgeon: DAlgernon Huxley MD;  [facility];  Service: Cardiovascular;  Laterality: Left;   RIGHT/LEFT HEART CATH AND CORONARY ANGIOGRAPHY N/A 02/21/2022   Procedure: RIGHT/LEFT HEART CATH AND CORONARY ANGIOGRAPHY;   Surgeon: ENelva Bush MD;  [facility];  Service: Cardiovascular;  Laterality: N/A;    Current Medications: Current Meds  Medication Sig   Accu-Chek Softclix Lancets lancets Use as directed to check blood glucose daily   acetaminophen (TYLENOL) 500 MG tablet Take 2 tablets (1,000 mg total) by mouth every 6 (six) hours as needed for mild pain, moderate pain, fever or headache.   ALPRAZolam (XANAX) 1 MG tablet Take 0.5 tablets (0.5 mg total) by mouth 3 (three) times daily as needed for anxiety.   aspirin EC 81 MG tablet Take 1 tablet (81 mg total) by mouth daily. Swallow whole.   atorvastatin (LIPITOR) 80 MG tablet Take 1 tablet (80 mg total) by mouth daily.   Blood Glucose Monitoring Suppl (ACCU-CHEK GUIDE ME) w/Device KIT Use as directed daily   dapagliflozin propanediol (FARXIGA) 10 MG TABS tablet Take one tablet by mouth daily   digoxin (LANOXIN) 0.125 MG tablet Take 1 tablet (0.125 mg total) by mouth daily.   docusate sodium (STOOL SOFTENER) 250 MG capsule Take 250 mg by mouth daily.   glucose blood test strip Use as directed once daily   metFORMIN (GLUCOPHAGE) 500 MG tablet Take 1 tablet (500 mg total) by mouth 2 (two) times daily.   methocarbamol (ROBAXIN) 750 MG tablet Take 1 tablet (750 mg total) by mouth every 6 (six) hours as needed for muscle spasms.   metoprolol succinate (TOPROL-XL) 25 MG 24 hr tablet Take  0.5 tablets (12.5 mg total) by mouth daily.   pantoprazole (PROTONIX) 40 MG tablet Take 1 tablet (40 mg total) by mouth daily.   potassium chloride SA (KLOR-CON M) 20 MEQ tablet Take 1 tablet (20 mEq total) by mouth daily.   pregabalin (LYRICA) 100 MG capsule Take 1 capsule by mouth three times a day   QUEtiapine (SEROQUEL) 25 MG tablet Take 1 tablet (25 mg total) by mouth at bedtime.   spironolactone (ALDACTONE) 25 MG tablet Take 0.5 tablets (12.5 mg total) by mouth daily.   [DISCONTINUED] furosemide (LASIX) 40 MG tablet Take 1 tablet (40 mg total) by  mouth daily.     Allergies:   Patient has no known allergies.   Social History   Socioeconomic History   Marital status: Single    Spouse name: Not on file   Number of children: Not on file   Years of education: Not on file   Highest education level: Not on file  Occupational History   Not on file  Tobacco Use   Smoking status: Former    Packs/day: 1.00    Years: 23.00    Total pack years: 23.00    Types: Cigarettes    Quit date: 02/04/2022    Years since quitting: 0.3   Smokeless tobacco: Never  Vaping Use   Vaping Use: Not on file  Substance and Sexual Activity   Alcohol use: Not Currently   Drug use: Never   Sexual activity: Not Currently  Other Topics Concern   Not on file  Social History Narrative   Not on file   Social Determinants of Health   Financial Resource Strain: High Risk (03/09/2022)   Overall Financial Resource Strain (CARDIA)    Difficulty of Paying Living Expenses: Hard  Food Insecurity: Food Insecurity Present (03/09/2022)   Hunger Vital Sign    Worried About Running Out of Food in the Last Year: Sometimes true    Ran Out of Food in the Last Year: Sometimes true  Transportation Needs: No Transportation Needs (03/09/2022)   PRAPARE - Hydrologist (Medical): No    Lack of Transportation (Non-Medical): No  Physical Activity: Not on file  Stress: Not on file  Social Connections: Not on file     Family History: The patient's family history includes Congestive Heart Failure in his father.  ROS:   Please see the history of present illness.     All other systems reviewed and are negative.  EKGs/Labs/Other Studies Reviewed:    The following studies were reviewed today:    TTE (02/06/2022): 1. Left ventricular ejection fraction, by estimation, is 30 to 35%. The  left ventricle has moderately decreased function. The left ventricle  demonstrates global hypokinesis. Left ventricular diastolic function could  not be  evaluated.   2. RV not well visualized but likely enlarged.   3. Large pleural effusion.   4. The mitral valve is normal in structure. Mild mitral valve  regurgitation.   5. The aortic valve is normal in structure. Aortic valve regurgitation is  not visualized.     EKG:  EKG is ordered today.  The ekg ordered today demonstrates NSR 89bpm, LAD, nonspecific T wave changes  Recent Labs: 02/05/2022: TSH 9.011 02/11/2022: ALT 27 02/14/2022: Magnesium 2.4 02/23/2022: Hemoglobin 13.3; Platelets 368 04/11/2022: B Natriuretic Peptide 347.0; BUN 20; Creatinine, Ser 1.11; Potassium 4.5; Sodium 140  Recent Lipid Panel    Component Value Date/Time   CHOL 242 (H) 02/24/2022 1191  TRIG 235 (H) 02/24/2022 0614   HDL 35 (L) 02/24/2022 0614   CHOLHDL 6.9 02/24/2022 0614   VLDL 47 (H) 02/24/2022 0614   LDLCALC 160 (H) 02/24/2022 8006   Physical Exam:    VS:  BP 102/72   Pulse 92   Ht 6' (1.829 m)   Wt 252 lb (114.3 kg)   SpO2 97%   BMI 34.18 kg/m     Wt Readings from Last 3 Encounters:  05/28/22 252 lb (114.3 kg)  05/11/22 245 lb (111.1 kg)  05/03/22 247 lb 4.8 oz (112.2 kg)     GEN:  Well nourished, well developed in no acute distress HEENT: Normal NECK: No JVD; No carotid bruits LYMPHATICS: No lymphadenopathy CARDIAC: RRR, no murmurs, rubs, gallops RESPIRATORY:  Clear to auscultation without rales, wheezing or rhonchi  ABDOMEN: Soft, non-tender, non-distended MUSCULOSKELETAL:  minimal lower leg edema; No deformity  SKIN: Warm and dry NEUROLOGIC:  Alert and oriented x 3 PSYCHIATRIC:  Normal affect   ASSESSMENT:    1. Acute on chronic combined systolic and diastolic CHF (congestive heart failure) ([place])   2. Coronary artery disease of native artery of native heart with stable angina pectoris ([place])   3. Hyperlipidemia, mixed   4. Type 2 diabetes mellitus without complication, without long-term current use of insulin (HCC)    PLAN:    In order of problems listed  above:  HFrEF Systolic heart failure He has minimal lower leg edema on exam. I will increase lasix to 50m daily. BMET in 2 weeks. Continue Farxiga 175mdaily, Digoxin 0.12571maily, Toprol 12.5mg29mily, and spironolactone 12.5mg 18mly. Soft BP limiting GDMT. I will repeat an echocardiogram to assess pump function.   Severe 3V CAD LHC report above. No plan for intervention or CTTS consult. The patient denies anginal symptoms. Continue medical management with ASA, Toprol and Lipitor. He started cardiac rehab.   HLD LDL 160. Continue Lipitor. Can re-check lipids at follow-up.   DM2 A1C 7.7. He will continue to follow with his PCP.   Disposition: Follow up in 3 month(s) with MD/APP    Signed, Lashuna Tamashiro H FurNinfa MeekerC  05/28/2022 2:37 PM    [facility]

## 2022-05-28 NOTE — Telephone Encounter (Signed)
Received fax from [place] clinic states they do not do clearances

## 2022-05-28 NOTE — Telephone Encounter (Signed)
Patient seen today by Tarri Glenn, PA. We have received a request to clear patient for colonoscopy. With EF 16% and soft BP limiting GDMT, would like your input on patient's clearance.  Please send response to p cv div preop  Thank you, Emmaline Life, NP-C 05/28/2022, 3:09 PM [address], [address] Office [phone] Fax [phone]

## 2022-05-29 ENCOUNTER — Telehealth: Payer: Self-pay

## 2022-05-29 NOTE — Telephone Encounter (Signed)
   Primary Cardiologist: Kate Sable, MD  Chart reviewed as part of pre-operative protocol coverage. Given past medical history and time since last visit, based on ACC/AHA guidelines, Lawrence Murray would be at acceptable risk for the planned procedure without further cardiovascular testing.   I will route this recommendation to the requesting party via Epic fax function and remove from pre-op pool.  Please call with questions.  Emmaline Life, NP-C  05/29/2022, 5:59 AM [address], [address] Office [phone] Fax [phone]

## 2022-05-29 NOTE — Telephone Encounter (Signed)
Patient has been cleared by cardiologist Dr. Andres Labrum Ag[place].  Per Office note " Okay to proceed with colonoscopy from a cardiac standpoint. Continue current medications as prescribed."  Thanks,  Sharyn Lull, [place]

## 2022-05-30 ENCOUNTER — Encounter: Payer: Self-pay | Admitting: Cardiology

## 2022-05-30 ENCOUNTER — Other Ambulatory Visit: Payer: Self-pay

## 2022-05-30 VITALS — Ht 72.25 in | Wt 254.9 lb

## 2022-05-30 DIAGNOSIS — I5040 Unspecified combined systolic (congestive) and diastolic (congestive) heart failure: Secondary | ICD-10-CM | POA: Diagnosis not present

## 2022-05-30 DIAGNOSIS — I5042 Chronic combined systolic (congestive) and diastolic (congestive) heart failure: Secondary | ICD-10-CM

## 2022-05-30 NOTE — Progress Notes (Signed)
Cardiac Individual Treatment Plan  Patient Details  Name: Lawrence Murray MRN: [id number] Date of Birth: 10-01-1965 Referring Provider:   Flowsheet Row Cardiac Rehab from 05/30/2022 in [facility] Cardiac and Pulmonary Rehab  Referring Provider Ernestina Penna MD       Initial Encounter Date:  Flowsheet Row Cardiac Rehab from 05/30/2022 in [facility] Cardiac and Pulmonary Rehab  Date 05/30/22       Visit Diagnosis: Chronic combined systolic and diastolic heart failure ([place])  Patient's Home Medications on Admission:  Current Outpatient Medications:    Accu-Chek Softclix Lancets lancets, Use as directed to check blood glucose daily, Disp: 100 each, Rfl: 0   acetaminophen (TYLENOL) 500 MG tablet, Take 2 tablets (1,000 mg total) by mouth every 6 (six) hours as needed for mild pain, moderate pain, fever or headache., Disp: 30 tablet, Rfl: 0   ALPRAZolam (XANAX) 1 MG tablet, Take 0.5 tablets (0.5 mg total) by mouth 3 (three) times daily as needed for anxiety., Disp: 90 tablet, Rfl: 0   aspirin EC 81 MG tablet, Take 1 tablet (81 mg total) by mouth daily. Swallow whole., Disp: 30 tablet, Rfl: 12   atorvastatin (LIPITOR) 80 MG tablet, Take 1 tablet (80 mg total) by mouth daily., Disp: 90 tablet, Rfl: 0   Blood Glucose Monitoring Suppl (ACCU-CHEK GUIDE ME) w/Device KIT, Use as directed daily, Disp: 1 kit, Rfl: 0   dapagliflozin propanediol (FARXIGA) 10 MG TABS tablet, Take one tablet by mouth daily, Disp: 90 tablet, Rfl: 3   digoxin (LANOXIN) 0.125 MG tablet, Take 1 tablet (0.125 mg total) by mouth daily., Disp: 90 tablet, Rfl: 0   docusate sodium (STOOL SOFTENER) 250 MG capsule, Take 250 mg by mouth daily., Disp: , Rfl:    furosemide (LASIX) 40 MG tablet, Take 1.5 tablets (60 mg total) by mouth daily., Disp: 270 tablet, Rfl: 0   glucose blood test strip, Use as directed once daily, Disp: 100 each, Rfl: 0   metFORMIN (GLUCOPHAGE) 500 MG tablet, Take 1 tablet (500 mg total) by mouth 2 (two) times daily.,  Disp: 60 tablet, Rfl: 11   methocarbamol (ROBAXIN) 750 MG tablet, Take 1 tablet (750 mg total) by mouth every 6 (six) hours as needed for muscle spasms., Disp: 30 tablet, Rfl: 11   metoprolol succinate (TOPROL-XL) 25 MG 24 hr tablet, Take 0.5 tablets (12.5 mg total) by mouth daily., Disp: 30 tablet, Rfl: 0   pantoprazole (PROTONIX) 40 MG tablet, Take 1 tablet (40 mg total) by mouth daily., Disp: 30 tablet, Rfl: 11   potassium chloride SA (KLOR-CON M) 20 MEQ tablet, Take 1 tablet (20 mEq total) by mouth daily., Disp: 30 tablet, Rfl: 11   pregabalin (LYRICA) 100 MG capsule, Take 1 capsule by mouth three times a day, Disp: 90 capsule, Rfl: 0   QUEtiapine (SEROQUEL) 25 MG tablet, Take 1 tablet (25 mg total) by mouth at bedtime., Disp: 30 tablet, Rfl: 11   spironolactone (ALDACTONE) 25 MG tablet, Take 0.5 tablets (12.5 mg total) by mouth daily., Disp: 30 tablet, Rfl: 11  Past Medical History: Past Medical History:  Diagnosis Date   CHF (congestive heart failure) (HCC)    Coronary artery disease    Diabetes mellitus without complication ([place])    Hypertension     Tobacco Use: Social History   Tobacco Use  Smoking Status Former   Packs/day: 1.00   Years: 23.00   Total pack years: 23.00   Types: Cigarettes   Quit date: 02/04/2022   Years since quitting: 0.3  Smokeless Tobacco Never    Labs: Review Flowsheet       Latest Ref Rng & Units 02/05/2022 02/09/2022 02/24/2022  Labs for ITP Cardiac and Pulmonary Rehab  Cholestrol 0 - 200 mg/dL - - 242   LDL (calc) 0 - 99 mg/dL - - 160   HDL-C >40 mg/dL - - 35   Trlycerides <150 mg/dL - - 235   Hemoglobin A1c 4.8 - 5.6 % 7.7  - -  PH, Arterial 7.35 - 7.45 - 7.52  -  PCO2 arterial 32 - 48 mmHg - 47  -  Bicarbonate 20.0 - 28.0 mmol/L - 38.4  -  O2 Saturation % - 97.6  -     Exercise Target Goals: Exercise Program Goal: Individual exercise prescription set using results from initial 6 min walk test and THRR while considering  patient's  activity barriers and safety.   Exercise Prescription Goal: Initial exercise prescription builds to 30-45 minutes a day of aerobic activity, 2-3 days per week.  Home exercise guidelines will be given to patient during program as part of exercise prescription that the participant will acknowledge.   Education: Aerobic Exercise: - Group verbal and visual presentation on the components of exercise prescription. Introduces F.I.T.T principle from ACSM for exercise prescriptions.  Reviews F.I.T.T. principles of aerobic exercise including progression. Written material given at graduation.   Education: Resistance Exercise: - Group verbal and visual presentation on the components of exercise prescription. Introduces F.I.T.T principle from ACSM for exercise prescriptions  Reviews F.I.T.T. principles of resistance exercise including progression. Written material given at graduation.    Education: Exercise & Equipment Safety: - Individual verbal instruction and demonstration of equipment use and safety with use of the equipment. Flowsheet Row Cardiac Rehab from 05/30/2022 in [facility] Cardiac and Pulmonary Rehab  Date 05/21/22  Educator [facility]  Instruction Review Code 1- Verbalizes Understanding       Education: Exercise Physiology & General Exercise Guidelines: - Group verbal and written instruction with models to review the exercise physiology of the cardiovascular system and associated critical values. Provides general exercise guidelines with specific guidelines to those with heart or lung disease.    Education: Flexibility, Balance, Mind/Body Relaxation: - Group verbal and visual presentation with interactive activity on the components of exercise prescription. Introduces F.I.T.T principle from ACSM for exercise prescriptions. Reviews F.I.T.T. principles of flexibility and balance exercise training including progression. Also discusses the mind body connection.  Reviews various relaxation techniques to  help reduce and manage stress (i.e. Deep breathing, progressive muscle relaxation, and visualization). Balance handout provided to take home. Written material given at graduation.   Activity Barriers & Risk Stratification:  Activity Barriers & Cardiac Risk Stratification - 05/30/22 1531       Activity Barriers & Cardiac Risk Stratification   Activity Barriers Joint Problems;Deconditioning;Muscular Weakness;Shortness of Breath;Other (comment)    Comments Neuropathy, Edema, PAD    Cardiac Risk Stratification High             6 Minute Walk:  6 Minute Walk     Row Name 05/30/22 1529         6 Minute Walk   Phase Initial     Distance 1075 feet     Walk Time 6 minutes     # of Rest Breaks 0     MPH 2.04     METS 2.86     RPE 13     Perceived Dyspnea  3     VO2 Peak 10  Symptoms Yes (comment)     Comments SOB, hip, knee, foot pain/numbness     Resting HR 70 bpm     Resting BP 100/52     Resting Oxygen Saturation  99 %     Exercise Oxygen Saturation  during 6 min walk 100 %     Max Ex. HR 98 bpm     Max Ex. BP 114/64     2 Minute Post BP 106/58              Oxygen Initial Assessment:   Oxygen Re-Evaluation:   Oxygen Discharge (Final Oxygen Re-Evaluation):   Initial Exercise Prescription:  Initial Exercise Prescription - 05/30/22 1500       Date of Initial Exercise RX and Referring Provider   Date 05/30/22    Referring Provider Ernestina Penna MD      Oxygen   Maintain Oxygen Saturation 88% or higher      Treadmill   MPH 2    Grade 0    Minutes 15    METs 2.53      REL-XR   Level 1    Speed 50    Minutes 15    METs 2.86      T5 Nustep   Level 1    SPM 80    Minutes 15    METs 2.86      Prescription Details   Frequency (times per week) 3    Duration Progress to 30 minutes of continuous aerobic without signs/symptoms of physical distress      Intensity   THRR 40-80% of Max Heartrate 108-146    Ratings of Perceived Exertion  11-13    Perceived Dyspnea 0-4      Progression   Progression Continue to progress workloads to maintain intensity without signs/symptoms of physical distress.      Resistance Training   Training Prescription Yes    Weight 4 lb    Reps 10-15             Perform Capillary Blood Glucose checks as needed.  Exercise Prescription Changes:   Exercise Prescription Changes     Row Name 05/30/22 1500             Response to Exercise   Blood Pressure (Admit) 100/52       Blood Pressure (Exercise) 114/64       Blood Pressure (Exit) 106/58       Heart Rate (Admit) 70 bpm       Heart Rate (Exercise) 98 bpm       Heart Rate (Exit) 69 bpm       Oxygen Saturation (Admit) 99 %       Oxygen Saturation (Exercise) 100 %       Rating of Perceived Exertion (Exercise) 13       Perceived Dyspnea (Exercise) 3       Symptoms SOB, Hip, knee, feet pain/tightness       Comments 6MWT Results                Exercise Comments:   Exercise Goals and Review:   Exercise Goals     Row Name 05/30/22 1546             Exercise Goals   Increase Physical Activity Yes       Intervention Develop an individualized exercise prescription for aerobic and resistive training based on initial evaluation findings, risk stratification, comorbidities and participant's personal goals.;Provide advice, education, support and counseling  about physical activity/exercise needs.       Expected Outcomes Short Term: Attend rehab on a regular basis to increase amount of physical activity.;Long Term: Add in home exercise to make exercise part of routine and to increase amount of physical activity.;Long Term: Exercising regularly at least 3-5 days a week.       Increase Strength and Stamina Yes       Intervention Provide advice, education, support and counseling about physical activity/exercise needs.;Develop an individualized exercise prescription for aerobic and resistive training based on initial evaluation  findings, risk stratification, comorbidities and participant's personal goals.       Expected Outcomes Short Term: Increase workloads from initial exercise prescription for resistance, speed, and METs.;Short Term: Perform resistance training exercises routinely during rehab and add in resistance training at home;Long Term: Improve cardiorespiratory fitness, muscular endurance and strength as measured by increased METs and functional capacity (6MWT)       Able to understand and use rate of perceived exertion (RPE) scale Yes       Intervention Provide education and explanation on how to use RPE scale       Expected Outcomes Short Term: Able to use RPE daily in rehab to express subjective intensity level;Long Term:  Able to use RPE to guide intensity level when exercising independently       Able to understand and use Dyspnea scale Yes       Intervention Provide education and explanation on how to use Dyspnea scale       Expected Outcomes Long Term: Able to use Dyspnea scale to guide intensity level when exercising independently;Short Term: Able to use Dyspnea scale daily in rehab to express subjective sense of shortness of breath during exertion       Knowledge and understanding of Target Heart Rate Range (THRR) Yes       Intervention Provide education and explanation of THRR including how the numbers were predicted and where they are located for reference       Expected Outcomes Long Term: Able to use THRR to govern intensity when exercising independently;Short Term: Able to state/look up THRR;Short Term: Able to use daily as guideline for intensity in rehab       Able to check pulse independently Yes       Intervention Review the importance of being able to check your own pulse for safety during independent exercise;Provide education and demonstration on how to check pulse in carotid and radial arteries.       Expected Outcomes Short Term: Able to explain why pulse checking is important during  independent exercise;Long Term: Able to check pulse independently and accurately       Understanding of Exercise Prescription Yes       Intervention Provide education, explanation, and written materials on patient's individual exercise prescription       Expected Outcomes Short Term: Able to explain program exercise prescription;Long Term: Able to explain home exercise prescription to exercise independently                Exercise Goals Re-Evaluation :   Discharge Exercise Prescription (Final Exercise Prescription Changes):  Exercise Prescription Changes - 05/30/22 1500       Response to Exercise   Blood Pressure (Admit) 100/52    Blood Pressure (Exercise) 114/64    Blood Pressure (Exit) 106/58    Heart Rate (Admit) 70 bpm    Heart Rate (Exercise) 98 bpm    Heart Rate (Exit) 69 bpm  Oxygen Saturation (Admit) 99 %    Oxygen Saturation (Exercise) 100 %    Rating of Perceived Exertion (Exercise) 13    Perceived Dyspnea (Exercise) 3    Symptoms SOB, Hip, knee, feet pain/tightness    Comments 6MWT Results             Nutrition:  Target Goals: Understanding of nutrition guidelines, daily intake of sodium <1546m, cholesterol <2029m calories 30% from fat and 7% or less from saturated fats, daily to have 5 or more servings of fruits and vegetables.  Education: All About Nutrition: -Group instruction provided by verbal, written material, interactive activities, discussions, models, and posters to present general guidelines for heart healthy nutrition including fat, fiber, MyPlate, the role of sodium in heart healthy nutrition, utilization of the nutrition label, and utilization of this knowledge for meal planning. Follow up email sent as well. Written material given at graduation. Flowsheet [facility]  Education need identified 05/30/22       Biometrics:  Pre Biometrics - 05/30/22 1547       Pre Biometrics    Height 6' 0.25" (1.835 m)    Weight 254 lb 14.4 oz (115.6 kg)    Waist Circumference 59.5 inches    Hip Circumference 43 inches    Waist to Hip Ratio 1.38 %    BMI (Calculated) 34.34    Single Leg Stand 26.2 seconds   Right             Nutrition Therapy Plan and Nutrition Goals:   Nutrition Assessments:  MEDIFICTS Score Key: ?70 Need to make dietary changes  40-70 Heart Healthy Diet ? 40 Therapeutic Level Cholesterol Diet  [facility]  Picture Your Plate Total Score on Admission 55      Picture Your Plate Scores: <4<95nhealthy dietary pattern with much room for improvement. 41-50 Dietary pattern unlikely to meet recommendations for good health and room for improvement. 51-60 More healthful dietary pattern, with some room for improvement.  >60 Healthy dietary pattern, although there may be some specific behaviors that could be improved.    Nutrition Goals Re-Evaluation:   Nutrition Goals Discharge (Final Nutrition Goals Re-Evaluation):   Psychosocial: Target Goals: Acknowledge presence or absence of significant depression and/or stress, maximize coping skills, provide positive support system. Participant is able to verbalize types and ability to use techniques and skills needed for reducing stress and depression.   Education: Stress, Anxiety, and Depression - Group verbal and visual presentation to define topics covered.  Reviews how body is impacted by stress, anxiety, and depression.  Also discusses healthy ways to reduce stress and to treat/manage anxiety and depression.  Written material given at graduation.   Education: Sleep Hygiene -Provides group verbal and written instruction about how sleep can affect your health.  Define sleep hygiene, discuss sleep cycles and impact of sleep habits. Review good sleep hygiene tips.    Initial Review & Psychosocial Screening:  Initial Psych Review &  Screening - 05/21/22 1119       Initial Review   Current issues with Current Sleep Concerns;Current Psychotropic Meds;History of Depression;Current Depression;Current Anxiety/Panic;Current Stress Concerns    Source of Stress Concerns Chronic Illness;Family    Comments [place] taking care of his dad routnely and lost track of his own health. He was in and out of court for custody and child support in the past. He has not  seen a doctor since the mids 90s and was the last time he had insurance. His dad and son live with him. They are all living off of his fathers social securtiy. He states taking care of his father has drained him financially, phyically and mentaly.      Family Dynamics   Good Support System? No    Strains Intra-family strains;Illness and family care strain      Barriers   Psychosocial barriers to participate in program The patient should benefit from training in stress management and relaxation.      Screening Interventions   Interventions Encouraged to exercise;To provide support and resources with identified psychosocial needs;Provide feedback about the scores to participant    Expected Outcomes Short Term goal: Utilizing psychosocial counselor, staff and physician to assist with identification of specific Stressors or current issues interfering with healing process. Setting desired goal for each stressor or current issue identified.;Long Term Goal: Stressors or current issues are controlled or eliminated.;Short Term goal: Identification and review with participant of any Quality of Life or Depression concerns found by scoring the questionnaire.;Long Term goal: The participant improves quality of Life and PHQ9 Scores as seen by post scores and/or verbalization of changes             Quality of Life Scores:   Quality of Life - 05/30/22 1549       Quality of Life   Select Quality of Life      Quality of Life Scores   Health/Function Pre 11.1 %    Socioeconomic Pre  15.5 %    Psych/Spiritual Pre 7.29 %    Family Pre 10.2 %    GLOBAL Pre 11.21 %            Scores of 19 and below usually indicate a poorer quality of life in these areas.  A difference of  2-3 points is a clinically meaningful difference.  A difference of 2-3 points in the total score of the Quality of Life Index has been associated with significant improvement in overall quality of life, self-image, physical symptoms, and general health in studies assessing change in quality of life.  PHQ-9: Review Flowsheet       05/30/2022 05/11/2022 05/03/2022  Depression screen PHQ 2/9  Decreased Interest 2 0 0  Down, Depressed, Hopeless 2 3 0  PHQ - 2 Score 4 3 0  Altered sleeping 2 3 -  Tired, decreased energy 2 3 -  Change in appetite 2 3 -  Feeling bad or failure about yourself  3 3 -  Trouble concentrating 1 3 -  Moving slowly or fidgety/restless 2 1 -  Suicidal thoughts 0 0 -  PHQ-9 Score 16 19 -  Difficult doing work/chores Very difficult Extremely dIfficult -   Interpretation of Total Score  Total Score Depression Severity:  1-4 = Minimal depression, 5-9 = Mild depression, 10-14 = Moderate depression, 15-19 = Moderately severe depression, 20-27 = Severe depression   Psychosocial Evaluation and Intervention:  Psychosocial Evaluation - 05/21/22 1124       Psychosocial Evaluation & Interventions   Interventions Encouraged to exercise with the program and follow exercise prescription;Stress management education;Relaxation education    Comments Shalik is taking care of his dad routnely and lost track of his own health. He was in and out of court for custody and child support in the past. He has not seen a doctor since the [place] and was the last time he had insurance. His  dad and son live with him. They are all living off of his fathers social securtiy. He states taking care of his father has drained him financially, phyically and mentaly.    Expected Outcomes Short: Start [facility]  to help with mood. Long: Maintain a healthy mental state    Continue Psychosocial Services  Follow up required by staff             Psychosocial Re-Evaluation:   Psychosocial Discharge (Final Psychosocial Re-Evaluation):   Vocational Rehabilitation: Provide vocational rehab assistance to qualifying candidates.   Vocational Rehab Evaluation & Intervention:   Education: Education Goals: Education classes will be provided on a variety of topics geared toward better understanding of heart health and risk factor modification. Participant will state understanding/return demonstration of topics presented as noted by education test scores.  Learning Barriers/Preferences:  Learning Barriers/Preferences - 05/21/22 1115       Learning Barriers/Preferences   Learning Barriers None    Learning Preferences None             General Cardiac Education Topics:  AED/CPR: - Group verbal and written instruction with the use of models to demonstrate the basic use of the AED with the basic ABC's of resuscitation.   Anatomy and Cardiac Procedures: - Group verbal and visual presentation and models provide information about basic cardiac anatomy and function. Reviews the testing methods done to diagnose heart disease and the outcomes of the test results. Describes the treatment choices: Medical Management, Angioplasty, or Coronary Bypass Surgery for treating various heart conditions including Myocardial Infarction, Angina, Valve Disease, and Cardiac Arrhythmias.  Written material given at graduation. Flowsheet Row Cardiac Rehab [facility]30[facility]20[facility] Cardiac and Pulmonary Rehab  Education need identified 05/30/22       Medication Safety: - Group verbal and visual instruction to review commonly prescribed medications for heart and lung disease. Reviews the medication, class of the drug, and side effects. Includes the steps to properly store meds and maintain the prescription regimen.   Written material given at graduation.   Intimacy: - Group verbal instruction through game format to discuss how heart and lung disease can affect sexual intimacy. Written material given at graduation..   Know Your Numbers and Heart Failure: - Group verbal and visual instruction to discuss disease risk factors for cardiac and pulmonary disease and treatment options.  Reviews associated critical values for Overweight/Obesity, Hypertension, Cholesterol, and Diabetes.  Discusses basics of heart failure: signs/symptoms and treatments.  Introduces Heart Failure Zone chart for action plan for heart failure.  Written material given at graduation.   Infection Prevention: - Provides verbal and written material to individual with discussion of infection control including proper hand washing and proper equipment cleaning during exercise session. Flowsheet Row Cardiac Rehab from 05/30/2022 in [facility] Cardiac and Pulmonary Rehab  Date 05/21/22  Educator [facility]  Instruction Review Code 1- Verbalizes Understanding       Falls Prevention: - Provides verbal and written material to individual with discussion of falls prevention and safety. Flowsheet Row Cardiac Rehab from 05/30/2022 in [facility] Cardiac and Pulmonary Rehab  Date 05/21/22  Educator [facility]  Instruction Review Code 1- Verbalizes Understanding       Other: -Provides group and verbal instruction on various topics (see comments)   Knowledge Questionnaire Score:  Knowledge Questionnaire Score - 05/30/22 1558       Knowledge Questionnaire Score   Pre Score 23/26             Core Components/Risk Factors/Patient  Goals at Admission:  Personal Goals and Risk Factors at Admission - 05/30/22 1558       Core Components/Risk Factors/Patient Goals on Admission    Weight Management Yes;Weight Loss    Intervention Weight Management: Develop a combined nutrition and exercise program designed to reach desired caloric intake, while maintaining  appropriate intake of nutrient and fiber, sodium and fats, and appropriate energy expenditure required for the weight goal.;Weight Management: Provide education and appropriate resources to help participant work on and attain dietary goals.;Weight Management/Obesity: Establish reasonable short term and long term weight goals.    Admit Weight 254 lb 14.4 oz (115.6 kg)    Goal Weight: Short Term 250 lb (113.4 kg)    Goal Weight: Long Term 240 lb (108.9 kg)    Expected Outcomes Short Term: Continue to assess and modify interventions until short term weight is achieved;Long Term: Adherence to nutrition and physical activity/exercise program aimed toward attainment of established weight goal;Weight Loss: Understanding of general recommendations for a balanced deficit meal plan, which promotes 1-2 lb weight loss per week and includes a negative energy balance of [phone] kcal/d;Understanding recommendations for meals to include 15-35% energy as protein, 25-35% energy from fat, 35-60% energy from carbohydrates, less than 269m of dietary cholesterol, 20-35 gm of total fiber daily;Understanding of distribution of calorie intake throughout the day with the consumption of 4-5 meals/snacks    Tobacco Cessation Yes    Number of packs per day 0    Intervention Assist the participant in steps to quit. Provide individualized education and counseling about committing to Tobacco Cessation, relapse prevention, and pharmacological support that can be provided by physician.;OAdvice worker assist with locating and accessing local/national Quit Smoking programs, and support quit date choice.    Expected Outcomes Short Term: Will demonstrate readiness to quit, by selecting a quit date.;Short Term: Will quit all tobacco product use, adhering to prevention of relapse plan.;Long Term: Complete abstinence from all tobacco products for at least 12 months from quit date.    Diabetes Yes    Intervention Provide  education about signs/symptoms and action to take for hypo/hyperglycemia.;Provide education about proper nutrition, including hydration, and aerobic/resistive exercise prescription along with prescribed medications to achieve blood glucose in normal ranges: Fasting glucose 65-99 mg/dL    Expected Outcomes Short Term: Participant verbalizes understanding of the signs/symptoms and immediate care of hyper/hypoglycemia, proper foot care and importance of medication, aerobic/resistive exercise and nutrition plan for blood glucose control.;Long Term: Attainment of HbA1C < 7%.    Heart Failure Yes    Intervention Provide a combined exercise and nutrition program that is supplemented with education, support and counseling about heart failure. Directed toward relieving symptoms such as shortness of breath, decreased exercise tolerance, and extremity edema.    Expected Outcomes Improve functional capacity of life;Short term: Attendance in program 2-3 days a week with increased exercise capacity. Reported lower sodium intake. Reported increased fruit and vegetable intake. Reports medication compliance.;Short term: Daily weights obtained and reported for increase. Utilizing diuretic protocols set by physician.;Long term: Adoption of self-care skills and reduction of barriers for early signs and symptoms recognition and intervention leading to self-care maintenance.    Hypertension Yes    Intervention Provide education on lifestyle modifcations including regular physical activity/exercise, weight management, moderate sodium restriction and increased consumption of fresh fruit, vegetables, and low fat dairy, alcohol moderation, and smoking cessation.;Monitor prescription use compliance.    Expected Outcomes Short Term: Continued assessment and intervention until BP is < 140/93m  HG in hypertensive participants. < 130/77m HG in hypertensive participants with diabetes, heart failure or chronic kidney disease.;Long Term:  Maintenance of blood pressure at goal levels.    Lipids Yes    Intervention Provide education and support for participant on nutrition & aerobic/resistive exercise along with prescribed medications to achieve LDL <764m HDL >4058m   Expected Outcomes Long Term: Cholesterol controlled with medications as prescribed, with individualized exercise RX and with personalized nutrition plan. Value goals: LDL < 56m35mDL > 40 mg.;Short Term: Participant states understanding of desired cholesterol values and is compliant with medications prescribed. Participant is following exercise prescription and nutrition guidelines.             Education:Diabetes - Individual verbal and written instruction to review signs/symptoms of diabetes, desired ranges of glucose level fasting, after meals and with exercise. Acknowledge that pre and post exercise glucose checks will be done for 3 sessions at entry of program. [facility]  Date 05/21/22  Educator J[facility]  [facility] Code 1- Verbalizes Understanding       Core Components/Risk Factors/Patient Goals Review:    Core Components/Risk Factors/Patient Goals at Discharge (Final Review):    ITP Comments:  ITP Comments     Row Name 05/21/22 1113 05/30/22 1529         ITP Comments Virtual Visit completed. Patient informed on EP and RD appointment and 6 Minute walk test. Patient also informed of patient health questionnaires on My Chart. Patient Verbalizes understanding. Visit diagnosis can be found in [facility]. Completed 6MWT and gym orientation. Initial ITP created and sent for review to Dr. MarkEmily Filbertedical Director.               Comments: Initial ITP

## 2022-05-30 NOTE — Patient Instructions (Signed)
Patient Instructions  Patient Details  Name: Lawrence Murray MRN: [id number] Date of Birth: 03/01/1966 Referring Provider:  Kate Sable, MD  Below are your personal goals for exercise, nutrition, and risk factors. Our goal is to help you stay on track towards obtaining and maintaining these goals. We will be discussing your progress on these goals with you throughout the program.  Initial Exercise Prescription:  Initial Exercise Prescription - 05/30/22 1500       Date of Initial Exercise RX and Referring Provider   Date 05/30/22    Referring Provider Ernestina Penna MD      Oxygen   Maintain Oxygen Saturation 88% or higher      Treadmill   MPH 2    Grade 0    Minutes 15    METs 2.53      REL-XR   Level 1    Speed 50    Minutes 15    METs 2.86      T5 Nustep   Level 1    SPM 80    Minutes 15    METs 2.86      Prescription Details   Frequency (times per week) 3    Duration Progress to 30 minutes of continuous aerobic without signs/symptoms of physical distress      Intensity   THRR 40-80% of Max Heartrate 108-146    Ratings of Perceived Exertion 11-13    Perceived Dyspnea 0-4      Progression   Progression Continue to progress workloads to maintain intensity without signs/symptoms of physical distress.      Resistance Training   Training Prescription Yes    Weight 4 lb    Reps 10-15             Exercise Goals: Frequency: Be able to perform aerobic exercise two to three times per week in program working toward 2-5 days per week of home exercise.  Intensity: Work with a perceived exertion of 11 (fairly light) - 15 (hard) while following your exercise prescription.  We will make changes to your prescription with you as you progress through the program.   Duration: Be able to do 30 to 45 minutes of continuous aerobic exercise in addition to a 5 minute warm-up and a 5 minute cool-down routine.   Nutrition Goals: Your personal nutrition goals will  be established when you do your nutrition analysis with the dietician.  The following are general nutrition guidelines to follow: Cholesterol < '200mg'$ /day Sodium < '1500mg'$ /day Fiber: Men over 50 yrs - 30 grams per day  Personal Goals:  Personal Goals and Risk Factors at Admission - 05/30/22 1558       Core Components/Risk Factors/Patient Goals on Admission    Weight Management Yes;Weight Loss    Intervention Weight Management: Develop a combined nutrition and exercise program designed to reach desired caloric intake, while maintaining appropriate intake of nutrient and fiber, sodium and fats, and appropriate energy expenditure required for the weight goal.;Weight Management: Provide education and appropriate resources to help participant work on and attain dietary goals.;Weight Management/Obesity: Establish reasonable short term and long term weight goals.    Admit Weight 254 lb 14.4 oz (115.6 kg)    Goal Weight: Short Term 250 lb (113.4 kg)    Goal Weight: Long Term 240 lb (108.9 kg)    Expected Outcomes Short Term: Continue to assess and modify interventions until short term weight is achieved;Long Term: Adherence to nutrition and physical activity/exercise program aimed toward attainment of established  weight goal;Weight Loss: Understanding of general recommendations for a balanced deficit meal plan, which promotes 1-2 lb weight loss per week and includes a negative energy balance of [phone] kcal/d;Understanding recommendations for meals to include 15-35% energy as protein, 25-35% energy from fat, 35-60% energy from carbohydrates, less than '200mg'$  of dietary cholesterol, 20-35 gm of total fiber daily;Understanding of distribution of calorie intake throughout the day with the consumption of 4-5 meals/snacks    Tobacco Cessation Yes    Number of packs per day 0    Intervention Assist the participant in steps to quit. Provide individualized education and counseling about committing to Tobacco  Cessation, relapse prevention, and pharmacological support that can be provided by physician.;Advice worker, assist with locating and accessing local/national Quit Smoking programs, and support quit date choice.    Expected Outcomes Short Term: Will demonstrate readiness to quit, by selecting a quit date.;Short Term: Will quit all tobacco product use, adhering to prevention of relapse plan.;Long Term: Complete abstinence from all tobacco products for at least 12 months from quit date.    Diabetes Yes    Intervention Provide education about signs/symptoms and action to take for hypo/hyperglycemia.;Provide education about proper nutrition, including hydration, and aerobic/resistive exercise prescription along with prescribed medications to achieve blood glucose in normal ranges: Fasting glucose 65-99 mg/dL    Expected Outcomes Short Term: Participant verbalizes understanding of the signs/symptoms and immediate care of hyper/hypoglycemia, proper foot care and importance of medication, aerobic/resistive exercise and nutrition plan for blood glucose control.;Long Term: Attainment of HbA1C < 7%.    Heart Failure Yes    Intervention Provide a combined exercise and nutrition program that is supplemented with education, support and counseling about heart failure. Directed toward relieving symptoms such as shortness of breath, decreased exercise tolerance, and extremity edema.    Expected Outcomes Improve functional capacity of life;Short term: Attendance in program 2-3 days a week with increased exercise capacity. Reported lower sodium intake. Reported increased fruit and vegetable intake. Reports medication compliance.;Short term: Daily weights obtained and reported for increase. Utilizing diuretic protocols set by physician.;Long term: Adoption of self-care skills and reduction of barriers for early signs and symptoms recognition and intervention leading to self-care maintenance.    Hypertension Yes     Intervention Provide education on lifestyle modifcations including regular physical activity/exercise, weight management, moderate sodium restriction and increased consumption of fresh fruit, vegetables, and low fat dairy, alcohol moderation, and smoking cessation.;Monitor prescription use compliance.    Expected Outcomes Short Term: Continued assessment and intervention until BP is < 140/26m HG in hypertensive participants. < 130/832mHG in hypertensive participants with diabetes, heart failure or chronic kidney disease.;Long Term: Maintenance of blood pressure at goal levels.    Lipids Yes    Intervention Provide education and support for participant on nutrition & aerobic/resistive exercise along with prescribed medications to achieve LDL '70mg'$ , HDL >'40mg'$ .    Expected Outcomes Long Term: Cholesterol controlled with medications as prescribed, with individualized exercise RX and with personalized nutrition plan. Value goals: LDL < '70mg'$ , HDL > 40 mg.;Short Term: Participant states understanding of desired cholesterol values and is compliant with medications prescribed. Participant is following exercise prescription and nutrition guidelines.             Tobacco Use Initial Evaluation: Social History   Tobacco Use  Smoking Status Former   Packs/day: 1.00   Years: 23.00   Total pack years: 23.00   Types: Cigarettes   Quit date: 02/04/2022   Years since  quitting: 0.3  Smokeless Tobacco Never    Exercise Goals and Review:  Exercise Goals     Row Name 05/30/22 1546             Exercise Goals   Increase Physical Activity Yes       Intervention Develop an individualized exercise prescription for aerobic and resistive training based on initial evaluation findings, risk stratification, comorbidities and participant's personal goals.;Provide advice, education, support and counseling about physical activity/exercise needs.       Expected Outcomes Short Term: Attend rehab on a regular  basis to increase amount of physical activity.;Long Term: Add in home exercise to make exercise part of routine and to increase amount of physical activity.;Long Term: Exercising regularly at least 3-5 days a week.       Increase Strength and Stamina Yes       Intervention Provide advice, education, support and counseling about physical activity/exercise needs.;Develop an individualized exercise prescription for aerobic and resistive training based on initial evaluation findings, risk stratification, comorbidities and participant's personal goals.       Expected Outcomes Short Term: Increase workloads from initial exercise prescription for resistance, speed, and METs.;Short Term: Perform resistance training exercises routinely during rehab and add in resistance training at home;Long Term: Improve cardiorespiratory fitness, muscular endurance and strength as measured by increased METs and functional capacity (6MWT)       Able to understand and use rate of perceived exertion (RPE) scale Yes       Intervention Provide education and explanation on how to use RPE scale       Expected Outcomes Short Term: Able to use RPE daily in rehab to express subjective intensity level;Long Term:  Able to use RPE to guide intensity level when exercising independently       Able to understand and use Dyspnea scale Yes       Intervention Provide education and explanation on how to use Dyspnea scale       Expected Outcomes Long Term: Able to use Dyspnea scale to guide intensity level when exercising independently;Short Term: Able to use Dyspnea scale daily in rehab to express subjective sense of shortness of breath during exertion       Knowledge and understanding of Target Heart Rate Range (THRR) Yes       Intervention Provide education and explanation of THRR including how the numbers were predicted and where they are located for reference       Expected Outcomes Long Term: Able to use THRR to govern intensity when  exercising independently;Short Term: Able to state/look up THRR;Short Term: Able to use daily as guideline for intensity in rehab       Able to check pulse independently Yes       Intervention Review the importance of being able to check your own pulse for safety during independent exercise;Provide education and demonstration on how to check pulse in carotid and radial arteries.       Expected Outcomes Short Term: Able to explain why pulse checking is important during independent exercise;Long Term: Able to check pulse independently and accurately       Understanding of Exercise Prescription Yes       Intervention Provide education, explanation, and written materials on patient's individual exercise prescription       Expected Outcomes Short Term: Able to explain program exercise prescription;Long Term: Able to explain home exercise prescription to exercise independently

## 2022-05-31 ENCOUNTER — Encounter: Payer: Self-pay | Admitting: Nurse Practitioner

## 2022-05-31 ENCOUNTER — Other Ambulatory Visit: Payer: Self-pay

## 2022-05-31 ENCOUNTER — Encounter: Payer: Medicaid Other | Admitting: *Deleted

## 2022-05-31 DIAGNOSIS — I5040 Unspecified combined systolic (congestive) and diastolic (congestive) heart failure: Secondary | ICD-10-CM | POA: Diagnosis not present

## 2022-05-31 DIAGNOSIS — I5042 Chronic combined systolic (congestive) and diastolic (congestive) heart failure: Secondary | ICD-10-CM

## 2022-05-31 LAB — GLUCOSE, CAPILLARY
Glucose-Capillary: 170 mg/dL — ABNORMAL HIGH (ref 70–99)
Glucose-Capillary: 153 mg/dL — ABNORMAL HIGH (ref 70–99)

## 2022-05-31 NOTE — Progress Notes (Signed)
Daily Session Note  Patient Details  Name: Lawrence Murray MRN: [id number] Date of Birth: April 13, 1966 Referring Provider:   Flowsheet Row Cardiac Rehab from 05/30/2022 in [facility] Cardiac and Pulmonary Rehab  Referring Provider Ernestina Penna MD       Encounter Date: 05/31/2022  Check In:  Session Check In - 05/31/22 1531       Check-In   Supervising physician immediately available to respond to emergencies See telemetry face sheet for immediately available ER MD    Location [facility]-Cardiac & Pulmonary Rehab    Staff Present Alberteen Sam, MA, RCEP, CCRP, CCET;Joseph [place], [place], RN, [place]    Virtual Visit No    Medication changes reported     No    Fall or balance concerns reported    No    Warm-up and Cool-down Performed on first and last piece of equipment    Resistance Training Performed Yes    VAD Patient? No    PAD/SET Patient? No      Pain Assessment   Currently in Pain? No/denies                Social History   Tobacco Use  Smoking Status Former   Packs/day: 1.00   Years: 23.00   Total pack years: 23.00   Types: Cigarettes   Quit date: 02/04/2022   Years since quitting: 0.3  Smokeless Tobacco Never    Goals Met:  Independence with exercise equipment Exercise tolerated well No report of concerns or symptoms today Strength training completed today  Goals Unmet:  Not Applicable  Comments: First full day of exercise!  Patient was oriented to gym and equipment including functions, settings, policies, and procedures.  Patient's individual exercise prescription and treatment plan were reviewed.  All starting workloads were established based on the results of the 6 minute walk test done at initial orientation visit.  The plan for exercise progression was also introduced and progression will be customized based on patient's performance and goals.    Dr. Emily Filbert is Medical Director for [facility].  Dr. Ottie Glazier is Medical Director for [facility] Pulmonary Rehabilitation.

## 2022-06-01 ENCOUNTER — Ambulatory Visit (INDEPENDENT_AMBULATORY_CARE_PROVIDER_SITE_OTHER): Payer: Medicaid Other | Admitting: Nurse Practitioner

## 2022-06-01 ENCOUNTER — Other Ambulatory Visit: Payer: Self-pay

## 2022-06-01 ENCOUNTER — Encounter: Payer: Self-pay | Admitting: Nurse Practitioner

## 2022-06-01 VITALS — BP 111/75 | HR 82 | Ht 72.3 in | Wt 255.3 lb

## 2022-06-01 DIAGNOSIS — S50812A Abrasion of left forearm, initial encounter: Secondary | ICD-10-CM | POA: Diagnosis not present

## 2022-06-01 DIAGNOSIS — Z23 Encounter for immunization: Secondary | ICD-10-CM | POA: Diagnosis not present

## 2022-06-01 DIAGNOSIS — R600 Localized edema: Secondary | ICD-10-CM

## 2022-06-01 DIAGNOSIS — E119 Type 2 diabetes mellitus without complications: Secondary | ICD-10-CM | POA: Diagnosis not present

## 2022-06-01 DIAGNOSIS — G6289 Other specified polyneuropathies: Secondary | ICD-10-CM | POA: Diagnosis not present

## 2022-06-01 MED ORDER — PREGABALIN 100 MG PO CAPS
ORAL_CAPSULE | ORAL | 1 refills | Status: DC
  Filled 2022-06-01: qty 30, 30d supply, fill #0

## 2022-06-01 MED ORDER — METFORMIN HCL 500 MG PO TABS
500.0000 mg | ORAL_TABLET | Freq: Two times a day (BID) | ORAL | 3 refills | Status: DC
  Filled 2022-06-01 – 2022-06-18 (×2): qty 180, 90d supply, fill #0
  Filled 2022-09-17: qty 180, 90d supply, fill #1
  Filled 2022-12-10: qty 180, 90d supply, fill #2

## 2022-06-01 MED ORDER — GABAPENTIN 100 MG PO CAPS
100.0000 mg | ORAL_CAPSULE | Freq: Three times a day (TID) | ORAL | 1 refills | Status: DC
  Filled 2022-06-01: qty 60, 20d supply, fill #0
  Filled 2022-06-18: qty 60, 20d supply, fill #1

## 2022-06-01 NOTE — Progress Notes (Signed)
Established Patient Office Visit  Subjective:  Patient ID: Lawrence Murray, male    DOB: 1966-01-22  Age: 56 y.o. MRN: [id number]  CC:  Chief Complaint  Patient presents with   Diabetes     HPI  Naseem Varden presents for routine follow up.  Patient has history of CHF, CAD, diabetes, hypertension, hyperlipidemia and anxiety. Patient complaint of aches and pains.    HPI   Past Medical History:  Diagnosis Date   CHF (congestive heart failure) ([place])    Coronary artery disease    Diabetes mellitus without complication ([place])    Hypertension     Past Surgical History:  Procedure Laterality Date   COLONOSCOPY WITH PROPOFOL N/A 06/14/2022   Procedure: COLONOSCOPY WITH PROPOFOL;  Surgeon: [name], MD;  Location: [facility] ENDOSCOPY;  Service: Gastroenterology;  Laterality: N/A;   LOWER EXTREMITY ANGIOGRAPHY Left 02/14/2022   Procedure: Lower Extremity Angiography;  Surgeon: Algernon Huxley, MD;  Location: [place] [facility];  Service: Cardiovascular;  Laterality: Left;   RIGHT/LEFT HEART CATH AND CORONARY ANGIOGRAPHY N/A 02/21/2022   Procedure: RIGHT/LEFT HEART CATH AND CORONARY ANGIOGRAPHY;  Surgeon: Nelva Bush, MD;  Location: [place] [facility];  Service: Cardiovascular;  Laterality: N/A;    Family History  Problem Relation Age of Onset   Congestive Heart Failure Father     Social History   Socioeconomic History   Marital status: Single    Spouse name: Not on file   Number of children: Not on file   Years of education: Not on file   Highest education level: Not on file  Occupational History   Not on file  Tobacco Use   Smoking status: Former    Packs/day: 1.00    Years: 23.00    Total pack years: 23.00    Types: Cigarettes    Quit date: 02/04/2022    Years since quitting: 0.3   Smokeless tobacco: Never  Vaping Use   Vaping Use: Not on file  Substance and Sexual Activity   Alcohol use: Not Currently   Drug use: Never   Sexual activity: Not Currently  Other  Topics Concern   Not on file  Social History Narrative   Not on file   Social Determinants of Health   Financial Resource Strain: High Risk (03/09/2022)   Overall Financial Resource Strain (CARDIA)    Difficulty of Paying Living Expenses: Hard  Food Insecurity: Food Insecurity Present (03/09/2022)   Hunger Vital Sign    Worried About Running Out of Food in the Last Year: Sometimes true    Ran Out of Food in the Last Year: Sometimes true  Transportation Needs: No Transportation Needs (03/09/2022)   PRAPARE - Hydrologist (Medical): No    Lack of Transportation (Non-Medical): No  Physical Activity: Not on file  Stress: Not on file  Social Connections: Not on file  Intimate Partner Violence: Not on file     Outpatient Medications Prior to Visit  Medication Sig Dispense Refill   Accu-Chek Softclix Lancets lancets Use as directed to check blood glucose daily 100 each 0   acetaminophen (TYLENOL) 500 MG tablet Take 2 tablets (1,000 mg total) by mouth every 6 (six) hours as needed for mild pain, moderate pain, fever or headache. 30 tablet 0   ALPRAZolam (XANAX) 1 MG tablet Take 0.5 tablets (0.5 mg total) by mouth 3 (three) times daily as needed for anxiety. 90 tablet 0   aspirin EC 81 MG tablet Take  1 tablet (81 mg total) by mouth daily. Swallow whole. 30 tablet 12   atorvastatin (LIPITOR) 80 MG tablet Take 1 tablet (80 mg total) by mouth daily. 90 tablet 0   Blood Glucose Monitoring Suppl (ACCU-CHEK GUIDE ME) w/Device KIT Use as directed daily 1 kit 0   dapagliflozin propanediol (FARXIGA) 10 MG TABS tablet Take one tablet by mouth daily 90 tablet 3   digoxin (LANOXIN) 0.125 MG tablet Take 1 tablet (0.125 mg total) by mouth daily. 90 tablet 0   docusate sodium (STOOL SOFTENER) 250 MG capsule Take 250 mg by mouth daily.     furosemide (LASIX) 40 MG tablet Take 1.5 tablets (60 mg total) by mouth daily. 270 tablet 0   glucose blood test strip Use as directed once  daily 100 each 0   metoprolol succinate (TOPROL-XL) 25 MG 24 hr tablet Take 0.5 tablets (12.5 mg total) by mouth daily. 30 tablet 0   pantoprazole (PROTONIX) 40 MG tablet Take 1 tablet (40 mg total) by mouth daily. 30 tablet 11   potassium chloride SA (KLOR-CON M) 20 MEQ tablet Take 1 tablet (20 mEq total) by mouth daily. 30 tablet 11   metFORMIN (GLUCOPHAGE) 500 MG tablet Take 1 tablet (500 mg total) by mouth 2 (two) times daily. 60 tablet 11   pregabalin (LYRICA) 100 MG capsule Take 1 capsule by mouth three times a day 90 capsule 0   spironolactone (ALDACTONE) 25 MG tablet Take 0.5 tablets (12.5 mg total) by mouth daily. 30 tablet 11   No facility-administered medications prior to visit.    No Known Allergies  ROS Review of Systems  Constitutional: Negative.   HENT: Negative.    Eyes: Negative.   Respiratory:  Negative for chest tightness and shortness of breath.   Cardiovascular:  Negative for chest pain and palpitations.  Gastrointestinal: Negative.   Genitourinary: Negative.   Musculoskeletal:  Positive for arthralgias, back pain and joint swelling.  Neurological:  Negative for dizziness, facial asymmetry and headaches.  Psychiatric/Behavioral:  Negative for agitation, behavioral problems and confusion.       Objective:    Physical Exam Constitutional:      Appearance: Normal appearance. He is normal weight.  HENT:     Right Ear: Tympanic membrane normal.     Left Ear: Tympanic membrane normal.     Nose: Nose normal.     Mouth/Throat:     Mouth: Mucous membranes are moist.     Pharynx: Oropharynx is clear.  Eyes:     Pupils: Pupils are equal, round, and reactive to light.  Cardiovascular:     Rate and Rhythm: Normal rate and regular rhythm.     Pulses: Normal pulses.  Pulmonary:     Effort: Pulmonary effort is normal.  Abdominal:     General: Bowel sounds are normal.     Palpations: Abdomen is soft.  Musculoskeletal:     Right lower leg: Edema present.      Left lower leg: Edema present.  Skin:    General: Skin is warm.     Coloration: Skin is not jaundiced or pale.  Neurological:     General: No focal deficit present.     Mental Status: He is alert and oriented to person, place, and time. Mental status is at baseline.  Psychiatric:        Mood and Affect: Mood normal.        Behavior: Behavior normal.        Thought Content: Thought  content normal.        Judgment: Judgment normal.     BP 111/75   Pulse 82   Ht 6' 0.3" (1.836 m)   Wt 255 lb 4.8 oz (115.8 kg)   BMI 34.34 kg/m  Wt Readings from Last 3 Encounters:  06/14/22 251 lb (113.9 kg)  06/01/22 255 lb 4.8 oz (115.8 kg)  05/30/22 254 lb 14.4 oz (115.6 kg)     Health Maintenance  Topic Date Due   OPHTHALMOLOGY EXAM  Never done   Diabetic kidney evaluation - Urine ACR  Never done   Lung Cancer Screening  Never done   Hepatitis C Screening  06/02/2023 (Originally 06/09/1985)   INFLUENZA VACCINE  06/20/2023 (Originally 03/20/2022)   COVID-19 Vaccine (1) 06/20/2023 (Originally 12/09/1966)   Zoster Vaccines- Shingrix (2 of 2) 08/03/2022   HEMOGLOBIN A1C  08/07/2022   FOOT EXAM  05/16/2023   Diabetic kidney evaluation - GFR measurement  06/09/2023   COLONOSCOPY (Pts 45-39yr Insurance coverage will need to be confirmed)  06/14/2029   TETANUS/TDAP  06/01/2032   HIV Screening  Completed   HPV VACCINES  Aged Out    There are no preventive care reminders to display for this patient.  Lab Results  Component Value Date   TSH 9.011 (H) 02/05/2022   Lab Results  Component Value Date   WBC 7.3 02/23/2022   HGB 13.3 02/23/2022   HCT 40.8 02/23/2022   MCV 94.0 02/23/2022   PLT 368 02/23/2022   Lab Results  Component Value Date   NA 136 06/08/2022   K 4.7 06/08/2022   CO2 27 06/08/2022   GLUCOSE 192 (H) 06/08/2022   BUN 19 06/08/2022   CREATININE 1.10 06/08/2022   BILITOT 1.0 02/11/2022   ALKPHOS 78 02/11/2022   AST 22 02/11/2022   ALT 27 02/11/2022   PROT 6.8  02/11/2022   ALBUMIN 3.1 (L) 02/11/2022   CALCIUM 9.7 06/08/2022   ANIONGAP 9 06/08/2022   Lab Results  Component Value Date   CHOL 242 (H) 02/24/2022   Lab Results  Component Value Date   HDL 35 (L) 02/24/2022   Lab Results  Component Value Date   LDLCALC 160 (H) 02/24/2022   Lab Results  Component Value Date   TRIG 235 (H) 02/24/2022   Lab Results  Component Value Date   CHOLHDL 6.9 02/24/2022   Lab Results  Component Value Date   HGBA1C 7.7 (H) 02/05/2022      Assessment & Plan:   Problem List Items Addressed This Visit       Endocrine   Type 2 diabetes mellitus without complication, without long-term current use of insulin (HCC) - Primary    Continue metformin 500 mg twice daily. Advised pt to check the BS regularly, make a log and bring to next appointment.  Advised pt to monitor diet. Advised pt to eat variety of food including fruits, vegetables, whole grains, complex carbohydrates and proteins.        Relevant Medications   metFORMIN (GLUCOPHAGE) 500 MG tablet   Other Relevant Orders   Microalbumin, urine (Completed)     Nervous and Auditory   Peripheral neuropathy    Started him on gabapentin, discontinue Lyrica. Would refer him to veins and vascular.      Relevant Medications   gabapentin (NEURONTIN) 100 MG capsule     Other   Lower extremity edema    Advised patient to keep the feet elevated. Wrap the lower extremity using elastic bandages.  Relevant Orders   Ambulatory referral to Vascular Surgery   Other Visit Diagnoses     Scratch of left forearm, initial encounter       Relevant Orders   Tdap vaccine greater than or equal to 7yo IM (Completed)        Meds ordered this encounter  Medications   metFORMIN (GLUCOPHAGE) 500 MG tablet    Sig: Take 1 tablet (500 mg total) by mouth 2 (two) times daily.    Dispense:  180 tablet    Refill:  3   DISCONTD: pregabalin (LYRICA) 100 MG capsule    Sig: Take 1 capsule by mouth  three times a day    Dispense:  90 capsule    Refill:  1   gabapentin (NEURONTIN) 100 MG capsule    Sig: Take 1 capsule (100 mg total) by mouth 3 (three) times daily.    Dispense:  60 capsule    Refill:  1     Follow-up: No follow-ups on file.    Theresia Lo, NP

## 2022-06-01 NOTE — Telephone Encounter (Signed)
Please advise 

## 2022-06-02 LAB — MICROALBUMIN, URINE: Microalb, Ur: <0.2 mg/dL

## 2022-06-04 ENCOUNTER — Encounter: Payer: Medicaid Other | Admitting: *Deleted

## 2022-06-04 ENCOUNTER — Telehealth: Payer: Self-pay | Admitting: *Deleted

## 2022-06-04 DIAGNOSIS — I5042 Chronic combined systolic (congestive) and diastolic (congestive) heart failure: Secondary | ICD-10-CM

## 2022-06-04 DIAGNOSIS — I5040 Unspecified combined systolic (congestive) and diastolic (congestive) heart failure: Secondary | ICD-10-CM | POA: Diagnosis not present

## 2022-06-04 LAB — GLUCOSE, CAPILLARY
Glucose-Capillary: 178 mg/dL — ABNORMAL HIGH (ref 70–99)
Glucose-Capillary: 199 mg/dL — ABNORMAL HIGH (ref 70–99)

## 2022-06-04 NOTE — Progress Notes (Signed)
Daily Session Note  Patient Details  Name: Lawrence Murray MRN: [id number] Date of Birth: 01-22-1966 Referring Provider:   Flowsheet Row Cardiac Rehab from 05/30/2022 in [facility] Cardiac and Pulmonary Rehab  Referring Provider Ernestina Penna MD       Encounter Date: 06/04/2022  Check In:  Session Check In - 06/04/22 [place]       Check-In   Supervising physician immediately available to respond to emergencies See telemetry face sheet for immediately available ER MD    Location [facility]-Cardiac & Pulmonary Rehab    Staff Present Justin Mend, RCP,RRT,BSRT;Doyel Mulkern Sherryll Burger, RN Margurite Auerbach, MS, ASCM CEP, Exercise Physiologist;Noah Tickle, BS, Exercise Physiologist    Virtual Visit No    Medication changes reported     Yes    Comments off lyrica & robaxin, started gabapentin, increase xanax    Fall or balance concerns reported    No    Warm-up and Cool-down Performed on first and last piece of equipment    Resistance Training Performed Yes    VAD Patient? No    PAD/SET Patient? No      Pain Assessment   Currently in Pain? No/denies                Social History   Tobacco Use  Smoking Status Former   Packs/day: 1.00   Years: 23.00   Total pack years: 23.00   Types: Cigarettes   Quit date: 02/04/2022   Years since quitting: 0.3  Smokeless Tobacco Never    Goals Met:  Independence with exercise equipment Exercise tolerated well No report of concerns or symptoms today Strength training completed today  Goals Unmet:  Not Applicable  Comments: Pt able to follow exercise prescription today without complaint.  Will continue to monitor for progression.    Dr. Emily Filbert is Medical Director for [place].  Dr. Ottie Glazier is Medical Director for [facility] Pulmonary Rehabilitation.

## 2022-06-05 ENCOUNTER — Other Ambulatory Visit: Payer: Self-pay

## 2022-06-05 DIAGNOSIS — R609 Edema, unspecified: Secondary | ICD-10-CM | POA: Diagnosis not present

## 2022-06-06 ENCOUNTER — Other Ambulatory Visit: Payer: Self-pay

## 2022-06-06 ENCOUNTER — Encounter: Payer: Medicaid Other | Admitting: *Deleted

## 2022-06-06 DIAGNOSIS — I5040 Unspecified combined systolic (congestive) and diastolic (congestive) heart failure: Secondary | ICD-10-CM | POA: Diagnosis not present

## 2022-06-06 DIAGNOSIS — I5042 Chronic combined systolic (congestive) and diastolic (congestive) heart failure: Secondary | ICD-10-CM

## 2022-06-06 LAB — GLUCOSE, CAPILLARY
Glucose-Capillary: 166 mg/dL — ABNORMAL HIGH (ref 70–99)
Glucose-Capillary: 158 mg/dL — ABNORMAL HIGH (ref 70–99)

## 2022-06-06 MED ORDER — ZOSTER VAC RECOMB ADJUVANTED 50 MCG/0.5ML IM SUSR
INTRAMUSCULAR | 1 refills | Status: DC
  Filled 2022-06-08: qty 0.5, 1d supply, fill #0
  Filled 2022-06-18: qty 0.5, 1d supply, fill #1

## 2022-06-06 NOTE — Progress Notes (Signed)
Daily Session Note  Patient Details  Name: Lawrence Murray MRN: [id number] Date of Birth: [birth year] Referring Provider:   Flowsheet Row Cardiac Rehab from 05/30/2022 in [facility] Cardiac and Pulmonary Rehab  Referring Provider Ernestina Penna MD       Encounter Date: 06/06/2022  Check In:  Session Check In - 06/06/22 1617       Check-In   Supervising physician immediately available to respond to emergencies See telemetry face sheet for immediately available ER MD    Location [facility]-Cardiac & Pulmonary Rehab    Staff Present Earlean Shawl, BS, ACSM CEP, Exercise Physiologist;Meredith Sherryll Burger, RN Odelia Gage, RN, ADN    Virtual Visit No    Medication changes reported     No    Fall or balance concerns reported    No    Warm-up and Cool-down Performed on first and last piece of equipment    Resistance Training Performed Yes    VAD Patient? No    PAD/SET Patient? No      Pain Assessment   Currently in Pain? No/denies                Social History   Tobacco Use  Smoking Status Former   Packs/day: 1.00   Years: 23.00   Total pack years: 23.00   Types: Cigarettes   Quit date: 02/04/2022   Years since quitting: 0.3  Smokeless Tobacco Never    Goals Met:  Independence with exercise equipment Exercise tolerated well No report of concerns or symptoms today Strength training completed today  Goals Unmet:  Not Applicable  Comments: Pt able to follow exercise prescription today without complaint.  Will continue to monitor for progression.    Dr. Emily Filbert is Medical Director for [place].  Dr. Ottie Glazier is Medical Director for [facility] Pulmonary Rehabilitation.

## 2022-06-07 ENCOUNTER — Encounter: Payer: Medicaid Other | Admitting: *Deleted

## 2022-06-07 DIAGNOSIS — I5040 Unspecified combined systolic (congestive) and diastolic (congestive) heart failure: Secondary | ICD-10-CM | POA: Diagnosis not present

## 2022-06-07 DIAGNOSIS — I5042 Chronic combined systolic (congestive) and diastolic (congestive) heart failure: Secondary | ICD-10-CM

## 2022-06-07 NOTE — Progress Notes (Signed)
Daily Session Note  Patient Details  Name: Lawrence Murray MRN: [id number] Date of Birth: Dec 20, 1965 Referring Provider:   Flowsheet Row Cardiac Rehab from 05/30/2022 in [facility] Cardiac and Pulmonary Rehab  Referring Provider Ernestina Penna MD       Encounter Date: 06/07/2022  Check In:  Session Check In - 06/07/22 1542       Check-In   Supervising physician immediately available to respond to emergencies See telemetry face sheet for immediately available ER MD    Location [facility]-Cardiac & Pulmonary Rehab    Staff Present Justin Mend, Lorre Nick, MA, RCEP, CCRP, CCET;Kiko Ripp Sherryll Burger, RN BSN    Virtual Visit No    Medication changes reported     No    Fall or balance concerns reported    No    Warm-up and Cool-down Performed on first and last piece of equipment    Resistance Training Performed Yes    VAD Patient? No    PAD/SET Patient? No      Pain Assessment   Currently in Pain? No/denies                Social History   Tobacco Use  Smoking Status Former   Packs/day: 1.00   Years: 23.00   Total pack years: 23.00   Types: Cigarettes   Quit date: 02/04/2022   Years since quitting: 0.3  Smokeless Tobacco Never    Goals Met:  Independence with exercise equipment Exercise tolerated well No report of concerns or symptoms today Strength training completed today  Goals Unmet:  Not Applicable  Comments: Pt able to follow exercise prescription today without complaint.  Will continue to monitor for progression.    Dr. Emily Filbert is Medical Director for [place].  Dr. Ottie Glazier is Medical Director for [facility] Pulmonary Rehabilitation.

## 2022-06-08 ENCOUNTER — Other Ambulatory Visit: Payer: Self-pay

## 2022-06-08 ENCOUNTER — Other Ambulatory Visit
Admission: RE | Admit: 2022-06-08 | Discharge: 2022-06-08 | Disposition: A | Discharge status: Home / Self Care | Payer: Medicaid Other | Source: Ambulatory Visit | Attending: Medical | Admitting: Medical

## 2022-06-08 DIAGNOSIS — I5043 Acute on chronic combined systolic (congestive) and diastolic (congestive) heart failure: Secondary | ICD-10-CM | POA: Diagnosis not present

## 2022-06-08 LAB — BASIC METABOLIC PANEL
Anion gap: 9 (ref 5–15)
BUN: 19 mg/dL (ref 6–20)
CO2: 27 mmol/L (ref 22–32)
Calcium: 9.7 mg/dL (ref 8.9–10.3)
Chloride: 100 mmol/L (ref 98–111)
Creatinine, Ser: 1.1 mg/dL (ref 0.61–1.24)
GFR, Estimated: >60 mL/min (ref >60)
Glucose, Bld: 192 mg/dL — ABNORMAL HIGH (ref 70–99)
Potassium: 4.7 mmol/L (ref 3.5–5.1)
Sodium: 136 mmol/L (ref 135–145)

## 2022-06-09 NOTE — Telephone Encounter (Signed)
error 

## 2022-06-11 ENCOUNTER — Encounter: Payer: Medicaid Other | Admitting: *Deleted

## 2022-06-11 DIAGNOSIS — I5040 Unspecified combined systolic (congestive) and diastolic (congestive) heart failure: Secondary | ICD-10-CM | POA: Diagnosis not present

## 2022-06-11 DIAGNOSIS — I5042 Chronic combined systolic (congestive) and diastolic (congestive) heart failure: Secondary | ICD-10-CM

## 2022-06-11 NOTE — Progress Notes (Signed)
Daily Session Note  Patient Details  Name: Lawrence Murray MRN: [id number] Date of Birth: 01-19-1966 Referring Provider:   Flowsheet Row Cardiac Rehab from 05/30/2022 in [facility] Cardiac and Pulmonary Rehab  Referring Provider Ernestina Penna MD       Encounter Date: 06/11/2022  Check In:  Session Check In - 06/11/22 1607       Check-In   Staff Present Alberteen Sam, MA, RCEP, CCRP, CCET;Joseph Lafayette, [place], RN, Dimple Nanas, BS, Exercise Physiologist    Virtual Visit No    Medication changes reported     No    Fall or balance concerns reported    No    Warm-up and Cool-down Performed on first and last piece of equipment    Resistance Training Performed Yes    VAD Patient? No    PAD/SET Patient? No      Pain Assessment   Currently in Pain? No/denies                Social History   Tobacco Use  Smoking Status Former   Packs/day: 1.00   Years: 23.00   Total pack years: 23.00   Types: Cigarettes   Quit date: 02/04/2022   Years since quitting: 0.3  Smokeless Tobacco Never    Goals Met:  Independence with exercise equipment Exercise tolerated well No report of concerns or symptoms today Strength training completed today  Goals Unmet:  Not Applicable  Comments: Pt able to follow exercise prescription today without complaint.  Will continue to monitor for progression.    Dr. Emily Filbert is Medical Director for [place].  Dr. Ottie Glazier is Medical Director for [facility] Pulmonary Rehabilitation.

## 2022-06-12 ENCOUNTER — Encounter: Payer: Self-pay | Admitting: Nurse Practitioner

## 2022-06-13 ENCOUNTER — Other Ambulatory Visit: Payer: Self-pay

## 2022-06-14 ENCOUNTER — Encounter
Admission: RE | Disposition: A | Discharge status: Home / Self Care | Payer: Self-pay | Source: Ambulatory Visit | Attending: Gastroenterology

## 2022-06-14 ENCOUNTER — Ambulatory Visit: Payer: Medicaid Other | Admitting: Anesthesiology

## 2022-06-14 ENCOUNTER — Ambulatory Visit
Admission: RE | Admit: 2022-06-14 | Discharge: 2022-06-14 | Disposition: A | Discharge status: Home / Self Care | Payer: Medicaid Other | Source: Ambulatory Visit | Attending: Gastroenterology | Admitting: Gastroenterology

## 2022-06-14 DIAGNOSIS — K621 Rectal polyp: Secondary | ICD-10-CM | POA: Diagnosis not present

## 2022-06-14 DIAGNOSIS — E1151 Type 2 diabetes mellitus with diabetic peripheral angiopathy without gangrene: Secondary | ICD-10-CM | POA: Diagnosis not present

## 2022-06-14 DIAGNOSIS — I2510 Atherosclerotic heart disease of native coronary artery without angina pectoris: Secondary | ICD-10-CM | POA: Insufficient documentation

## 2022-06-14 DIAGNOSIS — Z1211 Encounter for screening for malignant neoplasm of colon: Secondary | ICD-10-CM | POA: Diagnosis not present

## 2022-06-14 DIAGNOSIS — I10 Essential (primary) hypertension: Secondary | ICD-10-CM | POA: Diagnosis not present

## 2022-06-14 DIAGNOSIS — I110 Hypertensive heart disease with heart failure: Secondary | ICD-10-CM | POA: Insufficient documentation

## 2022-06-14 DIAGNOSIS — F419 Anxiety disorder, unspecified: Secondary | ICD-10-CM | POA: Diagnosis not present

## 2022-06-14 DIAGNOSIS — D126 Benign neoplasm of colon, unspecified: Secondary | ICD-10-CM

## 2022-06-14 DIAGNOSIS — D125 Benign neoplasm of sigmoid colon: Secondary | ICD-10-CM | POA: Diagnosis not present

## 2022-06-14 DIAGNOSIS — I509 Heart failure, unspecified: Secondary | ICD-10-CM | POA: Diagnosis not present

## 2022-06-14 DIAGNOSIS — G709 Myoneural disorder, unspecified: Secondary | ICD-10-CM | POA: Insufficient documentation

## 2022-06-14 DIAGNOSIS — Z87891 Personal history of nicotine dependence: Secondary | ICD-10-CM | POA: Insufficient documentation

## 2022-06-14 DIAGNOSIS — K635 Polyp of colon: Secondary | ICD-10-CM | POA: Diagnosis not present

## 2022-06-14 HISTORY — PX: COLONOSCOPY WITH PROPOFOL: SHX5780

## 2022-06-14 LAB — GLUCOSE, CAPILLARY: Glucose-Capillary: 168 mg/dL — ABNORMAL HIGH (ref 70–99)

## 2022-06-14 SURGERY — COLONOSCOPY WITH PROPOFOL
Anesthesia: General

## 2022-06-14 MED ORDER — STERILE WATER FOR IRRIGATION IR SOLN
Status: DC | PRN
  Administered 2022-06-14: 60 mL

## 2022-06-14 MED ORDER — PROPOFOL 500 MG/50ML IV EMUL
INTRAVENOUS | Status: DC | PRN
  Administered 2022-06-14: 50 mg via INTRAVENOUS
  Administered 2022-06-14: 130 ug/kg/min via INTRAVENOUS

## 2022-06-14 MED ORDER — PHENYLEPHRINE HCL (PRESSORS) 10 MG/ML IV SOLN
INTRAVENOUS | Status: DC | PRN
  Administered 2022-06-14: 160 ug via INTRAVENOUS
  Administered 2022-06-14: 240 ug via INTRAVENOUS

## 2022-06-14 MED ORDER — LIDOCAINE HCL (CARDIAC) PF 100 MG/5ML IV SOSY
PREFILLED_SYRINGE | INTRAVENOUS | Status: DC | PRN
  Administered 2022-06-14: 50 mg via INTRAVENOUS

## 2022-06-14 MED ORDER — SODIUM CHLORIDE 0.9 % IV SOLN
INTRAVENOUS | Status: DC
  Administered 2022-06-14: 20 mL/h via INTRAVENOUS

## 2022-06-14 NOTE — H&P (Signed)
Lawrence Bellows, MD [address] [place], [place], [place], [address] [address], [place], [place], [place], [address] Phone: [phone]  Fax: [phone]  Primary Care Physician:  Theresia Lo, NP   Pre-Procedure History & Physical: HPI:  Lawrence Murray is a 56 y.o. male is here for an colonoscopy.   Past Medical History:  Diagnosis Date   CHF (congestive heart failure) (HCC)    Coronary artery disease    Diabetes mellitus without complication ([place])    Hypertension     Past Surgical History:  Procedure Laterality Date   LOWER EXTREMITY ANGIOGRAPHY Left 02/14/2022   Procedure: Lower Extremity Angiography;  Surgeon: Algernon Huxley, MD;  Location: [place] CV LAB;  Service: Cardiovascular;  Laterality: Left;   RIGHT/LEFT HEART CATH AND CORONARY ANGIOGRAPHY N/A 02/21/2022   Procedure: RIGHT/LEFT HEART CATH AND CORONARY ANGIOGRAPHY;  Surgeon: Nelva Bush, MD;  Location: [place] CV LAB;  Service: Cardiovascular;  Laterality: N/A;    Prior to Admission medications   Medication Sig Start Date End Date Taking? Authorizing Provider  Accu-Chek Softclix Lancets lancets Use as directed to check blood glucose daily 04/13/22  Yes   acetaminophen (TYLENOL) 500 MG tablet Take 2 tablets (1,000 mg total) by mouth every 6 (six) hours as needed for mild pain, moderate pain, fever or headache. 03/01/22  Yes Emeterio Reeve, DO  ALPRAZolam Duanne Moron) 1 MG tablet Take 0.5 tablets (0.5 mg total) by mouth 3 (three) times daily as needed for anxiety. 05/04/22  Yes Theresia Lo, NP  aspirin EC 81 MG tablet Take 1 tablet (81 mg total) by mouth daily. Swallow whole. 03/02/22  Yes Emeterio Reeve, DO  atorvastatin (LIPITOR) 80 MG tablet Take 1 tablet (80 mg total) by mouth daily. 04/27/22  Yes Furth, Cadence H, PA-C  Blood Glucose Monitoring Suppl (ACCU-CHEK GUIDE ME) w/Device KIT Use as directed daily 04/13/22  Yes   dapagliflozin propanediol (FARXIGA) 10 MG TABS tablet Take one  tablet by mouth daily 04/06/22  Yes   digoxin (LANOXIN) 0.125 MG tablet Take 1 tablet (0.125 mg total) by mouth daily. 04/27/22  Yes Furth, Cadence H, PA-C  docusate sodium (STOOL SOFTENER) 250 MG capsule Take 250 mg by mouth daily.   Yes [provider]  furosemide (LASIX) 40 MG tablet Take 1.5 tablets (60 mg total) by mouth daily. 05/28/22  Yes Furth, Cadence H, PA-C  gabapentin (NEURONTIN) 100 MG capsule Take 1 capsule (100 mg total) by mouth 3 (three) times daily. 06/01/22  Yes Theresia Lo, NP  glucose blood test strip Use as directed once daily 04/13/22  Yes   metFORMIN (GLUCOPHAGE) 500 MG tablet Take 1 tablet (500 mg total) by mouth 2 (two) times daily. 06/01/22  Yes Theresia Lo, NP  metoprolol succinate (TOPROL-XL) 25 MG 24 hr tablet Take 0.5 tablets (12.5 mg total) by mouth daily. 03/30/22  Yes Furth, Cadence H, PA-C  pantoprazole (PROTONIX) 40 MG tablet Take 1 tablet (40 mg total) by mouth daily. 04/26/22  Yes Agbor-Etang, Aaron Edelman, MD  potassium chloride SA (KLOR-CON M) 20 MEQ tablet Take 1 tablet (20 mEq total) by mouth daily. 04/26/22  Yes Kate Sable, MD  spironolactone (ALDACTONE) 25 MG tablet Take 0.5 tablets (12.5 mg total) by mouth daily. 04/26/22  Yes Kate Sable, MD  Zoster Vaccine Adjuvanted [facility]) injection Inject into the muscle. 06/06/22  Yes Carlyle Basques, MD  QUEtiapine (SEROQUEL) 25 MG tablet Take 1 tablet (25 mg total) by mouth at bedtime. 04/26/22 05/31/22  Kate Sable,  MD    Allergies as of 05/11/2022   (No Known Allergies)    Family History  Problem Relation Age of Onset   Congestive Heart Failure Father     Social History   Socioeconomic History   Marital status: Single    Spouse name: Not on file   Number of children: Not on file   Years of education: Not on file   Highest education level: Not on file  Occupational History   Not on file  Tobacco Use   Smoking status: Former    Packs/day: 1.00    Years: 23.00    Total  pack years: 23.00    Types: Cigarettes    Quit date: 02/04/2022    Years since quitting: 0.3   Smokeless tobacco: Never  Vaping Use   Vaping Use: Not on file  Substance and Sexual Activity   Alcohol use: Not Currently   Drug use: Never   Sexual activity: Not Currently  Other Topics Concern   Not on file  Social History Narrative   Not on file   Social Determinants of Health   Financial Resource Strain: High Risk (03/09/2022)   Overall Financial Resource Strain (CARDIA)    Difficulty of Paying Living Expenses: Hard  Food Insecurity: Food Insecurity Present (03/09/2022)   Hunger Vital Sign    Worried About Running Out of Food in the Last Year: Sometimes true    Ran Out of Food in the Last Year: Sometimes true  Transportation Needs: No Transportation Needs (03/09/2022)   PRAPARE - Hydrologist (Medical): No    Lack of Transportation (Non-Medical): No  Physical Activity: Not on file  Stress: Not on file  Social Connections: Not on file  Intimate Partner Violence: Not on file    Review of Systems: See HPI, otherwise negative ROS  Physical Exam: BP 95/65   Pulse 71   Temp (!) 96.9 F (36.1 C) (Temporal)   Resp 20   Ht 6' 1"  (1.854 m)   Wt 113.9 kg   SpO2 100%   BMI 33.12 kg/m  General:   Alert,  pleasant and cooperative in NAD Head:  Normocephalic and atraumatic. Neck:  Supple; no masses or thyromegaly. Lungs:  Clear throughout to auscultation, normal respiratory effort.    Heart:  +S1, +S2, Regular rate and rhythm, No edema. Abdomen:  Soft, nontender and nondistended. Normal bowel sounds, without guarding, and without rebound.   Neurologic:  Alert and  oriented x4;  grossly normal neurologically.  Impression/Plan: Lawrence Murray is here for an colonoscopy to be performed for Screening colonoscopy average risk   Risks, benefits, limitations, and alternatives regarding  colonoscopy have been reviewed with the patient.  Questions have been  answered.  All parties agreeable.   Lawrence Bellows, MD  06/14/2022, 9:30 AM

## 2022-06-14 NOTE — Op Note (Signed)
[facility] Patient Name: Lawrence Murray Procedure Date: 06/14/2022 9:36 AM MRN: [id number] Account #: [id number] Date of Birth: [birth year] Admit Type: Outpatient Age: 56 Room: [facility] ENDO ROOM 3 Gender: Male Note Status: Finalized Instrument Name: Jasper Riling [id number] Procedure:             Colonoscopy Indications:           Screening for colorectal malignant neoplasm Providers:             [name] MD, MD Referring MD:          Theresia Lo (Referring MD) Medicines:             Monitored Anesthesia Care Complications:         No immediate complications. Procedure:             Pre-Anesthesia Assessment:                        - Prior to the procedure, a History and Physical was                         performed, and patient medications, allergies and                         sensitivities were reviewed. The patient's tolerance                         of previous anesthesia was reviewed.                        - The risks and benefits of the procedure and the                         sedation options and risks were discussed with the                         patient. All questions were answered and informed                         consent was obtained.                        - ASA Grade Assessment: II - A patient with mild                         systemic disease.                        After obtaining informed consent, the colonoscope was                         passed under direct vision. Throughout the procedure,                         the patient's blood pressure, pulse, and oxygen                         saturations were monitored continuously. The                         Colonoscope was introduced through  the anus and                         advanced to the the cecum, identified by the                         appendiceal orifice. The colonoscopy was performed                         with ease. The patient tolerated the procedure well.                          The quality of the bowel preparation was excellent. Findings:      The perianal and digital rectal examinations were normal.      Two sessile polyps were found in the sigmoid colon. The polyps were 4 to       5 mm in size. These polyps were removed with a cold snare. Resection and       retrieval were complete.      A 5 mm polyp was found in the rectum. The polyp was sessile. The polyp       was removed with a cold snare. Resection and retrieval were complete.      The exam was otherwise without abnormality on direct and retroflexion       views. Impression:            - Two 4 to 5 mm polyps, removed with a cold snare.                         Resected and retrieved.                        - One 5 mm polyp in the rectum, removed with a cold                         snare. Resected and retrieved.                        - The examination was otherwise normal on direct and                         retroflexion views. Recommendation:        - Discharge patient to home (with escort).                        - Resume previous diet.                        - Continue present medications.                        - Await pathology results.                        - Repeat colonoscopy for surveillance based on                         pathology results. Procedure Code(s):     --- Professional ---  45385, Colonoscopy, flexible; with removal of                         tumor(s), polyp(s), or other lesion(s) by snare                         technique Diagnosis Code(s):     --- Professional ---                        Z12.11, Encounter for screening for malignant neoplasm                         of colon                        D12.6, Benign neoplasm of colon, unspecified                        D12.8, Benign neoplasm of rectum CPT copyright 2022 [employer]. All rights reserved. The codes documented in this report are preliminary and upon coder review  may  be revised to meet current compliance requirements. [name], MD [name] MD, MD 06/14/2022 10:09:47 AM This report has been signed electronically. Number of Addenda: 0 Note Initiated On: 06/14/2022 9:36 AM Scope Withdrawal Time: 0 hours 15 minutes 46 seconds  Total Procedure Duration: 0 hours 21 minutes 36 seconds  Estimated Blood Loss:  Estimated blood loss: none.      [facility]

## 2022-06-14 NOTE — Anesthesia Preprocedure Evaluation (Signed)
Anesthesia Evaluation  Patient identified by MRN, date of birth, ID band Patient awake    Reviewed: Allergy & Precautions, H&P , NPO status , Patient's Chart, lab work & pertinent test results, reviewed documented beta blocker date and time   Airway Mallampati: II   Neck ROM: full    Dental  (+) Poor Dentition   Pulmonary neg pulmonary ROS, former smoker,    Pulmonary exam normal        Cardiovascular Exercise Tolerance: Poor hypertension, On Medications + CAD, + Peripheral Vascular Disease and +CHF  Normal cardiovascular exam Rhythm:regular Rate:Normal     Neuro/Psych Anxiety  Neuromuscular disease negative psych ROS   GI/Hepatic negative GI ROS, Neg liver ROS,   Endo/Other  negative endocrine ROSdiabetes, Well Controlled  Renal/GU negative Renal ROS  negative genitourinary   Musculoskeletal   Abdominal   Peds  Hematology negative hematology ROS (+)   Anesthesia Other Findings Past Medical History: No date: CHF (congestive heart failure) (HCC) No date: Coronary artery disease No date: Diabetes mellitus without complication ([place]) No date: Hypertension Past Surgical History: 02/14/2022: LOWER EXTREMITY ANGIOGRAPHY; Left     Comment:  Procedure: Lower Extremity Angiography;  Surgeon: Algernon Huxley, MD;  Location: [place] [facility];  Service:               Cardiovascular;  Laterality: Left; 02/21/2022: RIGHT/LEFT HEART CATH AND CORONARY ANGIOGRAPHY; N/A     Comment:  Procedure: RIGHT/LEFT HEART CATH AND CORONARY               ANGIOGRAPHY;  Surgeon: Nelva Bush, MD;  Location:               [place] [facility];  Service: Cardiovascular;                Laterality: N/A; BMI    Body Mass Index: 33.12 kg/m     Reproductive/Obstetrics negative OB ROS                             Anesthesia Physical Anesthesia Plan  ASA: 4  Anesthesia Plan: General   Post-op Pain  Management:    Induction:   PONV Risk Score and Plan:   Airway Management Planned:   Additional Equipment:   Intra-op Plan:   Post-operative Plan:   Informed Consent: I have reviewed the patients History and Physical, chart, labs and discussed the procedure including the risks, benefits and alternatives for the proposed anesthesia with the patient or authorized representative who has indicated his/her understanding and acceptance.     Dental Advisory Given  Plan Discussed with: CRNA  Anesthesia Plan Comments:         Anesthesia Quick Evaluation

## 2022-06-14 NOTE — Transfer of Care (Signed)
Immediate Anesthesia Transfer of Care Note  Patient: Lawrence Murray  Procedure(s) Performed: COLONOSCOPY WITH PROPOFOL  Patient Location: PACU and Endoscopy Unit  Anesthesia Type:General  Level of Consciousness: drowsy  Airway & Oxygen Therapy: Patient Spontanous Breathing  Post-op Assessment: Report given to RN and Post -op Vital signs reviewed and stable  Post vital signs: Reviewed and stable  Last Vitals:  Vitals Value Taken Time  BP 99/72 06/14/22 1013  Temp    Pulse 59 06/14/22 1013  Resp 18 06/14/22 1013  SpO2 99 % 06/14/22 1013  Vitals shown include unvalidated device data.  Last Pain:  Vitals:   06/14/22 0855  TempSrc: Temporal  PainSc: 0-No pain         Complications: No notable events documented.

## 2022-06-14 NOTE — Anesthesia Postprocedure Evaluation (Signed)
Anesthesia Post Note  Patient: Jamason Peckham  Procedure(s) Performed: COLONOSCOPY WITH PROPOFOL  Patient location during evaluation: PACU Anesthesia Type: General Level of consciousness: awake and alert Pain management: pain level controlled Vital Signs Assessment: post-procedure vital signs reviewed and stable Respiratory status: spontaneous breathing, nonlabored ventilation, respiratory function stable and patient connected to nasal cannula oxygen Cardiovascular status: blood pressure returned to baseline and stable Postop Assessment: no apparent nausea or vomiting Anesthetic complications: no   No notable events documented.   Last Vitals:  Vitals:   06/14/22 1029 06/14/22 1041  BP: 101/69 112/74  Pulse:    Resp:    Temp:    SpO2:      Last Pain:  Vitals:   06/14/22 1041  TempSrc:   PainSc: 0-No pain                 Molli Barrows

## 2022-06-15 ENCOUNTER — Encounter: Payer: Self-pay | Admitting: Gastroenterology

## 2022-06-15 LAB — SURGICAL PATHOLOGY

## 2022-06-18 ENCOUNTER — Encounter: Payer: Medicaid Other | Admitting: *Deleted

## 2022-06-18 ENCOUNTER — Other Ambulatory Visit: Payer: Self-pay | Admitting: Medical

## 2022-06-18 ENCOUNTER — Encounter: Payer: Self-pay | Admitting: Gastroenterology

## 2022-06-18 DIAGNOSIS — I5042 Chronic combined systolic (congestive) and diastolic (congestive) heart failure: Secondary | ICD-10-CM

## 2022-06-18 DIAGNOSIS — I5040 Unspecified combined systolic (congestive) and diastolic (congestive) heart failure: Secondary | ICD-10-CM | POA: Diagnosis not present

## 2022-06-18 NOTE — Progress Notes (Signed)
Daily Session Note  Patient Details  Name: Lawrence Murray MRN: [id number] Date of Birth: 08/18/1966 Referring Provider:   Flowsheet Row Cardiac Rehab from 05/30/2022 in [facility] Cardiac and Pulmonary Rehab  Referring Provider Ernestina Penna MD       Encounter Date: 06/18/2022  Check In:  Session Check In - 06/18/22 1609       Check-In   Supervising physician immediately available to respond to emergencies See telemetry face sheet for immediately available ER MD    Location [facility]-Cardiac & Pulmonary Rehab    Staff Present Justin Mend, RCP,RRT,BSRT;Solyana Nonaka Sherryll Burger, RN BSN;Noah Tickle, BS, Exercise Physiologist    Virtual Visit No    Medication changes reported     No    Fall or balance concerns reported    No    Warm-up and Cool-down Performed on first and last piece of equipment    Resistance Training Performed Yes    VAD Patient? No    PAD/SET Patient? No      Pain Assessment   Currently in Pain? No/denies                Social History   Tobacco Use  Smoking Status Former   Packs/day: 1.00   Years: 23.00   Total pack years: 23.00   Types: Cigarettes   Quit date: 02/04/2022   Years since quitting: 0.3  Smokeless Tobacco Never    Goals Met:  Independence with exercise equipment Exercise tolerated well No report of concerns or symptoms today Strength training completed today  Goals Unmet:  Not Applicable  Comments: Pt able to follow exercise prescription today without complaint.  Will continue to monitor for progression.    Dr. Emily Filbert is Medical Director for [place].  Dr. Ottie Glazier is Medical Director for [facility].

## 2022-06-19 ENCOUNTER — Other Ambulatory Visit: Payer: Self-pay

## 2022-06-19 ENCOUNTER — Other Ambulatory Visit: Payer: Self-pay | Admitting: Medical

## 2022-06-19 ENCOUNTER — Other Ambulatory Visit: Payer: Self-pay | Admitting: *Deleted

## 2022-06-19 DIAGNOSIS — Z122 Encounter for screening for malignant neoplasm of respiratory organs: Secondary | ICD-10-CM

## 2022-06-20 ENCOUNTER — Other Ambulatory Visit: Payer: Self-pay

## 2022-06-20 ENCOUNTER — Telehealth: Payer: Self-pay | Admitting: Acute Care

## 2022-06-20 ENCOUNTER — Encounter: Payer: Self-pay | Admitting: *Deleted

## 2022-06-20 ENCOUNTER — Encounter: Payer: Self-pay | Admitting: Nurse Practitioner

## 2022-06-20 ENCOUNTER — Encounter: Payer: Medicaid Other | Attending: Cardiology | Admitting: *Deleted

## 2022-06-20 DIAGNOSIS — I5043 Acute on chronic combined systolic (congestive) and diastolic (congestive) heart failure: Secondary | ICD-10-CM | POA: Insufficient documentation

## 2022-06-20 DIAGNOSIS — I5042 Chronic combined systolic (congestive) and diastolic (congestive) heart failure: Secondary | ICD-10-CM

## 2022-06-20 DIAGNOSIS — Z122 Encounter for screening for malignant neoplasm of respiratory organs: Secondary | ICD-10-CM

## 2022-06-20 DIAGNOSIS — Z87891 Personal history of nicotine dependence: Secondary | ICD-10-CM

## 2022-06-20 DIAGNOSIS — E119 Type 2 diabetes mellitus without complications: Secondary | ICD-10-CM | POA: Insufficient documentation

## 2022-06-20 MED FILL — Metoprolol Succinate Tab ER 24HR 25 MG (Tartrate Equiv): ORAL | 60 days supply | Qty: 30 | Fill #0 | Status: AC

## 2022-06-20 NOTE — Telephone Encounter (Signed)
Patient has been scheduled for LCS sdmv and LDCT

## 2022-06-20 NOTE — Assessment & Plan Note (Signed)
Advised patient to keep the feet elevated. Wrap the lower extremity using elastic bandages.

## 2022-06-20 NOTE — Assessment & Plan Note (Signed)
Continue metformin 500 mg twice daily. Advised pt to check the BS regularly, make a log and bring to next appointment.  Advised pt to monitor diet. Advised pt to eat variety of food including fruits, vegetables, whole grains, complex carbohydrates and proteins.

## 2022-06-20 NOTE — Progress Notes (Signed)
Daily Session Note  Patient Details  Name: Lawrence Murray MRN: [id number] Date of Birth: Jun 11, 1966 Referring Provider:   Flowsheet Row Cardiac Rehab from 05/30/2022 in [facility] Cardiac and Pulmonary Rehab  Referring Provider Ernestina Penna MD       Encounter Date: 06/20/2022  Check In:  Session Check In - 06/20/22 1544       Check-In   Supervising physician immediately available to respond to emergencies See telemetry face sheet for immediately available ER MD    Location [facility]-Cardiac & Pulmonary Rehab    Staff Present Justin Mend, RCP,RRT,BSRT;Abubakr Wieman Sherryll Burger, RN Odelia Gage, RN, ADN    Virtual Visit No    Medication changes reported     No    Fall or balance concerns reported    No    Warm-up and Cool-down Performed on first and last piece of equipment    Resistance Training Performed Yes    VAD Patient? No    PAD/SET Patient? No      Pain Assessment   Currently in Pain? No/denies                Social History   Tobacco Use  Smoking Status Former   Packs/day: 1.00   Years: 23.00   Total pack years: 23.00   Types: Cigarettes   Quit date: 02/04/2022   Years since quitting: 0.3  Smokeless Tobacco Never    Goals Met:  Independence with exercise equipment Exercise tolerated well No report of concerns or symptoms today Strength training completed today  Goals Unmet:  Not Applicable  Comments: Pt able to follow exercise prescription today without complaint.  Will continue to monitor for progression.    Dr. Emily Filbert is Medical Director for [place].  Dr. Ottie Glazier is Medical Director for [facility].

## 2022-06-20 NOTE — Progress Notes (Signed)
Cardiac Individual Treatment Plan  Patient Details  Name: Judy Goodenow MRN: [id number] Date of Birth: 12-Jul-1966 Referring Provider:   Flowsheet Row Cardiac Rehab from 05/30/2022 in [facility] Cardiac and Pulmonary Rehab  Referring Provider Ernestina Penna MD       Initial Encounter Date:  Flowsheet Row Cardiac Rehab from 05/30/2022 in [facility] Cardiac and Pulmonary Rehab  Date 05/30/22       Visit Diagnosis: Chronic combined systolic and diastolic heart failure ([place])  Patient's Home Medications on Admission:  Current Outpatient Medications:    Accu-Chek Softclix Lancets lancets, Use as directed to check blood glucose daily, Disp: 100 each, Rfl: 0   acetaminophen (TYLENOL) 500 MG tablet, Take 2 tablets (1,000 mg total) by mouth every 6 (six) hours as needed for mild pain, moderate pain, fever or headache., Disp: 30 tablet, Rfl: 0   ALPRAZolam (XANAX) 1 MG tablet, Take 0.5 tablets (0.5 mg total) by mouth 3 (three) times daily as needed for anxiety., Disp: 90 tablet, Rfl: 0   aspirin EC 81 MG tablet, Take 1 tablet (81 mg total) by mouth daily. Swallow whole., Disp: 30 tablet, Rfl: 12   atorvastatin (LIPITOR) 80 MG tablet, Take 1 tablet (80 mg total) by mouth daily., Disp: 90 tablet, Rfl: 0   Blood Glucose Monitoring Suppl (ACCU-CHEK GUIDE ME) w/Device KIT, Use as directed daily, Disp: 1 kit, Rfl: 0   dapagliflozin propanediol (FARXIGA) 10 MG TABS tablet, Take one tablet by mouth daily, Disp: 90 tablet, Rfl: 3   digoxin (LANOXIN) 0.125 MG tablet, Take 1 tablet (0.125 mg total) by mouth daily., Disp: 90 tablet, Rfl: 0   docusate sodium (STOOL SOFTENER) 250 MG capsule, Take 250 mg by mouth daily., Disp: , Rfl:    furosemide (LASIX) 40 MG tablet, Take 1.5 tablets (60 mg total) by mouth daily., Disp: 270 tablet, Rfl: 0   gabapentin (NEURONTIN) 100 MG capsule, Take 1 capsule (100 mg total) by mouth 3 (three) times daily., Disp: 60 capsule, Rfl: 1   glucose blood test strip, Use as directed once  daily, Disp: 100 each, Rfl: 0   metFORMIN (GLUCOPHAGE) 500 MG tablet, Take 1 tablet (500 mg total) by mouth 2 (two) times daily., Disp: 180 tablet, Rfl: 3   metoprolol succinate (TOPROL-XL) 25 MG 24 hr tablet, Take 0.5 tablets (12.5 mg total) by mouth daily., Disp: 30 tablet, Rfl: 0   pantoprazole (PROTONIX) 40 MG tablet, Take 1 tablet (40 mg total) by mouth daily., Disp: 30 tablet, Rfl: 11   potassium chloride SA (KLOR-CON M) 20 MEQ tablet, Take 1 tablet (20 mEq total) by mouth daily., Disp: 30 tablet, Rfl: 11   spironolactone (ALDACTONE) 25 MG tablet, Take 0.5 tablets (12.5 mg total) by mouth daily., Disp: 30 tablet, Rfl: 11   Zoster Vaccine Adjuvanted (SHINGRIX) injection, Inject into the muscle., Disp: 0.5 mL, Rfl: 1  Past Medical History: Past Medical History:  Diagnosis Date   CHF (congestive heart failure) (HCC)    Coronary artery disease    Diabetes mellitus without complication ([place])    Hypertension     Tobacco Use: Social History   Tobacco Use  Smoking Status Former   Packs/day: 1.00   Years: 23.00   Total pack years: 23.00   Types: Cigarettes   Quit date: 02/04/2022   Years since quitting: 0.3  Smokeless Tobacco Never    Labs: Review Flowsheet       Latest Ref Rng & Units 02/05/2022 02/09/2022 02/24/2022  Labs for ITP Cardiac and Pulmonary Rehab  Cholestrol 0 - 200 mg/dL - - 242   LDL (calc) 0 - 99 mg/dL - - 160   HDL-C >40 mg/dL - - 35   Trlycerides <150 mg/dL - - 235   Hemoglobin A1c 4.8 - 5.6 % 7.7  - -  PH, Arterial 7.35 - 7.45 - 7.52  -  PCO2 arterial 32 - 48 mmHg - 47  -  Bicarbonate 20.0 - 28.0 mmol/L - 38.4  -  O2 Saturation % - 97.6  -     Exercise Target Goals: Exercise Program Goal: Individual exercise prescription set using results from initial 6 min walk test and THRR while considering  patient's activity barriers and safety.   Exercise Prescription Goal: Initial exercise prescription builds to 30-45 minutes a day of aerobic activity, 2-3 days  per week.  Home exercise guidelines will be given to patient during program as part of exercise prescription that the participant will acknowledge.   Education: Aerobic Exercise: - Group verbal and visual presentation on the components of exercise prescription. Introduces F.I.T.T principle from ACSM for exercise prescriptions.  Reviews F.I.T.T. principles of aerobic exercise including progression. Written material given at graduation.   Education: Resistance Exercise: - Group verbal and visual presentation on the components of exercise prescription. Introduces F.I.T.T principle from ACSM for exercise prescriptions  Reviews F.I.T.T. principles of resistance exercise including progression. Written material given at graduation.    Education: Exercise & Equipment Safety: - Individual verbal instruction and demonstration of equipment use and safety with use of the equipment. Flowsheet Row Cardiac Rehab from 06/06/2022 in [facility] Cardiac and Pulmonary Rehab  Date 05/21/22  Educator [facility]  Instruction Review Code 1- Verbalizes Understanding       Education: Exercise Physiology & General Exercise Guidelines: - Group verbal and written instruction with models to review the exercise physiology of the cardiovascular system and associated critical values. Provides general exercise guidelines with specific guidelines to those with heart or lung disease.    Education: Flexibility, Balance, Mind/Body Relaxation: - Group verbal and visual presentation with interactive activity on the components of exercise prescription. Introduces F.I.T.T principle from ACSM for exercise prescriptions. Reviews F.I.T.T. principles of flexibility and balance exercise training including progression. Also discusses the mind body connection.  Reviews various relaxation techniques to help reduce and manage stress (i.e. Deep breathing, progressive muscle relaxation, and visualization). Balance handout provided to take home. Written  material given at graduation.   Activity Barriers & Risk Stratification:  Activity Barriers & Cardiac Risk Stratification - 05/30/22 1531       Activity Barriers & Cardiac Risk Stratification   Activity Barriers Joint Problems;Deconditioning;Muscular Weakness;Shortness of Breath;Other (comment)    Comments Neuropathy, Edema, PAD    Cardiac Risk Stratification High             6 Minute Walk:  6 Minute Walk     Row Name 05/30/22 1529         6 Minute Walk   Phase Initial     Distance 1075 feet     Walk Time 6 minutes     # of Rest Breaks 0     MPH 2.04     METS 2.86     RPE 13     Perceived Dyspnea  3     VO2 Peak 10     Symptoms Yes (comment)     Comments SOB, hip, knee, foot pain/numbness     Resting HR 70 bpm     Resting BP 100/52  Resting Oxygen Saturation  99 %     Exercise Oxygen Saturation  during 6 min walk 100 %     Max Ex. HR 98 bpm     Max Ex. BP 114/64     2 Minute Post BP 106/58              Oxygen Initial Assessment:   Oxygen Re-Evaluation:   Oxygen Discharge (Final Oxygen Re-Evaluation):   Initial Exercise Prescription:  Initial Exercise Prescription - 05/30/22 1500       Date of Initial Exercise RX and Referring Provider   Date 05/30/22    Referring Provider Ernestina Penna MD      Oxygen   Maintain Oxygen Saturation 88% or higher      Treadmill   MPH 2    Grade 0    Minutes 15    METs 2.53      REL-XR   Level 1    Speed 50    Minutes 15    METs 2.86      T5 Nustep   Level 1    SPM 80    Minutes 15    METs 2.86      Prescription Details   Frequency (times per week) 3    Duration Progress to 30 minutes of continuous aerobic without signs/symptoms of physical distress      Intensity   THRR 40-80% of Max Heartrate 108-146    Ratings of Perceived Exertion 11-13    Perceived Dyspnea 0-4      Progression   Progression Continue to progress workloads to maintain intensity without signs/symptoms of physical  distress.      Resistance Training   Training Prescription Yes    Weight 4 lb    Reps 10-15             Perform Capillary Blood Glucose checks as needed.  Exercise Prescription Changes:   Exercise Prescription Changes     Row Name 05/30/22 1500 06/19/22 1500           Response to Exercise   Blood Pressure (Admit) 100/52 110/64      Blood Pressure (Exercise) 114/64 118/72      Blood Pressure (Exit) 106/58 108/62      Heart Rate (Admit) 70 bpm 74 bpm      Heart Rate (Exercise) 98 bpm 137 bpm      Heart Rate (Exit) 69 bpm 99 bpm      Oxygen Saturation (Admit) 99 % --      Oxygen Saturation (Exercise) 100 % --      Rating of Perceived Exertion (Exercise) 13 15      Perceived Dyspnea (Exercise) 3 --      Symptoms SOB, Hip, knee, feet pain/tightness --      Comments 6MWT Results 5th full day of exercise      Duration -- Continue with 30 min of aerobic exercise without signs/symptoms of physical distress.      Intensity -- THRR unchanged        Progression   Progression -- Continue to progress workloads to maintain intensity without signs/symptoms of physical distress.      Average METs -- 2.81        Resistance Training   Training Prescription -- Yes      Weight -- 4 lb      Reps -- 10-15        Interval Training   Interval Training -- No  Treadmill   MPH -- 2      Grade -- 0.5      Minutes -- 15      METs -- 2.67        REL-XR   Level -- 2      Minutes -- 15      METs -- 3.5        T5 Nustep   Level -- 1      Minutes -- 15      METs -- 2.77        Oxygen   Maintain Oxygen Saturation -- 88% or higher               Exercise Comments:   Exercise Comments     Row Name 05/31/22 1534           Exercise Comments First full day of exercise!  Patient was oriented to gym and equipment including functions, settings, policies, and procedures.  Patient's individual exercise prescription and treatment plan were reviewed.  All starting workloads  were established based on the results of the 6 minute walk test done at initial orientation visit.  The plan for exercise progression was also introduced and progression will be customized based on patient's performance and goals.                Exercise Goals and Review:   Exercise Goals     Row Name 05/30/22 1546             Exercise Goals   Increase Physical Activity Yes       Intervention Develop an individualized exercise prescription for aerobic and resistive training based on initial evaluation findings, risk stratification, comorbidities and participant's personal goals.;Provide advice, education, support and counseling about physical activity/exercise needs.       Expected Outcomes Short Term: Attend rehab on a regular basis to increase amount of physical activity.;Long Term: Add in home exercise to make exercise part of routine and to increase amount of physical activity.;Long Term: Exercising regularly at least 3-5 days a week.       Increase Strength and Stamina Yes       Intervention Provide advice, education, support and counseling about physical activity/exercise needs.;Develop an individualized exercise prescription for aerobic and resistive training based on initial evaluation findings, risk stratification, comorbidities and participant's personal goals.       Expected Outcomes Short Term: Increase workloads from initial exercise prescription for resistance, speed, and METs.;Short Term: Perform resistance training exercises routinely during rehab and add in resistance training at home;Long Term: Improve cardiorespiratory fitness, muscular endurance and strength as measured by increased METs and functional capacity (6MWT)       Able to understand and use rate of perceived exertion (RPE) scale Yes       Intervention Provide education and explanation on how to use RPE scale       Expected Outcomes Short Term: Able to use RPE daily in rehab to express subjective intensity  level;Long Term:  Able to use RPE to guide intensity level when exercising independently       Able to understand and use Dyspnea scale Yes       Intervention Provide education and explanation on how to use Dyspnea scale       Expected Outcomes Long Term: Able to use Dyspnea scale to guide intensity level when exercising independently;Short Term: Able to use Dyspnea scale daily in rehab to express subjective sense of shortness of breath during exertion  Knowledge and understanding of Target Heart Rate Range (THRR) Yes       Intervention Provide education and explanation of THRR including how the numbers were predicted and where they are located for reference       Expected Outcomes Long Term: Able to use THRR to govern intensity when exercising independently;Short Term: Able to state/look up THRR;Short Term: Able to use daily as guideline for intensity in rehab       Able to check pulse independently Yes       Intervention Review the importance of being able to check your own pulse for safety during independent exercise;Provide education and demonstration on how to check pulse in carotid and radial arteries.       Expected Outcomes Short Term: Able to explain why pulse checking is important during independent exercise;Long Term: Able to check pulse independently and accurately       Understanding of Exercise Prescription Yes       Intervention Provide education, explanation, and written materials on patient's individual exercise prescription       Expected Outcomes Short Term: Able to explain program exercise prescription;Long Term: Able to explain home exercise prescription to exercise independently                Exercise Goals Re-Evaluation :  Exercise Goals Re-Evaluation     Row Name 05/31/22 1534 06/19/22 1521           Exercise Goal Re-Evaluation   Exercise Goals Review Able to understand and use rate of perceived exertion (RPE) scale;Able to understand and use Dyspnea  scale;Knowledge and understanding of Target Heart Rate Range (THRR);Understanding of Exercise Prescription Increase Physical Activity;Increase Strength and Stamina;Understanding of Exercise Prescription      Comments Reviewed RPE scale, THR and program prescription with pt today.  Pt voiced understanding and was given a copy of goals to take home. [place] is doing well for the first couple of weeks that he has been here. He has been able to follow his initial exercise prescription well, and even able to add a 0.5% incline to his treadmill workload. He also increased to level 2 on the XR. We will continue to monitor as he progresses in our program.      Expected Outcomes Short: Use RPE daily to regulate intensity.  Long: Follow program prescription in THR. Short: Continue to increase workload on treadmill Long: Continue to improve overall strength and stamina               Discharge Exercise Prescription (Final Exercise Prescription Changes):  Exercise Prescription Changes - 06/19/22 1500       Response to Exercise   Blood Pressure (Admit) 110/64    Blood Pressure (Exercise) 118/72    Blood Pressure (Exit) 108/62    Heart Rate (Admit) 74 bpm    Heart Rate (Exercise) 137 bpm    Heart Rate (Exit) 99 bpm    Rating of Perceived Exertion (Exercise) 15    Comments 5th full day of exercise    Duration Continue with 30 min of aerobic exercise without signs/symptoms of physical distress.    Intensity THRR unchanged      Progression   Progression Continue to progress workloads to maintain intensity without signs/symptoms of physical distress.    Average METs 2.81      Resistance Training   Training Prescription Yes    Weight 4 lb    Reps 10-15      Interval Training   Interval Training  No      Treadmill   MPH 2    Grade 0.5    Minutes 15    METs 2.67      REL-XR   Level 2    Minutes 15    METs 3.5      T5 Nustep   Level 1    Minutes 15    METs 2.77      Oxygen   Maintain  Oxygen Saturation 88% or higher             Nutrition:  Target Goals: Understanding of nutrition guidelines, daily intake of sodium <1518m, cholesterol <2016m calories 30% from fat and 7% or less from saturated fats, daily to have 5 or more servings of fruits and vegetables.  Education: All About Nutrition: -Group instruction provided by verbal, written material, interactive activities, discussions, models, and posters to present general guidelines for heart healthy nutrition including fat, fiber, MyPlate, the role of sodium in heart healthy nutrition, utilization of the nutrition label, and utilization of this knowledge for meal planning. Follow up email sent as well. Written material given at graduation. Flow[facility]  Education need identified 05/30/22       Biometrics:  Pre Biometrics - 05/30/22 1547       Pre Biometrics   Height 6' 0.25" (1.835 m)    Weight 254 lb 14.4 oz (115.6 kg)    Waist Circumference 59.5 inches    Hip Circumference 43 inches    Waist to Hip Ratio 1.38 %    BMI (Calculated) 34.34    Single Leg Stand 26.2 seconds   Right             Nutrition Therapy Plan and Nutrition Goals:  Nutrition Therapy & Goals - 05/30/22 1649       Personal Nutrition Goals   Comments [place] feeling overwhelmed with his hospitalization this summer, quitting smoking, 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 [place] quitting smoking cold [place] hard, but he was not interested in interventions such as the patch due to potential side effects. He reports this was easier in the hospital due to less temptation and now has difficulty on a regular basis; after eating he will want to smoke, but instead will continue eating to curb this. [place] he tried to take care of himself by  being an avid onEngineer, manufacturingn various subjects such as nutrition in addition to meeting with an RD at the hospital as well as in the outpatient setting; he feels he knows how to be healthy, it is just a matter of doing it. This RD briefly reviewed some tactics to save on time and money while trying to eat healthy. [place] sleeping during the morning hours as his son is at school and his father has an aide to help him so he is able to relax and sleep; he does not end up going to sleep until around 5-7am and will sleep until about 2pm. When he takes his medication he tries to have something in his stomach like a banana. He has his biggest meal in the evening around 7pm where he will have some protein such as pork chops, steak, or chicken with some vegetables (he does not care for frozen and will try to buy many  fresh) and potentially a carbohydrate rich food like rice or starchy vegetables; he reports that this meal may be consisdered to be multiple portions by other people - reviewed meal structure such as being mindful of CHO quantity in one sitting. He reports not using fat such as oil or butter with cooking, not eating fried foods, and trying to eat balanced meals using a Sanjuana Letters. Snacks will vary with choices such as fruit, ice cream, cookies, and vanilla waffers. He reports drinking water throughout the day. Encouraged Kenner to attend the educational sessions as he is able to and pointed out the nutrition education was in November. Jhalen reports that his preference is to not receive physical copies of handouts, provided him with youtube channel and sent email with informational handouts on nutrition. Since meeting with this RD he reported not going to outpatient RD, but he still RD information so that he can contact them when needed especially post cardiac rehab.             Nutrition Assessments:  MEDIFICTS Score Key: ?70 Need to make dietary changes  40-70 Heart Healthy  Diet ? 40 Therapeutic Level Cholesterol Diet  Flowsheet Row Cardiac Rehab from 05/30/2022 in [facility] Cardiac and Pulmonary Rehab  Picture Your Plate Total Score on Admission 55      Picture Your Plate Scores: <79 Unhealthy dietary pattern with much room for improvement. 41-50 Dietary pattern unlikely to meet recommendations for good health and room for improvement. 51-60 More healthful dietary pattern, with some room for improvement.  >60 Healthy dietary pattern, although there may be some specific behaviors that could be improved.    Nutrition Goals Re-Evaluation:   Nutrition Goals Discharge (Final Nutrition Goals Re-Evaluation):   Psychosocial: Target Goals: Acknowledge presence or absence of significant depression and/or stress, maximize coping skills, provide positive support system. Participant is able to verbalize types and ability to use techniques and skills needed for reducing stress and depression.   Education: Stress, Anxiety, and Depression - Group verbal and visual presentation to define topics covered.  Reviews how body is impacted by stress, anxiety, and depression.  Also discusses healthy ways to reduce stress and to treat/manage anxiety and depression.  Written material given at graduation.   Education: Sleep Hygiene -Provides group verbal and written instruction about how sleep can affect your health.  Define sleep hygiene, discuss sleep cycles and impact of sleep habits. Review good sleep hygiene tips.    Initial Review & Psychosocial Screening:  Initial Psych Review & Screening - 05/21/22 1119       Initial Review   Current issues with Current Sleep Concerns;Current Psychotropic Meds;History of Depression;Current Depression;Current Anxiety/Panic;Current Stress Concerns    Source of Stress Concerns Chronic Illness;Family    Comments Makell is taking care of his dad routnely and lost track of his own health. He was in and out of court for custody and child support  in the past. He has not seen a doctor since the [place] and was the last time he had insurance. His dad and son live with him. They are all living off of his fathers social securtiy. He states taking care of his father has drained him financially, phyically and mentaly.      Family Dynamics   Good Support System? No    Strains Intra-family strains;Illness and family care strain      Barriers   Psychosocial barriers to participate in program The patient should benefit from training in stress management and  relaxation.      Screening Interventions   Interventions Encouraged to exercise;To provide support and resources with identified psychosocial needs;Provide feedback about the scores to participant    Expected Outcomes Short Term goal: Utilizing psychosocial counselor, staff and physician to assist with identification of specific Stressors or current issues interfering with healing process. Setting desired goal for each stressor or current issue identified.;Long Term Goal: Stressors or current issues are controlled or eliminated.;Short Term goal: Identification and review with participant of any Quality of Life or Depression concerns found by scoring the questionnaire.;Long Term goal: The participant improves quality of Life and PHQ9 Scores as seen by post scores and/or verbalization of changes             Quality of Life Scores:   Quality of Life - 05/30/22 1549       Quality of Life   Select Quality of Life      Quality of Life Scores   Health/Function Pre 11.1 %    Socioeconomic Pre 15.5 %    Psych/Spiritual Pre 7.29 %    Family Pre 10.2 %    GLOBAL Pre 11.21 %            Scores of 19 and below usually indicate a poorer quality of life in these areas.  A difference of  2-3 points is a clinically meaningful difference.  A difference of 2-3 points in the total score of the Quality of Life Index has been associated with significant improvement in overall quality of life,  self-image, physical symptoms, and general health in studies assessing change in quality of life.  PHQ-9: Review Flowsheet       05/30/2022 05/11/2022 05/03/2022  Depression screen PHQ 2/9  Decreased Interest 2 0 0  Down, Depressed, Hopeless 2 3 0  PHQ - 2 Score 4 3 0  Altered sleeping 2 3 -  Tired, decreased energy 2 3 -  Change in appetite 2 3 -  Feeling bad or failure about yourself  3 3 -  Trouble concentrating 1 3 -  Moving slowly or fidgety/restless 2 1 -  Suicidal thoughts 0 0 -  PHQ-9 Score 16 19 -  Difficult doing work/chores Very difficult Extremely dIfficult -   Interpretation of Total Score  Total Score Depression Severity:  1-4 = Minimal depression, 5-9 = Mild depression, 10-14 = Moderate depression, 15-19 = Moderately severe depression, 20-27 = Severe depression   Psychosocial Evaluation and Intervention:  Psychosocial Evaluation - 05/21/22 1124       Psychosocial Evaluation & Interventions   Interventions Encouraged to exercise with the program and follow exercise prescription;Stress management education;Relaxation education    Comments Dejean is taking care of his dad routnely and lost track of his own health. He was in and out of court for custody and child support in the past. He has not seen a doctor since the [place] and was the last time he had insurance. His dad and son live with him. They are all living off of his fathers social securtiy. He states taking care of his father has drained him financially, phyically and mentaly.    Expected Outcomes Short: Start [facility] to help with mood. Long: Maintain a healthy mental state    Continue Psychosocial Services  Follow up required by staff             Psychosocial Re-Evaluation:   Psychosocial Discharge (Final Psychosocial Re-Evaluation):   Vocational Rehabilitation: Provide vocational rehab assistance to qualifying candidates.  Vocational Rehab Evaluation & Intervention:   Education: Education  Goals: Education classes will be provided on a variety of topics geared toward better understanding of heart health and risk factor modification. Participant will state understanding/return demonstration of topics presented as noted by education test scores.  Learning Barriers/Preferences:  Learning Barriers/Preferences - 05/21/22 1115       Learning Barriers/Preferences   Learning Barriers None    Learning Preferences None             General Cardiac Education Topics:  AED/CPR: - Group verbal and written instruction with the use of models to demonstrate the basic use of the AED with the basic ABC's of resuscitation.   Anatomy and Cardiac Procedures: - Group verbal and visual presentation and models provide information about basic cardiac anatomy and function. Reviews the testing methods done to diagnose heart disease and the outcomes of the test results. Describes the treatment choices: Medical Management, Angioplasty, or Coronary Bypass Surgery for treating various heart conditions including Myocardial Infarction, Angina, Valve Disease, and Cardiac Arrhythmias.  Written material given at graduation. Flowsheet Row Cardiac Rehab from 06/06/2022 in [facility] Cardiac and Pulmonary Rehab  Education need identified 05/30/22       Medication Safety: - Group verbal and visual instruction to review commonly prescribed medications for heart and lung disease. Reviews the medication, class of the drug, and side effects. Includes the steps to properly store meds and maintain the prescription regimen.  Written material given at graduation.   Intimacy: - Group verbal instruction through game format to discuss how heart and lung disease can affect sexual intimacy. Written material given at graduation..   Know Your Numbers and Heart Failure: - Group verbal and visual instruction to discuss disease risk factors for cardiac and pulmonary disease and treatment options.  Reviews associated critical  values for Overweight/Obesity, Hypertension, Cholesterol, and Diabetes.  Discusses basics of heart failure: signs/symptoms and treatments.  Introduces Heart Failure Zone chart for action plan for heart failure.  Written material given at graduation.   Infection Prevention: - Provides verbal and written material to individual with discussion of infection control including proper hand washing and proper equipment cleaning during exercise session. Flowsheet Row Cardiac Rehab from 06/06/2022 in [facility] Cardiac and Pulmonary Rehab  Date 05/21/22  Educator [facility]  Instruction Review Code 1- Verbalizes Understanding       Falls Prevention: - Provides verbal and written material to individual with discussion of falls prevention and safety. Flowsheet Row Cardiac Rehab from 06/06/2022 in [facility] Cardiac and Pulmonary Rehab  Date 05/21/22  Educator [facility]  Instruction Review Code 1- Verbalizes Understanding       Other: -Provides group and verbal instruction on various topics (see comments)   Knowledge Questionnaire Score:  Knowledge Questionnaire Score - 05/30/22 1558       Knowledge Questionnaire Score   Pre Score 23/26             Core Components/Risk Factors/Patient Goals at Admission:  Personal Goals and Risk Factors at Admission - 05/30/22 1558       Core Components/Risk Factors/Patient Goals on Admission    Weight Management Yes;Weight Loss    Intervention Weight Management: Develop a combined nutrition and exercise program designed to reach desired caloric intake, while maintaining appropriate intake of nutrient and fiber, sodium and fats, and appropriate energy expenditure required for the weight goal.;Weight Management: Provide education and appropriate resources to help participant work on and attain dietary goals.;Weight Management/Obesity: Establish reasonable short term and long  term weight goals.    Admit Weight 254 lb 14.4 oz (115.6 kg)    Goal Weight: Short Term 250 lb (113.4  kg)    Goal Weight: Long Term 240 lb (108.9 kg)    Expected Outcomes Short Term: Continue to assess and modify interventions until short term weight is achieved;Long Term: Adherence to nutrition and physical activity/exercise program aimed toward attainment of established weight goal;Weight Loss: Understanding of general recommendations for a balanced deficit meal plan, which promotes 1-2 lb weight loss per week and includes a negative energy balance of [phone] kcal/d;Understanding recommendations for meals to include 15-35% energy as protein, 25-35% energy from fat, 35-60% energy from carbohydrates, less than 238m of dietary cholesterol, 20-35 gm of total fiber daily;Understanding of distribution of calorie intake throughout the day with the consumption of 4-5 meals/snacks    Tobacco Cessation Yes    Number of packs per day 0    Intervention Assist the participant in steps to quit. Provide individualized education and counseling about committing to Tobacco Cessation, relapse prevention, and pharmacological support that can be provided by physician.;OAdvice worker assist with locating and accessing local/national Quit Smoking programs, and support quit date choice.    Expected Outcomes Short Term: Will demonstrate readiness to quit, by selecting a quit date.;Short Term: Will quit all tobacco product use, adhering to prevention of relapse plan.;Long Term: Complete abstinence from all tobacco products for at least 12 months from quit date.    Diabetes Yes    Intervention Provide education about signs/symptoms and action to take for hypo/hyperglycemia.;Provide education about proper nutrition, including hydration, and aerobic/resistive exercise prescription along with prescribed medications to achieve blood glucose in normal ranges: Fasting glucose 65-99 mg/dL    Expected Outcomes Short Term: Participant verbalizes understanding of the signs/symptoms and immediate care of hyper/hypoglycemia,  proper foot care and importance of medication, aerobic/resistive exercise and nutrition plan for blood glucose control.;Long Term: Attainment of HbA1C < 7%.    Heart Failure Yes    Intervention Provide a combined exercise and nutrition program that is supplemented with education, support and counseling about heart failure. Directed toward relieving symptoms such as shortness of breath, decreased exercise tolerance, and extremity edema.    Expected Outcomes Improve functional capacity of life;Short term: Attendance in program 2-3 days a week with increased exercise capacity. Reported lower sodium intake. Reported increased fruit and vegetable intake. Reports medication compliance.;Short term: Daily weights obtained and reported for increase. Utilizing diuretic protocols set by physician.;Long term: Adoption of self-care skills and reduction of barriers for early signs and symptoms recognition and intervention leading to self-care maintenance.    Hypertension Yes    Intervention Provide education on lifestyle modifcations including regular physical activity/exercise, weight management, moderate sodium restriction and increased consumption of fresh fruit, vegetables, and low fat dairy, alcohol moderation, and smoking cessation.;Monitor prescription use compliance.    Expected Outcomes Short Term: Continued assessment and intervention until BP is < 140/964mHG in hypertensive participants. < 130/8014mG in hypertensive participants with diabetes, heart failure or chronic kidney disease.;Long Term: Maintenance of blood pressure at goal levels.    Lipids Yes    Intervention Provide education and support for participant on nutrition & aerobic/resistive exercise along with prescribed medications to achieve LDL <58m60mDL >40mg21m Expected Outcomes Long Term: Cholesterol controlled with medications as prescribed, with individualized exercise RX and with personalized nutrition plan. Value goals: LDL < 58mg,40m > 40  mg.;Short Term: Participant states understanding of desired  cholesterol values and is compliant with medications prescribed. Participant is following exercise prescription and nutrition guidelines.             Education:Diabetes - Individual verbal and written instruction to review signs/symptoms of diabetes, desired ranges of glucose level fasting, after meals and with exercise. Acknowledge that pre and post exercise glucose checks will be done for 3 sessions at entry of program. [place] from 06/06/2022 in [facility] Cardiac and Pulmonary Rehab  Date 05/21/22  Educator [facility]  Instruction Review Code 1- Verbalizes Understanding       Core Components/Risk Factors/Patient Goals Review:    Core Components/Risk Factors/Patient Goals at Discharge (Final Review):    ITP Comments:  ITP Comments     Row Name 05/21/22 1113 05/30/22 1529 05/31/22 1533 06/07/22 1641 06/20/22 0916   ITP Comments Virtual Visit completed. Patient informed on EP and RD appointment and 6 Minute walk test. Patient also informed of patient health questionnaires on My Chart. Patient Verbalizes understanding. Visit diagnosis can be found in [facility] 04/11/2022. Completed 6MWT and gym orientation. Initial ITP created and sent for review to Dr. Emily Filbert, {Medical Director. First full day of exercise!  Patient was oriented to gym and equipment including functions, settings, policies, and procedures.  Patient's individual exercise prescription and treatment plan were reviewed.  All starting workloads were established based on the results of the 6 minute walk test done at initial orientation visit.  The plan for exercise progression was also introduced and progression will be customized based on patient's performance and goals. patient states that he is hurting in his lungs and upper back and all around his upper body. He states it is not a soreness. Listened to patients lung sounds, all his anterior lobes have crackles  and ronchi. Posterior is more clear with crackles in the bases. He states that he is taking his lasix but is up 6 pounds since Monday. Informed him to call his doctor in the morning and watch his weight. 30 Day review completed. Medical Director ITP review done, changes made as directed, and signed approval by Medical Director. NEW TO PROGRAM            Comments:

## 2022-06-20 NOTE — Assessment & Plan Note (Signed)
Started him on gabapentin, discontinue Lyrica. Would refer him to veins and vascular.

## 2022-06-20 NOTE — Telephone Encounter (Signed)
Pt returned call. Saw where he had a referral for a lung cancer screening visit. Routing encounter to lung nodule pool for them to reach out to pt.

## 2022-06-21 ENCOUNTER — Encounter: Payer: Medicaid Other | Admitting: *Deleted

## 2022-06-21 ENCOUNTER — Other Ambulatory Visit: Payer: Self-pay

## 2022-06-21 ENCOUNTER — Other Ambulatory Visit: Payer: Self-pay | Admitting: *Deleted

## 2022-06-21 ENCOUNTER — Ambulatory Visit: Payer: Self-pay

## 2022-06-21 DIAGNOSIS — I5042 Chronic combined systolic (congestive) and diastolic (congestive) heart failure: Secondary | ICD-10-CM

## 2022-06-21 DIAGNOSIS — I5043 Acute on chronic combined systolic (congestive) and diastolic (congestive) heart failure: Secondary | ICD-10-CM | POA: Diagnosis not present

## 2022-06-21 NOTE — Patient Outreach (Signed)
Medicaid Managed Care   Nurse Care Manager Note  06/21/2022 Name:  Lawrence Murray MRN:  [id number] DOB:  05/02/1966  Lawrence Murray is an 56 y.o. year old male who is a primary patient of Lawrence Lo, NP.  The [facility] Managed Care Coordination team was consulted for assistance with:    CHF DMII  Lawrence Murray was given information about Medicaid Managed Care Coordination team services today. Lawrence Murray Patient agreed to services and verbal consent obtained.  Engaged with patient by telephone for initial visit in response to provider referral for case management and/or care coordination services.   Assessments/Interventions:  Review of past medical history, allergies, medications, health status, including review of consultants reports, laboratory and other test data, was performed as part of comprehensive evaluation and provision of chronic care management services.  SDOH (Social Determinants of Health) assessments and interventions performed: SDOH Interventions    Flowsheet Row Patient Outreach Telephone from 06/21/2022 in [place] Coordination Nutrition from 05/11/2022 in [place] Documentation from 03/09/2022 in [place]  SDOH Interventions     Food Insecurity Interventions -- -- Intervention Not Indicated  Housing Interventions Intervention Not Indicated -- Intervention Not Indicated  Transportation Interventions Intervention Not Indicated -- Intervention Not Indicated  Depression Interventions/Treatment  -- Medication --  Financial Strain Interventions -- -- Development worker, community  [Pending with [place]  No Known Allergies  Medications Reviewed Today     Reviewed by Lawrence Montane, RN (Registered Nurse) on 06/21/22 at 17  Med List Status: <None>   Medication Order Taking? Sig Documenting Provider Last Dose Status Informant  Accu-Chek Softclix Lancets  lancets [id number] Yes Use as directed to check blood glucose daily  Taking Active   acetaminophen (TYLENOL) 500 MG tablet [id number] Yes Take 2 tablets (1,000 mg total) by mouth every 6 (six) hours as needed for mild pain, moderate pain, fever or headache. Lawrence Reeve, DO Taking Active   ALPRAZolam Duanne Moron) 1 MG tablet [id number] Yes Take 0.5 tablets (0.5 mg total) by mouth 3 (three) times daily as needed for anxiety. Lawrence Lo, NP Taking Active   aspirin EC 81 MG tablet [id number] Yes Take 1 tablet (81 mg total) by mouth daily. Swallow whole. Lawrence Reeve, DO Taking Active   atorvastatin (LIPITOR) 80 MG tablet [id number] Yes Take 1 tablet (80 mg total) by mouth daily. Lawrence Murray, Lawrence Murray Taking Active   Blood Glucose Monitoring Suppl (ACCU-CHEK GUIDE ME) w/Device KIT [id number] Yes Use as directed daily  Taking Active   dapagliflozin propanediol (FARXIGA) 10 MG TABS tablet [id number] Yes Take one tablet by mouth daily  Taking Active   digoxin (LANOXIN) 0.125 MG tablet [id number] Yes Take 1 tablet (0.125 mg total) by mouth daily. Lawrence Murray Taking Active   docusate sodium (STOOL SOFTENER) 250 MG capsule [id number] Yes Take 250 mg by mouth daily. [provider] Taking Active   furosemide (LASIX) 40 MG tablet [id number] Yes Take 1.5 tablets (60 mg total) by mouth daily. Lawrence Murray Taking Active   gabapentin (NEURONTIN) 100 MG capsule [id number] Yes Take 1 capsule (100 mg total) by mouth 3 (three) times daily. Lawrence Lo, NP Taking Active   glucose blood test strip [id number] Yes Use as directed once daily  Taking Active   metFORMIN (GLUCOPHAGE) 500 MG tablet [id number] Yes Take 1 tablet (500 mg total) by mouth 2 (two) times daily. Lawrence Lo, NP  Taking Active   metoprolol succinate (TOPROL-XL) 25 MG 24 hr tablet [id number] Yes Take 0.5 tablets (12.5 mg total) by mouth daily. Please keep scheduled appointment for additional refills. Lawrence Sable, MD Taking Active   pantoprazole (PROTONIX) 40 MG tablet [id number] Yes Take 1 tablet (40 mg total) by mouth daily. Lawrence Sable, MD Taking Active   potassium chloride SA (KLOR-CON M) 20 MEQ tablet [id number] Yes Take 1 tablet (20 mEq total) by mouth daily. Lawrence Sable, MD Taking Active     Discontinued 05/31/22 1410 (Discontinued by provider)            Med Note>> Lawrence Murray, Lawrence Murray   05/31/2022  2:10 PM needs to get from pcp    spironolactone (ALDACTONE) 25 MG tablet [id number] Yes Take 0.5 tablets (12.5 mg total) by mouth daily. Lawrence Sable, MD Taking Active   Zoster Vaccine Adjuvanted [facility]) injection [id number]  Inject into the muscle. Lawrence Basques, MD  Active             Patient Active Problem List   Diagnosis Date Noted   Type 2 diabetes mellitus without complication, without long-term current use of insulin ([place]) 06/20/2022   Encounter for screening colonoscopy    Adenomatous polyp of colon    Peripheral neuropathy 05/20/2022   Lower extremity edema 05/20/2022   Anxiety 05/20/2022   Primary hypertension 05/03/2022   Chronic combined systolic and diastolic congestive heart failure ([place]) 05/03/2022   Coronary artery disease 02/21/2022   Acute HFrEF (heart failure with reduced ejection fraction) ([place])    PVD (peripheral vascular disease) ([place]) 02/12/2022   Type 2 diabetes mellitus with hyperglycemia ([place]) 02/07/2022   Smoker    Acute on chronic combined systolic and diastolic CHF (congestive heart failure) ([place]) 02/05/2022    Conditions to be addressed/monitored per PCP order:  CHF and DMII  Care Plan : RN Care Manager Plan of Care  Updates made by Lawrence Montane, RN since 06/21/2022 12:00 AM     Problem: Health Management needs related to CHF and DM      Long-Range Goal: Development of Plan of Care to address Health Management needs related to CHF and DM   Start Date: 06/21/2022  Expected End Date: 09/19/2022  Priority: High  Note:    Current Barriers:  Chronic Disease Management support and education needs related to CHF and DMII  RNCM Clinical Goal(s):  Patient will verbalize understanding of plan for management of CHF and DMII as evidenced by patient reports take all medications exactly as prescribed and will call provider for medication related questions as evidenced by patient reports    attend all scheduled medical appointments: multiple Cardiac Rehab visits, 06/29/22 with PCP, 07/31/22 with Pulmonology and 08/01/22 for Chest CT as evidenced by provider documentation I EMR        work with Education officer, museum to address Financial constraints related to limited/no income related to the management of CHF and DMII as evidenced by review of EMR and patient or Education officer, museum report     through collaboration with Consulting civil engineer, provider, and care team.   Interventions: Inter-disciplinary care team collaboration (see longitudinal plan of care) Evaluation of current treatment plan related to  self management and patient's adherence to plan as established by provider Provided therapeutic listening Advised patient to contact Medicaid Worker regarding bill received for copays while in the hospital Congratulated patient on smoking cessation   Heart Failure Interventions:  (Status: New goal.)  Long Term Goal  Wt  Readings from Last 3 Encounters:  06/14/22 251 lb (113.9 kg)  06/01/22 255 lb 4.8 oz (115.8 kg)  05/30/22 254 lb 14.4 oz (115.6 kg)   Advised patient to weigh each morning after emptying bladder Discussed importance of daily weight and advised patient to weigh and record daily Discussed the importance of keeping all appointments with provider Provided patient with education about the role of exercise in the management of heart failure Assessed social determinant of health barriers Referral to BSW for financial resources Advised patient to discuss concerns or questions with provider  Diabetes:  (Status: New goal.) Long  Term Goal   Lab Results  Component Value Date   HGBA1C 7.7 (H) 02/05/2022   @ Assessed patient's understanding of A1c goal: <7% Reviewed medications with patient and discussed importance of medication adherence;        Reviewed prescribed diet with patient diabetic/heart healthy; Discussed plans with patient for ongoing care management follow up and provided patient with direct contact information for care management team;      Referral made to social work team for assistance with financial resources;      Review of patient status, including review of consultants reports, relevant laboratory and other test results, and medications completed;       Assessed social determinant of health barriers;        Advised patient to go to [email or web address] to apply for food benefits Advised patient to journal BS readings and take to all provider appointments  Patient Goals/Self-Care Activities: Take medications as prescribed   Attend all scheduled provider appointments Call provider office for new concerns or questions  Work with the social worker to address care coordination needs and will continue to work with the clinical team to address health care and disease management related needs call office if I gain more than 2 pounds in one day or 5 pounds in one week use salt in moderation watch for swelling in feet, ankles and legs every day bring diary to all appointments keep appointment with eye doctor check blood sugar at prescribed times: once daily take the blood sugar log to all doctor visits drink 6 to 8 glasses of water each day fill half of plate with vegetables       Follow Up:  Patient agrees to Care Plan and Follow-up.  Plan: The Managed Medicaid care management team will reach out to the patient again over the next 60 days.  Date/time of next scheduled RN care management/care coordination outreach:  08/21/21 @ 1:15pm  Lurena Joiner RN, BSN [place]  [facility]

## 2022-06-21 NOTE — Progress Notes (Signed)
Daily Session Note  Patient Details  Name: Lawrence Murray MRN: [id number] Date of Birth: [birth year] Referring Provider:   Flowsheet Row Cardiac Rehab from 05/30/2022 in [facility] Cardiac and Pulmonary Rehab  Referring Provider Ernestina Penna MD       Encounter Date: 06/21/2022  Check In:  Session Check In - 06/21/22 1556       Check-In   Supervising physician immediately available to respond to emergencies See telemetry face sheet for immediately available ER MD    Location [facility]-Cardiac & Pulmonary Rehab    Staff Present Renita Papa, RN BSN;Joseph Tessie Fass, RCP,RRT,BSRT;Noah [place], [place], Exercise Physiologist    Virtual Visit No    Medication changes reported     No    Fall or balance concerns reported    No    Warm-up and Cool-down Performed on first and last piece of equipment    Resistance Training Performed Yes    VAD Patient? No    PAD/SET Patient? No      Pain Assessment   Currently in Pain? No/denies                Social History   Tobacco Use  Smoking Status Former   Packs/day: 1.00   Years: 23.00   Total pack years: 23.00   Types: Cigarettes   Quit date: 02/04/2022   Years since quitting: 0.3  Smokeless Tobacco Never    Goals Met:  Independence with exercise equipment Exercise tolerated well No report of concerns or symptoms today Strength training completed today  Goals Unmet:  Not Applicable  Comments: Pt able to follow exercise prescription today without complaint.  Will continue to monitor for progression.    Dr. Emily Filbert is Medical Director for [place].  Dr. Ottie Glazier is Medical Director for [facility] Pulmonary Rehabilitation.

## 2022-06-21 NOTE — Patient Instructions (Signed)
Visit Information  Lawrence Murray was given information about Medicaid Managed Care team care coordination services as a part of their [place] Complete Medicaid benefit. Lawrence Murray verbally consented to engagement with the [facility] Managed Care team.   If you are experiencing a medical emergency, please call 911 or report to your local emergency department or urgent care.   If you have a non-emergency medical problem during routine business hours, please contact your provider's office and ask to speak with a nurse.   For questions related to your [place] Complete Medicaid health plan, please call: [phone]  If you would like to schedule transportation through your [place] Complete Medicaid plan, please call the following number at least 2 days in advance of your appointment: [phone].   There is no limit to the number of trips during the year between medical appointments, healthcare facilities, or pharmacies. Transportation must be scheduled at least 2 business days before but not more than thirty 30 days before of your appointment.  Call the [place] at [phone], at any time, 24 hours a day, 7 days a week. If you are in danger or need immediate medical attention call 911.  If you would like help to quit smoking, call 1-800-QUIT-NOW 903-211-1579) OR Espaol: 1-855-Djelo-Ya (1-540-086-7619) o para ms informacin haga clic aqu or Text READY to 200-400 to register via text  Lawrence Murray - following are the goals we discussed in your visit today:   Please see education materials related to HF and Diabetes provided by MyChart link.  The patient verbalized understanding of instructions, educational materials, and care plan provided today and DECLINED offer to receive copy of patient instructions, educational materials, and care plan.   Telephone follow up appointment with Managed Medicaid care management team member scheduled for:08/21/21 @ 1:15pm  Lurena Joiner RN, BSN [place] RN Care Coordinator   Following is a copy of your plan of care:  Care Plan : RN Care Manager Plan of Care  Updates made by Melissa Montane, RN since 06/21/2022 12:00 AM     Problem: Health Management needs related to CHF and DM      Long-Range Goal: Development of Plan of Care to address Health Management needs related to CHF and DM   Start Date: 06/21/2022  Expected End Date: 09/19/2022  Priority: High  Note:   Current Barriers:  Chronic Disease Management support and education needs related to CHF and DMII  RNCM Clinical Goal(s):  Patient will verbalize understanding of plan for management of CHF and DMII as evidenced by patient reports take all medications exactly as prescribed and will call provider for medication related questions as evidenced by patient reports    attend all scheduled medical appointments: multiple Cardiac Rehab visits, 06/29/22 with PCP, 07/31/22 with Pulmonology and 08/01/22 for Chest CT as evidenced by provider documentation I EMR        work with Education officer, museum to address Financial constraints related to limited/no income related to the management of CHF and DMII as evidenced by review of EMR and patient or Education officer, museum report     through collaboration with Consulting civil engineer, provider, and care team.   Interventions: Inter-disciplinary care team collaboration (see longitudinal plan of care) Evaluation of current treatment plan related to  self management and patient's adherence to plan as established by provider Provided therapeutic listening Advised patient to contact Medicaid Worker regarding bill received for copays while in the hospital Congratulated patient on smoking  cessation   Heart Failure Interventions:  (Status: New goal.)  Long Term Goal  Wt Readings from Last 3 Encounters:  06/14/22 251 lb (113.9 kg)  06/01/22 255 lb 4.8 oz (115.8 kg)  05/30/22 254 lb 14.4 oz (115.6 kg)   Advised patient to  weigh each morning after emptying bladder Discussed importance of daily weight and advised patient to weigh and record daily Discussed the importance of keeping all appointments with provider Provided patient with education about the role of exercise in the management of heart failure Assessed social determinant of health barriers Referral to BSW for financial resources Advised patient to discuss concerns or questions with provider  Diabetes:  (Status: New goal.) Long Term Goal   Lab Results  Component Value Date   HGBA1C 7.7 (H) 02/05/2022   @ Assessed patient's understanding of A1c goal: <7% Reviewed medications with patient and discussed importance of medication adherence;        Reviewed prescribed diet with patient diabetic/heart healthy; Discussed plans with patient for ongoing care management follow up and provided patient with direct contact information for care management team;      Referral made to social work team for assistance with financial resources;      Review of patient status, including review of consultants reports, relevant laboratory and other test results, and medications completed;       Assessed social determinant of health barriers;        Advised patient to go to [email or web address] to apply for food benefits Advised patient to journal BS readings and take to all provider appointments  Patient Goals/Self-Care Activities: Take medications as prescribed   Attend all scheduled provider appointments Call provider office for new concerns or questions  Work with the social worker to address care coordination needs and will continue to work with the clinical team to address health care and disease management related needs call office if I gain more than 2 pounds in one day or 5 pounds in one week use salt in moderation watch for swelling in feet, ankles and legs every day bring diary to all appointments keep appointment with eye doctor check blood sugar at prescribed  times: once daily take the blood sugar log to all doctor visits drink 6 to 8 glasses of water each day fill half of plate with vegetables

## 2022-06-25 ENCOUNTER — Encounter: Payer: Medicaid Other | Admitting: *Deleted

## 2022-06-25 DIAGNOSIS — I5042 Chronic combined systolic (congestive) and diastolic (congestive) heart failure: Secondary | ICD-10-CM

## 2022-06-25 DIAGNOSIS — I5043 Acute on chronic combined systolic (congestive) and diastolic (congestive) heart failure: Secondary | ICD-10-CM | POA: Diagnosis not present

## 2022-06-25 NOTE — Progress Notes (Signed)
Daily Session Note  Patient Details  Name: Vergil Stohr MRN: [id number] Date of Birth: 04/07/1966 Referring Provider:   Flowsheet Row Cardiac Rehab from 05/30/2022 in [facility] Cardiac and Pulmonary Rehab  Referring Provider Tosyn, Agbor-Etang MD       Encounter Date: 06/25/2022  Check In:  Session Check In - 06/25/22 1622       Check-In   Supervising physician immediately available to respond to emergencies See telemetry face sheet for immediately available ER MD    Location [facility]-Cardiac & Pulmonary Rehab    Staff Present Noah Tickle, BS, Exercise Physiologist;Meredith Craven, RN BSN; , RN, ADN    Virtual Visit No    Medication changes reported     No    Fall or balance concerns reported    No    Warm-up and Cool-down Performed on first and last piece of equipment    Resistance Training Performed Yes    VAD Patient? No    PAD/SET Patient? No      Pain Assessment   Currently in Pain? No/denies                Social History   Tobacco Use  Smoking Status Former   Packs/day: 1.00   Years: 23.00   Total pack years: 23.00   Types: Cigarettes   Quit date: 02/04/2022   Years since quitting: 0.3  Smokeless Tobacco Never    Goals Met:  Independence with exercise equipment Exercise tolerated well No report of concerns or symptoms today Strength training completed today  Goals Unmet:  Not Applicable  Comments: Pt able to follow exercise prescription today without complaint.  Will continue to monitor for progression.    Dr. Mark Miller is Medical Director for [facility].  Dr. Fuad Aleskerov is Medical Director for [facility]. 

## 2022-06-26 ENCOUNTER — Other Ambulatory Visit: Payer: Medicaid Other

## 2022-06-26 NOTE — Patient Outreach (Signed)
  Medicaid Managed Care   Unsuccessful Outreach Note  06/26/2022 Name: Lawrence Murray MRN: [id number] DOB: July 27, 1966  Referred by: Theresia Lo, NP Reason for referral : High Risk Managed Medicaid (MM Social work telephone outreach )   An unsuccessful telephone outreach was attempted today. The patient was referred to the case management team for assistance with care management and care coordination.   Follow Up Plan: The care management team will reach out to the patient again over the next 7 days.   Mickel Fuchs, BSW, [place] Managed Medicaid Team  [phone]

## 2022-06-26 NOTE — Patient Instructions (Signed)
  Medicaid Managed Care   Unsuccessful Outreach Note  06/26/2022 Name: Lawrence Murray MRN: [id number] DOB: December 02, 1965  Referred by: Theresia Lo, NP Reason for referral : High Risk Managed Medicaid (MM Social work telephone outreach )   An unsuccessful telephone outreach was attempted today. The patient was referred to the case management team for assistance with care management and care coordination.   Follow Up Plan: The care management team will reach out to the patient again over the next 7 days.   Mickel Fuchs, BSW, [place] Managed Medicaid Team  [phone]

## 2022-06-27 ENCOUNTER — Encounter: Payer: Medicaid Other | Admitting: *Deleted

## 2022-06-27 DIAGNOSIS — I5042 Chronic combined systolic (congestive) and diastolic (congestive) heart failure: Secondary | ICD-10-CM

## 2022-06-27 DIAGNOSIS — I5043 Acute on chronic combined systolic (congestive) and diastolic (congestive) heart failure: Secondary | ICD-10-CM | POA: Diagnosis not present

## 2022-06-27 NOTE — Progress Notes (Signed)
Daily Session Note  Patient Details  Name: Lawrence Murray MRN: [id number] Date of Birth: 1966/04/01 Referring Provider:   Flowsheet Row Cardiac Rehab from 05/30/2022 in [facility] Cardiac and Pulmonary Rehab  Referring Provider Ernestina Penna MD       Encounter Date: 06/27/2022  Check In:  Session Check In - 06/27/22 1539       Check-In   Supervising physician immediately available to respond to emergencies See telemetry face sheet for immediately available ER MD    Location [facility] Re[facility]    Staff Present Justin Mend, RCP,RRT,BSRT;Efe Fazzino Sherryll Burger, RN Odelia Gage, RN, ADN    Virtual Visit No    Medication changes reported     No    Fall or balance concerns reported    No    Warm-up and Cool-down Performed on first and last piece of equipment    Resistance Training Performed Yes    VAD Patient? No    PAD/SET Patient? No      Pain Assessment   Currently in Pain? No/denies                Social History   Tobacco Use  Smoking Status Former   Packs/day: 1.00   Years: 23.00   Total pack years: 23.00   Types: Cigarettes   Quit date: 02/04/2022   Years since quitting: 0.3  Smokeless Tobacco Never    Goals Met:  Independence with exercise equipment Exercise tolerated well No report of concerns or symptoms today Strength training completed today  Goals Unmet:  Not Applicable  Comments: Pt able to follow exercise prescription today without complaint.  Will continue to monitor for progression.    Dr. Emily Filbert is Medical Director for [facility].  Dr. Ottie Glazier is Medical Director for [facility]monary [facility].

## 2022-06-28 ENCOUNTER — Encounter: Payer: Medicaid Other | Admitting: *Deleted

## 2022-06-28 DIAGNOSIS — I5042 Chronic combined systolic (congestive) and diastolic (congestive) heart failure: Secondary | ICD-10-CM

## 2022-06-28 DIAGNOSIS — I5043 Acute on chronic combined systolic (congestive) and diastolic (congestive) heart failure: Secondary | ICD-10-CM | POA: Diagnosis not present

## 2022-06-28 NOTE — Progress Notes (Signed)
Daily Session Note  Patient Details  Name: Lawrence Murray MRN: [id number] Date of Birth: Nov 06, 1965 Referring Provider:   Flowsheet Row Cardiac Rehab from 05/30/2022 in [facility] Cardiac and Pulmonary Rehab  Referring Provider Ernestina Penna MD       Encounter Date: 06/28/2022  Check In:  Session Check In - 06/28/22 1554       Check-In   Supervising physician immediately available to respond to emergencies See telemetry face sheet for immediately available ER MD    Location [facility]Card[facility] & [facility] Re[facility]    Staff Present Justin Mend, Lorre Nick, MA, RCEP, CCRP, CCET;Tavares Levinson Sherryll Burger, RN BSN    Virtual Visit No    Medication changes reported     No    Fall or balance concerns reported    No    Warm-up and Cool-down Performed on first and last piece of equipment    Resistance Training Performed Yes    VAD Patient? No    PAD/SET Patient? No      Pain Assessment   Currently in Pain? No/denies                Social History   Tobacco Use  Smoking Status Former   Packs/day: 1.00   Years: 23.00   Total pack years: 23.00   Types: Cigarettes   Quit date: 02/04/2022   Years since quitting: 0.3  Smokeless Tobacco Never    Goals Met:  Independence with exercise equipment Exercise tolerated well No report of concerns or symptoms today Strength training completed today  Goals Unmet:  Not Applicable  Comments: Pt able to follow exercise prescription today without complaint.  Will continue to monitor for progression.    Dr. Emily Filbert is Medical Director for [facility].  Dr. Ottie Glazier is Medical Director for [facility]monary [facility].

## 2022-06-29 ENCOUNTER — Encounter: Payer: Self-pay | Admitting: Nurse Practitioner

## 2022-06-29 ENCOUNTER — Ambulatory Visit (INDEPENDENT_AMBULATORY_CARE_PROVIDER_SITE_OTHER): Payer: Medicaid Other | Admitting: Nurse Practitioner

## 2022-06-29 VITALS — BP 106/75 | HR 81 | Temp 97.1°F | Ht 73.0 in | Wt 258.4 lb

## 2022-06-29 DIAGNOSIS — E1165 Type 2 diabetes mellitus with hyperglycemia: Secondary | ICD-10-CM

## 2022-06-29 DIAGNOSIS — F419 Anxiety disorder, unspecified: Secondary | ICD-10-CM | POA: Diagnosis not present

## 2022-06-29 DIAGNOSIS — R600 Localized edema: Secondary | ICD-10-CM

## 2022-06-29 DIAGNOSIS — I10 Essential (primary) hypertension: Secondary | ICD-10-CM | POA: Diagnosis not present

## 2022-06-29 NOTE — Progress Notes (Signed)
Established Patient Office Visit  Subjective:  Patient ID: Lawrence Murray, male    DOB: [birth year]  Age: 56 y.o. MRN: [id number]  CC:  Chief Complaint  Patient presents with   Diabetes    Off lyrica and put on gabapentin and there is no difference. He is not sleeping well, xanax helped but not as much as seroquel.      HPI  [name] presents for follow up diabetes and anxiety.  Diabetes Pertinent negatives for diabetes include no chest pain.     Past Medical History:  Diagnosis Date   CHF (congestive heart failure) (HCC)    Coronary artery disease    Diabetes mellitus without complication ([place])    Hypertension     Past Surgical History:  Procedure Laterality Date   COLONOSCOPY WITH PROPOFOL N/A 06/14/2022   Procedure: COLONOSCOPY WITH PROPOFOL;  Surgeon: [name], MD;  Location: [facility] ENDOSCOPY;  Service: Gastroenterology;  Laterality: N/A;   LOWER EXTREMITY ANGIOGRAPHY Left 02/14/2022   Procedure: Lower Extremity Angiography;  Surgeon: Algernon Huxley, MD;  Location: [place] CV LAB;  Service: Cardiovascular;  Laterality: Left;   RIGHT/LEFT HEART CATH AND CORONARY ANGIOGRAPHY N/A 02/21/2022   Procedure: RIGHT/LEFT HEART CATH AND CORONARY ANGIOGRAPHY;  Surgeon: Nelva Bush, MD;  Location: [place] CV LAB;  Service: Cardiovascular;  Laterality: N/A;    Family History  Problem Relation Age of Onset   Congestive Heart Failure Father     Social History   Socioeconomic History   Marital status: Single    Spouse name: Not on file   Number of children: Not on file   Years of education: Not on file   Highest education level: Not on file  Occupational History   Not on file  Tobacco Use   Smoking status: Former    Packs/day: 1.00    Years: 32.00    Total pack years: 32.00    Types: Cigarettes    Quit date: 02/04/2022    Years since quitting: 0.4   Smokeless tobacco: Never  Vaping Use   Vaping Use: Not on file  Substance and Sexual Activity    Alcohol use: Not Currently   Drug use: Never   Sexual activity: Not Currently  Other Topics Concern   Not on file  Social History Narrative   Not on file   Social Determinants of Health   Financial Resource Strain: High Risk (03/09/2022)   Overall Financial Resource Strain (CARDIA)    Difficulty of Paying Living Expenses: Hard  Food Insecurity: Food Insecurity Present (03/09/2022)   Hunger Vital Sign    Worried About Running Out of Food in the Last Year: Sometimes true    Ran Out of Food in the Last Year: Sometimes true  Transportation Needs: No Transportation Needs (06/21/2022)   PRAPARE - Hydrologist (Medical): No    Lack of Transportation (Non-Medical): No  Physical Activity: Not on file  Stress: Not on file  Social Connections: Not on file  Intimate Partner Violence: Not on file     Outpatient Medications Prior to Visit  Medication Sig Dispense Refill   Accu-Chek Softclix Lancets lancets Use as directed to check blood glucose daily 100 each 0   acetaminophen (TYLENOL) 500 MG tablet Take 2 tablets (1,000 mg total) by mouth every 6 (six) hours as needed for mild pain, moderate pain, fever or headache. 30 tablet 0   Blood Glucose Monitoring Suppl (ACCU-CHEK GUIDE ME) w/Device KIT Use as directed  daily 1 kit 0   dapagliflozin propanediol (FARXIGA) 10 MG TABS tablet Take one tablet by mouth daily 90 tablet 3   docusate sodium (STOOL SOFTENER) 250 MG capsule Take 250 mg by mouth daily.     glucose blood test strip Use as directed once daily 100 each 0   metFORMIN (GLUCOPHAGE) 500 MG tablet Take 1 tablet (500 mg total) by mouth 2 (two) times daily. 180 tablet 3   pantoprazole (PROTONIX) 40 MG tablet Take 1 tablet (40 mg total) by mouth daily. 30 tablet 11   Zoster Vaccine Adjuvanted [facility]) injection Inject into the muscle. 0.5 mL 1   ALPRAZolam (XANAX) 1 MG tablet Take 0.5 tablets (0.5 mg total) by mouth 3 (three) times daily as needed for anxiety. 90  tablet 0   aspirin EC 81 MG tablet Take 1 tablet (81 mg total) by mouth daily. Swallow whole. 30 tablet 12   atorvastatin (LIPITOR) 80 MG tablet Take 1 tablet (80 mg total) by mouth daily. 90 tablet 0   digoxin (LANOXIN) 0.125 MG tablet Take 1 tablet (0.125 mg total) by mouth daily. 90 tablet 0   furosemide (LASIX) 40 MG tablet Take 1.5 tablets (60 mg total) by mouth daily. 270 tablet 0   gabapentin (NEURONTIN) 100 MG capsule Take 1 capsule (100 mg total) by mouth 3 (three) times daily. 60 capsule 1   metoprolol succinate (TOPROL-XL) 25 MG 24 hr tablet Take 0.5 tablets (12.5 mg total) by mouth daily. Please keep scheduled appointment for additional refills. 30 tablet 0   potassium chloride SA (KLOR-CON M) 20 MEQ tablet Take 1 tablet (20 mEq total) by mouth daily. 30 tablet 11   spironolactone (ALDACTONE) 25 MG tablet Take 0.5 tablets (12.5 mg total) by mouth daily. 30 tablet 11   No facility-administered medications prior to visit.    No Known Allergies  ROS Review of Systems  Constitutional: Negative.   HENT: Negative.    Eyes: Negative.   Respiratory:  Negative for chest tightness and shortness of breath.   Cardiovascular:  Positive for leg swelling. Negative for chest pain and palpitations.  Musculoskeletal: Negative.       Objective:    Physical Exam Constitutional:      Appearance: Normal appearance. He is obese.  HENT:     Right Ear: Tympanic membrane normal.     Left Ear: Tympanic membrane normal.     Mouth/Throat:     Mouth: Mucous membranes are moist.  Eyes:     Extraocular Movements: Extraocular movements intact.     Pupils: Pupils are equal, round, and reactive to light.  Cardiovascular:     Rate and Rhythm: Normal rate and regular rhythm.  Pulmonary:     Effort: Pulmonary effort is normal.     Breath sounds: Normal breath sounds.  Abdominal:     General: Bowel sounds are normal. There is distension.     Palpations: Abdomen is soft. There is no mass.      Hernia: No hernia is present.  Musculoskeletal:     Right lower leg: Edema present.     Left lower leg: Edema present.  Neurological:     General: No focal deficit present.     Mental Status: He is alert and oriented to person, place, and time. Mental status is at baseline.  Psychiatric:        Mood and Affect: Mood normal.        Behavior: Behavior normal.  Thought Content: Thought content normal.        Judgment: Judgment normal.     BP 106/75   Pulse 81   Temp (!) 97.1 F (36.2 C) (Temporal)   Ht _0  (1.854 m)   Wt 258 lb 6.4 oz (117.2 kg)   SpO2 98%   BMI 34.09 kg/m  Wt Readings from Last 3 Encounters:  07/30/22 261 lb 3.2 oz (118.5 kg)  06/29/22 258 lb 6.4 oz (117.2 kg)  06/14/22 251 lb (113.9 kg)     Health Maintenance  Topic Date Due   Diabetic kidney evaluation - Urine ACR  Never done   Lung Cancer Screening  Never done   Hepatitis C Screening  06/02/2023 (Originally 06/09/1985)   INFLUENZA VACCINE  06/20/2023 (Originally 03/20/2022)   COVID-19 Vaccine (1) 06/20/2023 (Originally 12/09/1966)   Zoster Vaccines- Shingrix (2 of 2) 08/03/2022   HEMOGLOBIN A1C  08/07/2022   FOOT EXAM  05/16/2023   Diabetic kidney evaluation - eGFR measurement  06/09/2023   OPHTHALMOLOGY EXAM  07/25/2023   COLONOSCOPY (Pts 45-90yr Insurance coverage will need to be confirmed)  06/14/2029   DTaP/Tdap/Td (2 - Td or Tdap) 06/01/2032   HIV Screening  Completed   HPV VACCINES  Aged Out    There are no preventive care reminders to display for this patient.  Lab Results  Component Value Date   TSH 9.011 (H) 02/05/2022   Lab Results  Component Value Date   WBC 7.3 02/23/2022   HGB 13.3 02/23/2022   HCT 40.8 02/23/2022   MCV 94.0 02/23/2022   PLT 368 02/23/2022   Lab Results  Component Value Date   NA 136 06/08/2022   K 4.7 06/08/2022   CO2 27 06/08/2022   GLUCOSE 192 (H) 06/08/2022   BUN 19 06/08/2022   CREATININE 1.10 06/08/2022   BILITOT 1.0 02/11/2022    ALKPHOS 78 02/11/2022   AST 22 02/11/2022   ALT 27 02/11/2022   PROT 6.8 02/11/2022   ALBUMIN 3.1 (L) 02/11/2022   CALCIUM 9.7 06/08/2022   ANIONGAP 9 06/08/2022   Lab Results  Component Value Date   CHOL 242 (H) 02/24/2022   Lab Results  Component Value Date   HDL 35 (L) 02/24/2022   Lab Results  Component Value Date   LDLCALC 160 (H) 02/24/2022   Lab Results  Component Value Date   TRIG 235 (H) 02/24/2022   Lab Results  Component Value Date   CHOLHDL 6.9 02/24/2022   Lab Results  Component Value Date   HGBA1C 7.7 (H) 02/05/2022      Assessment & Plan:   Problem List Items Addressed This Visit       Cardiovascular and Mediastinum   Primary hypertension    Patient BP  Vitals:   06/29/22 1508  BP: 106/75    in the office today. Advised pt to follow a low sodium and heart healthy diet. Continue the current medication regimen.         Endocrine   Type 2 diabetes mellitus with hyperglycemia ([place] - Primary    His previous hemoglobin A1c 7.7 on 02/06/2022 Advised pt to check the BS regularly, make a log and bring to next appointment.  Advised pt to eat variety of food including fruits, vegetables, whole grains, complex carbohydrates and proteins.  Continue Farxiga and metformin         Other   Bilateral edema of lower extremity    Encourage patient to elevate the feet and use compressed  compression stockings. Continue spironolactone      Anxiety    Encourage patient to perform deep breathing exercises, meditation and journaling. Continue Xanax 0.5 mg as needed 3 times a day        No orders of the defined types were placed in this encounter.    Follow-up: No follow-ups on file.    Theresia Lo, NP

## 2022-07-02 ENCOUNTER — Telehealth: Payer: Self-pay

## 2022-07-02 ENCOUNTER — Encounter: Payer: Medicaid Other | Admitting: *Deleted

## 2022-07-02 DIAGNOSIS — I5043 Acute on chronic combined systolic (congestive) and diastolic (congestive) heart failure: Secondary | ICD-10-CM | POA: Diagnosis not present

## 2022-07-02 DIAGNOSIS — I5042 Chronic combined systolic (congestive) and diastolic (congestive) heart failure: Secondary | ICD-10-CM

## 2022-07-02 NOTE — Progress Notes (Signed)
Daily Session Note  Patient Details  Name: Lawrence Murray MRN: [id number] Date of Birth: 18-Apr-1966 Referring Provider:   Flowsheet Row Cardiac Rehab from 05/30/2022 in [facility] Cardiac and Pulmonary Rehab  Referring Provider Ernestina Penna MD       Encounter Date: 07/02/2022  Check In:  Session Check In - 07/02/22 1544       Check-In   Staff Present Antionette Fairy, BS, Exercise Physiologist;Joseph [place], Ernestina Patches, RN, [place]    Virtual Visit No    Medication changes reported     No    Fall or balance concerns reported    No    Warm-up and Cool-down Performed on first and last piece of equipment    Resistance Training Performed Yes    VAD Patient? No    PAD/SET Patient? No      Pain Assessment   Currently in Pain? No/denies                Social History   Tobacco Use  Smoking Status Former   Packs/day: 1.00   Years: 23.00   Total pack years: 23.00   Types: Cigarettes   Quit date: 02/04/2022   Years since quitting: 0.4  Smokeless Tobacco Never    Goals Met:  Independence with exercise equipment Exercise tolerated well No report of concerns or symptoms today Strength training completed today  Goals Unmet:  Not Applicable  Comments: Pt able to follow exercise prescription today without complaint.  Will continue to monitor for progression.    Dr. Emily Filbert is Medical Director for [facility].  Dr. Ottie Glazier is Medical Director for [facility] Pulmonary Rehabilitation.

## 2022-07-02 NOTE — Patient Instructions (Signed)
Visit Information  Lawrence Murray was given information about Medicaid Managed Care team care coordination services as a part of their [place] Complete Medicaid benefit. Lawrence Murray verbally consented to engagement with the [facility] Managed Care team.   If you are experiencing a medical emergency, please call 911 or report to your local emergency department or urgent care.   If you have a non-emergency medical problem during routine business hours, please contact your provider's office and ask to speak with a nurse.   For questions related to your [place] Complete Medicaid health plan, please call: [phone]  If you would like to schedule transportation through your [place] Complete Medicaid plan, please call the following number at least 2 days in advance of your appointment: [phone].   There is no limit to the number of trips during the year between medical appointments, healthcare facilities, or pharmacies. Transportation must be scheduled at least 2 business days before but not more than thirty 30 days before of your appointment.  Call the [place] at [phone], at any time, 24 hours a day, 7 days a week. If you are in danger or need immediate medical attention call 911.  If you would like help to quit smoking, call 1-800-QUIT-NOW (530)546-0072) OR Espaol: 1-855-Djelo-Ya (0-932-671-2458) o para ms informacin haga clic aqu or Text READY to 200-400 to register via text  [name] - following are the goals we discussed in your visit today:    Social Worker will follow up in 14 days .   Lawrence Murray, Lawrence Murray, [place] Managed Medicaid Team  [phone]   Following is a copy of your plan of care:  There are no care plans that you recently modified to display for this patient.

## 2022-07-02 NOTE — Patient Outreach (Signed)
Medicaid Managed Care Social Work Note  07/02/2022 Name:  Lawrence Murray MRN:  [id number] DOB:  June 12, 1966  Lawrence Murray is an 56 y.o. year old male who is a primary patient of Lawrence Murray.  The [facility] Managed Care Coordination team was consulted for assistance with:  Community Resources   Lawrence Murray was given information about Medicaid Managed Care Coordination team services today. Lawrence Murray Patient agreed to services and verbal consent obtained.  Engaged with patient  for by telephone forinitial visit in response to referral for case management and/or care coordination services.   Assessments/Interventions:  Review of past medical history, allergies, medications, health status, including review of consultants reports, laboratory and other test data, was performed as part of comprehensive evaluation and provision of chronic care management services.  SDOH: (Social Determinant of Health) assessments and interventions performed: SDOH Interventions    Flowsheet Row Patient Outreach Telephone from 06/21/2022 in [place] Coordination Nutrition from 05/11/2022 in [place] Documentation from 03/09/2022 in [place]  SDOH Interventions     Food Insecurity Interventions -- -- Intervention Not Indicated  Housing Interventions Intervention Not Indicated -- Intervention Not Indicated  Transportation Interventions Intervention Not Indicated -- Intervention Not Indicated  Depression Interventions/Treatment  -- Medication --  Financial Strain Interventions -- -- Development worker, community  [Pending with [facility]]     BSW completed a telephone outreach with patient. He stated that he does not have a mobile number and to call his home phone number. BSW and patient discussed SDOH needs. Patient currently lives in the Household with his father and son and they are living off of about 1680 monthly.  Patient does not receive any income or any foodstamps. BSW will email patient resources for food and utilites to [email or web address] and continue to follow up with patient.   Advanced Directives Status:  Not addressed in this encounter.  Care Plan                 No Known Allergies  Medications Reviewed Today     Reviewed by Lawrence Murray (Certified Medical Assistant) on 06/29/22 at 1508  Med List Status: <None>   Medication Order Taking? Sig Documenting Provider Last Dose Status Informant  Accu-Chek Softclix Lancets lancets [id number] Yes Use as directed to check blood glucose daily  Taking Active   acetaminophen (TYLENOL) 500 MG tablet [id number] Yes Take 2 tablets (1,000 mg total) by mouth every 6 (six) hours as needed for mild pain, moderate pain, fever or headache. Lawrence Murray Taking Active   ALPRAZolam Duanne Moron) 1 MG tablet [id number] Yes Take 0.5 tablets (0.5 mg total) by mouth 3 (three) times daily as needed for anxiety. Lawrence Murray Taking Active   aspirin EC 81 MG tablet [id number] Yes Take 1 tablet (81 mg total) by mouth daily. Swallow whole. Lawrence Murray Taking Active   atorvastatin (LIPITOR) 80 MG tablet [id number] Yes Take 1 tablet (80 mg total) by mouth daily. Lawrence Murray Taking Active   Blood Glucose Monitoring Suppl (ACCU-CHEK GUIDE ME) w/Device KIT [id number] Yes Use as directed daily  Taking Active   dapagliflozin propanediol (FARXIGA) 10 MG TABS tablet [id number] Yes Take one tablet by mouth daily  Taking Active   digoxin (LANOXIN) 0.125 MG tablet [id number] Yes Take 1 tablet (0.125 mg total) by mouth daily. Lawrence Murray Taking Active   docusate sodium (STOOL SOFTENER) 250 MG capsule [id number] Yes Take 250  mg by mouth daily. Provider, Historical, Murray Taking Active   furosemide (LASIX) 40 MG tablet [id number] Yes Take 1.5 tablets (60 mg total) by mouth daily. Lawrence Murray Taking Active   gabapentin (NEURONTIN) 100  MG capsule [id number] Yes Take 1 capsule (100 mg total) by mouth 3 (three) times daily. Lawrence Murray Taking Active   glucose blood test strip [id number] Yes Use as directed once daily  Taking Active   metFORMIN (GLUCOPHAGE) 500 MG tablet [id number] Yes Take 1 tablet (500 mg total) by mouth 2 (two) times daily. Lawrence Murray Taking Active   metoprolol succinate (TOPROL-XL) 25 MG 24 hr tablet [id number] Yes Take 0.5 tablets (12.5 mg total) by mouth daily. Please keep scheduled appointment for additional refills. Lawrence Murray Taking Active   pantoprazole (PROTONIX) 40 MG tablet [id number] Yes Take 1 tablet (40 mg total) by mouth daily. Lawrence Murray Taking Active   potassium chloride SA (KLOR-CON M) 20 MEQ tablet [id number] Yes Take 1 tablet (20 mEq total) by mouth daily. Lawrence Murray Taking Active     Discontinued 05/31/22 1410 (Discontinued by provider)            Med Note>> Lawrence Murray, [facility]   05/31/2022  2:10 PM needs to get from pcp    spironolactone (ALDACTONE) 25 MG tablet [id number] Yes Take 0.5 tablets (12.5 mg total) by mouth daily. Lawrence Murray Taking Active   Zoster Vaccine Adjuvanted [facility]) injection [id number] Yes Inject into the muscle. Lawrence Murray Taking Active             Patient Active Problem List   Diagnosis Date Noted   Type 2 diabetes mellitus without complication, without long-term current use of insulin ([place]) 06/20/2022   Encounter for screening colonoscopy    Adenomatous polyp of colon    Peripheral neuropathy 05/20/2022   Lower extremity edema 05/20/2022   Anxiety 05/20/2022   Primary hypertension 05/03/2022   Chronic combined systolic and diastolic congestive heart failure ([place]) 05/03/2022   Coronary artery disease 02/21/2022   Acute HFrEF (heart failure with reduced ejection fraction) (HCC)    PVD (peripheral vascular disease) ([place]) 02/12/2022   Type 2 diabetes mellitus with hyperglycemia ([place])  02/07/2022   Smoker    Acute on chronic combined systolic and diastolic CHF (congestive heart failure) ([place]) 02/05/2022    Conditions to be addressed/monitored per PCP order:   community resources  There are no care plans that you recently modified to display for this patient.   Follow up:  Patient agrees to Care Plan and Follow-up.  Plan: The Managed Medicaid care management team will reach out to the patient again over the next 14 days.  Date/time of next scheduled Social Work care management/care coordination outreach:  07/20/22  Mickel Fuchs, Arita Miss, [place] Medicaid Team  [phone]

## 2022-07-04 ENCOUNTER — Encounter: Payer: Medicaid Other | Admitting: *Deleted

## 2022-07-04 DIAGNOSIS — I5043 Acute on chronic combined systolic (congestive) and diastolic (congestive) heart failure: Secondary | ICD-10-CM | POA: Diagnosis not present

## 2022-07-04 DIAGNOSIS — I5042 Chronic combined systolic (congestive) and diastolic (congestive) heart failure: Secondary | ICD-10-CM

## 2022-07-04 NOTE — Progress Notes (Signed)
Daily Session Note  Patient Details  Name: Lawrence Murray MRN: [id number] Date of Birth: [birth year] Referring Provider:   Flowsheet Row Cardiac Rehab from 05/30/2022 in [facility] Cardiac and Pulmonary Rehab  Referring Provider Ernestina Penna MD       Encounter Date: 07/04/2022  Check In:  Session Check In - 07/04/22 1538       Check-In   Supervising physician immediately available to respond to emergencies See telemetry face sheet for immediately available ER MD    Location [facility]-Cardiac & Pulmonary Rehab    Staff Present Justin Mend, Jaci Carrel, BS, ACSM CEP, Exercise Physiologist;Meredith Sherryll Burger, RN BSN    Virtual Visit No    Medication changes reported     No    Fall or balance concerns reported    No    Tobacco Cessation No Change    Warm-up and Cool-down Performed on first and last piece of equipment    Resistance Training Performed Yes    VAD Patient? No      PAD/SET Patient   Completed foot check today? No      Pain Assessment   Currently in Pain? No/denies    Multiple Pain Sites No                Social History   Tobacco Use  Smoking Status Former   Packs/day: 1.00   Years: 23.00   Total pack years: 23.00   Types: Cigarettes   Quit date: 02/04/2022   Years since quitting: 0.4  Smokeless Tobacco Never    Goals Met:  Proper associated with RPD/PD & O2 Sat Independence with exercise equipment Exercise tolerated well No report of concerns or symptoms today Strength training completed today  Goals Unmet:  Not Applicable  Comments: Pt able to follow exercise prescription today without complaint.  Will continue to monitor for progression.    Dr. Emily Filbert is Medical Director for [place].  Dr. Ottie Glazier is Medical Director for [facility] Pulmonary Rehabilitation.

## 2022-07-05 ENCOUNTER — Encounter: Payer: Medicaid Other | Admitting: *Deleted

## 2022-07-05 DIAGNOSIS — I5043 Acute on chronic combined systolic (congestive) and diastolic (congestive) heart failure: Secondary | ICD-10-CM | POA: Diagnosis not present

## 2022-07-05 DIAGNOSIS — I5042 Chronic combined systolic (congestive) and diastolic (congestive) heart failure: Secondary | ICD-10-CM

## 2022-07-05 NOTE — Progress Notes (Signed)
Daily Session Note  Patient Details  Name: Lawrence Murray MRN: [id number] Date of Birth: 05/20/1966 Referring Provider:   Flowsheet Row Cardiac Rehab from 05/30/2022 in [facility] Cardiac and Pulmonary Rehab  Referring Provider Ernestina Penna MD       Encounter Date: 07/05/2022  Check In:  Session Check In - 07/05/22 1543       Check-In   Supervising physician immediately available to respond to emergencies See telemetry face sheet for immediately available ER MD    Location [facility]-Cardiac & Pulmonary Rehab    Staff Present Renita Papa, RN BSN;Joseph [place], RCP,RRT,BSRT;Laureen [place], [place], RRT, CPFT    Virtual Visit No    Medication changes reported     No    Fall or balance concerns reported    No    Warm-up and Cool-down Performed on first and last piece of equipment    Resistance Training Performed Yes    VAD Patient? No    PAD/SET Patient? No      Pain Assessment   Currently in Pain? No/denies                Social History   Tobacco Use  Smoking Status Former   Packs/day: 1.00   Years: 23.00   Total pack years: 23.00   Types: Cigarettes   Quit date: 02/04/2022   Years since quitting: 0.4  Smokeless Tobacco Never    Goals Met:  Independence with exercise equipment Exercise tolerated well No report of concerns or symptoms today Strength training completed today  Goals Unmet:  Not Applicable  Comments: Pt able to follow exercise prescription today without complaint.  Will continue to monitor for progression.    Dr. Emily Filbert is Medical Director for [place].  Dr. Ottie Glazier is Medical Director for [facility] Pulmonary Rehabilitation.

## 2022-07-09 ENCOUNTER — Other Ambulatory Visit: Payer: Self-pay | Admitting: Nurse Practitioner

## 2022-07-09 ENCOUNTER — Other Ambulatory Visit: Payer: Self-pay

## 2022-07-09 ENCOUNTER — Encounter: Payer: Medicaid Other | Admitting: *Deleted

## 2022-07-09 DIAGNOSIS — I5043 Acute on chronic combined systolic (congestive) and diastolic (congestive) heart failure: Secondary | ICD-10-CM | POA: Diagnosis not present

## 2022-07-09 DIAGNOSIS — I5042 Chronic combined systolic (congestive) and diastolic (congestive) heart failure: Secondary | ICD-10-CM

## 2022-07-09 NOTE — Progress Notes (Signed)
Daily Session Note  Patient Details  Name: Lawrence Murray MRN: [id number] Date of Birth: [birth year] Referring Provider:   Flowsheet Row Cardiac Rehab from 05/30/2022 in [facility] Cardiac and Pulmonary Rehab  Referring Provider Ernestina Penna MD       Encounter Date: 07/09/2022  Check In:  Session Check In - 07/09/22 [place]       Check-In   Supervising physician immediately available to respond to emergencies See telemetry face sheet for immediately available ER MD    Location [facility]-Cardiac & Pulmonary Rehab    Staff Present Antionette Fairy, BS, Exercise Physiologist;Laureen Owens Shark, BS, RRT, CPFT;Jenny Omdahl Sherryll Burger, RN BSN    Virtual Visit No    Medication changes reported     No    Fall or balance concerns reported    No    Warm-up and Cool-down Performed on first and last piece of equipment    Resistance Training Performed Yes    VAD Patient? No    PAD/SET Patient? No      Pain Assessment   Currently in Pain? No/denies                Social History   Tobacco Use  Smoking Status Former   Packs/day: 1.00   Years: 23.00   Total pack years: 23.00   Types: Cigarettes   Quit date: 02/04/2022   Years since quitting: 0.4  Smokeless Tobacco Never    Goals Met:  Independence with exercise equipment Exercise tolerated well Strength training completed today  Goals Unmet:  Not Applicable  Comments: Pt able to follow exercise prescription today without complaint.  Will continue to monitor for progression.    Dr. Emily Filbert is Medical Director for [place].  Dr. Ottie Glazier is Medical Director for [facility] Pulmonary Rehabilitation.

## 2022-07-10 ENCOUNTER — Other Ambulatory Visit: Payer: Self-pay

## 2022-07-11 ENCOUNTER — Other Ambulatory Visit: Payer: Self-pay

## 2022-07-11 ENCOUNTER — Encounter: Payer: Medicaid Other | Admitting: *Deleted

## 2022-07-11 DIAGNOSIS — I5043 Acute on chronic combined systolic (congestive) and diastolic (congestive) heart failure: Secondary | ICD-10-CM | POA: Diagnosis not present

## 2022-07-11 DIAGNOSIS — I5042 Chronic combined systolic (congestive) and diastolic (congestive) heart failure: Secondary | ICD-10-CM

## 2022-07-11 MED FILL — Gabapentin Cap 100 MG: ORAL | 20 days supply | Qty: 60 | Fill #0 | Status: AC

## 2022-07-11 MED FILL — Alprazolam Tab 1 MG: ORAL | 30 days supply | Qty: 45 | Fill #0 | Status: AC

## 2022-07-11 NOTE — Progress Notes (Signed)
Daily Session Note  Patient Details  Name: Lawrence Murray MRN: [id number] Date of Birth: 1965/12/13 Referring Provider:   Flowsheet Row Cardiac Rehab from 05/30/2022 in [facility] Cardiac and Pulmonary Rehab  Referring Provider Ernestina Penna MD       Encounter Date: 07/11/2022  Check In:  Session Check In - 07/11/22 [name]       Check-In   Supervising physician immediately available to respond to emergencies See telemetry face sheet for immediately available ER MD    Location [facility]-Cardiac & Pulmonary Rehab    Staff Present Renita Papa, RN BSN;Laureen Owens Shark, BS, RRT, CPFT;Megan Tamala Julian, RN, ADN    Virtual Visit No    Medication changes reported     No    Fall or balance concerns reported    No    Warm-up and Cool-down Performed on first and last piece of equipment    Resistance Training Performed Yes    VAD Patient? No    PAD/SET Patient? No      Pain Assessment   Currently in Pain? No/denies                Social History   Tobacco Use  Smoking Status Former   Packs/day: 1.00   Years: 23.00   Total pack years: 23.00   Types: Cigarettes   Quit date: 02/04/2022   Years since quitting: 0.4  Smokeless Tobacco Never    Goals Met:  Independence with exercise equipment Exercise tolerated well No report of concerns or symptoms today Strength training completed today  Goals Unmet:  Not Applicable  Comments: Pt able to follow exercise prescription today without complaint.  Will continue to monitor for progression.    Dr. Emily Filbert is Medical Director for [place].  Dr. Ottie Glazier is Medical Director for [facility] Pulmonary Rehabilitation.

## 2022-07-16 ENCOUNTER — Encounter: Payer: Medicaid Other | Admitting: *Deleted

## 2022-07-16 DIAGNOSIS — I5042 Chronic combined systolic (congestive) and diastolic (congestive) heart failure: Secondary | ICD-10-CM

## 2022-07-16 DIAGNOSIS — I5043 Acute on chronic combined systolic (congestive) and diastolic (congestive) heart failure: Secondary | ICD-10-CM | POA: Diagnosis not present

## 2022-07-16 NOTE — Progress Notes (Signed)
Daily Session Note  Patient Details  Name: Lawrence Murray MRN: [id number] Date of Birth: [birth year] Referring Provider:   Flowsheet Row Cardiac Rehab from 05/30/2022 in [facility] Cardiac and Pulmonary Rehab  Referring Provider Ernestina Penna MD       Encounter Date: 07/16/2022  Check In:  Session Check In - 07/16/22 1555       Check-In   Supervising physician immediately available to respond to emergencies See telemetry face sheet for immediately available ER MD    Location [facility]-Cardiac & Pulmonary Rehab    Staff Present Carson Myrtle, BS, RRT, CPFT;Merryl Buckels Sherryll Burger, RN BSN;Noah Tickle, BS, Exercise Physiologist    Virtual Visit No    Medication changes reported     No    Fall or balance concerns reported    No    Warm-up and Cool-down Performed on first and last piece of equipment    Resistance Training Performed Yes    VAD Patient? No    PAD/SET Patient? No      Pain Assessment   Currently in Pain? No/denies                Social History   Tobacco Use  Smoking Status Former   Packs/day: 1.00   Years: 23.00   Total pack years: 23.00   Types: Cigarettes   Quit date: 02/04/2022   Years since quitting: 0.4  Smokeless Tobacco Never    Goals Met:  Independence with exercise equipment Exercise tolerated well No report of concerns or symptoms today Strength training completed today  Goals Unmet:  Not Applicable  Comments: Pt able to follow exercise prescription today without complaint.  Will continue to monitor for progression.    Dr. Emily Filbert is Medical Director for [place].  Dr. Ottie Glazier is Medical Director for [facility] Pulmonary Rehabilitation.

## 2022-07-17 ENCOUNTER — Encounter: Payer: Self-pay | Admitting: Nurse Practitioner

## 2022-07-18 ENCOUNTER — Encounter: Payer: Medicaid Other | Admitting: *Deleted

## 2022-07-18 ENCOUNTER — Encounter: Payer: Self-pay | Admitting: *Deleted

## 2022-07-18 DIAGNOSIS — I5043 Acute on chronic combined systolic (congestive) and diastolic (congestive) heart failure: Secondary | ICD-10-CM | POA: Diagnosis not present

## 2022-07-18 DIAGNOSIS — I5042 Chronic combined systolic (congestive) and diastolic (congestive) heart failure: Secondary | ICD-10-CM

## 2022-07-18 NOTE — Progress Notes (Signed)
Cardiac Individual Treatment Plan  Patient Details  Name: Timur Nibert MRN: [id number] Date of Birth: 26-Feb-1966 Referring Provider:   Flowsheet Row Cardiac Rehab from 05/30/2022 in [facility] Cardiac and Pulmonary Rehab  Referring Provider Ernestina Penna MD       Initial Encounter Date:  Flowsheet Row Cardiac Rehab from 05/30/2022 in [facility] Cardiac and Pulmonary Rehab  Date 05/30/22       Visit Diagnosis: Chronic combined systolic and diastolic heart failure ([place])  Patient's Home Medications on Admission:  Current Outpatient Medications:    Accu-Chek Softclix Lancets lancets, Use as directed to check blood glucose daily, Disp: 100 each, Rfl: 0   acetaminophen (TYLENOL) 500 MG tablet, Take 2 tablets (1,000 mg total) by mouth every 6 (six) hours as needed for mild pain, moderate pain, fever or headache., Disp: 30 tablet, Rfl: 0   ALPRAZolam (XANAX) 1 MG tablet, Take 0.5 tablets (0.5 mg total) by mouth 3 (three) times daily as needed for anxiety., Disp: 90 tablet, Rfl: 0   aspirin EC 81 MG tablet, Take 1 tablet (81 mg total) by mouth daily. Swallow whole., Disp: 30 tablet, Rfl: 12   atorvastatin (LIPITOR) 80 MG tablet, Take 1 tablet (80 mg total) by mouth daily., Disp: 90 tablet, Rfl: 0   Blood Glucose Monitoring Suppl (ACCU-CHEK GUIDE ME) w/Device KIT, Use as directed daily, Disp: 1 kit, Rfl: 0   dapagliflozin propanediol (FARXIGA) 10 MG TABS tablet, Take one tablet by mouth daily, Disp: 90 tablet, Rfl: 3   digoxin (LANOXIN) 0.125 MG tablet, Take 1 tablet (0.125 mg total) by mouth daily., Disp: 90 tablet, Rfl: 0   docusate sodium (STOOL SOFTENER) 250 MG capsule, Take 250 mg by mouth daily., Disp: , Rfl:    furosemide (LASIX) 40 MG tablet, Take 1.5 tablets (60 mg total) by mouth daily., Disp: 270 tablet, Rfl: 0   gabapentin (NEURONTIN) 100 MG capsule, Take 1 capsule (100 mg total) by mouth 3 (three) times daily., Disp: 60 capsule, Rfl: 1   glucose blood test strip, Use as directed once  daily, Disp: 100 each, Rfl: 0   metFORMIN (GLUCOPHAGE) 500 MG tablet, Take 1 tablet (500 mg total) by mouth 2 (two) times daily., Disp: 180 tablet, Rfl: 3   metoprolol succinate (TOPROL-XL) 25 MG 24 hr tablet, Take 0.5 tablets (12.5 mg total) by mouth daily. Please keep scheduled appointment for additional refills., Disp: 30 tablet, Rfl: 0   pantoprazole (PROTONIX) 40 MG tablet, Take 1 tablet (40 mg total) by mouth daily., Disp: 30 tablet, Rfl: 11   potassium chloride SA (KLOR-CON M) 20 MEQ tablet, Take 1 tablet (20 mEq total) by mouth daily., Disp: 30 tablet, Rfl: 11   spironolactone (ALDACTONE) 25 MG tablet, Take 0.5 tablets (12.5 mg total) by mouth daily., Disp: 30 tablet, Rfl: 11   Zoster Vaccine Adjuvanted (SHINGRIX) injection, Inject into the muscle., Disp: 0.5 mL, Rfl: 1  Past Medical History: Past Medical History:  Diagnosis Date   CHF (congestive heart failure) (HCC)    Coronary artery disease    Diabetes mellitus without complication ([place])    Hypertension     Tobacco Use: Social History   Tobacco Use  Smoking Status Former   Packs/day: 1.00   Years: 23.00   Total pack years: 23.00   Types: Cigarettes   Quit date: 02/04/2022   Years since quitting: 0.4  Smokeless Tobacco Never    Labs: Review Flowsheet       Latest Ref Rng & Units 02/05/2022 02/09/2022 02/24/2022  Labs for ITP Cardiac and Pulmonary Rehab  Cholestrol 0 - 200 mg/dL - - 242   LDL (calc) 0 - 99 mg/dL - - 160   HDL-C >40 mg/dL - - 35   Trlycerides <150 mg/dL - - 235   Hemoglobin A1c 4.8 - 5.6 % 7.7  - -  PH, Arterial 7.35 - 7.45 - 7.52  -  PCO2 arterial 32 - 48 mmHg - 47  -  Bicarbonate 20.0 - 28.0 mmol/L - 38.4  -  O2 Saturation % - 97.6  -     Exercise Target Goals: Exercise Program Goal: Individual exercise prescription set using results from initial 6 min walk test and THRR while considering  patient's activity barriers and safety.   Exercise Prescription Goal: Initial exercise prescription  builds to 30-45 minutes a day of aerobic activity, 2-3 days per week.  Home exercise guidelines will be given to patient during program as part of exercise prescription that the participant will acknowledge.   Education: Aerobic Exercise: - Group verbal and visual presentation on the components of exercise prescription. Introduces F.I.T.T principle from ACSM for exercise prescriptions.  Reviews F.I.T.T. principles of aerobic exercise including progression. Written material given at graduation. Flowsheet Row Cardiac Rehab from 07/11/2022 in [facility] Cardiac and Pulmonary Rehab  Date 06/27/22  Educator [place]  Instruction Review Code 1- Verbalizes Understanding       Education: Resistance Exercise: - Group verbal and visual presentation on the components of exercise prescription. Introduces F.I.T.T principle from ACSM for exercise prescriptions  Reviews F.I.T.T. principles of resistance exercise including progression. Written material given at graduation. Flowsheet Row Cardiac Rehab from 07/11/2022 in [facility] Cardiac and Pulmonary Rehab  Date 07/11/22  Educator KW  Instruction Review Code 1- [employer] Understanding        Education: Exercise & Equipment Safety: - Individual verbal instruction and demonstration of equipment use and safety with use of the equipment. Flowsheet Row Cardiac Rehab from 07/11/2022 in [facility] Cardiac and Pulmonary Rehab  Date 05/21/22  Educator [facility]  Instruction Review Code 1- Verbalizes Understanding       Education: Exercise Physiology & General Exercise Guidelines: - Group verbal and written instruction with models to review the exercise physiology of the cardiovascular system and associated critical values. Provides general exercise guidelines with specific guidelines to those with heart or lung disease.  Flowsheet Row Cardiac Rehab from 07/11/2022 in [facility] Cardiac and Pulmonary Rehab  Date 06/20/22  Educator [place]  Instruction Review Code 1- Verbalizes Understanding        Education: Flexibility, Balance, Mind/Body Relaxation: - Group verbal and visual presentation with interactive activity on the components of exercise prescription. Introduces F.I.T.T principle from ACSM for exercise prescriptions. Reviews F.I.T.T. principles of flexibility and balance exercise training including progression. Also discusses the mind body connection.  Reviews various relaxation techniques to help reduce and manage stress (i.e. Deep breathing, progressive muscle relaxation, and visualization). Balance handout provided to take home. Written material given at graduation. Flowsheet Row Cardiac Rehab from 07/11/2022 in [facility] Cardiac and Pulmonary Rehab  Date 07/11/22  Educator KW  Instruction Review Code 1- Verbalizes Understanding       Activity Barriers & Risk Stratification:  Activity Barriers & Cardiac Risk Stratification - 05/30/22 1531       Activity Barriers & Cardiac Risk Stratification   Activity Barriers Joint Problems;Deconditioning;Muscular Weakness;Shortness of Breath;Other (comment)    Comments Neuropathy, Edema, PAD    Cardiac Risk Stratification High  6 Minute Walk:  6 Minute Walk     Row Name 05/30/22 1529         6 Minute Walk   Phase Initial     Distance 1075 feet     Walk Time 6 minutes     # of Rest Breaks 0     MPH 2.04     METS 2.86     RPE 13     Perceived Dyspnea  3     VO2 Peak 10     Symptoms Yes (comment)     Comments SOB, hip, knee, foot pain/numbness     Resting HR 70 bpm     Resting BP 100/52     Resting Oxygen Saturation  99 %     Exercise Oxygen Saturation  during 6 min walk 100 %     Max Ex. HR 98 bpm     Max Ex. BP 114/64     2 Minute Post BP 106/58              Oxygen Initial Assessment:   Oxygen Re-Evaluation:   Oxygen Discharge (Final Oxygen Re-Evaluation):   Initial Exercise Prescription:  Initial Exercise Prescription - 05/30/22 1500       Date of Initial Exercise RX and  Referring Provider   Date 05/30/22    Referring Provider Ernestina Penna MD      Oxygen   Maintain Oxygen Saturation 88% or higher      Treadmill   MPH 2    Grade 0    Minutes 15    METs 2.53      REL-XR   Level 1    Speed 50    Minutes 15    METs 2.86      T5 Nustep   Level 1    SPM 80    Minutes 15    METs 2.86      Prescription Details   Frequency (times per week) 3    Duration Progress to 30 minutes of continuous aerobic without signs/symptoms of physical distress      Intensity   THRR 40-80% of Max Heartrate 108-146    Ratings of Perceived Exertion 11-13    Perceived Dyspnea 0-4      Progression   Progression Continue to progress workloads to maintain intensity without signs/symptoms of physical distress.      Resistance Training   Training Prescription Yes    Weight 4 lb    Reps 10-15             Perform Capillary Blood Glucose checks as needed.  Exercise Prescription Changes:   Exercise Prescription Changes     Row Name 05/30/22 1500 06/19/22 1500 07/16/22 1400         Response to Exercise   Blood Pressure (Admit) 100/52 110/64 110/60     Blood Pressure (Exercise) 114/64 118/72 134/70     Blood Pressure (Exit) 106/58 108/62 104/64     Heart Rate (Admit) 70 bpm 74 bpm 87 bpm     Heart Rate (Exercise) 98 bpm 137 bpm 128 bpm     Heart Rate (Exit) 69 bpm 99 bpm 98 bpm     Oxygen Saturation (Admit) 99 % -- --     Oxygen Saturation (Exercise) 100 % -- --     Rating of Perceived Exertion (Exercise) _0 Perceived Dyspnea (Exercise) 3 -- --     Symptoms SOB, Hip, knee, feet pain/tightness -- --  Comments 6MWT Results 5th full day of exercise --     Duration -- Continue with 30 min of aerobic exercise without signs/symptoms of physical distress. Continue with 30 min of aerobic exercise without signs/symptoms of physical distress.     Intensity -- THRR unchanged THRR unchanged       Progression   Progression -- Continue to progress  workloads to maintain intensity without signs/symptoms of physical distress. Continue to progress workloads to maintain intensity without signs/symptoms of physical distress.     Average METs -- 2.81 3.57       Resistance Training   Training Prescription -- Yes Yes     Weight -- 4 lb 4 lb     Reps -- 10-15 10-15       Interval Training   Interval Training -- No No       Treadmill   MPH -- 2 2.5     Grade -- 0.5 4     Minutes -- 15 15     METs -- 2.67 4.29       REL-XR   Level -- 2 11     Minutes -- 15 15     METs -- 3.5 6.1       T5 Nustep   Level -- 1 4     Minutes -- 15 15     METs -- 2.77 2.9       Oxygen   Maintain Oxygen Saturation -- 88% or higher 88% or higher              Exercise Comments:   Exercise Comments     Row Name 05/31/22 1534           Exercise Comments First full day of exercise!  Patient was oriented to gym and equipment including functions, settings, policies, and procedures.  Patient's individual exercise prescription and treatment plan were reviewed.  All starting workloads were established based on the results of the 6 minute walk test done at initial orientation visit.  The plan for exercise progression was also introduced and progression will be customized based on patient's performance and goals.                Exercise Goals and Review:   Exercise Goals     Row Name 05/30/22 1546             Exercise Goals   Increase Physical Activity Yes       Intervention Develop an individualized exercise prescription for aerobic and resistive training based on initial evaluation findings, risk stratification, comorbidities and participant's personal goals.;Provide advice, education, support and counseling about physical activity/exercise needs.       Expected Outcomes Short Term: Attend rehab on a regular basis to increase amount of physical activity.;Long Term: Add in home exercise to make exercise part of routine and to increase  amount of physical activity.;Long Term: Exercising regularly at least 3-5 days a week.       Increase Strength and Stamina Yes       Intervention Provide advice, education, support and counseling about physical activity/exercise needs.;Develop an individualized exercise prescription for aerobic and resistive training based on initial evaluation findings, risk stratification, comorbidities and participant's personal goals.       Expected Outcomes Short Term: Increase workloads from initial exercise prescription for resistance, speed, and METs.;Short Term: Perform resistance training exercises routinely during rehab and add in resistance training at home;Long Term: Improve cardiorespiratory fitness, muscular endurance and strength as measured  by increased METs and functional capacity (6MWT)       Able to understand and use rate of perceived exertion (RPE) scale Yes       Intervention Provide education and explanation on how to use RPE scale       Expected Outcomes Short Term: Able to use RPE daily in rehab to express subjective intensity level;Long Term:  Able to use RPE to guide intensity level when exercising independently       Able to understand and use Dyspnea scale Yes       Intervention Provide education and explanation on how to use Dyspnea scale       Expected Outcomes Long Term: Able to use Dyspnea scale to guide intensity level when exercising independently;Short Term: Able to use Dyspnea scale daily in rehab to express subjective sense of shortness of breath during exertion       Knowledge and understanding of Target Heart Rate Range (THRR) Yes       Intervention Provide education and explanation of THRR including how the numbers were predicted and where they are located for reference       Expected Outcomes Long Term: Able to use THRR to govern intensity when exercising independently;Short Term: Able to state/look up THRR;Short Term: Able to use daily as guideline for intensity in rehab        Able to check pulse independently Yes       Intervention Review the importance of being able to check your own pulse for safety during independent exercise;Provide education and demonstration on how to check pulse in carotid and radial arteries.       Expected Outcomes Short Term: Able to explain why pulse checking is important during independent exercise;Long Term: Able to check pulse independently and accurately       Understanding of Exercise Prescription Yes       Intervention Provide education, explanation, and written materials on patient's individual exercise prescription       Expected Outcomes Short Term: Able to explain program exercise prescription;Long Term: Able to explain home exercise prescription to exercise independently                Exercise Goals Re-Evaluation :  Exercise Goals Re-Evaluation     Row Name 05/31/22 1534 06/19/22 1521 07/16/22 1421         Exercise Goal Re-Evaluation   Exercise Goals Review Able to understand and use rate of perceived exertion (RPE) scale;Able to understand and use Dyspnea scale;Knowledge and understanding of Target Heart Rate Range (THRR);Understanding of Exercise Prescription Increase Physical Activity;Increase Strength and Stamina;Understanding of Exercise Prescription Increase Physical Activity;Increase Strength and Stamina;Understanding of Exercise Prescription     Comments Reviewed RPE scale, THR and program prescription with pt today.  Pt voiced understanding and was given a copy of goals to take home. Deondray is doing well for the first couple of weeks that he has been here. He has been able to follow his initial exercise prescription well, and even able to add a 0.5% incline to his treadmill workload. He also increased to level 2 on the XR. We will continue to monitor as he progresses in our program. Geofrey continues to do well. Since last review, patient has increased on all his workloads on each exercise machine. He is up to level 11  on the XR, working over 6 METS!! His incline on the treadmill increased to 4%. The T5 Nustep is now up to level 4. He continues to hit his  THR each session. We will continue to monitor.     Expected Outcomes Short: Use RPE daily to regulate intensity.  Long: Follow program prescription in THR. Short: Continue to increase workload on treadmill Long: Continue to improve overall strength and stamina Short: Increase handweights to 5 lb Long: Continue to increase overall MET level              Discharge Exercise Prescription (Final Exercise Prescription Changes):  Exercise Prescription Changes - 07/16/22 1400       Response to Exercise   Blood Pressure (Admit) 110/60    Blood Pressure (Exercise) 134/70    Blood Pressure (Exit) 104/64    Heart Rate (Admit) 87 bpm    Heart Rate (Exercise) 128 bpm    Heart Rate (Exit) 98 bpm    Rating of Perceived Exertion (Exercise) 15    Duration Continue with 30 min of aerobic exercise without signs/symptoms of physical distress.    Intensity THRR unchanged      Progression   Progression Continue to progress workloads to maintain intensity without signs/symptoms of physical distress.    Average METs 3.57      Resistance Training   Training Prescription Yes    Weight 4 lb    Reps 10-15      Interval Training   Interval Training No      Treadmill   MPH 2.5    Grade 4    Minutes 15    METs 4.29      REL-XR   Level 11    Minutes 15    METs 6.1      T5 Nustep   Level 4    Minutes 15    METs 2.9      Oxygen   Maintain Oxygen Saturation 88% or higher             Nutrition:  Target Goals: Understanding of nutrition guidelines, daily intake of sodium <156m, cholesterol <2071m calories 30% from fat and 7% or less from saturated fats, daily to have 5 or more servings of fruits and vegetables.  Education: All About Nutrition: -Group instruction provided by verbal, written material, interactive activities, discussions, models, and  posters to present general guidelines for heart healthy nutrition including fat, fiber, MyPlate, the role of sodium in heart healthy nutrition, utilization of the nutrition label, and utilization of this knowledge for meal planning. Follow up email sent as well. Written material given at graduation. [facility]  Date 07/[facility]  [facility] 1- Verbalizes Understanding       Biometrics:  Pre Biometrics - 05/30/22 1547       Pre Biometrics   Height 6' 0.25" (1.835 m)    Weight 254 lb 14.4 oz (115.6 kg)    Waist Circumference 59.5 inches    Hip Circumference 43 inches    Waist to Hip Ratio 1.38 %    BMI (Calculated) 34.34    Single Leg Stand 26.2 seconds   Right             Nutrition Therapy Plan and Nutrition Goals:  Nutrition Therapy & Goals - 05/30/22 1649       Personal Nutrition Goals   Comments [place] feeling overwhelmed with his hospitalization this summer, quitting smoking, 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 Sani reports quitting smoking cold [place] was hard, but he was not interested in interventions such as the patch due to potential side effects. He reports this was easier in the hospital due to less temptation and now has difficulty on a regular basis; after eating he will want to smoke, but instead will continue eating to curb this. Brent reports he tried to take care of himself by being an avid Engineer, manufacturing on various subjects such as nutrition in addition to meeting with an RD at the hospital as well as in the outpatient setting; he feels he knows how to be healthy, it is just a matter of doing it. This RD briefly reviewed some tactics to save on time and money while trying to eat healthy. Jamus reports sleeping during the morning hours as his son  is at school and his father has an aide to help him so he is able to relax and sleep; he does not end up going to sleep until around 5-7am and will sleep until about 2pm. When he takes his medication he tries to have something in his stomach like a banana. He has his biggest meal in the evening around 7pm where he will have some protein such as pork chops, steak, or chicken with some vegetables (he does not care for frozen and will try to buy many fresh) and potentially a carbohydrate rich food like rice or starchy vegetables; he reports that this meal may be consisdered to be multiple portions by other people - reviewed meal structure such as being mindful of CHO quantity in one sitting. He reports not using fat such as oil or butter with cooking, not eating fried foods, and trying to eat balanced meals using a Sanjuana Letters. Snacks will vary with choices such as fruit, ice cream, cookies, and vanilla waffers. He reports drinking water throughout the day. Encouraged Moss to attend the educational sessions as he is able to and pointed out the nutrition education was in November. Devinn reports that his preference is to not receive physical copies of handouts, provided him with youtube channel and sent email with informational handouts on nutrition. Since meeting with this RD he reported not going to outpatient RD, but he still RD information so that he can contact them when needed especially post cardiac rehab.             Nutrition Assessments:  MEDIFICTS Score Key: ?70 Need to make dietary changes  40-70 Heart Healthy Diet ? 40 Therapeutic Level Cholesterol Diet  Flowsheet Row Cardiac Rehab from 05/30/2022 in [facility] Cardiac and Pulmonary Rehab  Picture Your Plate Total Score on Admission 55      Picture Your Plate Scores: <67 Unhealthy dietary pattern with much room for improvement. 41-50 Dietary pattern unlikely to meet recommendations for good health and room for improvement. 51-60  More healthful dietary pattern, with some room for improvement.  >60 Healthy dietary pattern, although there may be some specific behaviors that could be improved.    Nutrition Goals Re-Evaluation:  Nutrition Goals Re-Evaluation     [place] Name 07/05/22 1555             Goals   Current Weight 261 lb (118.4 kg)       Nutrition Goal Cut back on portions.       Comment Davi eats out anywhere from 2-3 times a week depending when his son is with him. He wants to lose more weight but has appointments  and his schedule keeps him from eating regularly.       Expected Outcome Short: eat smaller potions and lose some weight. Long: adhere to a diet that pertains to him.                Nutrition Goals Discharge (Final Nutrition Goals Re-Evaluation):  Nutrition Goals Re-Evaluation - 07/05/22 1555       Goals   Current Weight 261 lb (118.4 kg)    Nutrition Goal Cut back on portions.    Comment Izen eats out anywhere from 2-3 times a week depending when his son is with him. He wants to lose more weight but has appointments and his schedule keeps him from eating regularly.    Expected Outcome Short: eat smaller potions and lose some weight. Long: adhere to a diet that pertains to him.             Psychosocial: Target Goals: Acknowledge presence or absence of significant depression and/or stress, maximize coping skills, provide positive support system. Participant is able to verbalize types and ability to use techniques and skills needed for reducing stress and depression.   Education: Stress, Anxiety, and Depression - Group verbal and visual presentation to define topics covered.  Reviews how body is impacted by stress, anxiety, and depression.  Also discusses healthy ways to reduce stress and to treat/manage anxiety and depression.  Written material given at graduation.   Education: Sleep Hygiene -Provides group verbal and written instruction about how sleep can affect your health.   Define sleep hygiene, discuss sleep cycles and impact of sleep habits. Review good sleep hygiene tips.    Initial Review & Psychosocial Screening:  Initial Psych Review & Screening - 05/21/22 1119       Initial Review   Current issues with Current Sleep Concerns;Current Psychotropic Meds;History of Depression;Current Depression;Current Anxiety/Panic;Current Stress Concerns    Source of Stress Concerns Chronic Illness;Family    Comments Zaylen is taking care of his dad routnely and lost track of his own health. He was in and out of court for custody and child support in the past. He has not seen a doctor since the [place] and was the last time he had insurance. His dad and son live with him. They are all living off of his fathers social securtiy. He states taking care of his father has drained him financially, phyically and mentaly.      Family Dynamics   Good Support System? No    Strains Intra-family strains;Illness and family care strain      Barriers   Psychosocial barriers to participate in program The patient should benefit from training in stress management and relaxation.      Screening Interventions   Interventions Encouraged to exercise;To provide support and resources with identified psychosocial needs;Provide feedback about the scores to participant    Expected Outcomes Short Term goal: Utilizing psychosocial counselor, staff and physician to assist with identification of specific Stressors or current issues interfering with healing process. Setting desired goal for each stressor or current issue identified.;Long Term Goal: Stressors or current issues are controlled or eliminated.;Short Term goal: Identification and review with participant of any Quality of Life or Depression concerns found by scoring the questionnaire.;Long Term goal: The participant improves quality of Life and PHQ9 Scores as seen by post scores and/or verbalization of changes             Quality of Life  Scores:   Quality of Life - 05/30/22 1549  Quality of Life   Select Quality of Life      Quality of Life Scores   Health/Function Pre 11.1 %    Socioeconomic Pre 15.5 %    Psych/Spiritual Pre 7.29 %    Family Pre 10.2 %    GLOBAL Pre 11.21 %            Scores of 19 and below usually indicate a poorer quality of life in these areas.  A difference of  2-3 points is a clinically meaningful difference.  A difference of 2-3 points in the total score of the Quality of Life Index has been associated with significant improvement in overall quality of life, self-image, physical symptoms, and general health in studies assessing change in quality of life.  PHQ-9: Review Flowsheet       06/25/2022 05/30/2022 05/11/2022 05/03/2022  Depression screen PHQ 2/9  Decreased Interest 1 2 0 0  Down, Depressed, Hopeless _0 0  PHQ - 2 Score _1 0  Altered sleeping _2 -  Tired, decreased energy _3 -  Change in appetite _4 -  Feeling bad or failure about yourself  _5 -  Trouble concentrating _6 -  Moving slowly or fidgety/restless 0 2 1 -  Suicidal thoughts 0 0 0 -  PHQ-9 Score _7 -  Difficult doing work/chores Very difficult Very difficult Extremely dIfficult -   Interpretation of Total Score  Total Score Depression Severity:  1-4 = Minimal depression, 5-9 = Mild depression, 10-14 = Moderate depression, 15-19 = Moderately severe depression, 20-27 = Severe depression   Psychosocial Evaluation and Intervention:  Psychosocial Evaluation - 05/21/22 1124       Psychosocial Evaluation & Interventions   Interventions Encouraged to exercise with the program and follow exercise prescription;Stress management education;Relaxation education    Comments Joshawn is taking care of his dad routnely and lost track of his own health. He was in and out of court for custody and child support in the past. He has not seen a doctor since the [place] and was the last time he had  insurance. His dad and son live with him. They are all living off of his fathers social securtiy. He states taking care of his father has drained him financially, phyically and mentaly.    Expected Outcomes Short: Start HeartTrack to help with mood. Long: Maintain a healthy mental state    Continue Psychosocial Services  Follow up required by staff             Psychosocial Re-Evaluation:  Psychosocial Re-Evaluation     [place] Name 07/05/22 1548             Psychosocial Re-Evaluation   Current issues with Current Depression;History of Depression;Current Anxiety/Panic;Current Psychotropic Meds;Current Sleep Concerns;Current Stress Concerns       Comments Jarrod is not sleeping well and he feels more anxiety. Alot of his anxiety is just "there". His dads health is not doing well and he is the primary care giver. He has been in and out of court for custody of his son in the past. He has not been able to have a life for himself due to his dad living with him for 11 years.       Expected Outcomes Short: Attend HeartTrack stress management education to decrease stress. Long: Maintain exercise Post HeartTrack to keep stress at a minimum.       Interventions  Encouraged to attend Cardiac Rehabilitation for the exercise       Continue Psychosocial Services  Follow up required by staff                Psychosocial Discharge (Final Psychosocial Re-Evaluation):  Psychosocial Re-Evaluation - 07/05/22 1548       Psychosocial Re-Evaluation   Current issues with Current Depression;History of Depression;Current Anxiety/Panic;Current Psychotropic Meds;Current Sleep Concerns;Current Stress Concerns    Comments Sollie is not sleeping well and he feels more anxiety. Alot of his anxiety is just "there". His dads health is not doing well and he is the primary care giver. He has been in and out of court for custody of his son in the past. He has not been able to have a life for himself due to his dad living  with him for 11 years.    Expected Outcomes Short: Attend HeartTrack stress management education to decrease stress. Long: Maintain exercise Post HeartTrack to keep stress at a minimum.    Interventions Encouraged to attend Cardiac Rehabilitation for the exercise    Continue Psychosocial Services  Follow up required by staff             Vocational Rehabilitation: Provide vocational rehab assistance to qualifying candidates.   Vocational Rehab Evaluation & Intervention:   Education: Education Goals: Education classes will be provided on a variety of topics geared toward better understanding of heart health and risk factor modification. Participant will state understanding/return demonstration of topics presented as noted by education test scores.  Learning Barriers/Preferences:  Learning Barriers/Preferences - 05/21/22 1115       Learning Barriers/Preferences   Learning Barriers None    Learning Preferences None             General Cardiac Education Topics:  AED/CPR: - Group verbal and written instruction with the use of models to demonstrate the basic use of the AED with the basic ABC's of resuscitation.   Anatomy and Cardiac Procedures: - Group verbal and visual presentation and models provide information about basic cardiac anatomy and function. Reviews the testing methods done to diagnose heart disease and the outcomes of the test results. Describes the treatment choices: Medical Management, Angioplasty, or Coronary Bypass Surgery for treating various heart conditions including Myocardial Infarction, Angina, Valve Disease, and Cardiac Arrhythmias.  Written material given at graduation. Flowsheet Row Cardiac Rehab from [facility]/11/2022 in [facility] Card[facility] Rehab  Education need identified 05/30/22       Medication Safety: - Group verbal and visual instruction to review commonly prescribed medications for heart and lung disease. Reviews the medication, class of  the drug, and side effects. Includes the steps to properly store meds and maintain the prescription regimen.  Written material given at graduation.   Intimacy: - Group verbal instruction through game format to discuss how heart and lung disease can affect sexual intimacy. Written material given at graduation.. Flowsheet Row Cardiac Rehab from 07/11/2022 in [facility] Cardiac and Pulmonary Rehab  Date 06/27/22  Educator [place]  Instruction Review Code 1- Verbalizes Understanding       Know Your Numbers and Heart Failure: - Group verbal and visual instruction to discuss disease risk factors for cardiac and pulmonary disease and treatment options.  Reviews associated critical values for Overweight/Obesity, Hypertension, Cholesterol, and Diabetes.  Discusses basics of heart failure: signs/symptoms and treatments.  Introduces Heart Failure Zone chart for action plan for heart failure.  Written material given at graduation.   Infection Prevention: - Provides verbal  and written material to individual with discussion of infection control including proper hand washing and proper equipment cleaning during exercise session. Flowsheet Row Cardiac Rehab from 07/11/2022 in [facility] Cardiac and Pulmonary Rehab  Date 05/21/22  Educator [facility]  Instruction Review Code 1- Verbalizes Understanding       Falls Prevention: - Provides verbal and written material to individual with discussion of falls prevention and safety. Flowsheet Row Cardiac Rehab from 07/11/2022 in [facility] Cardiac and Pulmonary Rehab  Date 05/21/22  Educator [facility]  Instruction Review Code 1- Verbalizes Understanding       Other: -Provides group and verbal instruction on various topics (see comments)   Knowledge Questionnaire Score:  Knowledge Questionnaire Score - 05/30/22 1558       Knowledge Questionnaire Score   Pre Score 23/26             Core Components/Risk Factors/Patient Goals at Admission:  Personal Goals and Risk Factors at  Admission - 05/30/22 1558       Core Components/Risk Factors/Patient Goals on Admission    Weight Management Yes;Weight Loss    Intervention Weight Management: Develop a combined nutrition and exercise program designed to reach desired caloric intake, while maintaining appropriate intake of nutrient and fiber, sodium and fats, and appropriate energy expenditure required for the weight goal.;Weight Management: Provide education and appropriate resources to help participant work on and attain dietary goals.;Weight Management/Obesity: Establish reasonable short term and long term weight goals.    Admit Weight 254 lb 14.4 oz (115.6 kg)    Goal Weight: Short Term 250 lb (113.4 kg)    Goal Weight: Long Term 240 lb (108.9 kg)    Expected Outcomes Short Term: Continue to assess and modify interventions until short term weight is achieved;Long Term: Adherence to nutrition and physical activity/exercise program aimed toward attainment of established weight goal;Weight Loss: Understanding of general recommendations for a balanced deficit meal plan, which promotes 1-2 lb weight loss per week and includes a negative energy balance of [phone] kcal/d;Understanding recommendations for meals to include 15-35% energy as protein, 25-35% energy from fat, 35-60% energy from carbohydrates, less than 211m of dietary cholesterol, 20-35 gm of total fiber daily;Understanding of distribution of calorie intake throughout the day with the consumption of 4-5 meals/snacks    Tobacco Cessation Yes    Number of packs per day 0    Intervention Assist the participant in steps to quit. Provide individualized education and counseling about committing to Tobacco Cessation, relapse prevention, and pharmacological support that can be provided by physician.;OAdvice worker assist with locating and accessing local/national Quit Smoking programs, and support quit date choice.    Expected Outcomes Short Term: Will demonstrate  readiness to quit, by selecting a quit date.;Short Term: Will quit all tobacco product use, adhering to prevention of relapse plan.;Long Term: Complete abstinence from all tobacco products for at least 12 months from quit date.    Diabetes Yes    Intervention Provide education about signs/symptoms and action to take for hypo/hyperglycemia.;Provide education about proper nutrition, including hydration, and aerobic/resistive exercise prescription along with prescribed medications to achieve blood glucose in normal ranges: Fasting glucose 65-99 mg/dL    Expected Outcomes Short Term: Participant verbalizes understanding of the signs/symptoms and immediate care of hyper/hypoglycemia, proper foot care and importance of medication, aerobic/resistive exercise and nutrition plan for blood glucose control.;Long Term: Attainment of HbA1C < 7%.    Heart Failure Yes    Intervention Provide a combined exercise and nutrition program that is  supplemented with education, support and counseling about heart failure. Directed toward relieving symptoms such as shortness of breath, decreased exercise tolerance, and extremity edema.    Expected Outcomes Improve functional capacity of life;Short term: Attendance in program 2-3 days a week with increased exercise capacity. Reported lower sodium intake. Reported increased fruit and vegetable intake. Reports medication compliance.;Short term: Daily weights obtained and reported for increase. Utilizing diuretic protocols set by physician.;Long term: Adoption of self-care skills and reduction of barriers for early signs and symptoms recognition and intervention leading to self-care maintenance.    Hypertension Yes    Intervention Provide education on lifestyle modifcations including regular physical activity/exercise, weight management, moderate sodium restriction and increased consumption of fresh fruit, vegetables, and low fat dairy, alcohol moderation, and smoking cessation.;Monitor  prescription use compliance.    Expected Outcomes Short Term: Continued assessment and intervention until BP is < 140/59m HG in hypertensive participants. < 130/837mHG in hypertensive participants with diabetes, heart failure or chronic kidney disease.;Long Term: Maintenance of blood pressure at goal levels.    Lipids Yes    Intervention Provide education and support for participant on nutrition & aerobic/resistive exercise along with prescribed medications to achieve LDL <7030mHDL >8m51m  Expected Outcomes Long Term: Cholesterol controlled with medications as prescribed, with individualized exercise RX and with personalized nutrition plan. Value goals: LDL < 70mg39mL > 40 mg.;Short Term: Participant states understanding of desired cholesterol values and is compliant with medications prescribed. Participant is following exercise prescription and nutrition guidelines.             Education:Diabetes - Individual verbal and written instruction to review signs/symptoms of diabetes, desired ranges of glucose level fasting, after meals and with exercise. Acknowledge that pre and post exercise glucose checks will be done for 3 sessions at entry of program. [place] [facility]  Date 05/21/22  Educator [facility]ruction Review Code 1- Verbalizes Understanding       Core Components/Risk Factors/Patient Goals Review:   Goals and Risk Factor Review     Row Name 07/05/22 1553             Core Components/Risk Factors/Patient Goals Review   Personal Goals Review Tobacco Cessation       Review DavidBrandoml be 6 months sober from tobacco in 2 days. He has quit [place] continue to be tobacco free.       Expected Outcomes Short: quit smoking. Long: remain tobacco free.                Core Components/Risk Factors/Patient Goals at Discharge (Final Review):   Goals and Risk Factor Review - 07/05/22 1553       Core  Components/Risk Factors/Patient Goals Review   Personal Goals Review Tobacco Cessation    Review DavidKandonl be 6 months sober from tobacco in 2 days. He has quit [place] continue to be tobacco free.    Expected Outcomes Short: quit smoking. Long: remain tobacco free.             ITP Comments:  ITP Comments     Row Name 05/21/22 1113 05/30/22 1529 05/31/22 1533 06/07/22 1641 06/20/22 0916   ITP Comments Virtual Visit completed. Patient informed on EP and RD appointment and 6 Minute walk test. Patient also informed of patient health questionnaires on My Chart. Patient Verbalizes understanding. [facility]. Completed 6MWT and gym orientation. Initial ITP created and sent for review to Dr. Emily Filbert, {Medical Director. First full day of exercise!  Patient was oriented to gym and equipment including functions, settings, policies, and procedures.  Patient's individual exercise prescription and treatment plan were reviewed.  All starting workloads were established based on the results of the 6 minute walk test done at initial orientation visit.  The plan for exercise progression was also introduced and progression will be customized based on patient's performance and goals. patient states that he is hurting in his lungs and upper back and all around his upper body. He states it is not a soreness. Listened to patients lung sounds, all his anterior lobes have crackles and ronchi. Posterior is more clear with crackles in the bases. He states that he is taking his lasix but is up 6 pounds since Monday. Informed him to call his doctor in the morning and watch his weight. 30 Day review completed. Medical Director ITP review done, changes made as directed, and signed approval by Medical Director. NEW TO PROGRAM    Row Name 07/18/22 0745           ITP Comments 30 Day review completed. Medical Director ITP review done, changes made as directed, and signed  approval by Medical Director.                Comments:

## 2022-07-18 NOTE — Progress Notes (Signed)
Daily Session Note  Patient Details  Name: Lawrence Murray MRN: [id number] Date of Birth: 10-14-1965 Referring Provider:   Flowsheet Row Cardiac Rehab from 05/30/2022 in [facility] Cardiac and Pulmonary Rehab  Referring Provider Ernestina Penna MD       Encounter Date: 07/18/2022  Check In:  Session Check In - 07/18/22 1548       Check-In   Supervising physician immediately available to respond to emergencies See telemetry face sheet for immediately available ER MD    Location [facility]-Cardiac & Pulmonary Rehab    Staff Present Renita Papa, RN Moises Blood, BS, ACSM CEP, Exercise Physiologist;Joseph Tessie Fass, [place]    Virtual Visit No    Medication changes reported     No    Fall or balance concerns reported    No    Warm-up and Cool-down Performed on first and last piece of equipment    Resistance Training Performed Yes    VAD Patient? No    PAD/SET Patient? No      Pain Assessment   Currently in Pain? No/denies                Social History   Tobacco Use  Smoking Status Former   Packs/day: 1.00   Years: 23.00   Total pack years: 23.00   Types: Cigarettes   Quit date: 02/04/2022   Years since quitting: 0.4  Smokeless Tobacco Never    Goals Met:  Independence with exercise equipment Exercise tolerated well No report of concerns or symptoms today Strength training completed today  Goals Unmet:  Not Applicable  Comments: Pt able to follow exercise prescription today without complaint.  Will continue to monitor for progression.    Dr. Emily Filbert is Medical Director for [place].  Dr. Ottie Glazier is Medical Director for [facility] Pulmonary Rehabilitation.

## 2022-07-19 ENCOUNTER — Encounter: Payer: Medicaid Other | Admitting: *Deleted

## 2022-07-19 ENCOUNTER — Ambulatory Visit: Payer: Medicaid Other | Attending: Medical

## 2022-07-19 DIAGNOSIS — I5043 Acute on chronic combined systolic (congestive) and diastolic (congestive) heart failure: Secondary | ICD-10-CM

## 2022-07-19 DIAGNOSIS — I5042 Chronic combined systolic (congestive) and diastolic (congestive) heart failure: Secondary | ICD-10-CM

## 2022-07-19 LAB — ECHOCARDIOGRAM COMPLETE
Area-P 1/2: 5.38 cm2
Calc EF: 32.2 %
S' Lateral: 4.7 cm
Single Plane A2C EF: 28.9 %
Single Plane A4C EF: 36.8 %

## 2022-07-19 MED ORDER — PERFLUTREN LIPID MICROSPHERE
1.0000 mL | INTRAVENOUS | Status: AC | PRN
  Administered 2022-07-19: 4 mL via INTRAVENOUS

## 2022-07-19 NOTE — Progress Notes (Signed)
Daily Session Note  Patient Details  Name: Lawrence Murray MRN: [id number] Date of Birth: May 23, 1966 Referring Provider:   Flowsheet Row Cardiac Rehab from 05/30/2022 in [facility] Cardiac and Pulmonary Rehab  Referring Provider Ernestina Penna MD       Encounter Date: 07/19/2022  Check In:  Session Check In - 07/19/22 1542       Check-In   Supervising physician immediately available to respond to emergencies See telemetry face sheet for immediately available ER MD    Location [facility]-Cardiac & Pulmonary Rehab    Staff Present Renita Papa, RN BSN;Joseph Liberty, RCP,RRT,BSRT;Noah Tickle, BS, Exercise Physiologist;Deborah Dondero [place], [place], RCEP, CCRP, CCET    Virtual Visit No    Medication changes reported     No    Fall or balance concerns reported    No    Warm-up and Cool-down Performed on first and last piece of equipment    Resistance Training Performed Yes    VAD Patient? No    PAD/SET Patient? No      Pain Assessment   Currently in Pain? No/denies                Social History   Tobacco Use  Smoking Status Former   Packs/day: 1.00   Years: 23.00   Total pack years: 23.00   Types: Cigarettes   Quit date: 02/04/2022   Years since quitting: 0.4  Smokeless Tobacco Never    Goals Met:  Proper associated with RPD/PD & O2 Sat Independence with exercise equipment Exercise tolerated well No report of concerns or symptoms today Strength training completed today  Goals Unmet:  Not Applicable  Comments: Pt able to follow exercise prescription today without complaint.  Will continue to monitor for progression.    Dr. Emily Filbert is Medical Director for [place].  Dr. Ottie Glazier is Medical Director for [facility] Pulmonary Rehabilitation.

## 2022-07-23 ENCOUNTER — Other Ambulatory Visit: Payer: Self-pay | Admitting: Medical

## 2022-07-23 ENCOUNTER — Telehealth: Payer: Self-pay | Admitting: Cardiology

## 2022-07-23 ENCOUNTER — Other Ambulatory Visit: Payer: Self-pay

## 2022-07-23 ENCOUNTER — Encounter: Payer: Medicaid Other | Attending: Cardiology | Admitting: *Deleted

## 2022-07-23 DIAGNOSIS — I5189 Other ill-defined heart diseases: Secondary | ICD-10-CM

## 2022-07-23 DIAGNOSIS — I5022 Chronic systolic (congestive) heart failure: Secondary | ICD-10-CM | POA: Insufficient documentation

## 2022-07-23 DIAGNOSIS — I519 Heart disease, unspecified: Secondary | ICD-10-CM | POA: Diagnosis not present

## 2022-07-23 DIAGNOSIS — I5042 Chronic combined systolic (congestive) and diastolic (congestive) heart failure: Secondary | ICD-10-CM | POA: Diagnosis present

## 2022-07-23 MED FILL — Gabapentin Cap 100 MG: ORAL | 20 days supply | Qty: 60 | Fill #1 | Status: CN

## 2022-07-23 NOTE — Telephone Encounter (Signed)
Patient is requesting a status of his test results. Best time for tomorrow to call is between 12-2pm ok to leave message

## 2022-07-23 NOTE — Progress Notes (Signed)
Daily Session Note  Patient Details  Name: Lawrence Murray MRN: [id number] Date of Birth: Aug 10, 1966 Referring Provider:   Flowsheet Row Cardiac Rehab from 05/30/2022 in [facility] Cardiac and Pulmonary Rehab  Referring Provider Ernestina Penna MD       Encounter Date: 07/23/2022  Check In:  Session Check In - 07/23/22 1533       Check-In   Supervising physician immediately available to respond to emergencies See telemetry face sheet for immediately available ER MD    Location [facility]-Cardiac & Pulmonary Rehab    Staff Present Darlyne Russian, RN, Dimple Nanas, BS, Exercise Physiologist;Joseph Tessie Fass, [place]    Virtual Visit No    Medication changes reported     No    Fall or balance concerns reported    No    Warm-up and Cool-down Performed on first and last piece of equipment    Resistance Training Performed Yes    VAD Patient? No    PAD/SET Patient? No      PAD/SET Patient   Completed foot check today? No      Pain Assessment   Currently in Pain? No/denies                Social History   Tobacco Use  Smoking Status Former   Packs/day: 1.00   Years: 23.00   Total pack years: 23.00   Types: Cigarettes   Quit date: 02/04/2022   Years since quitting: 0.4  Smokeless Tobacco Never    Goals Met:  Independence with exercise equipment Exercise tolerated well No report of concerns or symptoms today Strength training completed today  Goals Unmet:  Not Applicable  Comments: Pt able to follow exercise prescription today without complaint.  Will continue to monitor for progression.    Dr. Emily Filbert is Medical Director for [place].  Dr. Ottie Glazier is Medical Director for [facility] Pulmonary Rehabilitation.

## 2022-07-24 ENCOUNTER — Other Ambulatory Visit: Payer: Self-pay

## 2022-07-24 DIAGNOSIS — E119 Type 2 diabetes mellitus without complications: Secondary | ICD-10-CM | POA: Diagnosis not present

## 2022-07-24 LAB — HM DIABETES EYE EXAM

## 2022-07-24 MED ORDER — ATORVASTATIN CALCIUM 80 MG PO TABS
80.0000 mg | ORAL_TABLET | Freq: Every day | ORAL | 2 refills | Status: DC
  Filled 2022-07-24: qty 90, 90d supply, fill #0
  Filled 2022-10-15: qty 90, 90d supply, fill #1
  Filled 2023-01-15: qty 90, 90d supply, fill #2

## 2022-07-24 MED ORDER — FUROSEMIDE 40 MG PO TABS
60.0000 mg | ORAL_TABLET | Freq: Every day | ORAL | 3 refills | Status: DC
  Filled 2022-07-24 – 2022-08-30 (×2): qty 135, 90d supply, fill #0
  Filled 2022-12-10: qty 135, 90d supply, fill #1
  Filled 2023-03-13: qty 135, 90d supply, fill #2
  Filled 2023-06-10: qty 135, 90d supply, fill #3

## 2022-07-24 MED ORDER — METOPROLOL SUCCINATE ER 25 MG PO TB24
12.5000 mg | ORAL_TABLET | Freq: Every day | ORAL | 2 refills | Status: DC
  Filled 2022-07-24 – 2022-08-17 (×2): qty 45, 90d supply, fill #0

## 2022-07-24 MED ORDER — POTASSIUM CHLORIDE CRYS ER 20 MEQ PO TBCR
20.0000 meq | EXTENDED_RELEASE_TABLET | Freq: Every day | ORAL | 2 refills | Status: DC
  Filled 2022-07-24: qty 90, 90d supply, fill #0
  Filled 2022-10-15: qty 90, 90d supply, fill #1
  Filled 2023-01-13: qty 90, 90d supply, fill #2

## 2022-07-24 MED ORDER — SPIRONOLACTONE 25 MG PO TABS
12.5000 mg | ORAL_TABLET | Freq: Every day | ORAL | 2 refills | Status: DC
  Filled 2022-07-24 – 2022-08-17 (×2): qty 45, 90d supply, fill #0
  Filled 2022-11-12: qty 45, 90d supply, fill #1
  Filled 2023-02-12: qty 45, 90d supply, fill #2

## 2022-07-24 MED ORDER — DIGOXIN 125 MCG PO TABS
0.1250 mg | ORAL_TABLET | Freq: Every day | ORAL | 2 refills | Status: DC
  Filled 2022-07-24: qty 90, 90d supply, fill #0
  Filled 2022-10-15: qty 90, 90d supply, fill #1
  Filled 2023-01-18: qty 90, 90d supply, fill #2

## 2022-07-24 MED ORDER — ASPIRIN 81 MG PO TBEC
81.0000 mg | DELAYED_RELEASE_TABLET | Freq: Every day | ORAL | 2 refills | Status: DC
  Filled 2022-07-24: qty 90, 90d supply, fill #0

## 2022-07-24 MED FILL — Gabapentin Cap 100 MG: ORAL | 20 days supply | Qty: 60 | Fill #1 | Status: AC

## 2022-07-24 NOTE — Telephone Encounter (Signed)
Call transferred to this RN from the call center due to limited phone access from the patient.  He was calling to discuss his echo results.   Cadence Ninfa Meeker, PA-C 07/20/2022  1:02 PM EST     Echo showed pump function 30%, which is the same. Can we refer to EP for ICD discussion?

## 2022-07-24 NOTE — Telephone Encounter (Signed)
Echo results reviewed with the patient.  He is aware of Cadence, PA's recommendations to refer him to EP for possible ICD implant due to LV dysfunction.   The patient voices understanding of these results and is agreeable with the referral.  He would like to try to coordinate this with his Cardiac Rehab appointments due to transportation.  He is also requesting refills for his medications.  He is aware I will review his Cardiac meds for refills. He is requesting a 90-day supply.  He is also going to be near our office today and due to limited phone access and the fact he is getting ready to leave home for another appointment, he will come here later today to try to schedule with our front desk.  The patient was very appreciative of the time spent speaking with him today.

## 2022-07-24 NOTE — Telephone Encounter (Signed)
Patient returned RN's call. 

## 2022-07-25 ENCOUNTER — Encounter: Payer: Medicaid Other | Admitting: *Deleted

## 2022-07-25 DIAGNOSIS — I5022 Chronic systolic (congestive) heart failure: Secondary | ICD-10-CM | POA: Diagnosis not present

## 2022-07-25 DIAGNOSIS — I519 Heart disease, unspecified: Secondary | ICD-10-CM | POA: Diagnosis not present

## 2022-07-25 DIAGNOSIS — I5042 Chronic combined systolic (congestive) and diastolic (congestive) heart failure: Secondary | ICD-10-CM

## 2022-07-25 LAB — GLUCOSE, CAPILLARY: Glucose-Capillary: 167 mg/dL — ABNORMAL HIGH (ref 70–99)

## 2022-07-25 NOTE — Progress Notes (Signed)
Daily Session Note  Patient Details  Name: Lawrence Murray MRN: [id number] Date of Birth: March 02, 1966 Referring Provider:   Flowsheet Row Cardiac Rehab from 05/30/2022 in [facility] Cardiac and Pulmonary Rehab  Referring Provider Ernestina Penna MD       Encounter Date: 07/25/2022  Check In:  Session Check In - 07/25/22 1538       Check-In   Supervising physician immediately available to respond to emergencies See telemetry face sheet for immediately available ER MD    Location [facility]-Cardiac & Pulmonary Rehab    Staff Present Renita Papa, RN Moises Blood, BS, ACSM CEP, Exercise Physiologist;Joseph Tessie Fass, [place]    Virtual Visit No    Medication changes reported     No    Fall or balance concerns reported    No    Warm-up and Cool-down Performed on first and last piece of equipment    Resistance Training Performed Yes    VAD Patient? No    PAD/SET Patient? No      Pain Assessment   Currently in Pain? No/denies                Social History   Tobacco Use  Smoking Status Former   Packs/day: 1.00   Years: 23.00   Total pack years: 23.00   Types: Cigarettes   Quit date: 02/04/2022   Years since quitting: 0.4  Smokeless Tobacco Never    Goals Met:  Independence with exercise equipment Exercise tolerated well No report of concerns or symptoms today Strength training completed today  Goals Unmet:  Not Applicable  Comments: Pt able to follow exercise prescription today without complaint.  Will continue to monitor for progression.    Dr. Emily Filbert is Medical Director for [facility].  Dr. Ottie Glazier is Medical Director for [facility] Pulmonary Rehabilitation.

## 2022-07-26 ENCOUNTER — Other Ambulatory Visit: Payer: Self-pay

## 2022-07-26 ENCOUNTER — Encounter: Payer: Medicaid Other | Admitting: *Deleted

## 2022-07-26 DIAGNOSIS — I5042 Chronic combined systolic (congestive) and diastolic (congestive) heart failure: Secondary | ICD-10-CM

## 2022-07-26 DIAGNOSIS — I519 Heart disease, unspecified: Secondary | ICD-10-CM | POA: Diagnosis not present

## 2022-07-26 DIAGNOSIS — I5022 Chronic systolic (congestive) heart failure: Secondary | ICD-10-CM | POA: Diagnosis not present

## 2022-07-26 NOTE — Progress Notes (Signed)
Daily Session Note  Patient Details  Name: Lawrence Murray MRN: [id number] Date of Birth: [birth year] Referring Provider:   Flowsheet Row Cardiac Rehab from 05/30/2022 in [facility] Cardiac and Pulmonary Rehab  Referring Provider Ernestina Penna MD       Encounter Date: 07/26/2022  Check In:  Session Check In - 07/26/22 1550       Check-In   Supervising physician immediately available to respond to emergencies See telemetry face sheet for immediately available ER MD    Location [facility]-Cardiac & Pulmonary Rehab    Staff Present Renita Papa, RN BSN;Jessica [place], MA, RCEP, CCRP, CCET;Joseph [place], [place]    Virtual Visit No    Medication changes reported     No    Fall or balance concerns reported    No    Warm-up and Cool-down Performed on first and last piece of equipment    Resistance Training Performed Yes    VAD Patient? No    PAD/SET Patient? No      Pain Assessment   Currently in Pain? No/denies                Social History   Tobacco Use  Smoking Status Former   Packs/day: 1.00   Years: 23.00   Total pack years: 23.00   Types: Cigarettes   Quit date: 02/04/2022   Years since quitting: 0.4  Smokeless Tobacco Never    Goals Met:  Independence with exercise equipment Exercise tolerated well No report of concerns or symptoms today Strength training completed today  Goals Unmet:  Not Applicable  Comments: Pt able to follow exercise prescription today without complaint.  Will continue to monitor for progression.    Dr. Emily Filbert is Medical Director for [place].  Dr. Ottie Glazier is Medical Director for [facility] Pulmonary Rehabilitation.

## 2022-07-30 ENCOUNTER — Encounter: Payer: Medicaid Other | Admitting: *Deleted

## 2022-07-30 ENCOUNTER — Encounter: Payer: Self-pay | Admitting: Nurse Practitioner

## 2022-07-30 VITALS — Ht 72.25 in | Wt 261.2 lb

## 2022-07-30 DIAGNOSIS — I519 Heart disease, unspecified: Secondary | ICD-10-CM | POA: Diagnosis not present

## 2022-07-30 DIAGNOSIS — I5042 Chronic combined systolic (congestive) and diastolic (congestive) heart failure: Secondary | ICD-10-CM

## 2022-07-30 DIAGNOSIS — I5022 Chronic systolic (congestive) heart failure: Secondary | ICD-10-CM | POA: Diagnosis not present

## 2022-07-30 NOTE — Progress Notes (Signed)
[id number] 

## 2022-07-30 NOTE — Progress Notes (Signed)
Daily Session Note  Patient Details  Name: Lawrence Murray MRN: [id number] Date of Birth: 08-23-1965 Referring Provider:   Flowsheet Row Cardiac Rehab from 05/30/2022 in [facility] Cardiac and Pulmonary Rehab  Referring Provider Ernestina Penna MD       Encounter Date: 07/30/2022  Check In:  Session Check In - 07/30/22 1554       Check-In   Supervising physician immediately available to respond to emergencies See telemetry face sheet for immediately available ER MD    Location [facility]-Cardiac & Pulmonary Rehab    Staff Present Renita Papa, RN BSN;Joseph [place], RCP,RRT,BSRT;Kelly [place], [place], ACSM CEP, Exercise Physiologist    Virtual Visit No    Medication changes reported     No    Fall or balance concerns reported    No    Warm-up and Cool-down Performed on first and last piece of equipment    Resistance Training Performed Yes    VAD Patient? No    PAD/SET Patient? No      Pain Assessment   Currently in Pain? No/denies                Social History   Tobacco Use  Smoking Status Former   Packs/day: 1.00   Years: 23.00   Total pack years: 23.00   Types: Cigarettes   Quit date: 02/04/2022   Years since quitting: 0.4  Smokeless Tobacco Never    Goals Met:  Independence with exercise equipment Exercise tolerated well No report of concerns or symptoms today Strength training completed today  Goals Unmet:  Not Applicable  Comments: Pt able to follow exercise prescription today without complaint.  Will continue to monitor for progression.    Dr. Emily Filbert is Medical Director for [facility].  Dr. Ottie Glazier is Medical Director for [facility] Pulmonary Rehabilitation.

## 2022-07-30 NOTE — Patient Instructions (Signed)
Discharge Patient Instructions  Patient Details  Name: Lawrence Murray MRN: [id number] Date of Birth: [birth year] Referring Provider:  Kate Sable, MD   Number of Visits: 8  Reason for Discharge:  Patient reached a stable level of exercise. Patient independent in their exercise. Patient has met program and personal goals.  Smoking History:  Social History   Tobacco Use  Smoking Status Former   Packs/day: 1.00   Years: 23.00   Total pack years: 23.00   Types: Cigarettes   Quit date: 02/04/2022   Years since quitting: 0.4  Smokeless Tobacco Never    Diagnosis:  Chronic combined systolic and diastolic heart failure (HCC)  Initial Exercise Prescription:  Initial Exercise Prescription - 05/30/22 1500       Date of Initial Exercise RX and Referring Provider   Date 05/30/22    Referring Provider Ernestina Penna MD      Oxygen   Maintain Oxygen Saturation 88% or higher      Treadmill   MPH 2    Grade 0    Minutes 15    METs 2.53      REL-XR   Level 1    Speed 50    Minutes 15    METs 2.86      T5 Nustep   Level 1    SPM 80    Minutes 15    METs 2.86      Prescription Details   Frequency (times per week) 3    Duration Progress to 30 minutes of continuous aerobic without signs/symptoms of physical distress      Intensity   THRR 40-80% of Max Heartrate 108-146    Ratings of Perceived Exertion 11-13    Perceived Dyspnea 0-4      Progression   Progression Continue to progress workloads to maintain intensity without signs/symptoms of physical distress.      Resistance Training   Training Prescription Yes    Weight 4 lb    Reps 10-15             Discharge Exercise Prescription (Final Exercise Prescription Changes):  Exercise Prescription Changes - 07/18/22 1500       Home Exercise Plan   Plans to continue exercise at [facility] on [facility] and using their aerobic machines.   Frequency Add 2 additional  days to program exercise sessions.    Initial Home Exercises Provided 07/18/22      Oxygen   Maintain Oxygen Saturation 88% or higher             Functional Capacity:  6 Minute Walk     Row Name 05/30/22 1529 07/30/22 1558       6 Minute Walk   Phase Initial Discharge    Distance 1075 feet 1320 feet    Distance % Change -- 23 %    Distance Feet Change -- 245 ft    Walk Time 6 minutes 6 minutes    # of Rest Breaks 0 0    MPH 2.04 2.5    METS 2.86 3.46    RPE 13 11    Perceived Dyspnea  3 0    VO2 Peak 10 12.11    Symptoms Yes (comment) No    Comments SOB, hip, knee, foot pain/numbness --    Resting HR 70 bpm 90 bpm    Resting BP 100/52 112/72    Resting Oxygen Saturation  99 % 96 %    Exercise  Oxygen Saturation  during 6 min walk 100 % 94 %    Max Ex. HR 98 bpm 110 bpm    Max Ex. BP 114/64 130/72    2 Minute Post BP 106/58 --                Nutrition & Weight - Outcomes:  Pre Biometrics - 05/30/22 1547       Pre Biometrics   Height 6' 0.25" (1.835 m)    Weight 254 lb 14.4 oz (115.6 kg)    Waist Circumference 59.5 inches    Hip Circumference 43 inches    Waist to Hip Ratio 1.38 %    BMI (Calculated) 34.34    Single Leg Stand 26.2 seconds   Right            Post Biometrics - 07/30/22 1600        Post  Biometrics   Height 6' 0.25" (1.835 m)    Weight 261 lb 3.2 oz (118.5 kg)    Waist Circumference 51.5 inches    Hip Circumference 42 inches    Waist to Hip Ratio 1.23 %    BMI (Calculated) 35.19    Single Leg Stand 30 seconds             Nutrition:  Nutrition Therapy & Goals - 05/30/22 1649       Personal Nutrition Goals   Comments Sehaj reports feeling overwhelmed with his hospitalization this summer, quitting smoking, 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 Sabastion reports quitting smoking cold [place] was hard, but he  was not interested in interventions such as the patch due to potential side effects. He reports this was easier in the hospital due to less temptation and now has difficulty on a regular basis; after eating he will want to smoke, but instead will continue eating to curb this. Monterrius reports he tried to take care of himself by being an avid Engineer, manufacturing on various subjects such as nutrition in addition to meeting with an RD at the hospital as well as in the outpatient setting; he feels he knows how to be healthy, it is just a matter of doing it. This RD briefly reviewed some tactics to save on time and money while trying to eat healthy. Derrius reports sleeping during the morning hours as his son is at school and his father has an aide to help him so he is able to relax and sleep; he does not end up going to sleep until around 5-7am and will sleep until about 2pm. When he takes his medication he tries to have something in his stomach like a banana. He has his biggest meal in the evening around 7pm where he will have some protein such as pork chops, steak, or chicken with some vegetables (he does not care for frozen and will try to buy many fresh) and potentially a carbohydrate rich food like rice or starchy vegetables; he reports that this meal may be consisdered to be multiple portions by other people - reviewed meal structure such as being mindful of CHO quantity in one sitting. He reports not using fat such as oil or butter with cooking, not eating fried foods, and trying to eat balanced meals using a Sanjuana Letters. Snacks will vary with choices such as fruit, ice cream, cookies, and vanilla waffers. He reports  drinking water throughout the day. Encouraged Selig to attend the educational sessions as he is able to and pointed out the nutrition education was in November. Bijan reports that his preference is to not receive physical copies of handouts, provided him with youtube channel and sent email with  informational handouts on nutrition. Since meeting with this RD he reported not going to outpatient RD, but he still RD information so that he can contact them when needed especially post cardiac rehab.                Goals reviewed with patient; copy given to patient.

## 2022-07-31 ENCOUNTER — Ambulatory Visit (INDEPENDENT_AMBULATORY_CARE_PROVIDER_SITE_OTHER): Payer: Medicaid Other | Admitting: Acute Care

## 2022-07-31 ENCOUNTER — Encounter: Payer: Self-pay | Admitting: Acute Care

## 2022-07-31 DIAGNOSIS — Z87891 Personal history of nicotine dependence: Secondary | ICD-10-CM

## 2022-07-31 NOTE — Progress Notes (Signed)
Virtual Visit via Telephone Note  I connected with Lawrence Murray on 07/31/22 at  2:00 PM EST by telephone and verified that I am speaking with the correct person using two identifiers.  Location: Patient: At home Provider: [place], [place], [place], [address]    I discussed the limitations, risks, security and privacy concerns of performing an evaluation and management service by telephone and the availability of in person appointments. I also discussed with the patient that there may be a patient responsible charge related to this service. The patient expressed understanding and agreed to proceed.   Shared Decision Making Visit Lung Cancer Screening Program [id number])   Eligibility: Age 56 y.o. Pack Years Smoking History Calculation 32 pack year smoking history (# packs/per year x # years smoked) Recent History of coughing up blood  no Unexplained weight loss? no ( >Than 15 pounds within the last 6 months ) Prior History Lung / other cancer no (Diagnosis within the last 5 years already requiring surveillance chest CT Scans). Smoking Status Former Smoker Former Smokers: Years since quit: < 1 year  Quit Date: 02/04/2022  Visit Components: Discussion included one or more decision making aids. yes Discussion included risk/benefits of screening. yes Discussion included potential follow up diagnostic testing for abnormal scans. yes Discussion included meaning and risk of over diagnosis. yes Discussion included meaning and risk of False Positives. yes Discussion included meaning of total radiation exposure. yes  Counseling Included: Importance of adherence to annual lung cancer LDCT screening. yes Impact of comorbidities on ability to participate in the program. yes Ability and willingness to under diagnostic treatment. yes  Smoking Cessation Counseling: Current Smokers:  Discussed importance of smoking cessation. yes Information about tobacco cessation classes and  interventions provided to patient. yes Patient provided with "ticket" for LDCT Scan. yes Symptomatic Patient. no  Counseling NA Diagnosis Code: Tobacco Use Z72.0 Asymptomatic Patient yes  Counseling (Intermediate counseling: > three minutes counseling) T6546 Former Smokers:  Discussed the importance of maintaining cigarette abstinence. yes Diagnosis Code: Personal History of Nicotine Dependence. T03.546 Information about tobacco cessation classes and interventions provided to patient. Yes Patient provided with "ticket" for LDCT Scan. yes Written Order for Lung Cancer Screening with LDCT placed in Epic. Yes (CT Chest Lung Cancer Screening Low Dose W/O CM) [id number] Z12.2-Screening of respiratory organs Z87.891-Personal history of nicotine dependence  I spent 25 minutes of face to face time/virtual visit time  with  [name] discussing the risks and benefits of lung cancer screening. We took the time to pause the power point at intervals to allow for questions to be asked and answered to ensure understanding. We discussed that he had taken the single most powerful action possible to decrease his risk of developing lung cancer when he quit smoking. I counseled him to remain smoke free, and to contact me if he ever had the desire to smoke again so that I can provide resources and tools to help support the effort to remain smoke free. We discussed the time and location of the scan, and that either  Doroteo Glassman RN, Joella Prince, RN or I  or I will call / send a letter with the results within  24-72 hours of receiving them. He has the office contact information in the event he needs to speak with me,  he verbalized understanding of all of the above and had no further questions upon leaving the office.     I explained to the patient that there has been  a high incidence of coronary artery disease noted on these exams. I explained that this is a non-gated exam therefore degree or severity cannot be  determined. This patient is on statin therapy. I have asked the patient to follow-up with their PCP regarding any incidental finding of coronary artery disease and management with diet or medication as they feel is clinically indicated. The patient verbalized understanding of the above and had no further questions.     Magdalen Spatz, NP 07/31/2022

## 2022-07-31 NOTE — Patient Instructions (Signed)
Thank you for participating in the [facility] Lung Cancer Screening Program. It was our pleasure to meet you today. We will call you with the results of your scan within the next few days. Your scan will be assigned a Lung RADS category score by the physicians reading the scans.  This Lung RADS score determines follow up scanning.  See below for description of categories, and follow up screening recommendations. We will be in touch to schedule your follow up screening annually or based on recommendations of our providers. We will fax a copy of your scan results to your Primary Care Physician, or the physician who referred you to the program, to ensure they have the results. Please call the office if you have any questions or concerns regarding your scanning experience or results.  Our office number is [phone]. Please speak with Denise Phelps, RN. , or  Denise Buckner RN, They are  our Lung Cancer Screening RN.'s If They are unavailable when you call, Please leave a message on the voice mail. We will return your call at our earliest convenience.This voice mail is monitored several times a day.  Remember, if your scan is normal, we will scan you annually as long as you continue to meet the criteria for the program. (Age 55-77, Current smoker or smoker who has quit within the last 15 years). If you are a smoker, remember, quitting is the single most powerful action that you can take to decrease your risk of lung cancer and other pulmonary, breathing related problems. We know quitting is hard, and we are here to help.  Please let us know if there is anything we can do to help you meet your goal of quitting. If you are a former smoker, congratulations. We are proud of you! Remain smoke free! Remember you can refer friends or family members through the number above.  We will screen them to make sure they meet criteria for the program. Thank you for helping us take better care of you by  participating in Lung Screening.  You can receive free nicotine replacement therapy ( patches, gum or mints) by calling 1-800-QUIT NOW. Please call so we can get you on the path to becoming  a non-smoker. I know it is hard, but you can do this!  Lung RADS Categories:  Lung RADS 1: no nodules or definitely non-concerning nodules.  Recommendation is for a repeat annual scan in 12 months.  Lung RADS 2:  nodules that are non-concerning in appearance and behavior with a very low likelihood of becoming an active cancer. Recommendation is for a repeat annual scan in 12 months.  Lung RADS 3: nodules that are probably non-concerning , includes nodules with a low likelihood of becoming an active cancer.  Recommendation is for a 6-month repeat screening scan. Often noted after an upper respiratory illness. We will be in touch to make sure you have no questions, and to schedule your 6-month scan.  Lung RADS 4 A: nodules with concerning findings, recommendation is most often for a follow up scan in 3 months or additional testing based on our provider's assessment of the scan. We will be in touch to make sure you have no questions and to schedule the recommended 3 month follow up scan.  Lung RADS 4 B:  indicates findings that are concerning. We will be in touch with you to schedule additional diagnostic testing based on our provider's  assessment of the scan.  Other options for assistance in smoking cessation (   As covered by your insurance benefits)  Hypnosis for smoking cessation  [facility] [phone]  Acupuncture for smoking cessation  [facility] [phone]   

## 2022-08-01 ENCOUNTER — Encounter: Payer: Medicaid Other | Admitting: *Deleted

## 2022-08-01 ENCOUNTER — Ambulatory Visit
Admission: RE | Admit: 2022-08-01 | Discharge: 2022-08-01 | Disposition: A | Discharge status: Home / Self Care | Payer: Medicaid Other | Source: Ambulatory Visit | Attending: Acute Care | Admitting: Acute Care

## 2022-08-01 DIAGNOSIS — I5022 Chronic systolic (congestive) heart failure: Secondary | ICD-10-CM | POA: Diagnosis not present

## 2022-08-01 DIAGNOSIS — I5042 Chronic combined systolic (congestive) and diastolic (congestive) heart failure: Secondary | ICD-10-CM

## 2022-08-01 DIAGNOSIS — J439 Emphysema, unspecified: Secondary | ICD-10-CM | POA: Diagnosis not present

## 2022-08-01 DIAGNOSIS — I700 Atherosclerosis of aorta: Secondary | ICD-10-CM | POA: Insufficient documentation

## 2022-08-01 DIAGNOSIS — I2510 Atherosclerotic heart disease of native coronary artery without angina pectoris: Secondary | ICD-10-CM | POA: Insufficient documentation

## 2022-08-01 DIAGNOSIS — Z122 Encounter for screening for malignant neoplasm of respiratory organs: Secondary | ICD-10-CM | POA: Diagnosis not present

## 2022-08-01 DIAGNOSIS — Z87891 Personal history of nicotine dependence: Secondary | ICD-10-CM | POA: Insufficient documentation

## 2022-08-01 DIAGNOSIS — I519 Heart disease, unspecified: Secondary | ICD-10-CM | POA: Diagnosis not present

## 2022-08-01 NOTE — Progress Notes (Signed)
Daily Session Note  Patient Details  Name: Lawrence Murray MRN: [id number] Date of Birth: [birth year] Referring Provider:   Flowsheet Row Cardiac Rehab from 05/30/2022 in [facility] Cardiac and Pulmonary Rehab  Referring Provider Ernestina Penna MD       Encounter Date: 08/01/2022  Check In:  Session Check In - 08/01/22 1552       Check-In   Supervising physician immediately available to respond to emergencies See telemetry face sheet for immediately available ER MD    Location [facility]-Cardiac & Pulmonary Rehab    Staff Present Renita Papa, RN BSN;Joseph Tessie Fass, RCP,RRT,BSRT;Kristen [place], [place]    Virtual Visit No    Medication changes reported     No    Fall or balance concerns reported    No    Warm-up and Cool-down Performed on first and last piece of equipment    Resistance Training Performed Yes    VAD Patient? No    PAD/SET Patient? No                Social History   Tobacco Use  Smoking Status Former   Packs/day: 1.00   Years: 32.00   Total pack years: 32.00   Types: Cigarettes   Quit date: 02/04/2022   Years since quitting: 0.4  Smokeless Tobacco Never    Goals Met:  Independence with exercise equipment Exercise tolerated well No report of concerns or symptoms today Strength training completed today  Goals Unmet:  Not Applicable  Comments: Pt able to follow exercise prescription today without complaint.  Will continue to monitor for progression.    Dr. Emily Filbert is Medical Director for [facility].  Dr. Ottie Glazier is Medical Director for [facility] Pulmonary Rehabilitation.

## 2022-08-02 ENCOUNTER — Encounter: Payer: Medicaid Other | Admitting: *Deleted

## 2022-08-02 DIAGNOSIS — I5022 Chronic systolic (congestive) heart failure: Secondary | ICD-10-CM | POA: Diagnosis not present

## 2022-08-02 DIAGNOSIS — I5042 Chronic combined systolic (congestive) and diastolic (congestive) heart failure: Secondary | ICD-10-CM

## 2022-08-02 DIAGNOSIS — I519 Heart disease, unspecified: Secondary | ICD-10-CM | POA: Diagnosis not present

## 2022-08-02 NOTE — Assessment & Plan Note (Signed)
Encourage patient to perform deep breathing exercises, meditation and journaling. Continue Xanax 0.5 mg as needed 3 times a day

## 2022-08-02 NOTE — Progress Notes (Signed)
Daily Session Note  Patient Details  Name: Lawrence Murray MRN: [id number] Date of Birth: 24-Apr-1966 Referring Provider:   Flowsheet Row Cardiac Rehab from 05/30/2022 in [facility] Cardiac and Pulmonary Rehab  Referring Provider Ernestina Penna MD       Encounter Date: 08/02/2022  Check In:  Session Check In - 08/02/22 1536       Check-In   Supervising physician immediately available to respond to emergencies See telemetry face sheet for immediately available ER MD    Location [facility]-Cardiac & Pulmonary Rehab    Staff Present Renita Papa, RN BSN;Joseph Tessie Fass, RCP,RRT,BSRT;Noah [place], [place], Exercise Physiologist    Virtual Visit No    Medication changes reported     No    Fall or balance concerns reported    No    Warm-up and Cool-down Performed on first and last piece of equipment    Resistance Training Performed Yes    VAD Patient? No    PAD/SET Patient? No      Pain Assessment   Currently in Pain? No/denies                Social History   Tobacco Use  Smoking Status Former   Packs/day: 1.00   Years: 32.00   Total pack years: 32.00   Types: Cigarettes   Quit date: 02/04/2022   Years since quitting: 0.4  Smokeless Tobacco Never    Goals Met:  Independence with exercise equipment Exercise tolerated well No report of concerns or symptoms today Strength training completed today  Goals Unmet:  Not Applicable  Comments: Pt able to follow exercise prescription today without complaint.  Will continue to monitor for progression.    Dr. Emily Filbert is Medical Director for [facility].  Dr. Ottie Glazier is Medical Director for [facility] Pulmonary Rehabilitation.

## 2022-08-02 NOTE — Assessment & Plan Note (Signed)
His previous hemoglobin A1c 7.7 on 02/06/2022 Advised pt to check the BS regularly, make a log and bring to next appointment.  Advised pt to eat variety of food including fruits, vegetables, whole grains, complex carbohydrates and proteins.  Continue Wilder Glade and metformin

## 2022-08-02 NOTE — Assessment & Plan Note (Signed)
Encourage patient to elevate the feet and use compressed compression stockings. Continue spironolactone

## 2022-08-02 NOTE — Assessment & Plan Note (Signed)
>>  ASSESSMENT AND PLAN FOR TYPE 2 DIABETES MELLITUS WITH HYPERGLYCEMIA (HCC) WRITTEN ON 08/02/2022  8:01 AM BY Kara Dies, NP  His previous hemoglobin A1c 7.7 on 02/06/2022 Advised pt to check the BS regularly, make a log and bring to next appointment.  Advised pt to eat variety of food including fruits, vegetables, whole grains, complex carbohydrates and proteins.  Continue Marcelline Deist and metformin

## 2022-08-02 NOTE — Assessment & Plan Note (Signed)
Patient BP  Vitals:   06/29/22 1508  BP: 106/75    in the office today. Advised pt to follow a low sodium and heart healthy diet. Continue the current medication regimen.

## 2022-08-03 ENCOUNTER — Other Ambulatory Visit: Payer: Self-pay

## 2022-08-03 DIAGNOSIS — Z122 Encounter for screening for malignant neoplasm of respiratory organs: Secondary | ICD-10-CM

## 2022-08-03 DIAGNOSIS — Z87891 Personal history of nicotine dependence: Secondary | ICD-10-CM

## 2022-08-06 ENCOUNTER — Encounter: Payer: Medicaid Other | Admitting: *Deleted

## 2022-08-06 DIAGNOSIS — I519 Heart disease, unspecified: Secondary | ICD-10-CM | POA: Diagnosis not present

## 2022-08-06 DIAGNOSIS — I5022 Chronic systolic (congestive) heart failure: Secondary | ICD-10-CM | POA: Diagnosis not present

## 2022-08-06 DIAGNOSIS — I5042 Chronic combined systolic (congestive) and diastolic (congestive) heart failure: Secondary | ICD-10-CM

## 2022-08-06 NOTE — Progress Notes (Signed)
Daily Session Note  Patient Details  Name: Lawrence Murray MRN: [id number] Date of Birth: March 04, 1966 Referring Provider:   Flowsheet Row Cardiac Rehab from 05/30/2022 in [facility]  Referring Provider Ernestina Penna MD       Encounter Date: 08/06/2022  Check In:  Session Check In - 08/06/22 1558       Check-In   Supervising physician immediately available to respond to emergencies See telemetry face sheet for immediately available ER MD    Location [facility]-Cardiac & Pulmonary Rehab    Staff Present Renita Papa, RN BSN;Joseph Tessie Fass, RCP,RRT,BSRT;Noah [place], [place], Exercise Physiologist    Virtual Visit No    Medication changes reported     No    Fall or balance concerns reported    No    Warm-up and Cool-down Performed on first and last piece of equipment    Resistance Training Performed Yes    VAD Patient? No    PAD/SET Patient? No      Pain Assessment   Currently in Pain? No/denies                Social History   Tobacco Use  Smoking Status Former   Packs/day: 1.00   Years: 32.00   Total pack years: 32.00   Types: Cigarettes   Quit date: 02/04/2022   Years since quitting: 0.5  Smokeless Tobacco Never    Goals Met:  Independence with exercise equipment Exercise tolerated well No report of concerns or symptoms today Strength training completed today  Goals Unmet:  Not Applicable  Comments: Pt able to follow exercise prescription today without complaint.  Will continue to monitor for progression.    Dr. Emily Filbert is Medical Director for [place].  Dr. Ottie Glazier is Medical Director for [facility] Pulmonary Rehabilitation.

## 2022-08-07 ENCOUNTER — Ambulatory Visit (INDEPENDENT_AMBULATORY_CARE_PROVIDER_SITE_OTHER): Payer: Medicaid Other | Admitting: Vascular Surgery

## 2022-08-07 ENCOUNTER — Encounter (INDEPENDENT_AMBULATORY_CARE_PROVIDER_SITE_OTHER): Payer: Self-pay | Admitting: Vascular Surgery

## 2022-08-07 VITALS — BP 110/75 | HR 82 | Resp 16 | Ht 72.0 in | Wt 265.0 lb

## 2022-08-07 DIAGNOSIS — R600 Localized edema: Secondary | ICD-10-CM

## 2022-08-07 DIAGNOSIS — I10 Essential (primary) hypertension: Secondary | ICD-10-CM

## 2022-08-07 DIAGNOSIS — I739 Peripheral vascular disease, unspecified: Secondary | ICD-10-CM | POA: Diagnosis not present

## 2022-08-07 DIAGNOSIS — E1165 Type 2 diabetes mellitus with hyperglycemia: Secondary | ICD-10-CM | POA: Diagnosis not present

## 2022-08-07 DIAGNOSIS — I5042 Chronic combined systolic (congestive) and diastolic (congestive) heart failure: Secondary | ICD-10-CM

## 2022-08-07 NOTE — Assessment & Plan Note (Signed)

## 2022-08-07 NOTE — Progress Notes (Signed)
Patient ID: Cashel Bellina, male   DOB: 10-19-1965, 56 y.o.   MRN: [id number]  Chief Complaint  Patient presents with   New Patient (Initial Visit)    Consult for le edema    HPI Nicola Quesnell is a 56 y.o. male.  I am asked to see the patient by Theresia Lo for evaluation of leg swelling. ***     Past Medical History:  Diagnosis Date   CHF (congestive heart failure) ([place])    Coronary artery disease    Diabetes mellitus without complication ([address])    Hypertension     Past Surgical History:  Procedure Laterality Date   COLONOSCOPY WITH PROPOFOL N/A 06/14/2022   Procedure: COLONOSCOPY WITH PROPOFOL;  Surgeon: [name], MD;  Location: [facility] ENDOSCOPY;  Service: Gastroenterology;  Laterality: N/A;   LOWER EXTREMITY ANGIOGRAPHY Left 02/14/2022   Procedure: Lower Extremity Angiography;  Surgeon: Algernon Huxley, MD;  Location: [facility] CV LAB;  Service: Cardiovascular;  Laterality: Left;   RIGHT/LEFT HEART CATH AND CORONARY ANGIOGRAPHY N/A 02/21/2022   Procedure: RIGHT/LEFT HEART CATH AND CORONARY ANGIOGRAPHY;  Surgeon: Nelva Bush, MD;  Location: [place] [facility];  Service: Cardiovascular;  Laterality: N/A;     Family History  Problem Relation Age of Onset   Congestive Heart Failure Father   No bleeding or clotting disorders No aneurysms   Social History   Tobacco Use   Smoking status: Former    Packs/day: 1.00    Years: 32.00    Total pack years: 32.00    Types: Cigarettes    Quit date: 02/04/2022    Years since quitting: 0.5   Smokeless tobacco: Never  Substance Use Topics   Alcohol use: Not Currently   Drug use: Never     No Known Allergies  Current Outpatient Medications  Medication Sig Dispense Refill   Accu-Chek Softclix Lancets lancets Use as directed to check blood glucose daily 100 each 0   acetaminophen (TYLENOL) 500 MG tablet Take 2 tablets (1,000 mg total) by mouth every 6 (six) hours as needed for mild pain, moderate pain, fever or  headache. 30 tablet 0   ALPRAZolam (XANAX) 1 MG tablet Take 0.5 tablets (0.5 mg total) by mouth 3 (three) times daily as needed for anxiety. 90 tablet 0   aspirin EC 81 MG tablet Take 1 tablet (81 mg total) by mouth daily. Swallow whole. 90 tablet 2   atorvastatin (LIPITOR) 80 MG tablet Take 1 tablet (80 mg total) by mouth daily. 90 tablet 2   Blood Glucose Monitoring Suppl (ACCU-CHEK GUIDE ME) w/Device KIT Use as directed daily 1 kit 0   dapagliflozin propanediol (FARXIGA) 10 MG TABS tablet Take one tablet by mouth daily 90 tablet 3   digoxin (LANOXIN) 0.125 MG tablet Take 1 tablet (0.125 mg total) by mouth daily. 90 tablet 2   docusate sodium (STOOL SOFTENER) 250 MG capsule Take 250 mg by mouth daily.     furosemide (LASIX) 40 MG tablet Take 1.5 tablets (60 mg total) by mouth daily. 135 tablet 3   gabapentin (NEURONTIN) 100 MG capsule Take 1 capsule (100 mg total) by mouth 3 (three) times daily. 60 capsule 1   glucose blood test strip Use as directed once daily 100 each 0   metFORMIN (GLUCOPHAGE) 500 MG tablet Take 1 tablet (500 mg total) by mouth 2 (two) times daily. 180 tablet 3   metoprolol succinate (TOPROL-XL) 25 MG 24 hr tablet Take 0.5 tablets (12.5 mg total) by mouth  daily. 45 tablet 2   pantoprazole (PROTONIX) 40 MG tablet Take 1 tablet (40 mg total) by mouth daily. 30 tablet 11   potassium chloride SA (KLOR-CON M) 20 MEQ tablet Take 1 tablet (20 mEq total) by mouth daily. 90 tablet 2   spironolactone (ALDACTONE) 25 MG tablet Take 0.5 tablets (12.5 mg total) by mouth daily. 45 tablet 2   Zoster Vaccine Adjuvanted [facility]) injection Inject into the muscle. 0.5 mL 1   No current facility-administered medications for this visit.      REVIEW OF SYSTEMS (Negative unless checked)  Constitutional: []Weight loss  []Fever  []Chills Cardiac: [x]Chest pain   []Chest pressure   []Palpitations   []Shortness of breath when laying flat   []Shortness of breath at rest   [x]Shortness of breath  with exertion. Vascular:  []Pain in legs with walking   []Pain in legs at rest   []Pain in legs when laying flat   []Claudication   []Pain in feet when walking  []Pain in feet at rest  []Pain in feet when laying flat   []History of DVT   []Phlebitis   [x]Swelling in legs   []Varicose veins   []Non-healing ulcers Pulmonary:   []Uses home oxygen   []Productive cough   []Hemoptysis   []Wheeze  []COPD   []Asthma Neurologic:  []Dizziness  []Blackouts   []Seizures   []History of stroke   []History of TIA  []Aphasia   []Temporary blindness   []Dysphagia   []Weakness or numbness in arms   []Weakness or numbness in legs Musculoskeletal:  []Arthritis   []Joint swelling   [x]Joint pain   []Low back pain Hematologic:  []Easy bruising  []Easy bleeding   []Hypercoagulable state   []Anemic  []Hepatitis Gastrointestinal:  []Blood in stool   []Vomiting blood  []Gastroesophageal reflux/heartburn   []Abdominal pain Genitourinary:  []Chronic kidney disease   []Difficult urination  []Frequent urination  []Burning with urination   []Hematuria Skin:  []Rashes   []Ulcers   []Wounds Psychological:  []History of anxiety   [] History of major depression.    Physical Exam BP 110/75 (BP Location: Left Arm)   Pulse 82   Resp 16   Ht 6' (1.829 m)   Wt 265 lb (120.2 kg)   BMI 35.94 kg/m  Gen:  WD/WN, NAD Head: [place]/AT, No temporalis wasting.  Ear/Nose/Throat: Hearing grossly intact, nares w/o erythema or drainage, oropharynx w/o Erythema/Exudate Eyes: Conjunctiva clear, sclera non-icteric  Neck: trachea midline.  No JVD.  Pulmonary:  Good air movement, respirations not labored, no use of accessory muscles  Cardiac: irregular Vascular:  Vessel Right Left  Radial Palpable Palpable                          PT 1+ NP  DP 1+ 1+   Gastrointestinal:. No masses, surgical incisions, or scars. Musculoskeletal: M/S 5/5 throughout.  Extremities without ischemic changes.  No deformity or atrophy. Trace RLE edema, 1+ LLE  edema. Neurologic: Sensation grossly intact in extremities.  Symmetrical.  Speech is fluent. Motor exam as listed above. Psychiatric: Judgment intact, Mood & affect appropriate for pt's clinical situation. Dermatologic: No rashes or ulcers noted.  No cellulitis or open wounds.    Radiology CT CHEST LUNG CA SCREEN LOW DOSE W/O CM  Result Date: 08/02/2022 CLINICAL DATA:  56 year old asymptomatic male former smoker with 32 pack-year smoking history, quit smoking 02/04/2022. EXAM: CT CHEST WITHOUT CONTRAST LOW-DOSE FOR LUNG CANCER SCREENING TECHNIQUE: Multidetector  CT imaging of the chest was performed following the standard protocol without IV contrast. RADIATION DOSE REDUCTION: This exam was performed according to the departmental dose-optimization program which includes automated exposure control, adjustment of the mA and/or kV according to patient size and/or use of iterative reconstruction technique. COMPARISON:  None Available. FINDINGS: Cardiovascular: Normal heart size. No significant pericardial effusion/thickening. Three-vessel coronary atherosclerosis. Mildly atherosclerotic nonaneurysmal thoracic aorta. Normal caliber pulmonary arteries. Mediastinum/Nodes: No significant thyroid nodules. Unremarkable esophagus. No pathologically enlarged axillary, mediastinal or hilar lymph nodes, noting limited sensitivity for the detection of hilar adenopathy on this noncontrast study. Lungs/Pleura: No pneumothorax. No pleural effusion. Mild paraseptal and centrilobular emphysema with diffuse bronchial wall thickening. No acute consolidative airspace disease or lung masses. No significant pulmonary nodules. Tiny calcified granuloma in the anterior medial basilar right upper lobe. Upper abdomen: No acute abnormality. Musculoskeletal: No aggressive appearing focal osseous lesions. Mild thoracic spondylosis. IMPRESSION: 1. Lung-RADS 1, negative. Continue annual screening with low-dose chest CT without contrast in  12 months. 2. Three-vessel coronary atherosclerosis. 3. Aortic Atherosclerosis (ICD10-I70.0) and Emphysema (ICD10-J43.9). Electronically Signed   By: Ilona Sorrel M.D.   On: 08/02/2022 14:19  ECHOCARDIOGRAM COMPLETE  Result Date: 07/19/2022    ECHOCARDIOGRAM REPORT   Patient Name:   Omarie Litts Date of Exam: 07/19/2022 Medical Rec #:  [id number]    Height:       73.0 in Accession #:    [id number]   Weight:       258.4 lb Date of Birth:  [birth year]   BSA:          2.399 m Patient Age:    89 years     BP:           106/75 mmHg Patient Gender: M            HR:           73 bpm. Exam Location:  [facility] Procedure: 2D Echo, Cardiac Doppler, Color Doppler and Intracardiac            Opacification Agent Indications:    CHF-Acute Systolic Q68.34  History:        Patient has prior history of Echocardiogram examinations, most                 recent 02/06/2022. CAD, PVD; Risk Factors:Diabetes.  Sonographer:    Luane School RDCS Referring Phys: [id number] CADENCE H FURTH  Sonographer Comments: Suboptimal apical window. Global longitudinal strain was attempted. IMPRESSIONS  1. Left ventricular ejection fraction, by estimation, is 30%. The left ventricle has moderately decreased function. The left ventricle demonstrates global hypokinesis. The left ventricular internal cavity size was mildly dilated. Left ventricular diastolic parameters are consistent with Grade II diastolic dysfunction (pseudonormalization).  2. Right ventricular systolic function is normal. The right ventricular size is normal. There is normal pulmonary artery systolic pressure.  3. Left atrial size was mildly dilated.  4. The mitral valve is normal in structure. Mild mitral valve regurgitation. No evidence of mitral stenosis.  5. The aortic valve is normal in structure. Aortic valve regurgitation is not visualized. No aortic stenosis is present.  6. The inferior vena cava is normal in size with greater than 50% respiratory variability, suggesting right  atrial pressure of 3 mmHg. FINDINGS  Left Ventricle: Left ventricular ejection fraction, by estimation, is 30 %. The left ventricle has moderately decreased function. The left ventricle demonstrates global hypokinesis. Definity contrast agent was given IV to delineate the left ventricular endocardial borders. The  left ventricular internal cavity size was mildly dilated. There is no left ventricular hypertrophy. Left ventricular diastolic parameters are consistent with Grade II diastolic dysfunction (pseudonormalization). Right Ventricle: The right ventricular size is normal. No increase in right ventricular wall thickness. Right ventricular systolic function is normal. There is normal pulmonary artery systolic pressure. The tricuspid regurgitant velocity is 1.41 m/s, and  with an assumed right atrial pressure of 3 mmHg, the estimated right ventricular systolic pressure is 75.8 mmHg. Left Atrium: Left atrial size was mildly dilated. Right Atrium: Right atrial size was normal in size. Pericardium: There is no evidence of pericardial effusion. Mitral Valve: The mitral valve is normal in structure. Mild mitral valve regurgitation. No evidence of mitral valve stenosis. Tricuspid Valve: The tricuspid valve is normal in structure. Tricuspid valve regurgitation is not demonstrated. No evidence of tricuspid stenosis. Aortic Valve: The aortic valve is normal in structure. Aortic valve regurgitation is not visualized. No aortic stenosis is present. Pulmonic Valve: The pulmonic valve was normal in structure. Pulmonic valve regurgitation is not visualized. No evidence of pulmonic stenosis. Aorta: The aortic root is normal in size and structure. Venous: The inferior vena cava is normal in size with greater than 50% respiratory variability, suggesting right atrial pressure of 3 mmHg. IAS/Shunts: No atrial level shunt detected by color flow Doppler.  LEFT VENTRICLE PLAX 2D LVIDd:         5.80 cm      Diastology LVIDs:         4.70  cm      LV e' medial:    8.16 cm/s LV PW:         1.10 cm      LV E/e' medial:  14.1 LV IVS:        1.00 cm      LV e' lateral:   8.59 cm/s LVOT diam:     2.00 cm      LV E/e' lateral: 13.4 LV SV:         49 LV SV Index:   20 LVOT Area:     3.14 cm  LV Volumes (MOD) LV vol d, MOD A2C: 103.0 ml LV vol d, MOD A4C: 131.0 ml LV vol s, MOD A2C: 73.2 ml LV vol s, MOD A4C: 82.8 ml LV SV MOD A2C:     29.8 ml LV SV MOD A4C:     131.0 ml LV SV MOD BP:      37.9 ml RIGHT VENTRICLE            IVC RV S prime:     8.92 cm/s  IVC diam: 1.80 cm TAPSE (M-mode): 1.5 cm LEFT ATRIUM             Index        RIGHT ATRIUM           Index LA diam:        5.30 cm 2.21 cm/m   RA Area:     15.50 cm LA Vol (A2C):   65.4 ml 27.26 ml/m  RA Volume:   41.50 ml  17.30 ml/m LA Vol (A4C):   73.6 ml 30.68 ml/m LA Biplane Vol: 74.3 ml 30.97 ml/m  AORTIC VALVE LVOT Vmax:   80.30 cm/s LVOT Vmean:  53.600 cm/s LVOT VTI:    0.156 m  AORTA Ao Root diam: 3.30 cm Ao Asc diam:  3.50 cm Ao Desc diam: 2.10 cm MITRAL VALVE  TRICUSPID VALVE MV Area (PHT): 5.38 cm     TR Peak grad:   8.0 mmHg MV Decel Time: 141 msec     TR Vmax:        141.00 cm/s MV E velocity: 115.00 cm/s MV A velocity: 54.80 cm/s   SHUNTS MV E/A ratio:  2.10         Systemic VTI:  0.16 m                             Systemic Diam: 2.00 cm Ida Rogue MD Electronically signed by Ida Rogue MD Signature Date/Time: 07/19/2022/6:40:04 PM    Final     Labs Recent Results (from the past 2160 hour(s))  Glucose, capillary     Status: Abnormal   Collection Time: 05/31/22  3:29 PM  Result Value Ref Range   Glucose-Capillary 170 (H) 70 - 99 mg/dL    Comment: Glucose reference range applies only to samples taken after fasting for at least 8 hours.  Glucose, capillary     Status: Abnormal   Collection Time: 05/31/22  4:33 PM  Result Value Ref Range   Glucose-Capillary 153 (H) 70 - 99 mg/dL    Comment: Glucose reference range applies only to samples taken after  fasting for at least 8 hours.  Microalbumin, urine     Status: None   Collection Time: 06/01/22  3:36 PM  Result Value Ref Range   Microalb, Ur <0.2 mg/dL    Comment: Reference Range Not established    RAM      Comment: . The ADA defines abnormalities in albumin excretion as follows: Marland Kitchen Albuminuria Category         Result (mcg/mg creatinine) . Normal to Mildly increased    <30 Moderately increased          30-299  Severely increased            > OR = 300 . The ADA recommends that at least two of three specimens collected within a 3-6 month period be abnormal before considering a patient to be within a diagnostic category.   Glucose, capillary     Status: Abnormal   Collection Time: 06/04/22  3:36 PM  Result Value Ref Range   Glucose-Capillary 178 (H) 70 - 99 mg/dL    Comment: Glucose reference range applies only to samples taken after fasting for at least 8 hours.  Glucose, capillary     Status: Abnormal   Collection Time: 06/04/22  4:34 PM  Result Value Ref Range   Glucose-Capillary 199 (H) 70 - 99 mg/dL    Comment: Glucose reference range applies only to samples taken after fasting for at least 8 hours.  Glucose, capillary     Status: Abnormal   Collection Time: 06/06/22  3:35 PM  Result Value Ref Range   Glucose-Capillary 166 (H) 70 - 99 mg/dL    Comment: Glucose reference range applies only to samples taken after fasting for at least 8 hours.  Glucose, capillary     Status: Abnormal   Collection Time: 06/06/22  4:34 PM  Result Value Ref Range   Glucose-Capillary 158 (H) 70 - 99 mg/dL    Comment: Glucose reference range applies only to samples taken after fasting for at least 8 hours.  Basic metabolic panel     Status: Abnormal   Collection Time: 06/08/22  3:26 PM  Result Value Ref Range   Sodium 136 135 - 145  mmol/L   Potassium 4.7 3.5 - 5.1 mmol/L   Chloride 100 98 - 111 mmol/L   CO2 27 22 - 32 mmol/L   Glucose, Bld 192 (H) 70 - 99 mg/dL    Comment: Glucose  reference range applies only to samples taken after fasting for at least 8 hours.   BUN 19 6 - 20 mg/dL   Creatinine, Ser 1.10 0.61 - 1.24 mg/dL   Calcium 9.7 8.9 - 10.3 mg/dL   GFR, Estimated >60 >60 mL/min    Comment: (NOTE) Calculated using the CKD-EPI Creatinine Equation (2021)    Anion gap 9 5 - 15    Comment: Performed at [facility], [address] [place], [place] [address]  Glucose, capillary     Status: Abnormal   Collection Time: 06/14/22  9:00 AM  Result Value Ref Range   Glucose-Capillary 168 (H) 70 - 99 mg/dL    Comment: Glucose reference range applies only to samples taken after fasting for at least 8 hours.   Comment 1 IN EPIC   Surgical pathology     Status: None   Collection Time: 06/14/22  9:46 AM  Result Value Ref Range   SURGICAL PATHOLOGY      SURGICAL PATHOLOGY CASE: [phone] PATIENT: [facility] Report     Specimen Submitted: A. Colon polyp x2, sigmoid; cold snare B. Rectum polyp; cold snare  Clinical History: Screening colonoscopy.  Colon polyps      DIAGNOSIS: A.  COLON POLYP X 2, SIGMOID; COLD SNARE: - TUBULAR ADENOMA. - HYPERPLASTIC POLYP. - NEGATIVE FOR HIGH-GRADE DYSPLASIA AND MALIGNANCY.  B.  RECTUM POLYP; COLD SNARE: - HYPERPLASTIC POLYP. - NEGATIVE FOR DYSPLASIA AND MALIGNANCY.  GROSS DESCRIPTION: A. Labeled: Sigmoid colon polyp cold snare x 2 Received: Formalin Collection time: 9:46 AM on 06/14/2022 Placed into formalin time: 9:46 AM on 06/14/2022 Tissue fragment(s): Multiple Size: Aggregate, 1.8 x 0.5 x 0.1 cm Description: Received are tan soft tissue fragments, admixed with intestinal debris.  The ratio of soft tissue to intestinal debris is 50: 50. Entirely submitted in 1 cassette.  B. Labeled: Rectum polyp cold snare Received: Formalin Collect ion time: 10:06 AM on 06/14/2022 Placed into formalin time: 10:06 AM on 06/14/2022 Tissue fragment(s): Multiple Size: Aggregate, 0.5 x  0.2 x 0.1 cm Description: Received is a tan soft tissue fragment, admixed with intestinal debris.  The ratio of soft tissue to intestinal debris is 90: 10. Entirely submitted in 1 cassette.  CM 06/14/2022  Final Diagnosis performed by Quay Burow, MD.   Electronically signed 06/15/2022 8:28:33AM The electronic signature indicates that the named Attending Pathologist has evaluated the specimen Technical component performed at [facility], [address] [place], [place] [address] Lab: [phone] Dir: Rush Farmer, MD, MMM  Professional component performed at [facility], [facility], [place], [place], [place] [address] Lab: [phone] Dir: Kathi Simpers, MD   ECHOCARDIOGRAM COMPLETE     Status: None   Collection Time: 07/19/22  2:58 PM  Result Value Ref Range   S' Lateral 4.70 cm   Area-P 1/2 5.38 cm2   Single Plane A4C EF 36.8 %   Single Plane A2C EF 28.9 %   Calc EF 32.2 %  HM DIABETES EYE EXAM     Status: None   Collection Time: 07/24/22 12:00 AM  Result Value Ref Range   HM Diabetic Eye Exam No Retinopathy No Retinopathy  Glucose, capillary     Status: Abnormal   Collection Time: 07/25/22  4:36 PM  Result Value Ref Range   Glucose-Capillary 167 (H) 70 - 99 mg/dL    Comment: Glucose reference range applies only to samples taken after fasting for at least 8 hours.    Assessment/Plan:  No problem-specific Assessment & Plan notes found for this encounter.      Leotis Pain 08/07/2022, 3:33 PM   This note was created with Dragon medical transcription system.  Any errors from dictation are unintentional.

## 2022-08-07 NOTE — Assessment & Plan Note (Signed)
>>  ASSESSMENT AND PLAN FOR TYPE 2 DIABETES MELLITUS WITH HYPERGLYCEMIA (HCC) WRITTEN ON 08/07/2022  4:34 PM BY DEW, JASON S, MD  blood glucose control important in reducing the progression of atherosclerotic disease. Also, involved in wound healing. On appropriate medications.

## 2022-08-07 NOTE — Assessment & Plan Note (Signed)
Has been told he had this previously.  Has leg pain that I think is neuropathy, but we will check ABIs to be sure.

## 2022-08-07 NOTE — Assessment & Plan Note (Signed)
blood glucose control important in reducing the progression of atherosclerotic disease. Also, involved in wound healing. On appropriate medications.  

## 2022-08-07 NOTE — Assessment & Plan Note (Signed)
On the large portion of his leg swelling is from poor cardiac function and congestive heart failure.  At this point, his swelling is better but certainly far from gone.  He is in cardiac rehab and continued support of his cardiac function will be helpful for his overall swelling as well.

## 2022-08-07 NOTE — Assessment & Plan Note (Signed)
blood pressure control important in reducing the progression of atherosclerotic disease. On appropriate oral medications.  

## 2022-08-09 ENCOUNTER — Other Ambulatory Visit: Payer: Self-pay

## 2022-08-09 MED FILL — Alprazolam Tab 1 MG: ORAL | 30 days supply | Qty: 45 | Fill #1 | Status: AC

## 2022-08-15 ENCOUNTER — Encounter: Payer: Self-pay | Admitting: *Deleted

## 2022-08-15 ENCOUNTER — Ambulatory Visit (INDEPENDENT_AMBULATORY_CARE_PROVIDER_SITE_OTHER): Payer: Medicaid Other

## 2022-08-15 ENCOUNTER — Encounter: Payer: Medicaid Other | Admitting: *Deleted

## 2022-08-15 DIAGNOSIS — R600 Localized edema: Secondary | ICD-10-CM | POA: Diagnosis not present

## 2022-08-15 DIAGNOSIS — I739 Peripheral vascular disease, unspecified: Secondary | ICD-10-CM

## 2022-08-15 DIAGNOSIS — I5042 Chronic combined systolic (congestive) and diastolic (congestive) heart failure: Secondary | ICD-10-CM

## 2022-08-15 DIAGNOSIS — I519 Heart disease, unspecified: Secondary | ICD-10-CM | POA: Diagnosis not present

## 2022-08-15 DIAGNOSIS — I5022 Chronic systolic (congestive) heart failure: Secondary | ICD-10-CM | POA: Diagnosis not present

## 2022-08-15 NOTE — Progress Notes (Signed)
Discharge Summary   Kashis Penley (DOB: 2066-01-10)  Shanon Brow graduated today from  rehab with 36 sessions completed.  Details of the patient's exercise prescription and what He needs to do in order to continue the prescription and progress were discussed with patient.  Patient was given a copy of prescription and goals.  Patient verbalized understanding.  [place] plans to continue to exercise by joining the [employer].   [place] Name 05/30/22 1529 07/30/22 1558       6 Minute Walk   Phase Initial Discharge    Distance 1075 feet 1320 feet    Distance % Change -- 23 %    Distance Feet Change -- 245 ft    Walk Time 6 minutes 6 minutes    # of Rest Breaks 0 0    MPH 2.04 2.5    METS 2.86 3.46    RPE 13 11    Perceived Dyspnea  3 0    VO2 Peak 10 12.11    Symptoms Yes (comment) No    Comments SOB, hip, knee, foot pain/numbness --    Resting HR 70 bpm 90 bpm    Resting BP 100/52 112/72    Resting Oxygen Saturation  99 % 96 %    Exercise Oxygen Saturation  during 6 min walk 100 % 94 %    Max Ex. HR 98 bpm 110 bpm    Max Ex. BP 114/64 130/72    2 Minute Post BP 106/58 --

## 2022-08-15 NOTE — Progress Notes (Signed)
Cardiac Individual Treatment Plan  Patient Details  Name: Lawrence Murray MRN: [id number] Date of Birth: Apr 29, 1966 Referring Provider:   Flowsheet Row Cardiac Rehab from 05/30/2022 in [facility] Cardiac and Pulmonary Rehab  Referring Provider Ernestina Penna MD       Initial Encounter Date:  Flowsheet Row Cardiac Rehab from 05/30/2022 in [facility] Cardiac and Pulmonary Rehab  Date 05/30/22       Visit Diagnosis: Chronic combined systolic and diastolic heart failure ([place])  Patient's Home Medications on Admission:  Current Outpatient Medications:    Accu-Chek Softclix Lancets lancets, Use as directed to check blood glucose daily, Disp: 100 each, Rfl: 0   acetaminophen (TYLENOL) 500 MG tablet, Take 2 tablets (1,000 mg total) by mouth every 6 (six) hours as needed for mild pain, moderate pain, fever or headache., Disp: 30 tablet, Rfl: 0   ALPRAZolam (XANAX) 1 MG tablet, Take 0.5 tablets (0.5 mg total) by mouth 3 (three) times daily as needed for anxiety., Disp: 90 tablet, Rfl: 0   aspirin EC 81 MG tablet, Take 1 tablet (81 mg total) by mouth daily. Swallow whole., Disp: 90 tablet, Rfl: 2   atorvastatin (LIPITOR) 80 MG tablet, Take 1 tablet (80 mg total) by mouth daily., Disp: 90 tablet, Rfl: 2   Blood Glucose Monitoring Suppl (ACCU-CHEK GUIDE ME) w/Device KIT, Use as directed daily, Disp: 1 kit, Rfl: 0   dapagliflozin propanediol (FARXIGA) 10 MG TABS tablet, Take one tablet by mouth daily, Disp: 90 tablet, Rfl: 3   digoxin (LANOXIN) 0.125 MG tablet, Take 1 tablet (0.125 mg total) by mouth daily., Disp: 90 tablet, Rfl: 2   docusate sodium (STOOL SOFTENER) 250 MG capsule, Take 250 mg by mouth daily., Disp: , Rfl:    furosemide (LASIX) 40 MG tablet, Take 1.5 tablets (60 mg total) by mouth daily., Disp: 135 tablet, Rfl: 3   gabapentin (NEURONTIN) 100 MG capsule, Take 1 capsule (100 mg total) by mouth 3 (three) times daily., Disp: 60 capsule, Rfl: 1   glucose blood test strip, Use as directed once  daily, Disp: 100 each, Rfl: 0   metFORMIN (GLUCOPHAGE) 500 MG tablet, Take 1 tablet (500 mg total) by mouth 2 (two) times daily., Disp: 180 tablet, Rfl: 3   metoprolol succinate (TOPROL-XL) 25 MG 24 hr tablet, Take 0.5 tablets (12.5 mg total) by mouth daily., Disp: 45 tablet, Rfl: 2   pantoprazole (PROTONIX) 40 MG tablet, Take 1 tablet (40 mg total) by mouth daily., Disp: 30 tablet, Rfl: 11   potassium chloride SA (KLOR-CON M) 20 MEQ tablet, Take 1 tablet (20 mEq total) by mouth daily., Disp: 90 tablet, Rfl: 2   spironolactone (ALDACTONE) 25 MG tablet, Take 0.5 tablets (12.5 mg total) by mouth daily., Disp: 45 tablet, Rfl: 2   Zoster Vaccine Adjuvanted (SHINGRIX) injection, Inject into the muscle., Disp: 0.5 mL, Rfl: 1  Past Medical History: Past Medical History:  Diagnosis Date   CHF (congestive heart failure) (HCC)    Coronary artery disease    Diabetes mellitus without complication ([place])    Hypertension     Tobacco Use: Social History   Tobacco Use  Smoking Status Former   Packs/day: 1.00   Years: 32.00   Total pack years: 32.00   Types: Cigarettes   Quit date: 02/04/2022   Years since quitting: 0.5  Smokeless Tobacco Never    Labs: Review Flowsheet       Latest Ref Rng & Units 02/05/2022 02/09/2022 02/24/2022  Labs for ITP Cardiac and Pulmonary Rehab  Cholestrol 0 - 200 mg/dL - - 242   LDL (calc) 0 - 99 mg/dL - - 160   HDL-C >40 mg/dL - - 35   Trlycerides <150 mg/dL - - 235   Hemoglobin A1c 4.8 - 5.6 % 7.7  - -  PH, Arterial 7.35 - 7.45 - 7.52  -  PCO2 arterial 32 - 48 mmHg - 47  -  Bicarbonate 20.0 - 28.0 mmol/L - 38.4  -  O2 Saturation % - 97.6  -     Exercise Target Goals: Exercise Program Goal: Individual exercise prescription set using results from initial 6 min walk test and THRR while considering  patient's activity barriers and safety.   Exercise Prescription Goal: Initial exercise prescription builds to 30-45 minutes a day of aerobic activity, 2-3 days  per week.  Home exercise guidelines will be given to patient during program as part of exercise prescription that the participant will acknowledge.   Education: Aerobic Exercise: - Group verbal and visual presentation on the components of exercise prescription. Introduces F.I.T.T principle from ACSM for exercise prescriptions.  Reviews F.I.T.T. principles of aerobic exercise including progression. Written material given at graduation. Flowsheet Row Cardiac Rehab from 08/01/2022 in [facility] Cardiac and Pulmonary Rehab  Date 06/27/22  Educator [place]  Instruction Review Code 1- Verbalizes Understanding       Education: Resistance Exercise: - Group verbal and visual presentation on the components of exercise prescription. Introduces F.I.T.T principle from ACSM for exercise prescriptions  Reviews F.I.T.T. principles of resistance exercise including progression. Written material given at graduation. Flowsheet Row Cardiac Rehab from 08/01/2022 in [facility] Cardiac and Pulmonary Rehab  Date 07/11/22  Educator KW  Instruction Review Code 1- [employer] Understanding        Education: Exercise & Equipment Safety: - Individual verbal instruction and demonstration of equipment use and safety with use of the equipment. Flowsheet Row Cardiac Rehab from 08/01/2022 in [facility] Cardiac and Pulmonary Rehab  Date 05/21/22  Educator [facility]  Instruction Review Code 1- Verbalizes Understanding       Education: Exercise Physiology & General Exercise Guidelines: - Group verbal and written instruction with models to review the exercise physiology of the cardiovascular system and associated critical values. Provides general exercise guidelines with specific guidelines to those with heart or lung disease.  Flowsheet Row Cardiac Rehab from 08/01/2022 in [facility] Cardiac and Pulmonary Rehab  Date 06/20/22  Educator [place]  Instruction Review Code 1- Verbalizes Understanding       Education: Flexibility, Balance, Mind/Body  Relaxation: - Group verbal and visual presentation with interactive activity on the components of exercise prescription. Introduces F.I.T.T principle from ACSM for exercise prescriptions. Reviews F.I.T.T. principles of flexibility and balance exercise training including progression. Also discusses the mind body connection.  Reviews various relaxation techniques to help reduce and manage stress (i.e. Deep breathing, progressive muscle relaxation, and visualization). Balance handout provided to take home. Written material given at graduation. Flowsheet Row Cardiac Rehab from 08/01/2022 in [facility] Cardiac and Pulmonary Rehab  Date 07/11/22  Educator KW  Instruction Review Code 1- Verbalizes Understanding       Activity Barriers & Risk Stratification:  Activity Barriers & Cardiac Risk Stratification - 05/30/22 1531       Activity Barriers & Cardiac Risk Stratification   Activity Barriers Joint Problems;Deconditioning;Muscular Weakness;Shortness of Breath;Other (comment)    Comments Neuropathy, Edema, PAD    Cardiac Risk Stratification High             6 Minute Walk:  [place] Name 05/30/22 1529 07/30/22 1558       6 Minute Walk   Phase Initial Discharge    Distance 1075 feet 1320 feet    Distance % Change -- 23 %    Distance Feet Change -- 245 ft    Walk Time 6 minutes 6 minutes    # of Rest Breaks 0 0    MPH 2.04 2.5    METS 2.86 3.46    RPE 13 11    Perceived Dyspnea  3 0    VO2 Peak 10 12.11    Symptoms Yes (comment) No    Comments SOB, hip, knee, foot pain/numbness --    Resting HR 70 bpm 90 bpm    Resting BP 100/52 112/72    Resting Oxygen Saturation  99 % 96 %    Exercise Oxygen Saturation  during 6 min walk 100 % 94 %    Max Ex. HR 98 bpm 110 bpm    Max Ex. BP 114/64 130/72    2 Minute Post BP 106/58 --             Oxygen Initial Assessment:   Oxygen Re-Evaluation:   Oxygen Discharge (Final Oxygen Re-Evaluation):   Initial Exercise  Prescription:  Initial Exercise Prescription - 05/30/22 1500       Date of Initial Exercise RX and Referring Provider   Date 05/30/22    Referring Provider Ernestina Penna MD      Oxygen   Maintain Oxygen Saturation 88% or higher      Treadmill   MPH 2    Grade 0    Minutes 15    METs 2.53      REL-XR   Level 1    Speed 50    Minutes 15    METs 2.86      T5 Nustep   Level 1    SPM 80    Minutes 15    METs 2.86      Prescription Details   Frequency (times per week) 3    Duration Progress to 30 minutes of continuous aerobic without signs/symptoms of physical distress      Intensity   THRR 40-80% of Max Heartrate 108-146    Ratings of Perceived Exertion 11-13    Perceived Dyspnea 0-4      Progression   Progression Continue to progress workloads to maintain intensity without signs/symptoms of physical distress.      Resistance Training   Training Prescription Yes    Weight 4 lb    Reps 10-15             Perform Capillary Blood Glucose checks as needed.  Exercise Prescription Changes:   Exercise Prescription Changes     Row Name 05/30/22 1500 06/19/22 1500 07/16/22 1400 07/18/22 1500 08/01/22 1400     Response to Exercise   Blood Pressure (Admit) 100/52 110/64 110/60 -- 124/62   Blood Pressure (Exercise) 114/64 118/72 134/70 -- --   Blood Pressure (Exit) 106/58 108/62 104/64 -- 124/62   Heart Rate (Admit) 70 bpm 74 bpm 87 bpm -- 83 bpm   Heart Rate (Exercise) 98 bpm 137 bpm 128 bpm -- 129 bpm   Heart Rate (Exit) 69 bpm 99 bpm 98 bpm -- 99 bpm   Oxygen Saturation (Admit) 99 % -- -- -- --   Oxygen Saturation (Exercise) 100 % -- -- -- --   Rating of Perceived Exertion (Exercise) 13  15 15 -- 15   Perceived Dyspnea (Exercise) 3 -- -- -- --   Symptoms SOB, Hip, knee, feet pain/tightness -- -- -- --   Comments 6MWT Results 5th full day of exercise -- -- --   Duration -- Continue with 30 min of aerobic exercise without signs/symptoms of physical  distress. Continue with 30 min of aerobic exercise without signs/symptoms of physical distress. -- Continue with 30 min of aerobic exercise without signs/symptoms of physical distress.   Intensity -- THRR unchanged THRR unchanged -- THRR unchanged     Progression   Progression -- Continue to progress workloads to maintain intensity without signs/symptoms of physical distress. Continue to progress workloads to maintain intensity without signs/symptoms of physical distress. -- Continue to progress workloads to maintain intensity without signs/symptoms of physical distress.   Average METs -- 2.81 3.57 -- 4.69     Resistance Training   Training Prescription -- Yes Yes -- Yes   Weight -- 4 lb 4 lb -- 7 lb   Reps -- 10-15 10-15 -- 10-15     Interval Training   Interval Training -- No No -- Yes   Equipment -- -- -- -- REL-XR   Comments -- -- -- -- intervals levels 6-11     Treadmill   MPH -- 2 2.5 -- 2.3   Grade -- 0.5 4 -- 4.5   Minutes -- 15 15 -- 15   METs -- 2.67 4.29 -- 4.19     REL-XR   Level -- 2 11 -- 11   Minutes -- 15 15 -- 15   METs -- 3.5 6.1 -- 7.5     T5 Nustep   Level -- 1 4 -- 5   Minutes -- 15 15 -- 15   METs -- 2.77 2.9 -- 3.3     Home Exercise Plan   Plans to continue exercise at -- -- -- No Name on joining the [place] and using their aerobic machines. [place] on joining the [place] and using their aerobic machines.   Frequency -- -- -- Add 2 additional days to program exercise sessions. Add 2 additional days to program exercise sessions.   Initial Home Exercises Provided -- -- -- 07/18/22 07/18/22     Oxygen   Maintain Oxygen Saturation -- 88% or higher 88% or higher 88% or higher 88% or higher            Exercise Comments:   Exercise Comments     Row Name 05/31/22 1534           Exercise Comments First full day of exercise!  Patient was oriented to gym and equipment including functions, settings,  policies, and procedures.  Patient's individual exercise prescription and treatment plan were reviewed.  All starting workloads were established based on the results of the 6 minute walk test done at initial orientation visit.  The plan for exercise progression was also introduced and progression will be customized based on patient's performance and goals.                Exercise Goals and Review:   Exercise Goals     Row Name 05/30/22 1546             Exercise Goals   Increase Physical Activity Yes       Intervention Develop an individualized exercise prescription for aerobic and resistive training based on initial evaluation findings, risk stratification, comorbidities and participant's personal  goals.;Provide advice, education, support and counseling about physical activity/exercise needs.       Expected Outcomes Short Term: Attend rehab on a regular basis to increase amount of physical activity.;Long Term: Add in home exercise to make exercise part of routine and to increase amount of physical activity.;Long Term: Exercising regularly at least 3-5 days a week.       Increase Strength and Stamina Yes       Intervention Provide advice, education, support and counseling about physical activity/exercise needs.;Develop an individualized exercise prescription for aerobic and resistive training based on initial evaluation findings, risk stratification, comorbidities and participant's personal goals.       Expected Outcomes Short Term: Increase workloads from initial exercise prescription for resistance, speed, and METs.;Short Term: Perform resistance training exercises routinely during rehab and add in resistance training at home;Long Term: Improve cardiorespiratory fitness, muscular endurance and strength as measured by increased METs and functional capacity (6MWT)       Able to understand and use rate of perceived exertion (RPE) scale Yes       Intervention Provide education and explanation  on how to use RPE scale       Expected Outcomes Short Term: Able to use RPE daily in rehab to express subjective intensity level;Long Term:  Able to use RPE to guide intensity level when exercising independently       Able to understand and use Dyspnea scale Yes       Intervention Provide education and explanation on how to use Dyspnea scale       Expected Outcomes Long Term: Able to use Dyspnea scale to guide intensity level when exercising independently;Short Term: Able to use Dyspnea scale daily in rehab to express subjective sense of shortness of breath during exertion       Knowledge and understanding of Target Heart Rate Range (THRR) Yes       Intervention Provide education and explanation of THRR including how the numbers were predicted and where they are located for reference       Expected Outcomes Long Term: Able to use THRR to govern intensity when exercising independently;Short Term: Able to state/look up THRR;Short Term: Able to use daily as guideline for intensity in rehab       Able to check pulse independently Yes       Intervention Review the importance of being able to check your own pulse for safety during independent exercise;Provide education and demonstration on how to check pulse in carotid and radial arteries.       Expected Outcomes Short Term: Able to explain why pulse checking is important during independent exercise;Long Term: Able to check pulse independently and accurately       Understanding of Exercise Prescription Yes       Intervention Provide education, explanation, and written materials on patient's individual exercise prescription       Expected Outcomes Short Term: Able to explain program exercise prescription;Long Term: Able to explain home exercise prescription to exercise independently                Exercise Goals Re-Evaluation :  Exercise Goals Re-Evaluation     Row Name 05/31/22 1534 06/19/22 1521 07/16/22 1421 07/18/22 1559 08/01/22 1450      Exercise Goal Re-Evaluation   Exercise Goals Review Able to understand and use rate of perceived exertion (RPE) scale;Able to understand and use Dyspnea scale;Knowledge and understanding of Target Heart Rate Range (THRR);Understanding of Exercise Prescription Increase Physical Activity;Increase Strength and  Stamina;Understanding of Exercise Prescription Increase Physical Activity;Increase Strength and Stamina;Understanding of Exercise Prescription Increase Physical Activity;Increase Strength and Stamina;Understanding of Exercise Prescription;Able to understand and use Dyspnea scale;Able to check pulse independently;Able to understand and use rate of perceived exertion (RPE) scale;Knowledge and understanding of Target Heart Rate Range (THRR) Increase Physical Activity;Increase Strength and Stamina;Understanding of Exercise Prescription   Comments Reviewed RPE scale, THR and program prescription with pt today.  Pt voiced understanding and was given a copy of goals to take home. Patrik is doing well for the first couple of weeks that he has been here. He has been able to follow his initial exercise prescription well, and even able to add a 0.5% incline to his treadmill workload. He also increased to level 2 on the XR. We will continue to monitor as he progresses in our program. Raliegh continues to do well. Since last review, patient has increased on all his workloads on each exercise machine. He is up to level 11 on the XR, working over 6 METS!! His incline on the treadmill increased to 4%. The T5 Nustep is now up to level 4. He continues to hit his THR each session. We will continue to monitor. Reviewed home exercise with pt today.  Pt plans to join the [place] [facility] and use the aerobic machines for exercise.  Reviewed THR, pulse, RPE, sign and symptoms, pulse oximetery and when to call 911 or MD.  Also discussed weather considerations and indoor options.  Pt voiced understanding. Kolsen continues to do well in  rehab. He recently completed his post 6MWT and improved by 23%! He also increased his overall average MET level to 4.69 METs. He has progressed to level 5 on the T5 as well, and began doing intervals on the XR between levels 6-11. He also began using 7 lb hand weights for resistance training. We will continue to monitor his progress.   Expected Outcomes Short: Use RPE daily to regulate intensity.  Long: Follow program prescription in THR. Short: Continue to increase workload on treadmill Long: Continue to improve overall strength and stamina Short: Increase handweights to 5 lb Long: Continue to increase overall MET level Short: Add 2 days of exercise on days away from rehab. Long: Become independent with exercise. Short: Continue to work towards graduation. Long: Continue to become more independent with exercise.            Discharge Exercise Prescription (Final Exercise Prescription Changes):  Exercise Prescription Changes - 08/01/22 1400       Response to Exercise   Blood Pressure (Admit) 124/62    Blood Pressure (Exit) 124/62    Heart Rate (Admit) 83 bpm    Heart Rate (Exercise) 129 bpm    Heart Rate (Exit) 99 bpm    Rating of Perceived Exertion (Exercise) 15    Duration Continue with 30 min of aerobic exercise without signs/symptoms of physical distress.    Intensity THRR unchanged      Progression   Progression Continue to progress workloads to maintain intensity without signs/symptoms of physical distress.    Average METs 4.69      Resistance Training   Training Prescription Yes    Weight 7 lb    Reps 10-15      Interval Training   Interval Training Yes    Equipment REL-XR    Comments intervals levels 6-11      Treadmill   MPH 2.3    Grade 4.5    Minutes 15  METs 4.19      REL-XR   Level 11    Minutes 15    METs 7.5      T5 Nustep   Level 5    Minutes 15    METs 3.3      Home Exercise Plan   Plans to continue exercise at [place] on  joining the [place] and using their aerobic machines.   Frequency Add 2 additional days to program exercise sessions.    Initial Home Exercises Provided 07/18/22      Oxygen   Maintain Oxygen Saturation 88% or higher             Nutrition:  Target Goals: Understanding of nutrition guidelines, daily intake of sodium <1552m, cholesterol <2051m calories 30% from fat and 7% or less from saturated fats, daily to have 5 or more servings of fruits and vegetables.  Education: All About Nutrition: -Group instruction provided by verbal, written material, interactive activities, discussions, models, and posters to present general guidelines for heart healthy nutrition including fat, fiber, MyPlate, the role of sodium in heart healthy nutrition, utilization of the nutrition label, and utilization of this knowledge for meal planning. Follow up email sent as well. Written material given at graduation. [facility]  Date 07/04/22  [part 1 07/04/22,  part 2 07/18/22]  Educator [facility] Center[facility]ruction Review Code 1- Verbalizes Understanding       Biometrics:  Pre Biometrics - 05/30/22 1547       Pre Biometrics   Height 6' 0.25" (1.835 m)    Weight 254 lb 14.4 oz (115.6 kg)    Waist Circumference 59.5 inches    Hip Circumference 43 inches    Waist to Hip Ratio 1.38 %    BMI (Calculated) 34.34    Single Leg Stand 26.2 seconds   Right            Post Biometrics - 07/30/22 1600        Post  Biometrics   Height 6' 0.25" (1.835 m)    Weight 261 lb 3.2 oz (118.5 kg)    Waist Circumference 51.5 inches    Hip Circumference 42 inches    Waist to Hip Ratio 1.23 %    BMI (Calculated) 35.19    Single Leg Stand 30 seconds             Nutrition Therapy Plan and Nutrition Goals:  Nutrition Therapy & Goals - 05/30/22 1649       Personal Nutrition Goals   Comments [place] feeling overwhelmed with his hospitalization  this summer, quitting smoking, 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 [place] quitting smoking cold [place] hard, but he was not interested in interventions such as the patch due to potential side effects. He reports this was easier in the hospital due to less temptation and now has difficulty on a regular basis; after eating he will want to smoke, but instead will continue eating to curb this. [place] he tried to take care of himself by being an avid onEngineer, manufacturingn various subjects such as nutrition in addition to meeting with an RD at the hospital as well as in the outpatient setting; he feels he knows how to be healthy, it is just a  matter of doing it. This RD briefly reviewed some tactics to save on time and money while trying to eat healthy. Osiris reports sleeping during the morning hours as his son is at school and his father has an aide to help him so he is able to relax and sleep; he does not end up going to sleep until around 5-7am and will sleep until about 2pm. When he takes his medication he tries to have something in his stomach like a banana. He has his biggest meal in the evening around 7pm where he will have some protein such as pork chops, steak, or chicken with some vegetables (he does not care for frozen and will try to buy many fresh) and potentially a carbohydrate rich food like rice or starchy vegetables; he reports that this meal may be consisdered to be multiple portions by other people - reviewed meal structure such as being mindful of CHO quantity in one sitting. He reports not using fat such as oil or butter with cooking, not eating fried foods, and trying to eat balanced meals using a [place] [employer]. Snacks will vary with choices such as fruit, ice cream, cookies, and vanilla waffers. He reports drinking water throughout the day. Encouraged Trashawn  to attend the educational sessions as he is able to and pointed out the nutrition education was in November. Vasily reports that his preference is to not receive physical copies of handouts, provided him with youtube channel and sent email with informational handouts on nutrition. Since meeting with this RD he reported not going to outpatient RD, but he still RD information so that he can contact them when needed especially post cardiac rehab.             Nutrition Assessments:  MEDIFICTS Score Key: ?70 Need to make dietary changes  40-70 Heart Healthy Diet ? 40 Therapeutic Level Cholesterol Diet  Flowsheet Row Cardiac Rehab from 05/30/2022 in [facility] Cardiac and Pulmonary Rehab  Picture Your Plate Total Score on Admission 55      Picture Your Plate Scores: <53 Unhealthy dietary pattern with much room for improvement. 41-50 Dietary pattern unlikely to meet recommendations for good health and room for improvement. 51-60 More healthful dietary pattern, with some room for improvement.  >60 Healthy dietary pattern, although there may be some specific behaviors that could be improved.    Nutrition Goals Re-Evaluation:  Nutrition Goals Re-Evaluation     [place] Name 07/05/22 1555 08/01/22 1553           Goals   Current Weight 261 lb (118.4 kg) 261 lb (118.4 kg)      Nutrition Goal Cut back on portions. Cut back on portions.      Comment Stan eats out anywhere from 2-3 times a week depending when his son is with him. He wants to lose more weight but has appointments and his schedule keeps him from eating regularly. Grover states he is doing better with eating and has improved his eating habits.      Expected Outcome Short: eat smaller potions and lose some weight. Long: adhere to a diet that pertains to him. Short: lose more weight. Long: maintain diet and weightloss independently.               Nutrition Goals Discharge (Final Nutrition Goals Re-Evaluation):  Nutrition Goals  Re-Evaluation - 08/01/22 1553       Goals   Current Weight 261 lb (118.4 kg)    Nutrition Goal Cut back on  portions.    Comment Vuong states he is doing better with eating and has improved his eating habits.    Expected Outcome Short: lose more weight. Long: maintain diet and weightloss independently.             Psychosocial: Target Goals: Acknowledge presence or absence of significant depression and/or stress, maximize coping skills, provide positive support system. Participant is able to verbalize types and ability to use techniques and skills needed for reducing stress and depression.   Education: Stress, Anxiety, and Depression - Group verbal and visual presentation to define topics covered.  Reviews how body is impacted by stress, anxiety, and depression.  Also discusses healthy ways to reduce stress and to treat/manage anxiety and depression.  Written material given at graduation.   Education: Sleep Hygiene -Provides group verbal and written instruction about how sleep can affect your health.  Define sleep hygiene, discuss sleep cycles and impact of sleep habits. Review good sleep hygiene tips.    Initial Review & Psychosocial Screening:  Initial Psych Review & Screening - 05/21/22 1119       Initial Review   Current issues with Current Sleep Concerns;Current Psychotropic Meds;History of Depression;Current Depression;Current Anxiety/Panic;Current Stress Concerns    Source of Stress Concerns Chronic Illness;Family    Comments Kordell is taking care of his dad routnely and lost track of his own health. He was in and out of court for custody and child support in the past. He has not seen a doctor since the [place] and was the last time he had insurance. His dad and son live with him. They are all living off of his fathers social securtiy. He states taking care of his father has drained him financially, phyically and mentaly.      Family Dynamics   Good Support System? No     Strains Intra-family strains;Illness and family care strain      Barriers   Psychosocial barriers to participate in program The patient should benefit from training in stress management and relaxation.      Screening Interventions   Interventions Encouraged to exercise;To provide support and resources with identified psychosocial needs;Provide feedback about the scores to participant    Expected Outcomes Short Term goal: Utilizing psychosocial counselor, staff and physician to assist with identification of specific Stressors or current issues interfering with healing process. Setting desired goal for each stressor or current issue identified.;Long Term Goal: Stressors or current issues are controlled or eliminated.;Short Term goal: Identification and review with participant of any Quality of Life or Depression concerns found by scoring the questionnaire.;Long Term goal: The participant improves quality of Life and PHQ9 Scores as seen by post scores and/or verbalization of changes             Quality of Life Scores:   Quality of Life - 05/30/22 1549       Quality of Life   Select Quality of Life      Quality of Life Scores   Health/Function Pre 11.1 %    Socioeconomic Pre 15.5 %    Psych/Spiritual Pre 7.29 %    Family Pre 10.2 %    GLOBAL Pre 11.21 %            Scores of 19 and below usually indicate a poorer quality of life in these areas.  A difference of  2-3 points is a clinically meaningful difference.  A difference of 2-3 points in the total score of the Quality of Life Index  has been associated with significant improvement in overall quality of life, self-image, physical symptoms, and general health in studies assessing change in quality of life.  PHQ-9: Review Flowsheet  More data may exist      07/23/2022 06/25/2022 05/30/2022 05/11/2022 05/03/2022  Depression screen PHQ 2/9  Decreased Interest _0 0 0  Down, Depressed, Hopeless _1 0  PHQ - 2 Score _2 0   Altered sleeping _3 -  Tired, decreased energy _4 -  Change in appetite _5 -  Feeling bad or failure about yourself  _6 -  Trouble concentrating _7 -  Moving slowly or fidgety/restless 1 0 2 1 -  Suicidal thoughts 0 0 0 0 -  PHQ-9 Score _8 -  Difficult doing work/chores Very difficult Very difficult Very difficult Extremely dIfficult -   Interpretation of Total Score  Total Score Depression Severity:  1-4 = Minimal depression, 5-9 = Mild depression, 10-14 = Moderate depression, 15-19 = Moderately severe depression, 20-27 = Severe depression   Psychosocial Evaluation and Intervention:  Psychosocial Evaluation - 05/21/22 1124       Psychosocial Evaluation & Interventions   Interventions Encouraged to exercise with the program and follow exercise prescription;Stress management education;Relaxation education    Comments Ilai is taking care of his dad routnely and lost track of his own health. He was in and out of court for custody and child support in the past. He has not seen a doctor since the [place] and was the last time he had insurance. His dad and son live with him. They are all living off of his fathers social securtiy. He states taking care of his father has drained him financially, phyically and mentaly.    Expected Outcomes Short: Start HeartTrack to help with mood. Long: Maintain a healthy mental state    Continue Psychosocial Services  Follow up required by staff             Psychosocial Re-Evaluation:  Psychosocial Re-Evaluation     [place] Name 07/05/22 1548 08/01/22 1551           Psychosocial Re-Evaluation   Current issues with Current Depression;History of Depression;Current Anxiety/Panic;Current Psychotropic Meds;Current Sleep Concerns;Current Stress Concerns Current Depression;History of Depression;Current Anxiety/Panic;Current Psychotropic Meds;Current Sleep Concerns;Current Stress Concerns      Comments Buster is not sleeping  well and he feels more anxiety. Alot of his anxiety is just "there". His dads health is not doing well and he is the primary care giver. He has been in and out of court for custody of his son in the past. He has not been able to have a life for himself due to his dad living with him for 11 years. Jack states that there is no canges since his last review.      Expected Outcomes Short: Attend HeartTrack stress management education to decrease stress. Long: Maintain exercise Post HeartTrack to keep stress at a minimum. Short: Attend HeartTrack stress management education to decrease stress. Long: Maintain exercise Post HeartTrack to keep stress at a minimum.      Interventions Encouraged to attend Cardiac Rehabilitation for the exercise Encouraged to attend Cardiac Rehabilitation for the exercise      Continue Psychosocial Services  Follow up required by staff Follow up required by staff  Psychosocial Discharge (Final Psychosocial Re-Evaluation):  Psychosocial Re-Evaluation - 08/01/22 1551       Psychosocial Re-Evaluation   Current issues with Current Depression;History of Depression;Current Anxiety/Panic;Current Psychotropic Meds;Current Sleep Concerns;Current Stress Concerns    Comments [place] states that there is no canges since his last review.    Expected Outcomes Short: Attend HeartTrack stress management education to decrease stress. Long: Maintain exercise Post HeartTrack to keep stress at a minimum.    Interventions Encouraged to attend Cardiac Rehabilitation for the exercise    Continue Psychosocial Services  Follow up required by staff             Vocational Rehabilitation: Provide vocational rehab assistance to qualifying candidates.   Vocational Rehab Evaluation & Intervention:   Education: Education Goals: Education classes will be provided on a variety of topics geared toward better understanding of heart health and risk factor modification. Participant  will state understanding/return demonstration of topics presented as noted by education test scores.  Learning Barriers/Preferences:  Learning Barriers/Preferences - 05/21/22 1115       Learning Barriers/Preferences   Learning Barriers None    Learning Preferences None             General Cardiac Education Topics:  AED/CPR: - Group verbal and written instruction with the use of models to demonstrate the basic use of the AED with the basic ABC's of resuscitation.   Anatomy and Cardiac Procedures: - Group verbal and visual presentation and models provide information about basic cardiac anatomy and function. Reviews the testing methods done to diagnose heart disease and the outcomes of the test results. Describes the treatment choices: Medical Management, Angioplasty, or Coronary Bypass Surgery for treating various heart conditions including Myocardial Infarction, Angina, Valve Disease, and Cardiac Arrhythmias.  Written material given at graduation. Flowsheet Row Cardiac Rehab from 08/01/2022 in [facility] Re[facility]  Education need identified 05/30/22  Date 07/25/22  Educator SB  Instruction Review Code 1- Verbalizes Understanding       Medication Safety: - Group verbal and visual instruction to review commonly prescribed medications for heart and lung disease. Reviews the medication, class of the drug, and side effects. Includes the steps to properly store meds and maintain the prescription regimen.  Written material given at graduation. Flowsheet Row Cardiac Rehab from 08/01/2022 in [facility] Cardiac and Pulmonary Rehab  Date 08/01/22  Educator SB  Instruction Review Code 1- Verbalizes Understanding       Intimacy: - Group verbal instruction through game format to discuss how heart and lung disease can affect sexual intimacy. Written material given at graduation.. Flowsheet Row Cardiac Rehab from 08/01/2022 in [facility] Cardiac and Pulmonary Rehab  Date 06/27/22   Educator [place]  Instruction Review Code 1- Verbalizes Understanding       Know Your Numbers and Heart Failure: - Group verbal and visual instruction to discuss disease risk factors for cardiac and pulmonary disease and treatment options.  Reviews associated critical values for Overweight/Obesity, Hypertension, Cholesterol, and Diabetes.  Discusses basics of heart failure: signs/symptoms and treatments.  Introduces Heart Failure Zone chart for action plan for heart failure.  Written material given at graduation.   Infection Prevention: - Provides verbal and written material to individual with discussion of infection control including proper hand washing and proper equipment cleaning during exercise session. Flowsheet Row Cardiac Rehab from 08/01/2022 in [facility] Cardiac and Pulmonary Rehab  Date 05/21/22  Educator [facility]  Instruction Review Code 1- [employer]  Prevention: - Provides verbal and written material to individual with discussion of falls prevention and safety. Flowsheet Row Cardiac Rehab from 08/01/2022 in [facility] Cardiac and Pulmonary Rehab  Date 05/21/22  Educator [facility]  Instruction Review Code 1- Verbalizes Understanding       Other: -Provides group and verbal instruction on various topics (see comments)   Knowledge Questionnaire Score:  Knowledge Questionnaire Score - 05/30/22 1558       Knowledge Questionnaire Score   Pre Score 23/26             Core Components/Risk Factors/Patient Goals at Admission:  Personal Goals and Risk Factors at Admission - 05/30/22 1558       Core Components/Risk Factors/Patient Goals on Admission    Weight Management Yes;Weight Loss    Intervention Weight Management: Develop a combined nutrition and exercise program designed to reach desired caloric intake, while maintaining appropriate intake of nutrient and fiber, sodium and fats, and appropriate energy expenditure required for the weight goal.;Weight  Management: Provide education and appropriate resources to help participant work on and attain dietary goals.;Weight Management/Obesity: Establish reasonable short term and long term weight goals.    Admit Weight 254 lb 14.4 oz (115.6 kg)    Goal Weight: Short Term 250 lb (113.4 kg)    Goal Weight: Long Term 240 lb (108.9 kg)    Expected Outcomes Short Term: Continue to assess and modify interventions until short term weight is achieved;Long Term: Adherence to nutrition and physical activity/exercise program aimed toward attainment of established weight goal;Weight Loss: Understanding of general recommendations for a balanced deficit meal plan, which promotes 1-2 lb weight loss per week and includes a negative energy balance of [phone] kcal/d;Understanding recommendations for meals to include 15-35% energy as protein, 25-35% energy from fat, 35-60% energy from carbohydrates, less than 219m of dietary cholesterol, 20-35 gm of total fiber daily;Understanding of distribution of calorie intake throughout the day with the consumption of 4-5 meals/snacks    Tobacco Cessation Yes    Number of packs per day 0    Intervention Assist the participant in steps to quit. Provide individualized education and counseling about committing to Tobacco Cessation, relapse prevention, and pharmacological support that can be provided by physician.;OAdvice worker assist with locating and accessing local/national Quit Smoking programs, and support quit date choice.    Expected Outcomes Short Term: Will demonstrate readiness to quit, by selecting a quit date.;Short Term: Will quit all tobacco product use, adhering to prevention of relapse plan.;Long Term: Complete abstinence from all tobacco products for at least 12 months from quit date.    Diabetes Yes    Intervention Provide education about signs/symptoms and action to take for hypo/hyperglycemia.;Provide education about proper nutrition, including hydration,  and aerobic/resistive exercise prescription along with prescribed medications to achieve blood glucose in normal ranges: Fasting glucose 65-99 mg/dL    Expected Outcomes Short Term: Participant verbalizes understanding of the signs/symptoms and immediate care of hyper/hypoglycemia, proper foot care and importance of medication, aerobic/resistive exercise and nutrition plan for blood glucose control.;Long Term: Attainment of HbA1C < 7%.    Heart Failure Yes    Intervention Provide a combined exercise and nutrition program that is supplemented with education, support and counseling about heart failure. Directed toward relieving symptoms such as shortness of breath, decreased exercise tolerance, and extremity edema.    Expected Outcomes Improve functional capacity of life;Short term: Attendance in program 2-3 days a week with increased exercise capacity. Reported lower sodium intake. Reported increased fruit  and vegetable intake. Reports medication compliance.;Short term: Daily weights obtained and reported for increase. Utilizing diuretic protocols set by physician.;Long term: Adoption of self-care skills and reduction of barriers for early signs and symptoms recognition and intervention leading to self-care maintenance.    Hypertension Yes    Intervention Provide education on lifestyle modifcations including regular physical activity/exercise, weight management, moderate sodium restriction and increased consumption of fresh fruit, vegetables, and low fat dairy, alcohol moderation, and smoking cessation.;Monitor prescription use compliance.    Expected Outcomes Short Term: Continued assessment and intervention until BP is < 140/29m HG in hypertensive participants. < 130/813mHG in hypertensive participants with diabetes, heart failure or chronic kidney disease.;Long Term: Maintenance of blood pressure at goal levels.    Lipids Yes    Intervention Provide education and support for participant on nutrition &  aerobic/resistive exercise along with prescribed medications to achieve LDL <7072mHDL >40m57m  Expected Outcomes Long Term: Cholesterol controlled with medications as prescribed, with individualized exercise RX and with personalized nutrition plan. Value goals: LDL < 70mg90mL > 40 mg.;Short Term: Participant states understanding of desired cholesterol values and is compliant with medications prescribed. Participant is following exercise prescription and nutrition guidelines.             Education:Diabetes - Individual verbal and written instruction to review signs/symptoms of diabetes, desired ranges of glucose level fasting, after meals and with exercise. Acknowledge that pre and post exercise glucose checks will be done for 3 sessions at entry of program. [place] [facility]  Date 05/21/22  Educator [facility] Review Code 1- Verbalizes Understanding       Core Components/Risk Factors/Patient Goals Review:   Goals and Risk Factor Review     Row Name 07/05/22 1553 08/01/22 1548           Core Components/Risk Factors/Patient Goals Review   Personal Goals Review Tobacco Cessation Tobacco Cessation      Review DavidOreoluwal be 6 months sober from tobacco in 2 days. He has quit cold [place] continue to be tobacco free. DavidDonnaldbeen tobacco free for over 6 monhs. He does not plan to start smoking again.      Expected Outcomes Short: quit smoking. Long: remain tobacco free. Short: continue to not smoke. Long: stay aways from smoking triggers.               Core Components/Risk Factors/Patient Goals at Discharge (Final Review):   Goals and Risk Factor Review - 08/01/22 1548       Core Components/Risk Factors/Patient Goals Review   Personal Goals Review Tobacco Cessation    Review DavidValerybeen tobacco free for over 6 monhs. He does not plan to start smoking again.    Expected Outcomes Short: continue to  not smoke. Long: stay aways from smoking triggers.             ITP Comments:  ITP Comments     Row Name 05/21/22 1113 05/30/22 1529 05/31/22 1533 06/07/22 1641 06/20/22 0916   ITP Comments Virtual Visit completed. Patient informed on EP and RD appointment and 6 Minute walk test. Patient also informed of patient health questionnaires on My Chart. Patient Verbalizes understanding. Visit diagnosis can be found in [facility]. Completed 6MWT and gym orientation. Initial ITP created and sent for review to Dr. Mark Emily Filbertdical Director. First full day of exercise!  Patient was oriented to  gym and equipment including functions, settings, policies, and procedures.  Patient's individual exercise prescription and treatment plan were reviewed.  All starting workloads were established based on the results of the 6 minute walk test done at initial orientation visit.  The plan for exercise progression was also introduced and progression will be customized based on patient's performance and goals. patient states that he is hurting in his lungs and upper back and all around his upper body. He states it is not a soreness. Listened to patients lung sounds, all his anterior lobes have crackles and ronchi. Posterior is more clear with crackles in the bases. He states that he is taking his lasix but is up 6 pounds since Monday. Informed him to call his doctor in the morning and watch his weight. 30 Day review completed. Medical Director ITP review done, changes made as directed, and signed approval by Medical Director. NEW TO PROGRAM    Row Name 07/18/22 0745 08/15/22 0958         ITP Comments 30 Day review completed. Medical Director ITP review done, changes made as directed, and signed approval by Medical Director. 30 Day review completed. Medical Director ITP review done, changes made as directed, and signed approval by Medical Director.               Comments:

## 2022-08-15 NOTE — Progress Notes (Signed)
Daily Session Note  Patient Details  Name: Lawrence Murray MRN: [id number] Date of Birth: 03/20/1966 Referring Provider:   Flowsheet Row Cardiac Rehab from 05/30/2022 in [facility] Cardiac and Pulmonary Rehab  Referring Provider Ernestina Penna MD       Encounter Date: 08/15/2022  Check In:  Session Check In - 08/15/22 1549       Check-In   Supervising physician immediately available to respond to emergencies See telemetry face sheet for immediately available ER MD    Location [facility]-Cardiac & Pulmonary Rehab    Staff Present Renita Papa, RN Odelia Gage, RN, ADN;Kristen Coble, RN,BC,MSN    Virtual Visit No    Medication changes reported     No    Fall or balance concerns reported    No    Warm-up and Cool-down Performed on first and last piece of equipment    Resistance Training Performed Yes    VAD Patient? No    PAD/SET Patient? No      Pain Assessment   Currently in Pain? No/denies                Social History   Tobacco Use  Smoking Status Former   Packs/day: 1.00   Years: 32.00   Total pack years: 32.00   Types: Cigarettes   Quit date: 02/04/2022   Years since quitting: 0.5  Smokeless Tobacco Never    Goals Met:  Independence with exercise equipment Exercise tolerated well No report of concerns or symptoms today Strength training completed today  Goals Unmet:  Not Applicable  Comments:  Lawrence Murray graduated today from  rehab with 36 sessions completed.  Details of the patient's exercise prescription and what He needs to do in order to continue the prescription and progress were discussed with patient.  Patient was given a copy of prescription and goals.  Patient verbalized understanding.  Lawrence Murray plans to continue to exercise by joining the [employer].    Dr. Emily Filbert is Medical Director for [facility].  Dr. Ottie Glazier is Medical Director for [facility] Pulmonary Rehabilitation.

## 2022-08-15 NOTE — Progress Notes (Signed)
Cardiac Individual Treatment Plan  Patient Details  Name: Lawrence Murray MRN: [id number] Date of Birth: 1966/08/08 Referring Provider:   Flowsheet Row Cardiac Rehab from 05/30/2022 in [facility] Cardiac and Pulmonary Rehab  Referring Provider Ernestina Penna MD       Initial Encounter Date:  Flowsheet Row Cardiac Rehab from 05/30/2022 in [facility] Cardiac and Pulmonary Rehab  Date 05/30/22       Visit Diagnosis: Chronic combined systolic and diastolic heart failure ([place])  Patient's Home Medications on Admission:  Current Outpatient Medications:    Accu-Chek Softclix Lancets lancets, Use as directed to check blood glucose daily, Disp: 100 each, Rfl: 0   acetaminophen (TYLENOL) 500 MG tablet, Take 2 tablets (1,000 mg total) by mouth every 6 (six) hours as needed for mild pain, moderate pain, fever or headache., Disp: 30 tablet, Rfl: 0   ALPRAZolam (XANAX) 1 MG tablet, Take 0.5 tablets (0.5 mg total) by mouth 3 (three) times daily as needed for anxiety., Disp: 90 tablet, Rfl: 0   aspirin EC 81 MG tablet, Take 1 tablet (81 mg total) by mouth daily. Swallow whole., Disp: 90 tablet, Rfl: 2   atorvastatin (LIPITOR) 80 MG tablet, Take 1 tablet (80 mg total) by mouth daily., Disp: 90 tablet, Rfl: 2   Blood Glucose Monitoring Suppl (ACCU-CHEK GUIDE ME) w/Device KIT, Use as directed daily, Disp: 1 kit, Rfl: 0   dapagliflozin propanediol (FARXIGA) 10 MG TABS tablet, Take one tablet by mouth daily, Disp: 90 tablet, Rfl: 3   digoxin (LANOXIN) 0.125 MG tablet, Take 1 tablet (0.125 mg total) by mouth daily., Disp: 90 tablet, Rfl: 2   docusate sodium (STOOL SOFTENER) 250 MG capsule, Take 250 mg by mouth daily., Disp: , Rfl:    furosemide (LASIX) 40 MG tablet, Take 1.5 tablets (60 mg total) by mouth daily., Disp: 135 tablet, Rfl: 3   gabapentin (NEURONTIN) 100 MG capsule, Take 1 capsule (100 mg total) by mouth 3 (three) times daily., Disp: 60 capsule, Rfl: 1   glucose blood test strip, Use as directed once  daily, Disp: 100 each, Rfl: 0   metFORMIN (GLUCOPHAGE) 500 MG tablet, Take 1 tablet (500 mg total) by mouth 2 (two) times daily., Disp: 180 tablet, Rfl: 3   metoprolol succinate (TOPROL-XL) 25 MG 24 hr tablet, Take 0.5 tablets (12.5 mg total) by mouth daily., Disp: 45 tablet, Rfl: 2   pantoprazole (PROTONIX) 40 MG tablet, Take 1 tablet (40 mg total) by mouth daily., Disp: 30 tablet, Rfl: 11   potassium chloride SA (KLOR-CON M) 20 MEQ tablet, Take 1 tablet (20 mEq total) by mouth daily., Disp: 90 tablet, Rfl: 2   spironolactone (ALDACTONE) 25 MG tablet, Take 0.5 tablets (12.5 mg total) by mouth daily., Disp: 45 tablet, Rfl: 2   Zoster Vaccine Adjuvanted (SHINGRIX) injection, Inject into the muscle., Disp: 0.5 mL, Rfl: 1  Past Medical History: Past Medical History:  Diagnosis Date   CHF (congestive heart failure) (HCC)    Coronary artery disease    Diabetes mellitus without complication ([place])    Hypertension     Tobacco Use: Social History   Tobacco Use  Smoking Status Former   Packs/day: 1.00   Years: 32.00   Total pack years: 32.00   Types: Cigarettes   Quit date: 02/04/2022   Years since quitting: 0.5  Smokeless Tobacco Never    Labs: Review Flowsheet       Latest Ref Rng & Units 02/05/2022 02/09/2022 02/24/2022  Labs for ITP Cardiac and Pulmonary Rehab  Cholestrol 0 - 200 mg/dL - - 242   LDL (calc) 0 - 99 mg/dL - - 160   HDL-C >40 mg/dL - - 35   Trlycerides <150 mg/dL - - 235   Hemoglobin A1c 4.8 - 5.6 % 7.7  - -  PH, Arterial 7.35 - 7.45 - 7.52  -  PCO2 arterial 32 - 48 mmHg - 47  -  Bicarbonate 20.0 - 28.0 mmol/L - 38.4  -  O2 Saturation % - 97.6  -     Exercise Target Goals: Exercise Program Goal: Individual exercise prescription set using results from initial 6 min walk test and THRR while considering  patient's activity barriers and safety.   Exercise Prescription Goal: Initial exercise prescription builds to 30-45 minutes a day of aerobic activity, 2-3 days  per week.  Home exercise guidelines will be given to patient during program as part of exercise prescription that the participant will acknowledge.   Education: Aerobic Exercise: - Group verbal and visual presentation on the components of exercise prescription. Introduces F.I.T.T principle from ACSM for exercise prescriptions.  Reviews F.I.T.T. principles of aerobic exercise including progression. Written material given at graduation. Flowsheet Row Cardiac Rehab from 08/01/2022 in [facility] Cardiac and Pulmonary Rehab  Date 06/27/22  Educator [place]  Instruction Review Code 1- Verbalizes Understanding       Education: Resistance Exercise: - Group verbal and visual presentation on the components of exercise prescription. Introduces F.I.T.T principle from ACSM for exercise prescriptions  Reviews F.I.T.T. principles of resistance exercise including progression. Written material given at graduation. Flowsheet Row Cardiac Rehab from 08/01/2022 in [facility] Cardiac and Pulmonary Rehab  Date 07/11/22  Educator KW  Instruction Review Code 1- [employer] Understanding        Education: Exercise & Equipment Safety: - Individual verbal instruction and demonstration of equipment use and safety with use of the equipment. Flowsheet Row Cardiac Rehab from 08/01/2022 in [facility] Cardiac and Pulmonary Rehab  Date 05/21/22  Educator [facility]  Instruction Review Code 1- Verbalizes Understanding       Education: Exercise Physiology & General Exercise Guidelines: - Group verbal and written instruction with models to review the exercise physiology of the cardiovascular system and associated critical values. Provides general exercise guidelines with specific guidelines to those with heart or lung disease.  Flowsheet Row Cardiac Rehab from 08/01/2022 in [facility] Cardiac and Pulmonary Rehab  Date 06/20/22  Educator [place]  Instruction Review Code 1- Verbalizes Understanding       Education: Flexibility, Balance, Mind/Body  Relaxation: - Group verbal and visual presentation with interactive activity on the components of exercise prescription. Introduces F.I.T.T principle from ACSM for exercise prescriptions. Reviews F.I.T.T. principles of flexibility and balance exercise training including progression. Also discusses the mind body connection.  Reviews various relaxation techniques to help reduce and manage stress (i.e. Deep breathing, progressive muscle relaxation, and visualization). Balance handout provided to take home. Written material given at graduation. Flowsheet Row Cardiac Rehab from 08/01/2022 in [facility] Cardiac and Pulmonary Rehab  Date 07/11/22  Educator KW  Instruction Review Code 1- Verbalizes Understanding       Activity Barriers & Risk Stratification:  Activity Barriers & Cardiac Risk Stratification - 05/30/22 1531       Activity Barriers & Cardiac Risk Stratification   Activity Barriers Joint Problems;Deconditioning;Muscular Weakness;Shortness of Breath;Other (comment)    Comments Neuropathy, Edema, PAD    Cardiac Risk Stratification High             6 Minute Walk:  [place] Name 05/30/22 1529 07/30/22 1558       6 Minute Walk   Phase Initial Discharge    Distance 1075 feet 1320 feet    Distance % Change -- 23 %    Distance Feet Change -- 245 ft    Walk Time 6 minutes 6 minutes    # of Rest Breaks 0 0    MPH 2.04 2.5    METS 2.86 3.46    RPE 13 11    Perceived Dyspnea  3 0    VO2 Peak 10 12.11    Symptoms Yes (comment) No    Comments SOB, hip, knee, foot pain/numbness --    Resting HR 70 bpm 90 bpm    Resting BP 100/52 112/72    Resting Oxygen Saturation  99 % 96 %    Exercise Oxygen Saturation  during 6 min walk 100 % 94 %    Max Ex. HR 98 bpm 110 bpm    Max Ex. BP 114/64 130/72    2 Minute Post BP 106/58 --             Oxygen Initial Assessment:   Oxygen Re-Evaluation:   Oxygen Discharge (Final Oxygen Re-Evaluation):   Initial Exercise  Prescription:  Initial Exercise Prescription - 05/30/22 1500       Date of Initial Exercise RX and Referring Provider   Date 05/30/22    Referring Provider Ernestina Penna MD      Oxygen   Maintain Oxygen Saturation 88% or higher      Treadmill   MPH 2    Grade 0    Minutes 15    METs 2.53      REL-XR   Level 1    Speed 50    Minutes 15    METs 2.86      T5 Nustep   Level 1    SPM 80    Minutes 15    METs 2.86      Prescription Details   Frequency (times per week) 3    Duration Progress to 30 minutes of continuous aerobic without signs/symptoms of physical distress      Intensity   THRR 40-80% of Max Heartrate 108-146    Ratings of Perceived Exertion 11-13    Perceived Dyspnea 0-4      Progression   Progression Continue to progress workloads to maintain intensity without signs/symptoms of physical distress.      Resistance Training   Training Prescription Yes    Weight 4 lb    Reps 10-15             Perform Capillary Blood Glucose checks as needed.  Exercise Prescription Changes:   Exercise Prescription Changes     Row Name 05/30/22 1500 06/19/22 1500 07/16/22 1400 07/18/22 1500 08/01/22 1400     Response to Exercise   Blood Pressure (Admit) 100/52 110/64 110/60 -- 124/62   Blood Pressure (Exercise) 114/64 118/72 134/70 -- --   Blood Pressure (Exit) 106/58 108/62 104/64 -- 124/62   Heart Rate (Admit) 70 bpm 74 bpm 87 bpm -- 83 bpm   Heart Rate (Exercise) 98 bpm 137 bpm 128 bpm -- 129 bpm   Heart Rate (Exit) 69 bpm 99 bpm 98 bpm -- 99 bpm   Oxygen Saturation (Admit) 99 % -- -- -- --   Oxygen Saturation (Exercise) 100 % -- -- -- --   Rating of Perceived Exertion (Exercise) 13  15 15 -- 15   Perceived Dyspnea (Exercise) 3 -- -- -- --   Symptoms SOB, Hip, knee, feet pain/tightness -- -- -- --   Comments 6MWT Results 5th full day of exercise -- -- --   Duration -- Continue with 30 min of aerobic exercise without signs/symptoms of physical  distress. Continue with 30 min of aerobic exercise without signs/symptoms of physical distress. -- Continue with 30 min of aerobic exercise without signs/symptoms of physical distress.   Intensity -- THRR unchanged THRR unchanged -- THRR unchanged     Progression   Progression -- Continue to progress workloads to maintain intensity without signs/symptoms of physical distress. Continue to progress workloads to maintain intensity without signs/symptoms of physical distress. -- Continue to progress workloads to maintain intensity without signs/symptoms of physical distress.   Average METs -- 2.81 3.57 -- 4.69     Resistance Training   Training Prescription -- Yes Yes -- Yes   Weight -- 4 lb 4 lb -- 7 lb   Reps -- 10-15 10-15 -- 10-15     Interval Training   Interval Training -- No No -- Yes   Equipment -- -- -- -- REL-XR   Comments -- -- -- -- intervals levels 6-11     Treadmill   MPH -- 2 2.5 -- 2.3   Grade -- 0.5 4 -- 4.5   Minutes -- 15 15 -- 15   METs -- 2.67 4.29 -- 4.19     REL-XR   Level -- 2 11 -- 11   Minutes -- 15 15 -- 15   METs -- 3.5 6.1 -- 7.5     T5 Nustep   Level -- 1 4 -- 5   Minutes -- 15 15 -- 15   METs -- 2.77 2.9 -- 3.3     Home Exercise Plan   Plans to continue exercise at -- -- -- No Name on joining the [place] and using their aerobic machines. [place] on joining the [place] and using their aerobic machines.   Frequency -- -- -- Add 2 additional days to program exercise sessions. Add 2 additional days to program exercise sessions.   Initial Home Exercises Provided -- -- -- 07/18/22 07/18/22     Oxygen   Maintain Oxygen Saturation -- 88% or higher 88% or higher 88% or higher 88% or higher            Exercise Comments:   Exercise Comments     Row Name 05/31/22 1534           Exercise Comments First full day of exercise!  Patient was oriented to gym and equipment including functions, settings,  policies, and procedures.  Patient's individual exercise prescription and treatment plan were reviewed.  All starting workloads were established based on the results of the 6 minute walk test done at initial orientation visit.  The plan for exercise progression was also introduced and progression will be customized based on patient's performance and goals.                Exercise Goals and Review:   Exercise Goals     Row Name 05/30/22 1546             Exercise Goals   Increase Physical Activity Yes       Intervention Develop an individualized exercise prescription for aerobic and resistive training based on initial evaluation findings, risk stratification, comorbidities and participant's personal  goals.;Provide advice, education, support and counseling about physical activity/exercise needs.       Expected Outcomes Short Term: Attend rehab on a regular basis to increase amount of physical activity.;Long Term: Add in home exercise to make exercise part of routine and to increase amount of physical activity.;Long Term: Exercising regularly at least 3-5 days a week.       Increase Strength and Stamina Yes       Intervention Provide advice, education, support and counseling about physical activity/exercise needs.;Develop an individualized exercise prescription for aerobic and resistive training based on initial evaluation findings, risk stratification, comorbidities and participant's personal goals.       Expected Outcomes Short Term: Increase workloads from initial exercise prescription for resistance, speed, and METs.;Short Term: Perform resistance training exercises routinely during rehab and add in resistance training at home;Long Term: Improve cardiorespiratory fitness, muscular endurance and strength as measured by increased METs and functional capacity (6MWT)       Able to understand and use rate of perceived exertion (RPE) scale Yes       Intervention Provide education and explanation  on how to use RPE scale       Expected Outcomes Short Term: Able to use RPE daily in rehab to express subjective intensity level;Long Term:  Able to use RPE to guide intensity level when exercising independently       Able to understand and use Dyspnea scale Yes       Intervention Provide education and explanation on how to use Dyspnea scale       Expected Outcomes Long Term: Able to use Dyspnea scale to guide intensity level when exercising independently;Short Term: Able to use Dyspnea scale daily in rehab to express subjective sense of shortness of breath during exertion       Knowledge and understanding of Target Heart Rate Range (THRR) Yes       Intervention Provide education and explanation of THRR including how the numbers were predicted and where they are located for reference       Expected Outcomes Long Term: Able to use THRR to govern intensity when exercising independently;Short Term: Able to state/look up THRR;Short Term: Able to use daily as guideline for intensity in rehab       Able to check pulse independently Yes       Intervention Review the importance of being able to check your own pulse for safety during independent exercise;Provide education and demonstration on how to check pulse in carotid and radial arteries.       Expected Outcomes Short Term: Able to explain why pulse checking is important during independent exercise;Long Term: Able to check pulse independently and accurately       Understanding of Exercise Prescription Yes       Intervention Provide education, explanation, and written materials on patient's individual exercise prescription       Expected Outcomes Short Term: Able to explain program exercise prescription;Long Term: Able to explain home exercise prescription to exercise independently                Exercise Goals Re-Evaluation :  Exercise Goals Re-Evaluation     Row Name 05/31/22 1534 06/19/22 1521 07/16/22 1421 07/18/22 1559 08/01/22 1450      Exercise Goal Re-Evaluation   Exercise Goals Review Able to understand and use rate of perceived exertion (RPE) scale;Able to understand and use Dyspnea scale;Knowledge and understanding of Target Heart Rate Range (THRR);Understanding of Exercise Prescription Increase Physical Activity;Increase Strength and  Stamina;Understanding of Exercise Prescription Increase Physical Activity;Increase Strength and Stamina;Understanding of Exercise Prescription Increase Physical Activity;Increase Strength and Stamina;Understanding of Exercise Prescription;Able to understand and use Dyspnea scale;Able to check pulse independently;Able to understand and use rate of perceived exertion (RPE) scale;Knowledge and understanding of Target Heart Rate Range (THRR) Increase Physical Activity;Increase Strength and Stamina;Understanding of Exercise Prescription   Comments Reviewed RPE scale, THR and program prescription with pt today.  Pt voiced understanding and was given a copy of goals to take home. Patrik is doing well for the first couple of weeks that he has been here. He has been able to follow his initial exercise prescription well, and even able to add a 0.5% incline to his treadmill workload. He also increased to level 2 on the XR. We will continue to monitor as he progresses in our program. Raliegh continues to do well. Since last review, patient has increased on all his workloads on each exercise machine. He is up to level 11 on the XR, working over 6 METS!! His incline on the treadmill increased to 4%. The T5 Nustep is now up to level 4. He continues to hit his THR each session. We will continue to monitor. Reviewed home exercise with pt today.  Pt plans to join the [place] [facility] and use the aerobic machines for exercise.  Reviewed THR, pulse, RPE, sign and symptoms, pulse oximetery and when to call 911 or MD.  Also discussed weather considerations and indoor options.  Pt voiced understanding. Kolsen continues to do well in  rehab. He recently completed his post 6MWT and improved by 23%! He also increased his overall average MET level to 4.69 METs. He has progressed to level 5 on the T5 as well, and began doing intervals on the XR between levels 6-11. He also began using 7 lb hand weights for resistance training. We will continue to monitor his progress.   Expected Outcomes Short: Use RPE daily to regulate intensity.  Long: Follow program prescription in THR. Short: Continue to increase workload on treadmill Long: Continue to improve overall strength and stamina Short: Increase handweights to 5 lb Long: Continue to increase overall MET level Short: Add 2 days of exercise on days away from rehab. Long: Become independent with exercise. Short: Continue to work towards graduation. Long: Continue to become more independent with exercise.            Discharge Exercise Prescription (Final Exercise Prescription Changes):  Exercise Prescription Changes - 08/01/22 1400       Response to Exercise   Blood Pressure (Admit) 124/62    Blood Pressure (Exit) 124/62    Heart Rate (Admit) 83 bpm    Heart Rate (Exercise) 129 bpm    Heart Rate (Exit) 99 bpm    Rating of Perceived Exertion (Exercise) 15    Duration Continue with 30 min of aerobic exercise without signs/symptoms of physical distress.    Intensity THRR unchanged      Progression   Progression Continue to progress workloads to maintain intensity without signs/symptoms of physical distress.    Average METs 4.69      Resistance Training   Training Prescription Yes    Weight 7 lb    Reps 10-15      Interval Training   Interval Training Yes    Equipment REL-XR    Comments intervals levels 6-11      Treadmill   MPH 2.3    Grade 4.5    Minutes 15  METs 4.19      REL-XR   Level 11    Minutes 15    METs 7.5      T5 Nustep   Level 5    Minutes 15    METs 3.3      Home Exercise Plan   Plans to continue exercise at [place] on  joining the [place] and using their aerobic machines.   Frequency Add 2 additional days to program exercise sessions.    Initial Home Exercises Provided 07/18/22      Oxygen   Maintain Oxygen Saturation 88% or higher             Nutrition:  Target Goals: Understanding of nutrition guidelines, daily intake of sodium <1552m, cholesterol <2051m calories 30% from fat and 7% or less from saturated fats, daily to have 5 or more servings of fruits and vegetables.  Education: All About Nutrition: -Group instruction provided by verbal, written material, interactive activities, discussions, models, and posters to present general guidelines for heart healthy nutrition including fat, fiber, MyPlate, the role of sodium in heart healthy nutrition, utilization of the nutrition label, and utilization of this knowledge for meal planning. Follow up email sent as well. Written material given at graduation. [facility]  Date 07/04/22  [part 1 07/04/22,  part 2 07/18/22]  Educator [facility] Center[facility]ruction Review Code 1- Verbalizes Understanding       Biometrics:  Pre Biometrics - 05/30/22 1547       Pre Biometrics   Height 6' 0.25" (1.835 m)    Weight 254 lb 14.4 oz (115.6 kg)    Waist Circumference 59.5 inches    Hip Circumference 43 inches    Waist to Hip Ratio 1.38 %    BMI (Calculated) 34.34    Single Leg Stand 26.2 seconds   Right            Post Biometrics - 07/30/22 1600        Post  Biometrics   Height 6' 0.25" (1.835 m)    Weight 261 lb 3.2 oz (118.5 kg)    Waist Circumference 51.5 inches    Hip Circumference 42 inches    Waist to Hip Ratio 1.23 %    BMI (Calculated) 35.19    Single Leg Stand 30 seconds             Nutrition Therapy Plan and Nutrition Goals:  Nutrition Therapy & Goals - 05/30/22 1649       Personal Nutrition Goals   Comments [place] feeling overwhelmed with his hospitalization  this summer, quitting smoking, 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 [place] quitting smoking cold [place] hard, but he was not interested in interventions such as the patch due to potential side effects. He reports this was easier in the hospital due to less temptation and now has difficulty on a regular basis; after eating he will want to smoke, but instead will continue eating to curb this. [place] he tried to take care of himself by being an avid onEngineer, manufacturingn various subjects such as nutrition in addition to meeting with an RD at the hospital as well as in the outpatient setting; he feels he knows how to be healthy, it is just a  matter of doing it. This RD briefly reviewed some tactics to save on time and money while trying to eat healthy. Lyfe reports sleeping during the morning hours as his son is at school and his father has an aide to help him so he is able to relax and sleep; he does not end up going to sleep until around 5-7am and will sleep until about 2pm. When he takes his medication he tries to have something in his stomach like a banana. He has his biggest meal in the evening around 7pm where he will have some protein such as pork chops, steak, or chicken with some vegetables (he does not care for frozen and will try to buy many fresh) and potentially a carbohydrate rich food like rice or starchy vegetables; he reports that this meal may be consisdered to be multiple portions by other people - reviewed meal structure such as being mindful of CHO quantity in one sitting. He reports not using fat such as oil or butter with cooking, not eating fried foods, and trying to eat balanced meals using a [place] [employer]. Snacks will vary with choices such as fruit, ice cream, cookies, and vanilla waffers. He reports drinking water throughout the day. Encouraged Cashis  to attend the educational sessions as he is able to and pointed out the nutrition education was in November. Norm reports that his preference is to not receive physical copies of handouts, provided him with youtube channel and sent email with informational handouts on nutrition. Since meeting with this RD he reported not going to outpatient RD, but he still RD information so that he can contact them when needed especially post cardiac rehab.             Nutrition Assessments:  MEDIFICTS Score Key: ?70 Need to make dietary changes  40-70 Heart Healthy Diet ? 40 Therapeutic Level Cholesterol Diet  Flowsheet Row Cardiac Rehab from 08/15/2022 in [facility] Cardiac and Pulmonary Rehab  Picture Your Plate Total Score on Discharge 59      Picture Your Plate Scores: <16 Unhealthy dietary pattern with much room for improvement. 41-50 Dietary pattern unlikely to meet recommendations for good health and room for improvement. 51-60 More healthful dietary pattern, with some room for improvement.  >60 Healthy dietary pattern, although there may be some specific behaviors that could be improved.    Nutrition Goals Re-Evaluation:  Nutrition Goals Re-Evaluation     [place] Name 07/05/22 1555 08/01/22 1553           Goals   Current Weight 261 lb (118.4 kg) 261 lb (118.4 kg)      Nutrition Goal Cut back on portions. Cut back on portions.      Comment Christina eats out anywhere from 2-3 times a week depending when his son is with him. He wants to lose more weight but has appointments and his schedule keeps him from eating regularly. Reign states he is doing better with eating and has improved his eating habits.      Expected Outcome Short: eat smaller potions and lose some weight. Long: adhere to a diet that pertains to him. Short: lose more weight. Long: maintain diet and weightloss independently.               Nutrition Goals Discharge (Final Nutrition Goals Re-Evaluation):  Nutrition Goals  Re-Evaluation - 08/01/22 1553       Goals   Current Weight 261 lb (118.4 kg)    Nutrition Goal Cut back on  portions.    Comment Armani states he is doing better with eating and has improved his eating habits.    Expected Outcome Short: lose more weight. Long: maintain diet and weightloss independently.             Psychosocial: Target Goals: Acknowledge presence or absence of significant depression and/or stress, maximize coping skills, provide positive support system. Participant is able to verbalize types and ability to use techniques and skills needed for reducing stress and depression.   Education: Stress, Anxiety, and Depression - Group verbal and visual presentation to define topics covered.  Reviews how body is impacted by stress, anxiety, and depression.  Also discusses healthy ways to reduce stress and to treat/manage anxiety and depression.  Written material given at graduation.   Education: Sleep Hygiene -Provides group verbal and written instruction about how sleep can affect your health.  Define sleep hygiene, discuss sleep cycles and impact of sleep habits. Review good sleep hygiene tips.    Initial Review & Psychosocial Screening:  Initial Psych Review & Screening - 05/21/22 1119       Initial Review   Current issues with Current Sleep Concerns;Current Psychotropic Meds;History of Depression;Current Depression;Current Anxiety/Panic;Current Stress Concerns    Source of Stress Concerns Chronic Illness;Family    Comments Trevontae is taking care of his dad routnely and lost track of his own health. He was in and out of court for custody and child support in the past. He has not seen a doctor since the [place] and was the last time he had insurance. His dad and son live with him. They are all living off of his fathers social securtiy. He states taking care of his father has drained him financially, phyically and mentaly.      Family Dynamics   Good Support System? No     Strains Intra-family strains;Illness and family care strain      Barriers   Psychosocial barriers to participate in program The patient should benefit from training in stress management and relaxation.      Screening Interventions   Interventions Encouraged to exercise;To provide support and resources with identified psychosocial needs;Provide feedback about the scores to participant    Expected Outcomes Short Term goal: Utilizing psychosocial counselor, staff and physician to assist with identification of specific Stressors or current issues interfering with healing process. Setting desired goal for each stressor or current issue identified.;Long Term Goal: Stressors or current issues are controlled or eliminated.;Short Term goal: Identification and review with participant of any Quality of Life or Depression concerns found by scoring the questionnaire.;Long Term goal: The participant improves quality of Life and PHQ9 Scores as seen by post scores and/or verbalization of changes             Quality of Life Scores:   Quality of Life - 08/15/22 1559       Quality of Life   Select Quality of Life      Quality of Life Scores   Health/Function Pre 11.1 %    Health/Function Post 9.87 %    Health/Function % Change -11.08 %    Socioeconomic Pre 15.5 %    Socioeconomic Post 13.38 %    Socioeconomic % Change  -13.68 %    Psych/Spiritual Pre 7.29 %    Psych/Spiritual Post 10.07 %    Psych/Spiritual % Change 38.13 %    Family Pre 10.2 %    Family Post 10.63 %    Family % Change 4.22 %  GLOBAL Pre 11.21 %    GLOBAL Post 10.82 %    GLOBAL % Change -3.48 %            Scores of 19 and below usually indicate a poorer quality of life in these areas.  A difference of  2-3 points is a clinically meaningful difference.  A difference of 2-3 points in the total score of the Quality of Life Index has been associated with significant improvement in overall quality of life, self-image, physical  symptoms, and general health in studies assessing change in quality of life.  PHQ-9: Review Flowsheet  More data exists      08/15/2022 07/23/2022 06/25/2022 05/30/2022 05/11/2022  Depression screen PHQ 2/9  Decreased Interest _0 0  Down, Depressed, Hopeless _1 PHQ - 2 Score _2 Altered sleeping _3 Tired, decreased energy _4 Change in appetite _5 Feeling bad or failure about yourself  _6 Trouble concentrating _7 Moving slowly or fidgety/restless 1 1 0 2 1  Suicidal thoughts 0 0 0 0 0  PHQ-9 Score _8 Difficult doing work/chores Extremely dIfficult Very difficult Very difficult Very difficult Extremely dIfficult   Interpretation of Total Score  Total Score Depression Severity:  1-4 = Minimal depression, 5-9 = Mild depression, 10-14 = Moderate depression, 15-19 = Moderately severe depression, 20-27 = Severe depression   Psychosocial Evaluation and Intervention:  Psychosocial Evaluation - 05/21/22 1124       Psychosocial Evaluation & Interventions   Interventions Encouraged to exercise with the program and follow exercise prescription;Stress management education;Relaxation education    Comments Jovanni is taking care of his dad routnely and lost track of his own health. He was in and out of court for custody and child support in the past. He has not seen a doctor since the [place] and was the last time he had insurance. His dad and son live with him. They are all living off of his fathers social securtiy. He states taking care of his father has drained him financially, phyically and mentaly.    Expected Outcomes Short: Start HeartTrack to help with mood. Long: Maintain a healthy mental state    Continue Psychosocial Services  Follow up required by staff             Psychosocial Re-Evaluation:  Psychosocial Re-Evaluation     [place] Name 07/05/22 1548 08/01/22 1551           Psychosocial Re-Evaluation    Current issues with Current Depression;History of Depression;Current Anxiety/Panic;Current Psychotropic Meds;Current Sleep Concerns;Current Stress Concerns Current Depression;History of Depression;Current Anxiety/Panic;Current Psychotropic Meds;Current Sleep Concerns;Current Stress Concerns      Comments Kuron is not sleeping well and he feels more anxiety. Alot of his anxiety is just "there". His dads health is not doing well and he is the primary care giver. He has been in and out of court for custody of his son in the past. He has not been able to have a life for himself due to his dad living with him for 11 years. Thoms states that there is no canges since his last review.      Expected Outcomes Short: Attend HeartTrack stress management education to decrease stress. Long: Maintain exercise Post HeartTrack to keep stress at a minimum. Short:  Attend HeartTrack stress management education to decrease stress. Long: Maintain exercise Post HeartTrack to keep stress at a minimum.      Interventions Encouraged to attend Cardiac Rehabilitation for the exercise Encouraged to attend Cardiac Rehabilitation for the exercise      Continue Psychosocial Services  Follow up required by staff Follow up required by staff               Psychosocial Discharge (Final Psychosocial Re-Evaluation):  Psychosocial Re-Evaluation - 08/01/22 1551       Psychosocial Re-Evaluation   Current issues with Current Depression;History of Depression;Current Anxiety/Panic;Current Psychotropic Meds;Current Sleep Concerns;Current Stress Concerns    Comments Kasean states that there is no canges since his last review.    Expected Outcomes Short: Attend HeartTrack stress management education to decrease stress. Long: Maintain exercise Post HeartTrack to keep stress at a minimum.    Interventions Encouraged to attend Cardiac Rehabilitation for the exercise    Continue Psychosocial Services  Follow up required by staff              Vocational Rehabilitation: Provide vocational rehab assistance to qualifying candidates.   Vocational Rehab Evaluation & Intervention:   Education: Education Goals: Education classes will be provided on a variety of topics geared toward better understanding of heart health and risk factor modification. Participant will state understanding/return demonstration of topics presented as noted by education test scores.  Learning Barriers/Preferences:  Learning Barriers/Preferences - 05/21/22 1115       Learning Barriers/Preferences   Learning Barriers None    Learning Preferences None             General Cardiac Education Topics:  AED/CPR: - Group verbal and written instruction with the use of models to demonstrate the basic use of the AED with the basic ABC's of resuscitation.   Anatomy and Cardiac Procedures: - Group verbal and visual presentation and models provide information about basic cardiac anatomy and function. Reviews the testing methods done to diagnose heart disease and the outcomes of the test results. Describes the treatment choices: Medical Management, Angioplasty, or Coronary Bypass Surgery for treating various heart conditions including Myocardial Infarction, Angina, Valve Disease, and Cardiac Arrhythmias.  Written material given at graduation. Flowsheet Row Cardiac Rehab from 08/01/2022 in [facility] Cardiac and Pulmonary Rehab  Education need identified 05/30/22  Date 07/25/22  Educator SB  Instruction Review Code 1- Verbalizes Understanding       Medication Safety: - Group verbal and visual instruction to review commonly prescribed medications for heart and lung disease. Reviews the medication, class of the drug, and side effects. Includes the steps to properly store meds and maintain the prescription regimen.  Written material given at graduation. Flowsheet Row Cardiac Rehab from 08/01/2022 in [facility] Cardiac and Pulmonary Rehab  Date 08/01/22  Educator SB   Instruction Review Code 1- Verbalizes Understanding       Intimacy: - Group verbal instruction through game format to discuss how heart and lung disease can affect sexual intimacy. Written material given at graduation.. Flowsheet Row Cardiac Rehab from 08/01/2022 in [facility] Cardiac and Pulmonary Rehab  Date 06/27/22  Educator [place]  Instruction Review Code 1- Verbalizes Understanding       Know Your Numbers and Heart Failure: - Group verbal and visual instruction to discuss disease risk factors for cardiac and pulmonary disease and treatment options.  Reviews associated critical values for Overweight/Obesity, Hypertension, Cholesterol, and Diabetes.  Discusses basics of heart failure: signs/symptoms and treatments.  Introduces Heart Failure  Zone chart for action plan for heart failure.  Written material given at graduation.   Infection Prevention: - Provides verbal and written material to individual with discussion of infection control including proper hand washing and proper equipment cleaning during exercise session. Flowsheet Row Cardiac Rehab from 08/01/2022 in [facility] Cardiac and Pulmonary Rehab  Date 05/21/22  Educator [facility]  Instruction Review Code 1- Verbalizes Understanding       Falls Prevention: - Provides verbal and written material to individual with discussion of falls prevention and safety. Flowsheet Row Cardiac Rehab from 08/01/2022 in [facility] Cardiac and Pulmonary Rehab  Date 05/21/22  Educator [facility]  Instruction Review Code 1- Verbalizes Understanding       Other: -Provides group and verbal instruction on various topics (see comments)   Knowledge Questionnaire Score:  Knowledge Questionnaire Score - 08/15/22 1557       Knowledge Questionnaire Score   Post Score 26/26             Core Components/Risk Factors/Patient Goals at Admission:  Personal Goals and Risk Factors at Admission - 05/30/22 1558       Core Components/Risk Factors/Patient Goals on  Admission    Weight Management Yes;Weight Loss    Intervention Weight Management: Develop a combined nutrition and exercise program designed to reach desired caloric intake, while maintaining appropriate intake of nutrient and fiber, sodium and fats, and appropriate energy expenditure required for the weight goal.;Weight Management: Provide education and appropriate resources to help participant work on and attain dietary goals.;Weight Management/Obesity: Establish reasonable short term and long term weight goals.    Admit Weight 254 lb 14.4 oz (115.6 kg)    Goal Weight: Short Term 250 lb (113.4 kg)    Goal Weight: Long Term 240 lb (108.9 kg)    Expected Outcomes Short Term: Continue to assess and modify interventions until short term weight is achieved;Long Term: Adherence to nutrition and physical activity/exercise program aimed toward attainment of established weight goal;Weight Loss: Understanding of general recommendations for a balanced deficit meal plan, which promotes 1-2 lb weight loss per week and includes a negative energy balance of [phone] kcal/d;Understanding recommendations for meals to include 15-35% energy as protein, 25-35% energy from fat, 35-60% energy from carbohydrates, less than 247m of dietary cholesterol, 20-35 gm of total fiber daily;Understanding of distribution of calorie intake throughout the day with the consumption of 4-5 meals/snacks    Tobacco Cessation Yes    Number of packs per day 0    Intervention Assist the participant in steps to quit. Provide individualized education and counseling about committing to Tobacco Cessation, relapse prevention, and pharmacological support that can be provided by physician.;OAdvice worker assist with locating and accessing local/national Quit Smoking programs, and support quit date choice.    Expected Outcomes Short Term: Will demonstrate readiness to quit, by selecting a quit date.;Short Term: Will quit all tobacco  product use, adhering to prevention of relapse plan.;Long Term: Complete abstinence from all tobacco products for at least 12 months from quit date.    Diabetes Yes    Intervention Provide education about signs/symptoms and action to take for hypo/hyperglycemia.;Provide education about proper nutrition, including hydration, and aerobic/resistive exercise prescription along with prescribed medications to achieve blood glucose in normal ranges: Fasting glucose 65-99 mg/dL    Expected Outcomes Short Term: Participant verbalizes understanding of the signs/symptoms and immediate care of hyper/hypoglycemia, proper foot care and importance of medication, aerobic/resistive exercise and nutrition plan for blood glucose control.;Long Term: Attainment of HbA1C <  7%.    Heart Failure Yes    Intervention Provide a combined exercise and nutrition program that is supplemented with education, support and counseling about heart failure. Directed toward relieving symptoms such as shortness of breath, decreased exercise tolerance, and extremity edema.    Expected Outcomes Improve functional capacity of life;Short term: Attendance in program 2-3 days a week with increased exercise capacity. Reported lower sodium intake. Reported increased fruit and vegetable intake. Reports medication compliance.;Short term: Daily weights obtained and reported for increase. Utilizing diuretic protocols set by physician.;Long term: Adoption of self-care skills and reduction of barriers for early signs and symptoms recognition and intervention leading to self-care maintenance.    Hypertension Yes    Intervention Provide education on lifestyle modifcations including regular physical activity/exercise, weight management, moderate sodium restriction and increased consumption of fresh fruit, vegetables, and low fat dairy, alcohol moderation, and smoking cessation.;Monitor prescription use compliance.    Expected Outcomes Short Term: Continued  assessment and intervention until BP is < 140/71m HG in hypertensive participants. < 130/847mHG in hypertensive participants with diabetes, heart failure or chronic kidney disease.;Long Term: Maintenance of blood pressure at goal levels.    Lipids Yes    Intervention Provide education and support for participant on nutrition & aerobic/resistive exercise along with prescribed medications to achieve LDL <7070mHDL >70m23m  Expected Outcomes Long Term: Cholesterol controlled with medications as prescribed, with individualized exercise RX and with personalized nutrition plan. Value goals: LDL < 70mg61mL > 40 mg.;Short Term: Participant states understanding of desired cholesterol values and is compliant with medications prescribed. Participant is following exercise prescription and nutrition guidelines.             Education:Diabetes - Individual verbal and written instruction to review signs/symptoms of diabetes, desired ranges of glucose level fasting, after meals and with exercise. Acknowledge that pre and post exercise glucose checks will be done for 3 sessions at entry of program. [place] [facility]  Date 05/21/22  Educator J[facility]  [facility] Hospitaltruction Review Code 1- Verbalizes Understanding       Core Components/Risk Factors/Patient Goals Review:   Goals and Risk Factor Review     Row Name 07/05/22 1553 08/01/22 1548           Core Components/Risk Factors/Patient Goals Review   Personal Goals Review Tobacco Cessation Tobacco Cessation      Review DavidMaddonl be 6 months sober from tobacco in 2 days. He has quit cold [place] continue to be tobacco free. DavidBurlbeen tobacco free for over 6 monhs. He does not plan to start smoking again.      Expected Outcomes Short: quit smoking. Long: remain tobacco free. Short: continue to not smoke. Long: stay aways from smoking triggers.               Core Components/Risk  Factors/Patient Goals at Discharge (Final Review):   Goals and Risk Factor Review - 08/01/22 1548       Core Components/Risk Factors/Patient Goals Review   Personal Goals Review Tobacco Cessation    Review DavidHeatonbeen tobacco free for over 6 monhs. He does not plan to start smoking again.    Expected Outcomes Short: continue to not smoke. Long: stay aways from smoking triggers.             ITP Comments:  ITP Comments     Row Name 05/21/22 1113 05/30/22 1529 05/31/22  1533 06/07/22 1641 06/20/22 0916   ITP Comments Virtual Visit completed. Patient informed on EP and RD appointment and 6 Minute walk test. Patient also informed of patient health questionnaires on My Chart. Patient Verbalizes understanding. Visit diagnosis can be found in [facility] 04/11/2022. Completed 6MWT and gym orientation. Initial ITP created and sent for review to Dr. Emily Filbert, {Medical Director. First full day of exercise!  Patient was oriented to gym and equipment including functions, settings, policies, and procedures.  Patient's individual exercise prescription and treatment plan were reviewed.  All starting workloads were established based on the results of the 6 minute walk test done at initial orientation visit.  The plan for exercise progression was also introduced and progression will be customized based on patient's performance and goals. patient states that he is hurting in his lungs and upper back and all around his upper body. He states it is not a soreness. Listened to patients lung sounds, all his anterior lobes have crackles and ronchi. Posterior is more clear with crackles in the bases. He states that he is taking his lasix but is up 6 pounds since Monday. Informed him to call his doctor in the morning and watch his weight. 30 Day review completed. Medical Director ITP review done, changes made as directed, and signed approval by Medical Director. NEW TO PROGRAM    Row Name 07/18/22 0745 08/15/22 0958 08/15/22  1550       ITP Comments 30 Day review completed. Medical Director ITP review done, changes made as directed, and signed approval by Medical Director. 30 Day review completed. Medical Director ITP review done, changes made as directed, and signed approval by Medical Director. Graciano graduated today from  rehab with 36 sessions completed.  Details of the patient's exercise prescription and what He needs to do in order to continue the prescription and progress were discussed with patient.  Patient was given a copy of prescription and goals.  Patient verbalized understanding.  Erik plans to continue to exercise by joining the [employer].              Comments: discharge ITP

## 2022-08-17 ENCOUNTER — Other Ambulatory Visit: Payer: Self-pay | Admitting: Nurse Practitioner

## 2022-08-17 ENCOUNTER — Other Ambulatory Visit: Payer: Self-pay

## 2022-08-21 ENCOUNTER — Other Ambulatory Visit: Payer: Self-pay

## 2022-08-21 ENCOUNTER — Ambulatory Visit (INDEPENDENT_AMBULATORY_CARE_PROVIDER_SITE_OTHER): Payer: Medicaid Other | Admitting: Vascular Surgery

## 2022-08-21 ENCOUNTER — Encounter (INDEPENDENT_AMBULATORY_CARE_PROVIDER_SITE_OTHER): Payer: Self-pay | Admitting: Vascular Surgery

## 2022-08-21 ENCOUNTER — Other Ambulatory Visit: Payer: Medicaid Other | Admitting: *Deleted

## 2022-08-21 VITALS — BP 124/87 | HR 94 | Resp 18 | Ht 73.0 in | Wt 263.0 lb

## 2022-08-21 DIAGNOSIS — I739 Peripheral vascular disease, unspecified: Secondary | ICD-10-CM

## 2022-08-21 DIAGNOSIS — E1165 Type 2 diabetes mellitus with hyperglycemia: Secondary | ICD-10-CM | POA: Diagnosis not present

## 2022-08-21 DIAGNOSIS — I10 Essential (primary) hypertension: Secondary | ICD-10-CM | POA: Diagnosis not present

## 2022-08-21 DIAGNOSIS — R600 Localized edema: Secondary | ICD-10-CM

## 2022-08-21 DIAGNOSIS — G6289 Other specified polyneuropathies: Secondary | ICD-10-CM | POA: Diagnosis not present

## 2022-08-21 MED FILL — Gabapentin Cap 100 MG: ORAL | 20 days supply | Qty: 60 | Fill #0 | Status: AC

## 2022-08-21 NOTE — Progress Notes (Signed)
MRN : [id number]  Lawrence Murray is a 57 y.o. (1966-03-28) male who presents with chief complaint of  Chief Complaint  Patient presents with   Follow-up    ultrasound  .  History of Present Illness: Patient returns today in follow up of his leg swelling and neuropathy after his noninvasive studies last week.  He continues to be bothered by marked neuropathic symptoms to both lower extremities.  His swelling seems to be a lot better. His ABIs were 1.20 on the right and 1.15 on the left with multiphasic waveforms and normal digital pressures consistent with significant arterial insufficiency.  His venous ultrasound showed no deep venous thrombosis, superficial thrombophlebitis, or significant venous reflux in either lower extremity.  Current Outpatient Medications  Medication Sig Dispense Refill   Accu-Chek Softclix Lancets lancets Use as directed to check blood glucose daily 100 each 0   acetaminophen (TYLENOL) 500 MG tablet Take 2 tablets (1,000 mg total) by mouth every 6 (six) hours as needed for mild pain, moderate pain, fever or headache. 30 tablet 0   ALPRAZolam (XANAX) 1 MG tablet Take 0.5 tablets (0.5 mg total) by mouth 3 (three) times daily as needed for anxiety. 90 tablet 0   aspirin EC 81 MG tablet Take 1 tablet (81 mg total) by mouth daily. Swallow whole. 90 tablet 2   atorvastatin (LIPITOR) 80 MG tablet Take 1 tablet (80 mg total) by mouth daily. 90 tablet 2   Blood Glucose Monitoring Suppl (ACCU-CHEK GUIDE ME) w/Device KIT Use as directed daily 1 kit 0   dapagliflozin propanediol (FARXIGA) 10 MG TABS tablet Take one tablet by mouth daily 90 tablet 3   digoxin (LANOXIN) 0.125 MG tablet Take 1 tablet (0.125 mg total) by mouth daily. 90 tablet 2   docusate sodium (STOOL SOFTENER) 250 MG capsule Take 250 mg by mouth daily.     furosemide (LASIX) 40 MG tablet Take 1.5 tablets (60 mg total) by mouth daily. 135 tablet 3   gabapentin (NEURONTIN) 100 MG capsule Take 1 capsule (100 mg  total) by mouth 3 (three) times daily. 60 capsule 1   glucose blood test strip Use as directed once daily 100 each 0   metFORMIN (GLUCOPHAGE) 500 MG tablet Take 1 tablet (500 mg total) by mouth 2 (two) times daily. 180 tablet 3   metoprolol succinate (TOPROL-XL) 25 MG 24 hr tablet Take 0.5 tablets (12.5 mg total) by mouth daily. 45 tablet 2   pantoprazole (PROTONIX) 40 MG tablet Take 1 tablet (40 mg total) by mouth daily. 30 tablet 11   potassium chloride SA (KLOR-CON M) 20 MEQ tablet Take 1 tablet (20 mEq total) by mouth daily. 90 tablet 2   spironolactone (ALDACTONE) 25 MG tablet Take 0.5 tablets (12.5 mg total) by mouth daily. 45 tablet 2   Zoster Vaccine Adjuvanted [facility] injection Inject into the muscle. 0.5 mL 1   No current facility-administered medications for this visit.    Past Medical History:  Diagnosis Date   CHF (congestive heart failure) (HCC)    Coronary artery disease    Diabetes mellitus without complication ([place])    Hypertension     Past Surgical History:  Procedure Laterality Date   COLONOSCOPY WITH PROPOFOL N/A 06/14/2022   Procedure: COLONOSCOPY WITH PROPOFOL;  Surgeon: [name], MD;  Location: [facility] ENDOSCOPY;  Service: Gastroenterology;  Laterality: N/A;   LOWER EXTREMITY ANGIOGRAPHY Left 02/14/2022   Procedure: Lower Extremity Angiography;  Surgeon: Algernon Huxley, MD;  Location: [facility]  CV LAB;  Service: Cardiovascular;  Laterality: Left;   RIGHT/LEFT HEART CATH AND CORONARY ANGIOGRAPHY N/A 02/21/2022   Procedure: RIGHT/LEFT HEART CATH AND CORONARY ANGIOGRAPHY;  Surgeon: Nelva Bush, MD;  Location: [place] [facility];  Service: Cardiovascular;  Laterality: N/A;     Social History   Tobacco Use   Smoking status: Former    Packs/day: 1.00    Years: 32.00    Total pack years: 32.00    Types: Cigarettes    Quit date: 02/04/2022    Years since quitting: 0.5   Smokeless tobacco: Never  Substance Use Topics   Alcohol use: Not Currently    Drug use: Never      Family History  Problem Relation Age of Onset   Congestive Heart Failure Father      No Known Allergies  REVIEW OF SYSTEMS (Negative unless checked)   Constitutional: _0 Weight loss  _1 Fever  _2 Chills Cardiac: _3 Chest pain   _4 Chest pressure   _5 Palpitations   _6 Shortness of breath when laying flat   _7 Shortness of breath at rest   _8 Shortness of breath with exertion. Vascular:  _9 Pain in legs with walking   _10 Pain in legs at rest   _11 Pain in legs when laying flat   _12 Claudication   _13 Pain in feet when walking  _14 Pain in feet at rest  _15 Pain in feet when laying flat   _16 History of DVT   _17 Phlebitis   _18 Swelling in legs   _19 Varicose veins   _20 Non-healing ulcers Pulmonary:   _21 Uses home oxygen   _22 Productive cough   _23 Hemoptysis   _24 Wheeze  _25 COPD   _26 Asthma Neurologic:  _27 Dizziness  _28 Blackouts   _29 Seizures   _30 History of stroke   _31 History of TIA  _32 Aphasia   _33 Temporary blindness   _34 Dysphagia   _35 Weakness or numbness in arms   _36 Weakness or numbness in legs Musculoskeletal:  _37 Arthritis   _38 Joint swelling   _39 Joint pain   _40 Low back pain Hematologic:  _41 Easy bruising  _42 Easy bleeding   _43 Hypercoagulable state   _44 Anemic  _45 Hepatitis Gastrointestinal:  _46 Blood in stool   _47 Vomiting blood  _48 Gastroesophageal reflux/heartburn   _49 Abdominal pain Genitourinary:  _50 Chronic kidney disease   _51 Difficult urination  _52 Frequent urination  _53 Burning with urination   _54 Hematuria Skin:  _55 Rashes   _56 Ulcers   _57 Wounds Psychological:  _58 History of anxiety   _59  History of major depression.  Physical Examination  BP 124/87   Pulse 94   Resp 18   Ht _60  (1.854 m)   Wt 263 lb (119.3 kg)   BMI 34.70 kg/m  Gen:  WD/WN, NAD Head: [place]/AT, No temporalis wasting. Ear/Nose/Throat: Hearing grossly intact, nares w/o erythema or drainage Eyes: Conjunctiva clear. Sclera non-icteric Neck: Supple.  Trachea midline Pulmonary:  Good air movement, no use of accessory muscles.   Cardiac: RRR, no JVD Vascular:  Vessel Right Left  Radial Palpable Palpable                          PT Palpable Palpable  DP Palpable Palpable   Gastrointestinal: soft, non-tender/non-distended. No guarding/reflex.  Musculoskeletal: M/S 5/5 throughout.  No deformity or atrophy.  No appreciable lower extremity edema. Neurologic: Sensation grossly intact in extremities.  Symmetrical.  Speech is fluent.  Psychiatric: Judgment intact, Mood & affect appropriate for pt's clinical situation. Dermatologic: No rashes or ulcers noted.  No cellulitis or open wounds.      Labs Recent Results (from the past 2160 hour(s))  Glucose, capillary  Status: Abnormal   Collection Time: 05/31/22  3:29 PM  Result Value Ref Range   Glucose-Capillary 170 (H) 70 - 99 mg/dL    Comment: Glucose reference range applies only to samples taken after fasting for at least 8 hours.  Glucose, capillary     Status: Abnormal   Collection Time: 05/31/22  4:33 PM  Result Value Ref Range   Glucose-Capillary 153 (H) 70 - 99 mg/dL    Comment: Glucose reference range applies only to samples taken after fasting for at least 8 hours.  Microalbumin, urine     Status: None   Collection Time: 06/01/22  3:36 PM  Result Value Ref Range   Microalb, Ur <0.2 mg/dL    Comment: Reference Range Not established    RAM      Comment: . The ADA defines abnormalities in albumin excretion as follows: Marland Kitchen Albuminuria Category         Result (mcg/mg creatinine) . Normal to Mildly increased    <30 Moderately increased          30-299  Severely increased            > OR = 300 . The ADA recommends that at least two of three specimens collected within a 3-6 month period be abnormal before considering a patient to be within a diagnostic category.   Glucose, capillary     Status: Abnormal   Collection Time: 06/04/22  3:36 PM  Result Value Ref Range   Glucose-Capillary 178 (H) 70 - 99 mg/dL    Comment: Glucose reference  range applies only to samples taken after fasting for at least 8 hours.  Glucose, capillary     Status: Abnormal   Collection Time: 06/04/22  4:34 PM  Result Value Ref Range   Glucose-Capillary 199 (H) 70 - 99 mg/dL    Comment: Glucose reference range applies only to samples taken after fasting for at least 8 hours.  Glucose, capillary     Status: Abnormal   Collection Time: 06/06/22  3:35 PM  Result Value Ref Range   Glucose-Capillary 166 (H) 70 - 99 mg/dL    Comment: Glucose reference range applies only to samples taken after fasting for at least 8 hours.  Glucose, capillary     Status: Abnormal   Collection Time: 06/06/22  4:34 PM  Result Value Ref Range   Glucose-Capillary 158 (H) 70 - 99 mg/dL    Comment: Glucose reference range applies only to samples taken after fasting for at least 8 hours.  Basic metabolic panel     Status: Abnormal   Collection Time: 06/08/22  3:26 PM  Result Value Ref Range   Sodium 136 135 - 145 mmol/L   Potassium 4.7 3.5 - 5.1 mmol/L   Chloride 100 98 - 111 mmol/L   CO2 27 22 - 32 mmol/L   Glucose, Bld 192 (H) 70 - 99 mg/dL    Comment: Glucose reference range applies only to samples taken after fasting for at least 8 hours.   BUN 19 6 - 20 mg/dL   Creatinine, Ser 1.10 0.61 - 1.24 mg/dL   Calcium 9.7 8.9 - 10.3 mg/dL   GFR, Estimated >60 >60 mL/min    Comment: (NOTE) Calculated using the CKD-EPI Creatinine Equation (2021)    Anion gap 9 5 - 15    Comment: Performed at [facility], [address] [place], [place] [address]  Glucose, capillary     Status: Abnormal   Collection Time: 06/14/22  9:00 AM  Result Value Ref Range   Glucose-Capillary 168 (H) 70 - 99 mg/dL    Comment: Glucose reference range applies only to samples taken after fasting for at least 8 hours.   Comment 1 IN EPIC   Surgical pathology     Status: None   Collection Time: 06/14/22  9:46 AM  Result Value Ref Range   SURGICAL PATHOLOGY      SURGICAL PATHOLOGY CASE:  [id number] PATIENT: [place] Surgical Pathology Report     Specimen Submitted: A. Colon polyp x2, sigmoid; cold snare B. Rectum polyp; cold snare  Clinical History: Screening colonoscopy.  Colon polyps      DIAGNOSIS: A.  COLON POLYP X 2, SIGMOID; COLD SNARE: - TUBULAR ADENOMA. - HYPERPLASTIC POLYP. - NEGATIVE FOR HIGH-GRADE DYSPLASIA AND MALIGNANCY.  B.  RECTUM POLYP; COLD SNARE: - HYPERPLASTIC POLYP. - NEGATIVE FOR DYSPLASIA AND MALIGNANCY.  GROSS DESCRIPTION: A. Labeled: Sigmoid colon polyp cold snare x 2 Received: Formalin Collection time: 9:46 AM on 06/14/2022 Placed into formalin time: 9:46 AM on 06/14/2022 Tissue fragment(s): Multiple Size: Aggregate, 1.8 x 0.5 x 0.1 cm Description: Received are tan soft tissue fragments, admixed with intestinal debris.  The ratio of soft tissue to intestinal debris is 50: 50. Entirely submitted in 1 cassette.  B. Labeled: Rectum polyp cold snare Received: Formalin Collect ion time: 10:06 AM on 06/14/2022 Placed into formalin time: 10:06 AM on 06/14/2022 Tissue fragment(s): Multiple Size: Aggregate, 0.5 x 0.2 x 0.1 cm Description: Received is a tan soft tissue fragment, admixed with intestinal debris.  The ratio of soft tissue to intestinal debris is 90: 10. Entirely submitted in 1 cassette.  CM 06/14/2022  Final Diagnosis performed by Quay Burow, MD.   Electronically signed 06/15/2022 8:28:33AM The electronic signature indicates that the named Attending Pathologist has evaluated the specimen Technical component performed at [facility], [address] [place], [place] [address] Lab: [phone] Dir: Rush Farmer, MD, MMM  Professional component performed at [facility], [place], [place], [place] [address] Lab: [phone] Dir: Kathi Simpers, MD   ECHOCARDIOGRAM COMPLETE     Status: None   Collection Time: 07/19/22  2:58 PM  Result Value Ref Range   S' Lateral 4.70 cm    Area-P 1/2 5.38 cm2   Single Plane A4C EF 36.8 %   Single Plane A2C EF 28.9 %   Calc EF 32.2 %  HM DIABETES EYE EXAM     Status: None   Collection Time: 07/24/22 12:00 AM  Result Value Ref Range   HM Diabetic Eye Exam No Retinopathy No Retinopathy  Glucose, capillary     Status: Abnormal   Collection Time: 07/25/22  4:36 PM  Result Value Ref Range   Glucose-Capillary 167 (H) 70 - 99 mg/dL    Comment: Glucose reference range applies only to samples taken after fasting for at least 8 hours.    Radiology VAS [place] LOWER EXTREMITY VENOUS REFLUX  Result Date: 08/16/2022  Lower Venous Reflux Study Patient Name:  BOGDAN VIVONA  Date of Exam:   08/15/2022 Medical Rec #: [id number]     Accession #:    [id number] Date of Birth: 1965/10/16    Patient Gender: M Patient Age:   48 years Exam Location:  [facility] Procedure:      VAS [place] LOWER EXTREMITY VENOUS REFLUX Referring Phys: Corene Cornea Rateel Beldin --------------------------------------------------------------------------------  Indications: Edema, and ankles and feet, improved at this time.  Performing Technologist: Concha Norway RVT  Examination Guidelines: A complete evaluation includes B-mode imaging, spectral Doppler, color Doppler, and power Doppler as needed of all accessible portions of each vessel. Bilateral testing is considered an integral part of a complete examination. Limited examinations for reoccurring indications may be performed as noted. The reflux portion of the exam is performed with the patient in reverse Trendelenburg. Significant venous reflux is defined as >500 ms in the superficial venous system, and >1 second in the deep venous system.   Summary: Bilateral: - No evidence of deep vein thrombosis seen in the lower extremities, bilaterally, from the common femoral through the popliteal veins. - No evidence of superficial venous thrombosis in the lower extremities, bilaterally. - No evidence of deep venous insufficiency seen  bilaterally in the lower extremity. - No evidence of superficial venous reflux seen in the greater saphenous veins bilaterally. - No evidence of superficial venous reflux seen in the short saphenous veins bilaterally.  *See table(s) above for measurements and observations. Electronically signed by Leotis Pain MD on 08/16/2022 at 9:58:14 AM.    Final    VAS [place] ABI WITH/WO TBI  Result Date: 08/16/2022  LOWER EXTREMITY DOPPLER STUDY Patient Name:  Georg Ang  Date of Exam:   08/15/2022 Medical Rec #: [id number]     Accession #:    [id number] Date of Birth: 1965/11/15    Patient Gender: M Patient Age:   89 years Exam Location:  [facility] Procedure:      VAS [place] ABI WITH/WO TBI Referring Phys: --------------------------------------------------------------------------------  Indications: Rest pain.  Performing Technologist: Concha Norway RVT  Examination Guidelines: A complete evaluation includes at minimum, Doppler waveform signals and systolic blood pressure reading at the level of bilateral brachial, anterior tibial, and posterior tibial arteries, when vessel segments are accessible. Bilateral testing is considered an integral part of a complete examination. Photoelectric Plethysmograph (PPG) waveforms and toe systolic pressure readings are included as required and additional duplex testing as needed. Limited examinations for reoccurring indications may be performed as noted.  ABI Findings: +---------+------------------+-----+---------+--------+ Right    Rt Pressure (mmHg)IndexWaveform Comment  +---------+------------------+-----+---------+--------+ Brachial 120                                      +---------+------------------+-----+---------+--------+ ATA      130               1.08 triphasic         +---------+------------------+-----+---------+--------+ PTA      144               1.20 triphasic         +---------+------------------+-----+---------+--------+ Great Toe114                0.95 Normal            +---------+------------------+-----+---------+--------+ +---------+------------------+-----+---------+-------+ Left     Lt Pressure (mmHg)IndexWaveform Comment +---------+------------------+-----+---------+-------+ Brachial 120                                     +---------+------------------+-----+---------+-------+ ATA      129               1.08 triphasic        +---------+------------------+-----+---------+-------+ PTA      138               1.15 triphasic        +---------+------------------+-----+---------+-------+  Great Toe109               0.91 Normal           +---------+------------------+-----+---------+-------+  Summary: Right: Resting right ankle-brachial index is within normal range. The right toe-brachial index is normal. Left: Resting left ankle-brachial index is within normal range. The left toe-brachial index is normal. *See table(s) above for measurements and observations.  Electronically signed by Leotis Pain MD on 08/16/2022 at 9:58:09 AM.    Final    CT CHEST LUNG CA SCREEN LOW DOSE W/O CM  Result Date: 08/02/2022 CLINICAL DATA:  57 year old asymptomatic male former smoker with 32 pack-year smoking history, quit smoking 02/04/2022. EXAM: CT CHEST WITHOUT CONTRAST LOW-DOSE FOR LUNG CANCER SCREENING TECHNIQUE: Multidetector CT imaging of the chest was performed following the standard protocol without IV contrast. RADIATION DOSE REDUCTION: This exam was performed according to the departmental dose-optimization program which includes automated exposure control, adjustment of the mA and/or kV according to patient size and/or use of iterative reconstruction technique. COMPARISON:  None Available. FINDINGS: Cardiovascular: Normal heart size. No significant pericardial effusion/thickening. Three-vessel coronary atherosclerosis. Mildly atherosclerotic nonaneurysmal thoracic aorta. Normal caliber pulmonary arteries. Mediastinum/Nodes:  No significant thyroid nodules. Unremarkable esophagus. No pathologically enlarged axillary, mediastinal or hilar lymph nodes, noting limited sensitivity for the detection of hilar adenopathy on this noncontrast study. Lungs/Pleura: No pneumothorax. No pleural effusion. Mild paraseptal and centrilobular emphysema with diffuse bronchial wall thickening. No acute consolidative airspace disease or lung masses. No significant pulmonary nodules. Tiny calcified granuloma in the anterior medial basilar right upper lobe. Upper abdomen: No acute abnormality. Musculoskeletal: No aggressive appearing focal osseous lesions. Mild thoracic spondylosis. IMPRESSION: 1. Lung-RADS 1, negative. Continue annual screening with low-dose chest CT without contrast in 12 months. 2. Three-vessel coronary atherosclerosis. 3. Aortic Atherosclerosis (ICD10-I70.0) and Emphysema (ICD10-J43.9). Electronically Signed   By: Ilona Sorrel M.D.   On: 08/02/2022 14:19   Assessment/Plan  PVD (peripheral vascular disease) ([place]) His ABIs were 1.20 on the right and 1.15 on the left with multiphasic waveforms and normal digital pressures consistent with significant arterial insufficiency.  His venous ultrasound showed no deep venous thrombosis, superficial thrombophlebitis, or significant venous reflux in either lower extremity.  Primary hypertension blood pressure control important in reducing the progression of atherosclerotic disease. On appropriate oral medications.   Bilateral edema of lower extremity His ABIs were 1.20 on the right and 1.15 on the left with multiphasic waveforms and normal digital pressures consistent with significant arterial insufficiency.  His venous ultrasound showed no deep venous thrombosis, superficial thrombophlebitis, or significant venous reflux in either lower extremity. The swelling is markedly improved.  This was likely nearly entirely related to his congestive heart failure.  Compression socks, elevation, and  activity are important for keeping this in check going forward.  Peripheral neuropathy Likely from his poorly controlled diabetes.  Defer management to his primary care provider.  Type 2 diabetes mellitus with hyperglycemia (HCC) blood glucose control important in reducing the progression of atherosclerotic disease. Also, involved in wound healing. On appropriate medications.    Leotis Pain, MD  08/21/2022 4:52 PM    This note was created with Dragon medical transcription system.  Any errors from dictation are purely unintentional

## 2022-08-21 NOTE — Patient Instructions (Signed)
Thank you for taking time to speak with me today about care coordination and care management services available to you at no cost as part of your Medicaid benefit. These services are voluntary. Our team is available to provide assistance regarding your health care needs at any time. Please do not hesitate to reach out to me if we can be of service to you at any time in the future.   Lurena Joiner RN, BSN [place]  [employer] manager

## 2022-08-21 NOTE — Assessment & Plan Note (Signed)
blood glucose control important in reducing the progression of atherosclerotic disease. Also, involved in wound healing. On appropriate medications.  

## 2022-08-21 NOTE — Assessment & Plan Note (Signed)
>>  ASSESSMENT AND PLAN FOR TYPE 2 DIABETES MELLITUS WITH HYPERGLYCEMIA (HCC) WRITTEN ON 08/21/2022  4:52 PM BY DEW, JASON S, MD  blood glucose control important in reducing the progression of atherosclerotic disease. Also, involved in wound healing. On appropriate medications.

## 2022-08-21 NOTE — Assessment & Plan Note (Signed)
Likely from his poorly controlled diabetes.  Defer management to his primary care provider.

## 2022-08-21 NOTE — Patient Outreach (Signed)
Care Coordination  08/21/2022  [name] 1965/11/26 [id number]   Lawrence Murray was referred to the [facility] Managed Care High Risk team for assistance with care coordination and care management services. Care coordination/care management services as part of the Medicaid benefit was offered to the patient today. The patient declined assistance offered today.   Plan: The Medicaid Managed Care High Risk team is available at any time in the future to assist with care coordination/care management services upon referral.   Lurena Joiner RN, BSN [place] RN Care Coordinator

## 2022-08-21 NOTE — Assessment & Plan Note (Signed)
His ABIs were 1.20 on the right and 1.15 on the left with multiphasic waveforms and normal digital pressures consistent with significant arterial insufficiency.  His venous ultrasound showed no deep venous thrombosis, superficial thrombophlebitis, or significant venous reflux in either lower extremity.

## 2022-08-21 NOTE — Assessment & Plan Note (Signed)
blood pressure control important in reducing the progression of atherosclerotic disease. On appropriate oral medications.  

## 2022-08-21 NOTE — Assessment & Plan Note (Signed)
His ABIs were 1.20 on the right and 1.15 on the left with multiphasic waveforms and normal digital pressures consistent with significant arterial insufficiency.  His venous ultrasound showed no deep venous thrombosis, superficial thrombophlebitis, or significant venous reflux in either lower extremity. The swelling is markedly improved.  This was likely nearly entirely related to his congestive heart failure.  Compression socks, elevation, and activity are important for keeping this in check going forward.

## 2022-08-24 ENCOUNTER — Other Ambulatory Visit: Payer: Self-pay

## 2022-08-30 ENCOUNTER — Encounter: Payer: Self-pay | Admitting: Nurse Practitioner

## 2022-08-30 ENCOUNTER — Ambulatory Visit (INDEPENDENT_AMBULATORY_CARE_PROVIDER_SITE_OTHER): Payer: Medicaid Other | Admitting: Nurse Practitioner

## 2022-08-30 ENCOUNTER — Other Ambulatory Visit: Payer: Self-pay

## 2022-08-30 VITALS — BP 118/80 | HR 95 | Temp 98.0°F | Ht 73.0 in | Wt 268.6 lb

## 2022-08-30 DIAGNOSIS — E119 Type 2 diabetes mellitus without complications: Secondary | ICD-10-CM

## 2022-08-30 DIAGNOSIS — R600 Localized edema: Secondary | ICD-10-CM

## 2022-08-30 DIAGNOSIS — I10 Essential (primary) hypertension: Secondary | ICD-10-CM | POA: Diagnosis not present

## 2022-08-30 DIAGNOSIS — E1165 Type 2 diabetes mellitus with hyperglycemia: Secondary | ICD-10-CM | POA: Diagnosis not present

## 2022-08-30 LAB — POCT GLYCOSYLATED HEMOGLOBIN (HGB A1C): Hemoglobin A1C: 7.9 % — AB (ref 4.0–5.6)

## 2022-08-30 NOTE — Progress Notes (Signed)
Established Patient Office Visit  Subjective:  Patient ID: Lawrence Murray, male    DOB: 1965-11-23  Age: 57 y.o. MRN: [id number]  CC:  Chief Complaint  Patient presents with   Medical Management of Chronic Issues     HPI  [name] presents for routine care.  He check BS at home and his average BS 160 at home.  Patient states that he has significant improvement in his functional status after cardiac rehabilitation.  He is followed by the cardiologist Dr. Mylo Red.    HPI   Past Medical History:  Diagnosis Date   CHF (congestive heart failure) ([place])    Coronary artery disease    Diabetes mellitus without complication ([place])    Hypertension     Past Surgical History:  Procedure Laterality Date   COLONOSCOPY WITH PROPOFOL N/A 06/14/2022   Procedure: COLONOSCOPY WITH PROPOFOL;  Surgeon: [name], MD;  Location: [facility] ENDOSCOPY;  Service: Gastroenterology;  Laterality: N/A;   LOWER EXTREMITY ANGIOGRAPHY Left 02/14/2022   Procedure: Lower Extremity Angiography;  Surgeon: Algernon Huxley, MD;  Location: [facility] CV LAB;  Service: Cardiovascular;  Laterality: Left;   RIGHT/LEFT HEART CATH AND CORONARY ANGIOGRAPHY N/A 02/21/2022   Procedure: RIGHT/LEFT HEART CATH AND CORONARY ANGIOGRAPHY;  Surgeon: Nelva Bush, MD;  Location: [place];  Service: Cardiovascular;  Laterality: N/A;    Family History  Problem Relation Age of Onset   Congestive Heart Failure Father     Social History   Socioeconomic History   Marital status: Single    Spouse name: Not on file   Number of children: Not on file   Years of education: Not on file   Highest education level: Not on file  Occupational History   Not on file  Tobacco Use   Smoking status: Former    Packs/day: 1.00    Years: 32.00    Total pack years: 32.00    Types: Cigarettes    Quit date: 02/04/2022    Years since quitting: 0.5   Smokeless tobacco: Never  Vaping Use   Vaping Use: Not on file  Substance and  Sexual Activity   Alcohol use: Not Currently   Drug use: Never   Sexual activity: Not Currently  Other Topics Concern   Not on file  Social History Narrative   Not on file   Social Determinants of Health   Financial Resource Strain: High Risk (03/09/2022)   Overall Financial Resource Strain (CARDIA)    Difficulty of Paying Living Expenses: Hard  Food Insecurity: Food Insecurity Present (03/09/2022)   Hunger Vital Sign    Worried About Running Out of Food in the Last Year: Sometimes true    Ran Out of Food in the Last Year: Sometimes true  Transportation Needs: No Transportation Needs (06/21/2022)   PRAPARE - Hydrologist (Medical): No    Lack of Transportation (Non-Medical): No  Physical Activity: Not on file  Stress: Not on file  Social Connections: Not on file  Intimate Partner Violence: Not on file     Outpatient Medications Prior to Visit  Medication Sig Dispense Refill   Accu-Chek Softclix Lancets lancets Use as directed to check blood glucose daily 100 each 0   acetaminophen (TYLENOL) 500 MG tablet Take 2 tablets (1,000 mg total) by mouth every 6 (six) hours as needed for mild pain, moderate pain, fever or headache. 30 tablet 0   ALPRAZolam (XANAX) 1 MG tablet Take 0.5 tablets (0.5 mg  total) by mouth 3 (three) times daily as needed for anxiety. 90 tablet 0   aspirin EC 81 MG tablet Take 1 tablet (81 mg total) by mouth daily. Swallow whole. 90 tablet 2   atorvastatin (LIPITOR) 80 MG tablet Take 1 tablet (80 mg total) by mouth daily. 90 tablet 2   Blood Glucose Monitoring Suppl (ACCU-CHEK GUIDE ME) w/Device KIT Use as directed daily 1 kit 0   dapagliflozin propanediol (FARXIGA) 10 MG TABS tablet Take one tablet by mouth daily 90 tablet 3   digoxin (LANOXIN) 0.125 MG tablet Take 1 tablet (0.125 mg total) by mouth daily. 90 tablet 2   docusate sodium (STOOL SOFTENER) 250 MG capsule Take 250 mg by mouth daily.     furosemide (LASIX) 40 MG tablet Take  1.5 tablets (60 mg total) by mouth daily. 135 tablet 3   gabapentin (NEURONTIN) 100 MG capsule Take 1 capsule (100 mg total) by mouth 3 (three) times daily. 60 capsule 1   glucose blood test strip Use as directed once daily 100 each 0   metFORMIN (GLUCOPHAGE) 500 MG tablet Take 1 tablet (500 mg total) by mouth 2 (two) times daily. 180 tablet 3   metoprolol succinate (TOPROL-XL) 25 MG 24 hr tablet Take 0.5 tablets (12.5 mg total) by mouth daily. 45 tablet 2   pantoprazole (PROTONIX) 40 MG tablet Take 1 tablet (40 mg total) by mouth daily. 30 tablet 11   potassium chloride SA (KLOR-CON M) 20 MEQ tablet Take 1 tablet (20 mEq total) by mouth daily. 90 tablet 2   spironolactone (ALDACTONE) 25 MG tablet Take 0.5 tablets (12.5 mg total) by mouth daily. 45 tablet 2   Zoster Vaccine Adjuvanted [facility]) injection Inject into the muscle. 0.5 mL 1   No facility-administered medications prior to visit.    No Known Allergies  ROS Review of Systems  Constitutional: Negative.   HENT: Negative.    Eyes: Negative.   Respiratory: Negative.    Cardiovascular:  Negative for chest pain and leg swelling.  Gastrointestinal: Negative.   Endocrine: Negative.   Genitourinary: Negative.   Neurological:  Positive for numbness.      Objective:    Physical Exam Constitutional:      Appearance: He is obese.  HENT:     Head: Normocephalic and atraumatic.     Mouth/Throat:     Mouth: Mucous membranes are moist.  Eyes:     Pupils: Pupils are equal, round, and reactive to light.  Cardiovascular:     Rate and Rhythm: Normal rate and regular rhythm.     Pulses: Normal pulses.     Heart sounds: Normal heart sounds.  Pulmonary:     Effort: Pulmonary effort is normal.     Breath sounds: Normal breath sounds.  Abdominal:     General: Bowel sounds are normal.     Palpations: Abdomen is soft. There is no mass.     Tenderness: There is no abdominal tenderness.  Musculoskeletal:     Right lower leg: Edema  present.     Left lower leg: Edema present.  Neurological:     General: No focal deficit present.     Mental Status: He is alert and oriented to person, place, and time. Mental status is at baseline.  Psychiatric:        Mood and Affect: Mood normal.        Behavior: Behavior normal.        Thought Content: Thought content normal.  Judgment: Judgment normal.     BP 118/80   Pulse 95   Temp 98 F (36.7 C) (Oral)   Ht '6\' 1"'$  (1.854 m)   Wt 268 lb 9.6 oz (121.8 kg)   SpO2 97%   BMI 35.44 kg/m  Wt Readings from Last 3 Encounters:  09/05/22 270 lb (122.5 kg)  08/31/22 272 lb 6.4 oz (123.6 kg)  08/30/22 268 lb 9.6 oz (121.8 kg)     Health Maintenance  Topic Date Due   Diabetic kidney evaluation - Urine ACR  Never done   Zoster Vaccines- Shingrix (2 of 2) 08/03/2022   Hepatitis C Screening  06/02/2023 (Originally 06/09/1985)   INFLUENZA VACCINE  06/20/2023 (Originally 03/20/2022)   COVID-19 Vaccine (1) 06/20/2023 (Originally 12/09/1967)   HEMOGLOBIN A1C  02/28/2023   FOOT EXAM  05/16/2023   OPHTHALMOLOGY EXAM  07/25/2023   Lung Cancer Screening  08/02/2023   Diabetic kidney evaluation - eGFR measurement  09/06/2023   COLONOSCOPY (Pts 45-62yr Insurance coverage will need to be confirmed)  06/14/2029   DTaP/Tdap/Td (2 - Td or Tdap) 06/01/2032   HIV Screening  Completed   HPV VACCINES  Aged Out    There are no preventive care reminders to display for this patient.  Lab Results  Component Value Date   TSH 9.011 (H) 02/05/2022   Lab Results  Component Value Date   WBC 7.3 02/23/2022   HGB 13.3 02/23/2022   HCT 40.8 02/23/2022   MCV 94.0 02/23/2022   PLT 368 02/23/2022   Lab Results  Component Value Date   NA 134 (L) 09/05/2022   K 4.5 09/05/2022   CO2 20 (L) 09/05/2022   GLUCOSE 249 (H) 09/05/2022   BUN 19 09/05/2022   CREATININE 1.21 09/05/2022   BILITOT 1.0 02/11/2022   ALKPHOS 78 02/11/2022   AST 22 02/11/2022   ALT 27 02/11/2022   PROT 6.8  02/11/2022   ALBUMIN 3.1 (L) 02/11/2022   CALCIUM 9.2 09/05/2022   ANIONGAP 13 09/05/2022   Lab Results  Component Value Date   CHOL 242 (H) 02/24/2022   Lab Results  Component Value Date   HDL 35 (L) 02/24/2022   Lab Results  Component Value Date   LDLCALC 160 (H) 02/24/2022   Lab Results  Component Value Date   TRIG 235 (H) 02/24/2022   Lab Results  Component Value Date   CHOLHDL 6.9 02/24/2022   Lab Results  Component Value Date   HGBA1C 7.9 (A) 08/30/2022      Assessment & Plan:   Problem List Items Addressed This Visit       Cardiovascular and Mediastinum   Primary hypertension    His blood pressure 118/80 in the office today. Encourage patient to consume low-salt and heart healthy diet. Continue the current medication regimen.        Endocrine   Type 2 diabetes mellitus with hyperglycemia ([place] - Primary    He is POCT hemoglobin A1c 7.9 in the office today Advised pt to check the BS regularly, make a log and bring to next appointment.  Advised pt to eat variety of food including fruits, vegetables, whole grains, complex carbohydrates and proteins.  Continue metformin 500 mg twice a day and Farxiga 10 mg daily.       Relevant Orders   POCT glycosylated hemoglobin (Hb A1C) (Completed)     Other   Bilateral edema of lower extremity    Swelling improved from previous visit. Encourage patient to compression stockings and  elevate the feet. Continue the current medication regimen        No orders of the defined types were placed in this encounter.    Follow-up: Return in about 4 months (around 12/29/2022).    Theresia Lo, NP

## 2022-08-31 ENCOUNTER — Ambulatory Visit: Payer: Medicaid Other | Attending: Cardiology | Admitting: Cardiology

## 2022-08-31 ENCOUNTER — Encounter: Payer: Self-pay | Admitting: Cardiology

## 2022-08-31 VITALS — BP 104/64 | HR 74 | Ht 73.0 in | Wt 272.4 lb

## 2022-08-31 DIAGNOSIS — I255 Ischemic cardiomyopathy: Secondary | ICD-10-CM

## 2022-08-31 DIAGNOSIS — I2510 Atherosclerotic heart disease of native coronary artery without angina pectoris: Secondary | ICD-10-CM

## 2022-08-31 DIAGNOSIS — E782 Mixed hyperlipidemia: Secondary | ICD-10-CM | POA: Diagnosis not present

## 2022-08-31 NOTE — Patient Instructions (Signed)

## 2022-08-31 NOTE — Progress Notes (Signed)
Cardiology Office Note:    Date:  08/31/2022   ID:  Lawrence Murray, DOB 09-27-1965, MRN [id number]  PCP:  Theresia Lo, NP   [facility] Cardiologist:  Kate Sable, MD     Referring MD: Theresia Lo, NP   Chief Complaint  Patient presents with   Follow-up    3 month f/u, swelling legs and feet     History of Present Illness:    Lawrence Murray is a 57 y.o. male with a hx of multivessel CAD ([place] 7/23- CTO D1, OM1, mid LAD, and ostial rPDA)  , HFrEF 30%, obesity, former smoker x20+ years who presents for follow-up.  Being seen for CAD, ischemic cardiomyopathy.  Successfully underwent cardiac rehab, now performs activities on his own.  States going to the gym 4 times a week, performing exercises for about 45 to 60 minutes.  Denies chest pain, occasional chest pressure, edema is much improved with current dose of Lasix.  Takes medications as prescribed, also states eating healthier.  Quit smoking, has not smoked for about several months now.  Prior notes/studies Echo 11/23 EF 30% CMR 02/2022 EF 60%, nonviable anterior and apical wall, viable lateral and inferior walls.  Past Medical History:  Diagnosis Date   CHF (congestive heart failure) ([facility])    Coronary artery disease    Diabetes mellitus without complication ([place])    Hypertension     Past Surgical History:  Procedure Laterality Date   COLONOSCOPY WITH PROPOFOL N/A 06/14/2022   Procedure: COLONOSCOPY WITH PROPOFOL;  Surgeon: [name], MD;  Location: [facility] ENDOSCOPY;  Service: Gastroenterology;  Laterality: N/A;   LOWER EXTREMITY ANGIOGRAPHY Left 02/14/2022   Procedure: Lower Extremity Angiography;  Surgeon: Algernon Huxley, MD;  Location: [place] LAB;  Service: Cardiovascular;  Laterality: Left;   RIGHT/LEFT HEART CATH AND CORONARY ANGIOGRAPHY N/A 02/21/2022   Procedure: RIGHT/LEFT HEART CATH AND CORONARY ANGIOGRAPHY;  Surgeon: Nelva Bush, MD;  Location: [place];   Service: Cardiovascular;  Laterality: N/A;    Current Medications: No outpatient medications have been marked as taking for the 08/31/22 encounter (Office Visit) with Kate Sable, MD.     Allergies:   Patient has no known allergies.   Social History   Socioeconomic History   Marital status: Single    Spouse name: Not on file   Number of children: Not on file   Years of education: Not on file   Highest education level: Not on file  Occupational History   Not on file  Tobacco Use   Smoking status: Former    Packs/day: 1.00    Years: 32.00    Total pack years: 32.00    Types: Cigarettes    Quit date: 02/04/2022    Years since quitting: 0.5   Smokeless tobacco: Never  Vaping Use   Vaping Use: Not on file  Substance and Sexual Activity   Alcohol use: Not Currently   Drug use: Never   Sexual activity: Not Currently  Other Topics Concern   Not on file  Social History Narrative   Not on file   Social Determinants of Health   Financial Resource Strain: High Risk (03/09/2022)   Overall Financial Resource Strain (CARDIA)    Difficulty of Paying Living Expenses: Hard  Food Insecurity: Food Insecurity Present (03/09/2022)   Hunger Vital Sign    Worried About Running Out of Food in the Last Year: Sometimes true    Ran Out of Food in the Last Year: Sometimes  true  Transportation Needs: No Transportation Needs (06/21/2022)   PRAPARE - Hydrologist (Medical): No    Lack of Transportation (Non-Medical): No  Physical Activity: Not on file  Stress: Not on file  Social Connections: Not on file     Family History: The patient's family history includes Congestive Heart Failure in his father.  ROS:   Please see the history of present illness.     All other systems reviewed and are negative.  EKGs/Labs/Other Studies Reviewed:    The following studies were reviewed today:   EKG:  EKG is  ordered today.  The ekg ordered today demonstrates normal  sinus rhythm, heart rate 74  Recent Labs: 02/05/2022: TSH 9.011 02/11/2022: ALT 27 02/14/2022: Magnesium 2.4 02/23/2022: Hemoglobin 13.3; Platelets 368 04/11/2022: B Natriuretic Peptide 347.0 06/08/2022: BUN 19; Creatinine, Ser 1.10; Potassium 4.7; Sodium 136  Recent Lipid Panel    Component Value Date/Time   CHOL 242 (H) 02/24/2022 0614   TRIG 235 (H) 02/24/2022 0614   HDL 35 (L) 02/24/2022 0614   CHOLHDL 6.9 02/24/2022 0614   VLDL 47 (H) 02/24/2022 0614   LDLCALC 160 (H) 02/24/2022 0614     Risk Assessment/Calculations:             Physical Exam:    VS:  BP 104/64 (BP Location: Left Arm, Patient Position: Sitting, Cuff Size: Large)   Pulse 74   Ht '6\' 1"'$  (1.854 m)   Wt 272 lb 6.4 oz (123.6 kg)   SpO2 98%   BMI 35.94 kg/m     Wt Readings from Last 3 Encounters:  08/31/22 272 lb 6.4 oz (123.6 kg)  08/30/22 268 lb 9.6 oz (121.8 kg)  08/21/22 263 lb (119.3 kg)     GEN:  Well nourished, well developed in no acute distress HEENT: Normal NECK: No JVD; No carotid bruits CARDIAC: RRR, no murmurs, rubs, gallops RESPIRATORY:  Clear to auscultation without rales, wheezing or rhonchi  ABDOMEN: Soft, non-tender, distended MUSCULOSKELETAL:  No edema; No deformity  SKIN: Warm and dry NEUROLOGIC:  Alert and oriented x 3 PSYCHIATRIC:  Normal affect   ASSESSMENT:    1. Ischemic cardiomyopathy   2. Coronary artery disease involving native coronary artery of native heart, unspecified whether angina present   3. Mixed hyperlipidemia    PLAN:    In order of problems listed above:  Ischemic cardiomyopathy EF 30%.  No edema, clear lungs.  Describes NYHA class II symptoms.  Continue Toprol-XL 12.5 mg daily, Aldactone 12.5 mg daily.  Farxiga, digoxin, Lasix 60 mg daily.  Low BP preventing up titration of GDMT.  Refer to EP for AICD consideration.  Refer to heart failure clinic. Multivessel CAD CAD (CTO D1, OM1, mid LAD, and ostial rPDA).  Not amenable to revascularization.  Denies  chest pain.  Continue aspirin, Lipitor 80, Toprol-XL. Hyperlipidemia.  Continue Lipitor 80 mg daily.  Follow-up in 6 months      Medication Adjustments/Labs and Tests Ordered: Current medicines are reviewed at length with the patient today.  Concerns regarding medicines are outlined above.  Orders Placed This Encounter  Procedures   AMB referral to CHF clinic   EKG 12-Lead   No orders of the defined types were placed in this encounter.   Patient Instructions  Medication Instructions:   Your physician recommends that you continue on your current medications as directed. Please refer to the Current Medication list given to you today.  *If you need a refill on  your cardiac medications before your next appointment, please call your pharmacy*   Lab Work:  None Ordered  If you have labs (blood work) drawn today and your tests are completely normal, you will receive your results only by: Welcome (if you have MyChart) OR A paper copy in the mail If you have any lab test that is abnormal or we need to change your treatment, we will call you to review the results.   Testing/Procedures:  None Ordered   Follow-Up: At [facility], you and your health needs are our priority.  As part of our continuing mission to provide you with exceptional heart care, we have created designated Provider Care Teams.  These Care Teams include your primary Cardiologist (physician) and Advanced Practice Providers (APPs -  Physician Assistants and Nurse Practitioners) who all work together to provide you with the care you need, when you need it.  We recommend signing up for the patient portal called "MyChart".  Sign up information is provided on this After Visit Summary.  MyChart is used to connect with patients for Virtual Visits (Telemedicine).  Patients are able to view lab/test results, encounter notes, upcoming appointments, etc.  Non-urgent messages can be sent to your provider as  well.   To learn more about what you can do with MyChart, go to [email or web address].    Your next appointment:   6 month(s)  Provider:   You may see Kate Sable, MD or one of the following Advanced Practice Providers on your designated Care Team:   Murray Hodgkins, NP Christell Faith, PA-C Cadence Kathlen Mody, PA-C Gerrie Nordmann, NP   Signed, Kate Sable, MD  08/31/2022 3:40 PM    [place]

## 2022-08-31 NOTE — H&P (View-Only) (Signed)
Cardiology Office Note:    Date:  08/31/2022   ID:  [name], DOB [birth year], MRN [id number]  PCP:  Theresia Lo, NP   [place] [facility] Cardiologist:  Kate Sable, MD     Referring MD: Theresia Lo, NP   Chief Complaint  Patient presents with   Follow-up    3 month f/u, swelling legs and feet     History of Present Illness:    Lawrence Murray is a 57 y.o. male with a hx of multivessel CAD ([place] 7/23- CTO D1, OM1, mid LAD, and ostial rPDA)  , HFrEF 30%, obesity, former smoker x20+ years who presents for follow-up.  Being seen for CAD, ischemic cardiomyopathy.  Successfully underwent cardiac rehab, now performs activities on his own.  States going to the gym 4 times a week, performing exercises for about 45 to 60 minutes.  Denies chest pain, occasional chest pressure, edema is much improved with current dose of Lasix.  Takes medications as prescribed, also states eating healthier.  Quit smoking, has not smoked for about several months now.  Prior notes/studies Echo 11/23 EF 30% CMR 02/2022 EF 60%, nonviable anterior and apical wall, viable lateral and inferior walls.  Past Medical History:  Diagnosis Date   CHF (congestive heart failure) (HCC)    Coronary artery disease    Diabetes mellitus without complication ([place])    Hypertension     Past Surgical History:  Procedure Laterality Date   COLONOSCOPY WITH PROPOFOL N/A 06/14/2022   Procedure: COLONOSCOPY WITH PROPOFOL;  Surgeon: [name], MD;  Location: [facility] ENDOSCOPY;  Service: Gastroenterology;  Laterality: N/A;   LOWER EXTREMITY ANGIOGRAPHY Left 02/14/2022   Procedure: Lower Extremity Angiography;  Surgeon: Algernon Huxley, MD;  Location: [place] CV LAB;  Service: Cardiovascular;  Laterality: Left;   RIGHT/LEFT HEART CATH AND CORONARY ANGIOGRAPHY N/A 02/21/2022   Procedure: RIGHT/LEFT HEART CATH AND CORONARY ANGIOGRAPHY;  Surgeon: Nelva Bush, MD;  Location: [place] CV LAB;   Service: Cardiovascular;  Laterality: N/A;    Current Medications: No outpatient medications have been marked as taking for the 08/31/22 encounter (Office Visit) with Kate Sable, MD.     Allergies:   Patient has no known allergies.   Social History   Socioeconomic History   Marital status: Single    Spouse name: Not on file   Number of children: Not on file   Years of education: Not on file   Highest education level: Not on file  Occupational History   Not on file  Tobacco Use   Smoking status: Former    Packs/day: 1.00    Years: 32.00    Total pack years: 32.00    Types: Cigarettes    Quit date: 02/04/2022    Years since quitting: 0.5   Smokeless tobacco: Never  Vaping Use   Vaping Use: Not on file  Substance and Sexual Activity   Alcohol use: Not Currently   Drug use: Never   Sexual activity: Not Currently  Other Topics Concern   Not on file  Social History Narrative   Not on file   Social Determinants of Health   Financial Resource Strain: High Risk (03/09/2022)   Overall Financial Resource Strain (CARDIA)    Difficulty of Paying Living Expenses: Hard  Food Insecurity: Food Insecurity Present (03/09/2022)   Hunger Vital Sign    Worried About Running Out of Food in the Last Year: Sometimes true    Ran Out of Food in the Last Year: Sometimes  true  Transportation Needs: No Transportation Needs (06/21/2022)   PRAPARE - Hydrologist (Medical): No    Lack of Transportation (Non-Medical): No  Physical Activity: Not on file  Stress: Not on file  Social Connections: Not on file     Family History: The patient's family history includes Congestive Heart Failure in his father.  ROS:   Please see the history of present illness.     All other systems reviewed and are negative.  EKGs/Labs/Other Studies Reviewed:    The following studies were reviewed today:   EKG:  EKG is  ordered today.  The ekg ordered today demonstrates normal  sinus rhythm, heart rate 74  Recent Labs: 02/05/2022: TSH 9.011 02/11/2022: ALT 27 02/14/2022: Magnesium 2.4 02/23/2022: Hemoglobin 13.3; Platelets 368 04/11/2022: B Natriuretic Peptide 347.0 06/08/2022: BUN 19; Creatinine, Ser 1.10; Potassium 4.7; Sodium 136  Recent Lipid Panel    Component Value Date/Time   CHOL 242 (H) 02/24/2022 0614   TRIG 235 (H) 02/24/2022 0614   HDL 35 (L) 02/24/2022 0614   CHOLHDL 6.9 02/24/2022 0614   VLDL 47 (H) 02/24/2022 0614   LDLCALC 160 (H) 02/24/2022 0614     Risk Assessment/Calculations:             Physical Exam:    VS:  BP 104/64 (BP Location: Left Arm, Patient Position: Sitting, Cuff Size: Large)   Pulse 74   Ht '6\' 1"'$  (1.854 m)   Wt 272 lb 6.4 oz (123.6 kg)   SpO2 98%   BMI 35.94 kg/m     Wt Readings from Last 3 Encounters:  08/31/22 272 lb 6.4 oz (123.6 kg)  08/30/22 268 lb 9.6 oz (121.8 kg)  08/21/22 263 lb (119.3 kg)     GEN:  Well nourished, well developed in no acute distress HEENT: Normal NECK: No JVD; No carotid bruits CARDIAC: RRR, no murmurs, rubs, gallops RESPIRATORY:  Clear to auscultation without rales, wheezing or rhonchi  ABDOMEN: Soft, non-tender, distended MUSCULOSKELETAL:  No edema; No deformity  SKIN: Warm and dry NEUROLOGIC:  Alert and oriented x 3 PSYCHIATRIC:  Normal affect   ASSESSMENT:    1. Ischemic cardiomyopathy   2. Coronary artery disease involving native coronary artery of native heart, unspecified whether angina present   3. Mixed hyperlipidemia    PLAN:    In order of problems listed above:  Ischemic cardiomyopathy EF 30%.  No edema, clear lungs.  Describes NYHA class II symptoms.  Continue Toprol-XL 12.5 mg daily, Aldactone 12.5 mg daily.  Farxiga, digoxin, Lasix 60 mg daily.  Low BP preventing up titration of GDMT.  Refer to EP for AICD consideration.  Refer to heart failure clinic. Multivessel CAD CAD (CTO D1, OM1, mid LAD, and ostial rPDA).  Not amenable to revascularization.  Denies  chest pain.  Continue aspirin, Lipitor 80, Toprol-XL. Hyperlipidemia.  Continue Lipitor 80 mg daily.  Follow-up in 6 months      Medication Adjustments/Labs and Tests Ordered: Current medicines are reviewed at length with the patient today.  Concerns regarding medicines are outlined above.  Orders Placed This Encounter  Procedures   AMB referral to CHF clinic   EKG 12-Lead   No orders of the defined types were placed in this encounter.   Patient Instructions  Medication Instructions:   Your physician recommends that you continue on your current medications as directed. Please refer to the Current Medication list given to you today.  *If you need a refill on  your cardiac medications before your next appointment, please call your pharmacy*   Lab Work:  None Ordered  If you have labs (blood work) drawn today and your tests are completely normal, you will receive your results only by: [place] (if you have MyChart) OR A paper copy in the mail If you have any lab test that is abnormal or we need to change your treatment, we will call you to review the results.   Testing/Procedures:  None Ordered   Follow-Up: At [facility], you and your health needs are our priority.  As part of our continuing mission to provide you with exceptional heart care, we have created designated Provider Care Teams.  These Care Teams include your primary Cardiologist (physician) and Advanced Practice Providers (APPs -  Physician Assistants and Nurse Practitioners) who all work together to provide you with the care you need, when you need it.  We recommend signing up for the patient portal called "MyChart".  Sign up information is provided on this After Visit Summary.  MyChart is used to connect with patients for Virtual Visits (Telemedicine).  Patients are able to view lab/test results, encounter notes, upcoming appointments, etc.  Non-urgent messages can be sent to your provider as  well.   To learn more about what you can do with MyChart, go to [email or web address].    Your next appointment:   6 month(s)  Provider:   You may see Kate Sable, MD or one of the following Advanced Practice Providers on your designated Care Team:   Murray Hodgkins, NP Christell Faith, PA-C Cadence Kathlen Mody, PA-C Gerrie Nordmann, NP   Signed, Kate Sable, MD  08/31/2022 3:40 PM    [place]

## 2022-09-05 ENCOUNTER — Ambulatory Visit: Payer: Medicaid Other | Attending: Cardiology | Admitting: Cardiology

## 2022-09-05 ENCOUNTER — Telehealth: Payer: Self-pay | Admitting: Cardiology

## 2022-09-05 ENCOUNTER — Encounter: Payer: Self-pay | Admitting: Cardiology

## 2022-09-05 ENCOUNTER — Other Ambulatory Visit
Admission: RE | Admit: 2022-09-05 | Discharge: 2022-09-05 | Disposition: A | Discharge status: Home / Self Care | Payer: Medicaid Other | Source: Ambulatory Visit | Attending: Cardiology | Admitting: Cardiology

## 2022-09-05 VITALS — BP 116/70 | HR 96 | Ht 73.0 in | Wt 270.0 lb

## 2022-09-05 DIAGNOSIS — I5022 Chronic systolic (congestive) heart failure: Secondary | ICD-10-CM | POA: Diagnosis not present

## 2022-09-05 DIAGNOSIS — I2510 Atherosclerotic heart disease of native coronary artery without angina pectoris: Secondary | ICD-10-CM | POA: Diagnosis not present

## 2022-09-05 DIAGNOSIS — I255 Ischemic cardiomyopathy: Secondary | ICD-10-CM | POA: Diagnosis not present

## 2022-09-05 DIAGNOSIS — I10 Essential (primary) hypertension: Secondary | ICD-10-CM

## 2022-09-05 LAB — BASIC METABOLIC PANEL
Anion gap: 13 (ref 5–15)
BUN: 19 mg/dL (ref 6–20)
CO2: 20 mmol/L — ABNORMAL LOW (ref 22–32)
Calcium: 9.2 mg/dL (ref 8.9–10.3)
Chloride: 101 mmol/L (ref 98–111)
Creatinine, Ser: 1.21 mg/dL (ref 0.61–1.24)
GFR, Estimated: >60 mL/min (ref >60)
Glucose, Bld: 249 mg/dL — ABNORMAL HIGH (ref 70–99)
Potassium: 4.5 mmol/L (ref 3.5–5.1)
Sodium: 134 mmol/L — ABNORMAL LOW (ref 135–145)

## 2022-09-05 NOTE — Telephone Encounter (Signed)
Left message for patient to call back.

## 2022-09-05 NOTE — Telephone Encounter (Signed)
Lab called stating unable to run CBC as tube clotted. Will forward your MD's nurse.

## 2022-09-05 NOTE — Assessment & Plan Note (Addendum)
He is POCT hemoglobin A1c 7.9 in the office today Advised pt to check the BS regularly, make a log and bring to next appointment.  Advised pt to eat variety of food including fruits, vegetables, whole grains, complex carbohydrates and proteins.  Continue metformin 500 mg twice a day and Farxiga 10 mg daily.

## 2022-09-05 NOTE — Patient Instructions (Signed)
Medication Instructions:  Your physician recommends that you continue on your current medications as directed. Please refer to the Current Medication list given to you today.  *If you need a refill on your cardiac medications before your next appointment, please call your pharmacy*  Lab Work: BMET and CBC prior to procedure (see instruction letter) If you have labs (blood work) drawn today and your tests are completely normal, you will receive your results only by: MyChart Message (if you have MyChart) OR A paper copy in the mail If you have any lab test that is abnormal or we need to change your treatment, we will call you to review the results.  Testing/Procedures: Your physician has recommended that you have a defibrillator inserted. An implantable cardioverter defibrillator (ICD) is a small device that is placed in your chest or, in rare cases, your abdomen. This device uses electrical pulses or shocks to help control life-threatening, irregular heartbeats that could lead the heart to suddenly stop beating (sudden cardiac arrest). Leads are attached to the ICD that goes into your heart. This is done in the hospital and usually requires an overnight stay. Please see the instruction sheet given to you today for more information.  Follow-Up: At [facility], you and your health needs are our priority.  As part of our continuing mission to provide you with exceptional heart care, we have created designated Provider Care Teams.  These Care Teams include your primary Cardiologist (physician) and Advanced Practice Providers (APPs -  Physician Assistants and Nurse Practitioners) who all work together to provide you with the care you need, when you need it.  Your next appointment:   We will contact you to arrange follow up  Provider:   Lars Mage, MD

## 2022-09-05 NOTE — Telephone Encounter (Signed)
Lab is calling to talk with triage nurse about patient

## 2022-09-05 NOTE — Progress Notes (Signed)
Electrophysiology Office Note:    Date:  09/05/2022   ID:  Lawrence Murray, DOB 1965/08/24, MRN [id number]  PCP:  Theresia Lo, NP  [facility] Cardiologist:  Kate Sable, MD  [facility] Electrophysiologist:  Vickie Epley, MD   Referring MD: Antony Madura, PA-C   Chief Complaint: Chronic systolic heart failure  History of Present Illness:    Lawrence Murray is a 57 y.o. male who presents for an evaluation of chronic systolic heart failure at the request of Dr. Garen Lah. Their medical history includes multivessel coronary artery disease, chronic systolic heart failure with an ejection fraction of 30%, obesity, prior tobacco abuse.  The patient last saw Dr. Garen Lah August 31, 2022.  He is quite active and goes to the gym several times per week.  He has had heart failure symptoms in the past with edema but this is improved on his current Lasix dose.  He is on good medical therapy.  He is referred to discuss defibrillator implant.     Past Medical History:  Diagnosis Date   CHF (congestive heart failure) (HCC)    Coronary artery disease    Diabetes mellitus without complication ([place])    Hypertension     Past Surgical History:  Procedure Laterality Date   COLONOSCOPY WITH PROPOFOL N/A 06/14/2022   Procedure: COLONOSCOPY WITH PROPOFOL;  Surgeon: [name], MD;  Location: [facility] ENDOSCOPY;  Service: Gastroenterology;  Laterality: N/A;   LOWER EXTREMITY ANGIOGRAPHY Left 02/14/2022   Procedure: Lower Extremity Angiography;  Surgeon: Algernon Huxley, MD;  Location: [place] CV LAB;  Service: Cardiovascular;  Laterality: Left;   RIGHT/LEFT HEART CATH AND CORONARY ANGIOGRAPHY N/A 02/21/2022   Procedure: RIGHT/LEFT HEART CATH AND CORONARY ANGIOGRAPHY;  Surgeon: Nelva Bush, MD;  Location: [place] CV LAB;  Service: Cardiovascular;  Laterality: N/A;    Current Medications: Current Meds  Medication Sig   acetaminophen (TYLENOL) 500 MG tablet Take 2  tablets (1,000 mg total) by mouth every 6 (six) hours as needed for mild pain, moderate pain, fever or headache.   ALPRAZolam (XANAX) 1 MG tablet Take 0.5 tablets (0.5 mg total) by mouth 3 (three) times daily as needed for anxiety.   aspirin EC 81 MG tablet Take 1 tablet (81 mg total) by mouth daily. Swallow whole.   atorvastatin (LIPITOR) 80 MG tablet Take 1 tablet (80 mg total) by mouth daily.   dapagliflozin propanediol (FARXIGA) 10 MG TABS tablet Take one tablet by mouth daily   digoxin (LANOXIN) 0.125 MG tablet Take 1 tablet (0.125 mg total) by mouth daily.   docusate sodium (STOOL SOFTENER) 250 MG capsule Take 250 mg by mouth daily.   furosemide (LASIX) 40 MG tablet Take 1.5 tablets (60 mg total) by mouth daily.   gabapentin (NEURONTIN) 100 MG capsule Take 1 capsule (100 mg total) by mouth 3 (three) times daily.   metFORMIN (GLUCOPHAGE) 500 MG tablet Take 1 tablet (500 mg total) by mouth 2 (two) times daily.   metoprolol succinate (TOPROL-XL) 25 MG 24 hr tablet Take 0.5 tablets (12.5 mg total) by mouth daily.   pantoprazole (PROTONIX) 40 MG tablet Take 1 tablet (40 mg total) by mouth daily.   potassium chloride SA (KLOR-CON M) 20 MEQ tablet Take 1 tablet (20 mEq total) by mouth daily.   spironolactone (ALDACTONE) 25 MG tablet Take 0.5 tablets (12.5 mg total) by mouth daily.     Allergies:   Patient has no known allergies.   Social History   Socioeconomic History  Marital status: Single    Spouse name: Not on file   Number of children: Not on file   Years of education: Not on file   Highest education level: Not on file  Occupational History   Not on file  Tobacco Use   Smoking status: Former    Packs/day: 1.00    Years: 32.00    Total pack years: 32.00    Types: Cigarettes    Quit date: 02/04/2022    Years since quitting: 0.5   Smokeless tobacco: Never  Vaping Use   Vaping Use: Not on file  Substance and Sexual Activity   Alcohol use: Not Currently   Drug use: Never    Sexual activity: Not Currently  Other Topics Concern   Not on file  Social History Narrative   Not on file   Social Determinants of Health   Financial Resource Strain: High Risk (03/09/2022)   Overall Financial Resource Strain (CARDIA)    Difficulty of Paying Living Expenses: Hard  Food Insecurity: Food Insecurity Present (03/09/2022)   Hunger Vital Sign    Worried About Running Out of Food in the Last Year: Sometimes true    Ran Out of Food in the Last Year: Sometimes true  Transportation Needs: No Transportation Needs (06/21/2022)   PRAPARE - Hydrologist (Medical): No    Lack of Transportation (Non-Medical): No  Physical Activity: Not on file  Stress: Not on file  Social Connections: Not on file     Family History: The patient's family history includes Congestive Heart Failure in his father.  ROS:   Please see the history of present illness.    All other systems reviewed and are negative.  EKGs/Labs/Other Studies Reviewed:    The following studies were reviewed today:  July 2023 cardiac MRI shows an EF of 16%, anterior/apical scar.  Significantly reduced RV function. .  July 19, 2022 echo shows EF 30%, RV function normal, mild MR  August 31, 2022 EKG shows sinus rhythm.  Narrow QRS.  Recent Labs: 02/05/2022: TSH 9.011 02/11/2022: ALT 27 02/14/2022: Magnesium 2.4 02/23/2022: Hemoglobin 13.3; Platelets 368 04/11/2022: B Natriuretic Peptide 347.0 09/05/2022: BUN 19; Creatinine, Ser 1.21; Potassium 4.5; Sodium 134  Recent Lipid Panel    Component Value Date/Time   CHOL 242 (H) 02/24/2022 0614   TRIG 235 (H) 02/24/2022 0614   HDL 35 (L) 02/24/2022 0614   CHOLHDL 6.9 02/24/2022 0614   VLDL 47 (H) 02/24/2022 0614   LDLCALC 160 (H) 02/24/2022 5956    Physical Exam:    VS:  BP 116/70   Pulse 96   Ht '6\' 1"'$  (1.854 m)   Wt 270 lb (122.5 kg)   BMI 35.62 kg/m     Wt Readings from Last 3 Encounters:  09/05/22 270 lb (122.5 kg)   08/31/22 272 lb 6.4 oz (123.6 kg)  08/30/22 268 lb 9.6 oz (121.8 kg)     GEN:  Well nourished, well developed in no acute distress CARDIAC: RRR, no murmurs, rubs, gallops PSYCHIATRIC:  Normal affect       ASSESSMENT:    1. Chronic systolic heart failure ([place])   2. Ischemic cardiomyopathy   3. Coronary artery disease involving native coronary artery of native heart, unspecified whether angina present   4. Primary hypertension    PLAN:    In order of problems listed above:  #Chronic systolic heart failure NYHA class II.  Warm and dry on exam.  On good GDMT.  I do think he is a candidate for primary prevention ICD.  I think he is at particularly high risk for sudden cardiac death given the scar present on his cardiac MRI.  The patient has an ischemic CM (EF 30%), NYHA Class III CHF, and CAD.  He is referred by Dr Garen Lah for risk stratification of sudden death and consideration of ICD implantation.  At this time, he meets criteria for ICD implantation for primary prevention of sudden death.  I have had a thorough discussion with the patient reviewing options.  The patient and their family (if available) have had opportunities to ask questions and have them answered. The patient and I have decided together through a shared decision making process to proceed with ICD implant at this time.    Risks, benefits, alternatives to ICD implantation were discussed in detail with the patient today. The patient understands that the risks include but are not limited to bleeding, infection, pneumothorax, perforation, tamponade, vascular damage, renal failure, MI, stroke, death, inappropriate shocks, and lead dislodgement and wishes to proceed.  We will therefore schedule device implantation at the next available time.  Plan for a [employer] VVI ICD.  He will continue on aspirin uninterrupted given his extensive coronary artery disease history.  #Hypertension At goal today.  Recommend  checking blood pressures 1-2 times per week at home and recording the values.  Recommend bringing these recordings to the primary care physician.    Medication Adjustments/Labs and Tests Ordered: Current medicines are reviewed at length with the patient today.  Concerns regarding medicines are outlined above.  Orders Placed This Encounter  Procedures   Basic metabolic panel   No orders of the defined types were placed in this encounter.    Signed, Hilton Cork. Quentin Ore, MD, [facility], [facility] 09/05/2022 7:57 PM    Electrophysiology [facility]

## 2022-09-05 NOTE — H&P (View-Only) (Signed)
Electrophysiology Office Note:    Date:  09/05/2022   ID:  Lawrence Murray, DOB [birth year], MRN [id number]  PCP:  Theresia Lo, NP  [facility] Cardiologist:  Kate Sable, MD  [facility] Electrophysiologist:  Vickie Epley, MD   Referring MD: Antony Madura, PA-C   Chief Complaint: Chronic systolic heart failure  History of Present Illness:    Lawrence Murray is a 57 y.o. male who presents for an evaluation of chronic systolic heart failure at the request of Dr. Garen Lah. Their medical history includes multivessel coronary artery disease, chronic systolic heart failure with an ejection fraction of 30%, obesity, prior tobacco abuse.  The patient last saw Dr. Garen Lah August 31, 2022.  He is quite active and goes to the gym several times per week.  He has had heart failure symptoms in the past with edema but this is improved on his current Lasix dose.  He is on good medical therapy.  He is referred to discuss defibrillator implant.     Past Medical History:  Diagnosis Date   CHF (congestive heart failure) (HCC)    Coronary artery disease    Diabetes mellitus without complication ([place])    Hypertension     Past Surgical History:  Procedure Laterality Date   COLONOSCOPY WITH PROPOFOL N/A 06/14/2022   Procedure: COLONOSCOPY WITH PROPOFOL;  Surgeon: [name], MD;  Location: [facility] ENDOSCOPY;  Service: Gastroenterology;  Laterality: N/A;   LOWER EXTREMITY ANGIOGRAPHY Left 02/14/2022   Procedure: Lower Extremity Angiography;  Surgeon: Algernon Huxley, MD;  Location: [place] CV LAB;  Service: Cardiovascular;  Laterality: Left;   RIGHT/LEFT HEART CATH AND CORONARY ANGIOGRAPHY N/A 02/21/2022   Procedure: RIGHT/LEFT HEART CATH AND CORONARY ANGIOGRAPHY;  Surgeon: Nelva Bush, MD;  Location: [place] [facility];  Service: Cardiovascular;  Laterality: N/A;    Current Medications: Current Meds  Medication Sig   acetaminophen (TYLENOL) 500 MG tablet Take 2  tablets (1,000 mg total) by mouth every 6 (six) hours as needed for mild pain, moderate pain, fever or headache.   ALPRAZolam (XANAX) 1 MG tablet Take 0.5 tablets (0.5 mg total) by mouth 3 (three) times daily as needed for anxiety.   aspirin EC 81 MG tablet Take 1 tablet (81 mg total) by mouth daily. Swallow whole.   atorvastatin (LIPITOR) 80 MG tablet Take 1 tablet (80 mg total) by mouth daily.   dapagliflozin propanediol (FARXIGA) 10 MG TABS tablet Take one tablet by mouth daily   digoxin (LANOXIN) 0.125 MG tablet Take 1 tablet (0.125 mg total) by mouth daily.   docusate sodium (STOOL SOFTENER) 250 MG capsule Take 250 mg by mouth daily.   furosemide (LASIX) 40 MG tablet Take 1.5 tablets (60 mg total) by mouth daily.   gabapentin (NEURONTIN) 100 MG capsule Take 1 capsule (100 mg total) by mouth 3 (three) times daily.   metFORMIN (GLUCOPHAGE) 500 MG tablet Take 1 tablet (500 mg total) by mouth 2 (two) times daily.   metoprolol succinate (TOPROL-XL) 25 MG 24 hr tablet Take 0.5 tablets (12.5 mg total) by mouth daily.   pantoprazole (PROTONIX) 40 MG tablet Take 1 tablet (40 mg total) by mouth daily.   potassium chloride SA (KLOR-CON M) 20 MEQ tablet Take 1 tablet (20 mEq total) by mouth daily.   spironolactone (ALDACTONE) 25 MG tablet Take 0.5 tablets (12.5 mg total) by mouth daily.     Allergies:   Patient has no known allergies.   Social History   Socioeconomic History  Marital status: Single    Spouse name: Not on file   Number of children: Not on file   Years of education: Not on file   Highest education level: Not on file  Occupational History   Not on file  Tobacco Use   Smoking status: Former    Packs/day: 1.00    Years: 32.00    Total pack years: 32.00    Types: Cigarettes    Quit date: 02/04/2022    Years since quitting: 0.5   Smokeless tobacco: Never  Vaping Use   Vaping Use: Not on file  Substance and Sexual Activity   Alcohol use: Not Currently   Drug use: Never    Sexual activity: Not Currently  Other Topics Concern   Not on file  Social History Narrative   Not on file   Social Determinants of Health   Financial Resource Strain: High Risk (03/09/2022)   Overall Financial Resource Strain (CARDIA)    Difficulty of Paying Living Expenses: Hard  Food Insecurity: Food Insecurity Present (03/09/2022)   Hunger Vital Sign    Worried About Running Out of Food in the Last Year: Sometimes true    Ran Out of Food in the Last Year: Sometimes true  Transportation Needs: No Transportation Needs (06/21/2022)   PRAPARE - Hydrologist (Medical): No    Lack of Transportation (Non-Medical): No  Physical Activity: Not on file  Stress: Not on file  Social Connections: Not on file     Family History: The patient's family history includes Congestive Heart Failure in his father.  ROS:   Please see the history of present illness.    All other systems reviewed and are negative.  EKGs/Labs/Other Studies Reviewed:    The following studies were reviewed today:  July 2023 cardiac MRI shows an EF of 16%, anterior/apical scar.  Significantly reduced RV function. .  July 19, 2022 echo shows EF 30%, RV function normal, mild MR  August 31, 2022 EKG shows sinus rhythm.  Narrow QRS.  Recent Labs: 02/05/2022: TSH 9.011 02/11/2022: ALT 27 02/14/2022: Magnesium 2.4 02/23/2022: Hemoglobin 13.3; Platelets 368 04/11/2022: B Natriuretic Peptide 347.0 09/05/2022: BUN 19; Creatinine, Ser 1.21; Potassium 4.5; Sodium 134  Recent Lipid Panel    Component Value Date/Time   CHOL 242 (H) 02/24/2022 0614   TRIG 235 (H) 02/24/2022 0614   HDL 35 (L) 02/24/2022 0614   CHOLHDL 6.9 02/24/2022 0614   VLDL 47 (H) 02/24/2022 0614   LDLCALC 160 (H) 02/24/2022 1062    Physical Exam:    VS:  BP 116/70   Pulse 96   Ht '6\' 1"'$  (1.854 m)   Wt 270 lb (122.5 kg)   BMI 35.62 kg/m     Wt Readings from Last 3 Encounters:  09/05/22 270 lb (122.5 kg)   08/31/22 272 lb 6.4 oz (123.6 kg)  08/30/22 268 lb 9.6 oz (121.8 kg)     GEN:  Well nourished, well developed in no acute distress CARDIAC: RRR, no murmurs, rubs, gallops PSYCHIATRIC:  Normal affect       ASSESSMENT:    1. Chronic systolic heart failure ([place])   2. Ischemic cardiomyopathy   3. Coronary artery disease involving native coronary artery of native heart, unspecified whether angina present   4. Primary hypertension    PLAN:    In order of problems listed above:  #Chronic systolic heart failure NYHA class II.  Warm and dry on exam.  On good GDMT.  I do think he is a candidate for primary prevention ICD.  I think he is at particularly high risk for sudden cardiac death given the scar present on his cardiac MRI.  The patient has an ischemic CM (EF 30%), NYHA Class III CHF, and CAD.  He is referred by Dr Garen Lah for risk stratification of sudden death and consideration of ICD implantation.  At this time, he meets criteria for ICD implantation for primary prevention of sudden death.  I have had a thorough discussion with the patient reviewing options.  The patient and their family (if available) have had opportunities to ask questions and have them answered. The patient and I have decided together through a shared decision making process to proceed with ICD implant at this time.    Risks, benefits, alternatives to ICD implantation were discussed in detail with the patient today. The patient understands that the risks include but are not limited to bleeding, infection, pneumothorax, perforation, tamponade, vascular damage, renal failure, MI, stroke, death, inappropriate shocks, and lead dislodgement and wishes to proceed.  We will therefore schedule device implantation at the next available time.  Plan for a [employer] VVI ICD.  He will continue on aspirin uninterrupted given his extensive coronary artery disease history.  #Hypertension At goal today.  Recommend  checking blood pressures 1-2 times per week at home and recording the values.  Recommend bringing these recordings to the primary care physician.    Medication Adjustments/Labs and Tests Ordered: Current medicines are reviewed at length with the patient today.  Concerns regarding medicines are outlined above.  Orders Placed This Encounter  Procedures   Basic metabolic panel   No orders of the defined types were placed in this encounter.    Signed, Hilton Cork. Quentin Ore, MD, [facility], [facility] 09/05/2022 7:57 PM    Electrophysiology [facility]

## 2022-09-05 NOTE — Assessment & Plan Note (Signed)
>>  ASSESSMENT AND PLAN FOR TYPE 2 DIABETES MELLITUS WITH HYPERGLYCEMIA (HCC) WRITTEN ON 09/05/2022 11:58 PM BY Kara Dies, NP  He is POCT hemoglobin A1c 7.9 in the office today Advised pt to check the BS regularly, make a log and bring to next appointment.  Advised pt to eat variety of food including fruits, vegetables, whole grains, complex carbohydrates and proteins.  Continue metformin 500 mg twice a day and Farxiga 10 mg daily.

## 2022-09-06 ENCOUNTER — Ambulatory Visit: Payer: Self-pay | Admitting: Nurse Practitioner

## 2022-09-06 ENCOUNTER — Other Ambulatory Visit
Admission: RE | Admit: 2022-09-06 | Discharge: 2022-09-06 | Disposition: A | Discharge status: Home / Self Care | Payer: Medicaid Other | Attending: Cardiology | Admitting: Cardiology

## 2022-09-06 DIAGNOSIS — I5022 Chronic systolic (congestive) heart failure: Secondary | ICD-10-CM

## 2022-09-06 LAB — CBC WITH DIFFERENTIAL/PLATELET
Abs Immature Granulocytes: 0.01 10*3/uL (ref 0.00–0.07)
Basophils Absolute: 0 10*3/uL (ref 0.0–0.1)
Basophils Relative: 1 %
Eosinophils Absolute: 0.2 10*3/uL (ref 0.0–0.5)
Eosinophils Relative: 3 %
HCT: 41.6 % (ref 39.0–52.0)
Hemoglobin: 14.4 g/dL (ref 13.0–17.0)
Immature Granulocytes: 0 %
Lymphocytes Relative: 27 %
Lymphs Abs: 1.6 10*3/uL (ref 0.7–4.0)
MCH: 34.4 pg — ABNORMAL HIGH (ref 26.0–34.0)
MCHC: 34.6 g/dL (ref 30.0–36.0)
MCV: 99.5 fL (ref 80.0–100.0)
Monocytes Absolute: 0.4 10*3/uL (ref 0.1–1.0)
Monocytes Relative: 6 %
Neutro Abs: 3.8 10*3/uL (ref 1.7–7.7)
Neutrophils Relative %: 63 %
Platelets: 159 10*3/uL (ref 150–400)
RBC: 4.18 MIL/uL — ABNORMAL LOW (ref 4.22–5.81)
RDW: 13.6 % (ref 11.5–15.5)
WBC: 5.9 10*3/uL (ref 4.0–10.5)
nRBC: 0 % (ref 0.0–0.2)

## 2022-09-06 NOTE — Assessment & Plan Note (Signed)
Swelling improved from previous visit. Encourage patient to compression stockings and elevate the feet. Continue the current medication regimen

## 2022-09-06 NOTE — Assessment & Plan Note (Signed)
His blood pressure 118/80 in the office today. Encourage patient to consume low-salt and heart healthy diet. Continue the current medication regimen.

## 2022-09-06 NOTE — Telephone Encounter (Signed)
Patient has been made aware to go back by the lab to have CBC redrawn.

## 2022-09-09 ENCOUNTER — Other Ambulatory Visit: Payer: Self-pay | Admitting: Nurse Practitioner

## 2022-09-09 MED FILL — Gabapentin Cap 100 MG: ORAL | 20 days supply | Qty: 60 | Fill #1 | Status: AC

## 2022-09-10 ENCOUNTER — Other Ambulatory Visit: Payer: Self-pay

## 2022-09-10 ENCOUNTER — Other Ambulatory Visit: Payer: Self-pay | Admitting: Nurse Practitioner

## 2022-09-11 ENCOUNTER — Telehealth: Payer: Self-pay | Admitting: Nurse Practitioner

## 2022-09-11 ENCOUNTER — Other Ambulatory Visit: Payer: Self-pay

## 2022-09-11 ENCOUNTER — Other Ambulatory Visit: Payer: Self-pay | Admitting: Nurse Practitioner

## 2022-09-12 ENCOUNTER — Encounter: Payer: Self-pay | Admitting: Nurse Practitioner

## 2022-09-12 ENCOUNTER — Other Ambulatory Visit: Payer: Self-pay

## 2022-09-12 NOTE — Telephone Encounter (Signed)
Pt need a refill XANAX sent to [facility] pharmacy

## 2022-09-13 ENCOUNTER — Other Ambulatory Visit: Payer: Self-pay

## 2022-09-13 ENCOUNTER — Ambulatory Visit: Payer: Medicaid Other | Admitting: Nurse Practitioner

## 2022-09-13 MED ORDER — ALPRAZOLAM 1 MG PO TABS
0.5000 mg | ORAL_TABLET | Freq: Three times a day (TID) | ORAL | 0 refills | Status: DC | PRN
  Filled 2022-09-13: qty 45, 30d supply, fill #0
  Filled 2022-10-15: qty 45, 30d supply, fill #1

## 2022-09-13 NOTE — Telephone Encounter (Signed)
Refill request sent to provider already

## 2022-09-14 ENCOUNTER — Other Ambulatory Visit: Payer: Self-pay

## 2022-09-17 ENCOUNTER — Other Ambulatory Visit: Payer: Self-pay

## 2022-09-18 MED ORDER — SODIUM CHLORIDE 0.9 % IV SOLN
80.0000 mg | INTRAVENOUS | Status: DC
  Filled 2022-09-18 (×3): qty 2

## 2022-09-18 NOTE — Pre-Procedure Instructions (Signed)
Attempted to call patient regarding procedure instructions for tomorrow.  Left voicemail on the following items: Arrival time 0630 Nothing to eat or drink after midnight No meds AM of procedure Responsible person to drive you home and stay with you for 24 hrs Wash with special soap night before and morning of procedure

## 2022-09-19 ENCOUNTER — Other Ambulatory Visit: Payer: Self-pay

## 2022-09-19 ENCOUNTER — Encounter: Payer: Self-pay | Admitting: Cardiology

## 2022-09-19 ENCOUNTER — Ambulatory Visit: Payer: Medicaid Other

## 2022-09-19 ENCOUNTER — Ambulatory Visit
Admission: RE | Admit: 2022-09-19 | Discharge: 2022-09-19 | Disposition: A | Discharge status: Home / Self Care | Payer: Medicaid Other | Attending: Cardiology | Admitting: Cardiology

## 2022-09-19 ENCOUNTER — Encounter
Admission: RE | Disposition: A | Discharge status: Home / Self Care | Payer: Self-pay | Source: Home / Self Care | Attending: Cardiology

## 2022-09-19 DIAGNOSIS — Z5941 Food insecurity: Secondary | ICD-10-CM | POA: Insufficient documentation

## 2022-09-19 DIAGNOSIS — Z6835 Body mass index (BMI) 35.0-35.9, adult: Secondary | ICD-10-CM | POA: Insufficient documentation

## 2022-09-19 DIAGNOSIS — E782 Mixed hyperlipidemia: Secondary | ICD-10-CM | POA: Diagnosis not present

## 2022-09-19 DIAGNOSIS — E669 Obesity, unspecified: Secondary | ICD-10-CM | POA: Diagnosis not present

## 2022-09-19 DIAGNOSIS — I5042 Chronic combined systolic (congestive) and diastolic (congestive) heart failure: Secondary | ICD-10-CM | POA: Diagnosis present

## 2022-09-19 DIAGNOSIS — I110 Hypertensive heart disease with heart failure: Secondary | ICD-10-CM | POA: Diagnosis not present

## 2022-09-19 DIAGNOSIS — I2510 Atherosclerotic heart disease of native coronary artery without angina pectoris: Secondary | ICD-10-CM | POA: Diagnosis not present

## 2022-09-19 DIAGNOSIS — Z8249 Family history of ischemic heart disease and other diseases of the circulatory system: Secondary | ICD-10-CM | POA: Diagnosis not present

## 2022-09-19 DIAGNOSIS — Z5986 Financial insecurity: Secondary | ICD-10-CM | POA: Insufficient documentation

## 2022-09-19 DIAGNOSIS — Z87891 Personal history of nicotine dependence: Secondary | ICD-10-CM | POA: Diagnosis not present

## 2022-09-19 DIAGNOSIS — I255 Ischemic cardiomyopathy: Secondary | ICD-10-CM | POA: Diagnosis not present

## 2022-09-19 DIAGNOSIS — Z79899 Other long term (current) drug therapy: Secondary | ICD-10-CM | POA: Insufficient documentation

## 2022-09-19 DIAGNOSIS — I5022 Chronic systolic (congestive) heart failure: Secondary | ICD-10-CM | POA: Diagnosis not present

## 2022-09-19 DIAGNOSIS — I509 Heart failure, unspecified: Secondary | ICD-10-CM

## 2022-09-19 DIAGNOSIS — Z950 Presence of cardiac pacemaker: Secondary | ICD-10-CM | POA: Diagnosis not present

## 2022-09-19 HISTORY — DX: Polyneuropathy, unspecified: G62.9

## 2022-09-19 HISTORY — DX: Gastro-esophageal reflux disease without esophagitis: K21.9

## 2022-09-19 HISTORY — PX: ICD IMPLANT: EP1208

## 2022-09-19 LAB — GLUCOSE, CAPILLARY: Glucose-Capillary: 293 mg/dL — ABNORMAL HIGH (ref 70–99)

## 2022-09-19 SURGERY — ICD IMPLANT
Anesthesia: Moderate Sedation

## 2022-09-19 MED ORDER — SODIUM CHLORIDE 0.9 % IV SOLN
INTRAVENOUS | Status: DC | PRN
  Administered 2022-09-19: 80 mg

## 2022-09-19 MED ORDER — CEFAZOLIN IN SODIUM CHLORIDE 3-0.9 GM/100ML-% IV SOLN
3.0000 g | INTRAVENOUS | Status: DC
  Filled 2022-09-19: qty 100

## 2022-09-19 MED ORDER — POVIDONE-IODINE 10 % EX SWAB: 2.0000 | Freq: Once | CUTANEOUS | Status: DC

## 2022-09-19 MED ORDER — FENTANYL CITRATE (PF) 100 MCG/2ML IJ SOLN
INTRAMUSCULAR | Status: AC
  Filled 2022-09-19: qty 2

## 2022-09-19 MED ORDER — SODIUM CHLORIDE 0.9 % IV SOLN: INTRAVENOUS | Status: DC

## 2022-09-19 MED ORDER — LIDOCAINE HCL 1 % IJ SOLN
INTRAMUSCULAR | Status: AC
  Filled 2022-09-19: qty 20

## 2022-09-19 MED ORDER — FENTANYL CITRATE (PF) 100 MCG/2ML IJ SOLN
INTRAMUSCULAR | Status: DC | PRN
  Administered 2022-09-19 (×3): 12.5 ug via INTRAVENOUS

## 2022-09-19 MED ORDER — HEPARIN (PORCINE) IN NACL 1000-0.9 UT/500ML-% IV SOLN
INTRAVENOUS | Status: AC
  Filled 2022-09-19: qty 1000

## 2022-09-19 MED ORDER — MIDAZOLAM HCL 2 MG/2ML IJ SOLN
INTRAMUSCULAR | Status: AC
  Filled 2022-09-19: qty 2

## 2022-09-19 MED ORDER — LIDOCAINE HCL 1 % IJ SOLN
INTRAMUSCULAR | Status: AC
  Filled 2022-09-19: qty 40

## 2022-09-19 MED ORDER — CEFAZOLIN SODIUM-DEXTROSE 1-4 GM/50ML-% IV SOLN
INTRAVENOUS | Status: DC | PRN
  Administered 2022-09-19: 3 g via INTRAVENOUS

## 2022-09-19 MED ORDER — ONDANSETRON HCL 4 MG/2ML IJ SOLN: 4.0000 mg | Freq: Four times a day (QID) | INTRAMUSCULAR | Status: DC | PRN

## 2022-09-19 MED ORDER — MIDAZOLAM HCL 2 MG/2ML IJ SOLN
INTRAMUSCULAR | Status: DC | PRN
  Administered 2022-09-19 (×3): .5 mg via INTRAVENOUS

## 2022-09-19 MED ORDER — LIDOCAINE HCL (PF) 1 % IJ SOLN
INTRAMUSCULAR | Status: DC | PRN
  Administered 2022-09-19: 30 mL

## 2022-09-19 MED ORDER — ACETAMINOPHEN 325 MG PO TABS
ORAL_TABLET | ORAL | Status: AC
  Filled 2022-09-19: qty 2

## 2022-09-19 MED ORDER — ACETAMINOPHEN 325 MG PO TABS
325.0000 mg | ORAL_TABLET | ORAL | Status: DC | PRN
  Administered 2022-09-19: 650 mg via ORAL

## 2022-09-19 SURGICAL SUPPLY — 14 items
CABLE SURG 12 DISP A/V CHANNEL (MISCELLANEOUS) IMPLANT
DEVICE DSSCT PLSMBLD 3.0S LGHT (MISCELLANEOUS) IMPLANT
ICD VIGILANT VR D232 (Pacemaker) IMPLANT
KIT SYRINGE INJ CVI SPIKEX1 (MISCELLANEOUS) IMPLANT
LEAD RELIANCE 0673 IMPLANT
PAD ELECT DEFIB RADIOL ZOLL (MISCELLANEOUS) IMPLANT
PLASMABLADE 3.0S W/LIGHT (MISCELLANEOUS) ×1 IMPLANT
POUCH AIGIS-R ANTIBACT ICD (Mesh General) ×1 IMPLANT
POUCH AIGIS-R ANTIBACT ICD LRG (Mesh General) IMPLANT
SHEATH 8FR PRELUDE SNAP 13 (SHEATH) IMPLANT
SUT DVC VLOC 3-0 CL 6 P-12 (SUTURE) IMPLANT
SUT ETHIBOND CT1 BRD #0 30IN (SUTURE) IMPLANT
SUTURE VLOC 90 2-0 VIO GS21 (SUTURE) IMPLANT
TRAY PACEMAKER INSERTION (PACKS) ×1 IMPLANT

## 2022-09-19 NOTE — Discharge Instructions (Addendum)
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 After 7 days, you may progress as below.  You should remove your sling 24 hours after your procedure, unless otherwise instructed by your provider.     Wednesday September 26, 2022  Thursday September 27, 2022 Friday September 28, 2022 Saturday September 29, 2022   Do not lift, push, pull, or carry anything over 10 pounds with the affected arm until 6 weeks (Wednesday October 31, 2022 ) after your procedure.   You may drive AFTER your wound check, unless you have been told otherwise by your provider.   Ask your healthcare provider when you can go back to work   INCISION/Dressing If you are on a blood thinner such as Coumadin, Xarelto, Eliquis, Plavix, or Pradaxa please confirm with your provider when this should be resumed.   If large square, outer bandage is left in place, this can be removed after 24 hours from your procedure. Do not remove steri-strips or glue as below.   Monitor your defibrillator site for redness, swelling, and drainage. Call the device clinic at [phone] if you experience these symptoms or fever/chills.  If your incision is sealed with Steri-strips or staples, you may shower 7 days after your procedure or when told by your provider. Do not remove the steri-strips or let the shower hit directly on your site. You may wash around your site with soap and water.    If you were discharged in a sling, please do not wear this during the day more than 48 hours after your surgery unless otherwise instructed. This may increase the risk of stiffness and soreness in your shoulder.   Avoid lotions, ointments, or perfumes over your incision until it is well-healed.  You may use a hot tub or a pool AFTER your wound check appointment if the incision is completely closed.  Your ICD is designed to protect you from  life threatening heart rhythms. Because of this, you may receive a shock.   1 shock with no symptoms:  Call the office during business hours. 1 shock with symptoms (chest pain, chest pressure, dizziness, lightheadedness, shortness of breath, overall feeling unwell):  Call 911. If you experience 2 or more shocks in 24 hours:  Call 911. If you receive a shock, you should not drive for 6 months per the [place] DMV IF you receive appropriate therapy from your ICD.   ICD Alerts:  Some alerts are vibratory and others beep. These are NOT emergencies. Please call our office to let us know. If this occurs at night or on weekends, it can wait until the next business day. Send a remote transmission.  If your device is capable of reading fluid status (for heart failure), you will be offered monthly monitoring to review this with you.   DEVICE MANAGEMENT Remote monitoring is used to monitor your ICD from home. This monitoring is scheduled every 91 days by our office. It allows [place] to keep an eye on the functioning of your device to ensure it is working properly. You will routinely see your Electrophysiologist annually (more often if necessary).   You should receive your ID card for your new device in 4-8 weeks. Keep this card with you at all times once received. Consider wearing a medical alert bracelet or necklace.  Your ICD  may be MRI compatible. This will be discussed at your  next office visit/wound check.  You should avoid contact with strong electric or magnetic fields.   Do not use amateur (ham) radio equipment or electric (arc) welding torches. MP3 player headphones with magnets should not be used. Some devices are safe to use if held at least 12 inches (30 cm) from your defibrillator. These include power tools, lawn mowers, and speakers. If you are unsure if something is safe to use, ask your health care provider.  When using your cell phone, hold it to the ear that is on the opposite side from the  defibrillator. Do not leave your cell phone in a pocket over the defibrillator.  You may safely use electric blankets, heating pads, computers, and microwave ovens.  Call the office right away if: You have chest pain. You feel more than one shock. You feel more short of breath than you have felt before. You feel more light-headed than you have felt before. Your incision starts to open up.  This information is not intended to replace advice given to you by your health care provider. Make sure you discuss any questions you have with your health care provider.

## 2022-09-19 NOTE — Interval H&P Note (Signed)
History and Physical Interval Note:  09/19/2022 7:05 AM  Lawrence Murray  has presented today for surgery, with the diagnosis of ICD Implant   Dual   [employer]  Heart Failure.  The various methods of treatment have been discussed with the patient and family. After consideration of risks, benefits and other options for treatment, the patient has consented to  Procedure(s): ICD IMPLANT (N/A) as a surgical intervention.  The patient's history has been reviewed, patient examined, no change in status, stable for surgery.  I have reviewed the patient's chart and labs.  Questions were answered to the patient's satisfaction.     Lawrence Murray

## 2022-09-19 NOTE — Discharge Summary (Signed)
Discharge Summary    Patient ID: Lawrence Murray  MRN: [id number], DOB/AGE: 11-25-1965 57 y.o.  Admit Date: 09/19/2022 Discharge Date: 09/19/2022  Primary Care Provider: Theresia Lo, NP Primary Cardiologist: Dr. Garen Lah, MD Primary Electrophysiologist: Dr. Quentin Ore, MD Advanced Heart Failure: Dr. Daniel Nones  Discharge Diagnoses    Active Problems:   Coronary artery disease   Chronic combined systolic and diastolic congestive heart failure ([place])   Ischemic cardiomyopathy   Allergies No Known Allergies   History of Present Illness     57 year old male with history of CAD, HFrEF secondary to ICM, DM2, HTN, HLD, obesity, and prior tobacco use for 20+ years who presented to [facility] for ICD implantation.  He was admitted to the hospital in 01/2022 acute HFrEF.  He had not seen a physician in many years.  Echo during that admission showed an EF of 5%, global hypokinesis, RV not well-visualized, though likely enlarged, large pleural effusion, and mild mitral regurgitation.  He underwent successful IV diuresis.  R/LHC showed severe, three-vessel CAD including chronic total/subtotal occlusions of large D1 and OM1 branches, mid LAD, and ostial RPDA.  There was also moderate to severe disease involving the proximal and distal LCx as well as the distal RCA.  Moderately elevated left heart, right heart, and pulmonary artery pressures with a low normal output/index.  Given these findings, diuresis was escalated with recommendation to titrate directed medical therapy.  Cardiac MRI showed severely reduced BiV function with an LVEF of 60% and RVEF of 19%, transmural subendocardial scar anterior wall and apex (LAD territory) indicating nonviability, 25 to 50% subendocardial LGE/scar in the LV lateral wall with the LV lateral wall and inferior wall appearing viable (RCA/LCx territory).  Findings were consistent with ischemic cardiomyopathy with viable LV lateral and inferior walls.  Coronary anatomy  documented to not be amenable to revascularization.  Repeat echo in 06/2022 showed an EF of 30%, global hypokinesis, mildly dilated LV internal cavity size, grade 2 diastolic dysfunction, normal RV systolic function and ventricular cavity size, normal PASP, mildly dilated left atrium mitral regurgitation, and an estimated right atrial pressure of 3 mmHg.  Given persistent cardiomyopathy with an EF less than 35%, despite maximally tolerated GDMT, he was referred to EP Dr. Quentin Ore with recommendation to proceed with ICD for primary prevention, particularly in light of the scar present on his cardiac MRI.  Hospital Course     Consultants: None   He presented to [facility] on [facility]/19/[facility]22 and underwent successful [place] ICD primary prevention without complication.  Follow-up chest x-ray has been reviewed by Dr. Quentin Ore and noted to be stable.  There were no changes in medications with recommendation to follow-up as previously scheduled as outlined below.  Range of motion instructions were discussed with the patient in detail.  The patient has been seen by Dr. Quentin Ore and felt to be stable for discharge today. All follow up appointments have been made. Discharge medications are listed below. Prescriptions have been reviewed with the patient and sent in to their pharmacy, if indicated.  _____________  Discharge Vitals Blood pressure 101/61, pulse 71, temperature 98.1 F (36.7 C), temperature source Oral, resp. rate 12, height '6\' 1"'$  (1.854 m), weight 74.8 kg, SpO2 98 %.  Filed Weights   09/19/22 0700  Weight: 74.8 kg   Physical Exam   GEN: No acute distress.   Neck: No JVD. Cardiac: RRR, no murmurs, rubs, or gallops.  Respiratory: Clear to auscultation bilaterally.  GI: Soft, nontender, non-distended.   MS: No edema;  No deformity. Neuro:  Alert and oriented x 3; Nonfocal.  Psych: Normal affect.  Labs & Radiologic Studies    CBC No results for input(s): "WBC", "NEUTROABS", "HGB", "HCT",  "MCV", "PLT" in the last 72 hours. Basic Metabolic Panel No results for input(s): "NA", "K", "CL", "CO2", "GLUCOSE", "BUN", "CREATININE", "CALCIUM", "MG", "PHOS" in the last 72 hours. Liver Function Tests No results for input(s): "AST", "ALT", "ALKPHOS", "BILITOT", "PROT", "ALBUMIN" in the last 72 hours. No results for input(s): "LIPASE", "AMYLASE" in the last 72 hours. Cardiac Enzymes No results for input(s): "CKTOTAL", "CKMB", "CKMBINDEX", "TROPONINI" in the last 72 hours. BNP Invalid input(s): "POCBNP" D-Dimer No results for input(s): "DDIMER" in the last 72 hours. Hemoglobin A1C No results for input(s): "HGBA1C" in the last 72 hours. Fasting Lipid Panel No results for input(s): "CHOL", "HDL", "LDLCALC", "TRIG", "CHOLHDL", "LDLDIRECT" in the last 72 hours. Thyroid Function Tests No results for input(s): "TSH", "T4TOTAL", "T3FREE", "THYROIDAB" in the last 72 hours.  Invalid input(s): "FREET3" _____________  DG Chest 2 View  Result Date: October 14, 2022 CLINICAL DATA:  Status post pacemaker placement. EXAM: CHEST - 2 VIEW COMPARISON:  02/25/2022 FINDINGS: Permanent pacemaker is now seen with single lead overlying the right ventricle. Heart size is prominent but likely exaggerated by low lung volumes. Both lungs are clear. No evidence of pneumothorax or pleural effusion. IMPRESSION: New permanent pacemaker in appropriate position.  No active disease. Electronically Signed   By: Marlaine Hind M.D.   On: October 14, 2022 12:07   EP PPM/ICD IMPLANT  Result Date: Oct 14, 2022  CONCLUSIONS:  1. Chronic systolic heart failure  2. Successful ICD implantation with a [employer] ICD implanted for primary prevention of sudden death.  3.  No early apparent complications.    Diagnostic Studies/Procedures   ICD implant October 14, 2022: PREPROCEDURE DIAGNOSES:  1. Chronic systolic heart failure  POSTPROCEDURE DIAGNOSES:  Same as preprocedure.  PROCEDURES:  1. ICD implantation.  INTRODUCTION: Lawrence Murray is a 57 y.o. male with chronic systolic heart failure, AX65%. At this time, he meets criteria for ICD implantation for primary prevention of sudden death. The patient has been treated with an optimal medical regimen but continues to have a depressed ejection fraction and NYHA Class III CHF symptoms. The patient therefore presents today for ICD implantation.   DESCRIPTION OF PROCEDURE: Informed written consent was obtained and the patient was brought to the electrophysiology lab in the fasting state. The patient was adequately sedated with intravenous Versed, and fentanyl as outlined in the nursing report. The patient's left chest was prepped and draped in the usual sterile fashion by the EP lab staff. The skin overlying the left deltopectoral region was infiltrated with lidocaine for local analgesia. An incision was created over the left deltopectoral region. A left subcutaneous defibrillator pocket was fashioned using a combination of sharp and blunt dissection. Electrocautery was used to assure hemostasis.   Lead Placement: The left axillary vein was cannulated using ultrasound guidance. Through the left axillary vein, a RV single coil ICD lead was advanced to the RV mid septum (model [employer] 4-Front, serial [id number]) where it displayed excelling pacing (0.7 V at 0.43m) and sensing thresholds (22 mV) with an acceptable impedance (702 ohms). The lead was secured to the pectoral fascia. The pocket was irrigated with copious vancomycin solution. The leads were then connected to a pulse generator (model Vigilant EL ICD VR, serial [id number]. The pulse generator was positioned in the pocket within an antibiotic envelope. The pocket was closed in layers of  absorbable suture. EBL < 63m. Steri-strips and a sterile dressing were applied.  During this procedure the patient is administered a total of Versed 1.5 mg and Fentanyl 37.5 mcg to achieve and maintain moderate conscious sedation. The  patient's heart rate, blood pressure, and oxygen saturation are monitored continuously during the procedure. The period of conscious sedation is 33 minutes, of which I was present face-to-face 100% of this time.   CONCLUSIONS:  1. Chronic systolic heart failure 2. Successful ICD implantation with a BPac[employer] MutualICD implanted for primary prevention of sudden death.  3. No early apparent complications.    Estimated blood loss <50 mL.  _____________  Disposition   Pt is being discharged home today in good condition.  Follow-up Plans & Appointments     Follow-up Information     [facility] Follow up on 10/03/2022.   Why: Appointment 4 PM Contact information: [phone]       SHebert Soho DO Follow up on 10/17/2022.   Specialty: Cardiology Why: Appointment time 1:20 Contact information: [phone]         LVickie Epley MD Follow up on 12/19/2022.   Specialties: Cardiology, Radiology Why: Appointment time 3:20 Contact information: [address] [phone]               Discharge Instructions     Diet - low sodium heart healthy   Complete by: As directed    Increase activity slowly   Complete by: As directed        Discharge Medications   Allergies as of 09/19/2022   No Known Allergies      Medication List     TAKE these medications    Accu-Chek Guide Me w/Device Kit Use as directed daily   Accu-Chek Guide test strip Generic drug: glucose blood Use as directed once daily   Accu-Chek Softclix Lancets lancets Use as directed to check blood glucose daily   acetaminophen 500 MG tablet Commonly known as: TYLENOL Take 2 tablets (1,000 mg total) by mouth every 6 (six) hours as needed for mild pain, moderate pain, fever or headache.   ALPRAZolam 1 MG  tablet Commonly known as: XANAX Take 1/2 tablet (0.5 mg total) by mouth 3 (three) times daily as needed for anxiety. (Take 1/2  tablet (0.5 mg total) by mouth 3 (three) times daily as needed for anxiety.)   aspirin EC 81 MG tablet Take 1 tablet (81 mg total) by mouth daily. Swallow whole.   atorvastatin 80 MG tablet Commonly known as: LIPITOR Take 1 tablet (80 mg total) by mouth daily.   dapagliflozin propanediol 10 MG Tabs tablet Commonly known as: FARXIGA Take one tablet by mouth daily   digoxin 0.125 MG tablet Commonly known as: LANOXIN Take 1 tablet (0.125 mg total) by mouth daily.   furosemide 40 MG tablet Commonly known as: LASIX Take 1.5 tablets (60 mg total) by mouth daily.   gabapentin 100 MG capsule Commonly known as: NEURONTIN Take 1 capsule (100 mg total) by mouth 3 (three) times daily.   metFORMIN 500 MG tablet Commonly known as: GLUCOPHAGE Take 1 tablet (500 mg total) by mouth 2 (two) times daily.   metoprolol succinate 25 MG 24 hr tablet Commonly known as: TOPROL-XL Take 0.5 tablets (12.5 mg total) by mouth daily.   pantoprazole 40 MG tablet Commonly known as: PROTONIX Take  1 tablet (40 mg total) by mouth daily.   potassium chloride SA 20 MEQ tablet Commonly known as: KLOR-CON M Take 1 tablet (20 mEq total) by mouth daily.   spironolactone 25 MG tablet Commonly known as: ALDACTONE Take 1/2 tablet (12.5 mg total) by mouth daily. (Take 0.5 tablets (12.5 mg total) by mouth daily.)   Stool Softener 250 MG capsule Generic drug: docusate sodium Take 250 mg by mouth daily.        This was not an ACS admission.  Aspirin prescribed at discharge?  Yes High Intensity Statin Prescribed? (Lipitor 40-'80mg'$  or Crestor 20-'40mg'$ ): Yes Beta Blocker Prescribed? No: SSS, not indicated For EF <40%, was ACEI/ARB Prescribed? No: Hypotension ADP Receptor Inhibitor Prescribed? (i.e. Plavix etc.-Includes Medically Managed Patients): No: not indicated For EF <40%,  Aldosterone Inhibitor Prescribed? Yes Was EF assessed during THIS hospitalization? No: not indicated Was Cardiac Rehab II ordered? (Included Medically managed Patients): No: not indicated. Patient has already completed.    Outstanding Labs/Studies   None.  Duration of Discharge Encounter   Greater than 30 minutes including physician time.  Signed, Rise Mu, PA-C [facility] Pager: [phone] 09/19/2022, 1:03 PM

## 2022-09-19 NOTE — Interval H&P Note (Signed)
History and Physical Interval Note:  09/19/2022 7:04 AM  Lawrence Murray  has presented today for surgery, with the diagnosis of ICD Implant   Dual   [employer]  Heart Failure.  The various methods of treatment have been discussed with the patient and family. After consideration of risks, benefits and other options for treatment, the patient has consented to  Procedure(s): ICD IMPLANT (N/A) as a surgical intervention.  The patient's history has been reviewed, patient examined, no change in status, stable for surgery.  I have reviewed the patient's chart and labs.  Questions were answered to the patient's satisfaction.     Jerimah Witucki T Chiron Campione

## 2022-09-24 ENCOUNTER — Telehealth: Payer: Self-pay | Admitting: Nurse Practitioner

## 2022-09-24 ENCOUNTER — Telehealth: Payer: Self-pay | Admitting: Cardiology

## 2022-09-24 NOTE — Telephone Encounter (Signed)
Spoke with patient.  Reviewed his concerns with 2/21 appt. Reassured him that location is in [place].  We went over, extensively, wound care, shower restrictions and discussed driving.  He has not been restricted from driving due to VT/VF or therapy event, just post op restriction. Patient states his sister is dying in the hospital and he would like to go see her but he is his only means of transportation.  I discussed with Dr. Quentin Ore.  As long as patient is aware of slight increased risk with lead/arm, then okay to begin driving.  I have LM for patient to inform of Dr. Mardene Speak approval with risk understanding.

## 2022-09-24 NOTE — Telephone Encounter (Signed)
Patient wanting to know if he can take extra Neurontin to help with the pain at his wound site?  Post implant on 09/19/22.  We discussed Tylenol dosing of max '3000mg'$  per 24 hours. (Can take up to '1000mg'$  tid).  Patient says this isn't enough pain control and feels like the Neurontin helps him.  He called his primary who states if we are okay with taking extra Neurontin post procedure then we should tell him how much.  Currently his rx is for Neurontin '100mg'$  tid.   Will defer to Dr. Quentin Ore. Patient states we can send him a mychart message back with recommendation.

## 2022-09-24 NOTE — Telephone Encounter (Signed)
Pt advissed nothing scheduled for 2/9 - has f/u in 4 months

## 2022-09-24 NOTE — Telephone Encounter (Signed)
Pt called stating that he is having pain at his incision site. Please call.

## 2022-09-24 NOTE — Telephone Encounter (Signed)
Patient is confused and is not sure if he is suppose to come into the office on 09/28/2022. He does have a 60mfollow up in May, does he need to come in sooner?

## 2022-09-28 ENCOUNTER — Ambulatory Visit: Payer: Medicaid Other | Admitting: Nurse Practitioner

## 2022-10-02 ENCOUNTER — Encounter: Payer: Medicaid Other | Admitting: Family

## 2022-10-03 ENCOUNTER — Ambulatory Visit: Payer: Medicaid Other

## 2022-10-09 ENCOUNTER — Encounter: Payer: Medicaid Other | Admitting: Internal Medicine

## 2022-10-09 ENCOUNTER — Other Ambulatory Visit: Payer: Self-pay

## 2022-10-10 ENCOUNTER — Ambulatory Visit: Payer: Medicaid Other | Admitting: Cardiology

## 2022-10-10 ENCOUNTER — Encounter: Payer: Self-pay | Admitting: Cardiology

## 2022-10-10 VITALS — BP 96/66 | HR 67 | Ht 73.0 in | Wt 270.0 lb

## 2022-10-10 DIAGNOSIS — I255 Ischemic cardiomyopathy: Secondary | ICD-10-CM

## 2022-10-10 DIAGNOSIS — Z95810 Presence of automatic (implantable) cardiac defibrillator: Secondary | ICD-10-CM

## 2022-10-10 DIAGNOSIS — I5022 Chronic systolic (congestive) heart failure: Secondary | ICD-10-CM | POA: Diagnosis not present

## 2022-10-10 NOTE — Progress Notes (Signed)
Electrophysiology Office Follow up Visit Note:    Date:  10/10/2022   ID:  Lawrence Murray, DOB Jun 11, 1966, MRN [id number]  PCP:  Default, Provider, MD  [facility] Cardiologist:  Kate Sable, MD  [facility] Electrophysiologist:  Vickie Epley, MD    Interval History:    Lawrence Murray is a 57 y.o. male who presents for a follow up visit.  He had an ICD implanted September 19, 2022. He has done well after implant with improving pain level in the left upper extremity.  No problems with the incision. The patient does tell me that he took several showers early after device implant with some form of covering over the incision.     Past medical, surgical, social and family history were reviewed.  Current Medications: Current Meds  Medication Sig   Accu-Chek Softclix Lancets lancets Use as directed to check blood glucose daily   acetaminophen (TYLENOL) 500 MG tablet Take 2 tablets (1,000 mg total) by mouth every 6 (six) hours as needed for mild pain, moderate pain, fever or headache.   ALPRAZolam (XANAX) 1 MG tablet Take 1/2  tablet (0.5 mg total) by mouth 3 (three) times daily as needed for anxiety.   aspirin EC 81 MG tablet Take 1 tablet (81 mg total) by mouth daily. Swallow whole.   atorvastatin (LIPITOR) 80 MG tablet Take 1 tablet (80 mg total) by mouth daily.   Blood Glucose Monitoring Suppl (ACCU-CHEK GUIDE ME) w/Device KIT Use as directed daily   dapagliflozin propanediol (FARXIGA) 10 MG TABS tablet Take one tablet by mouth daily   digoxin (LANOXIN) 0.125 MG tablet Take 1 tablet (0.125 mg total) by mouth daily.   docusate sodium (STOOL SOFTENER) 250 MG capsule Take 250 mg by mouth daily.   furosemide (LASIX) 40 MG tablet Take 1.5 tablets (60 mg total) by mouth daily.   gabapentin (NEURONTIN) 100 MG capsule Take 1 capsule (100 mg total) by mouth 3 (three) times daily.   glucose blood test strip Use as directed once daily   metFORMIN (GLUCOPHAGE) 500 MG tablet Take 1  tablet (500 mg total) by mouth 2 (two) times daily.   metoprolol succinate (TOPROL-XL) 25 MG 24 hr tablet Take 0.5 tablets (12.5 mg total) by mouth daily.   pantoprazole (PROTONIX) 40 MG tablet Take 1 tablet (40 mg total) by mouth daily.   potassium chloride SA (KLOR-CON M) 20 MEQ tablet Take 1 tablet (20 mEq total) by mouth daily.   spironolactone (ALDACTONE) 25 MG tablet Take 0.5 tablets (12.5 mg total) by mouth daily.     Allergies:   Patient has no known allergies.   ROS:   Please see the history of present illness.    All other systems reviewed and are negative.  EKGs/Labs/Other Studies Reviewed:    The following studies were reviewed today:  October 10, 2022 in clinic device interrogation personally reviewed Battery and lead parameter stable. No high-voltage therapies have been delivered thus far.     Physical Exam:    Today's Vitals   10/10/22 0914  BP: 96/66  Pulse: 67  SpO2: 97%  Weight: 270 lb (122.5 kg)  Height: 6' 1"$  (1.854 m)   Body mass index is 35.62 kg/m.    Wt Readings from Last 3 Encounters:  10/10/22 270 lb (122.5 kg)  09/19/22 165 lb (74.8 kg)  09/05/22 270 lb (122.5 kg)     GEN:  Well nourished, well developed in no acute distress.  Obese CARDIAC: RRR, no murmurs, rubs,  gallops.  ICD pocket well-healed      ASSESSMENT:    1. Chronic systolic heart failure ([place])   2. Ischemic cardiomyopathy   3. ICD (implantable cardioverter-defibrillator) in place    PLAN:    In order of problems listed above:  #Chronic systolic heart failure NYHA class II.  Warm and dry on exam.  Continue GDMT.  #ICD in situ Device functioning appropriately.  Wound is healing nicely.     Medication Adjustments/Labs and Tests Ordered: Current medicines are reviewed at length with the patient today.  Concerns regarding medicines are outlined above.  No orders of the defined types were placed in this encounter.  No orders of the defined types were placed in  this encounter.    Signed, Lars Mage, MD, [facility], [facility] 10/10/2022 9:17 AM    Electrophysiology [facility]

## 2022-10-10 NOTE — Patient Instructions (Addendum)
  After Your ICD (Implantable Cardiac Defibrillator)   Monitor your defibrillator site for redness, swelling, and drainage. Call the device clinic at [phone] if you experience these symptoms or fever/chills.  Your incision was closed with Steri-strips or staples:  You may shower 7 days after your procedure and wash your incision with soap and water. Avoid lotions, ointments, or perfumes over your incision until it is well-healed.  You may use a hot tub or a pool after your wound check appointment if the incision is completely closed.  Do not lift, push or pull greater than 10 pounds with the affected arm until 6 weeks after your procedure. There are no other restrictions in arm movement after your wound check appointment.  Your ICD is designed to protect you from life threatening heart rhythms. Because of this, you may receive a shock.   1 shock with no symptoms:  Call the office during business hours. 1 shock with symptoms (chest pain, chest pressure, dizziness, lightheadedness, shortness of breath, overall feeling unwell):  Call 911. If you experience 2 or more shocks in 24 hours:  Call 911. If you receive a shock, you should not drive.  [place] DMV - no driving for 6 months if you receive appropriate therapy from your ICD.   ICD Alerts:  Some alerts are vibratory and others beep. These are NOT emergencies. Please call our office to let us know. If this occurs at night or on weekends, it can wait until the next business day. Send a remote transmission.  Remote monitoring is used to monitor your ICD from home. This monitoring is scheduled every 91 days by our office. It allows [place] to keep an eye on the functioning of your device to ensure it is working properly. You will routinely see your Electrophysiologist annually (more often if necessary).

## 2022-10-15 ENCOUNTER — Other Ambulatory Visit: Payer: Self-pay

## 2022-10-17 ENCOUNTER — Other Ambulatory Visit
Admission: RE | Admit: 2022-10-17 | Discharge: 2022-10-17 | Disposition: A | Discharge status: Home / Self Care | Payer: Medicaid Other | Source: Ambulatory Visit | Attending: Family | Admitting: Family

## 2022-10-17 ENCOUNTER — Other Ambulatory Visit: Payer: Self-pay

## 2022-10-17 ENCOUNTER — Encounter: Payer: Self-pay | Admitting: Cardiology

## 2022-10-17 ENCOUNTER — Ambulatory Visit (HOSPITAL_BASED_OUTPATIENT_CLINIC_OR_DEPARTMENT_OTHER): Payer: Medicaid Other | Admitting: Cardiology

## 2022-10-17 VITALS — BP 115/75 | HR 85 | Wt 272.6 lb

## 2022-10-17 DIAGNOSIS — I5022 Chronic systolic (congestive) heart failure: Secondary | ICD-10-CM | POA: Insufficient documentation

## 2022-10-17 DIAGNOSIS — E669 Obesity, unspecified: Secondary | ICD-10-CM

## 2022-10-17 DIAGNOSIS — E119 Type 2 diabetes mellitus without complications: Secondary | ICD-10-CM

## 2022-10-17 DIAGNOSIS — I255 Ischemic cardiomyopathy: Secondary | ICD-10-CM | POA: Diagnosis not present

## 2022-10-17 DIAGNOSIS — E785 Hyperlipidemia, unspecified: Secondary | ICD-10-CM | POA: Diagnosis not present

## 2022-10-17 LAB — BRAIN NATRIURETIC PEPTIDE: B Natriuretic Peptide: 66.6 pg/mL (ref 0.0–100.0)

## 2022-10-17 LAB — LIPID PANEL
Cholesterol: 195 mg/dL (ref 0–200)
HDL: 33 mg/dL — ABNORMAL LOW (ref >40)
LDL Cholesterol: UNDETERMINED mg/dL (ref 0–99)
Total CHOL/HDL Ratio: 5.9 RATIO
Triglycerides: 439 mg/dL — ABNORMAL HIGH (ref <150)
VLDL: UNDETERMINED mg/dL (ref 0–40)

## 2022-10-17 LAB — BASIC METABOLIC PANEL
Anion gap: 10 (ref 5–15)
BUN: 28 mg/dL — ABNORMAL HIGH (ref 6–20)
CO2: 24 mmol/L (ref 22–32)
Calcium: 9.6 mg/dL (ref 8.9–10.3)
Chloride: 100 mmol/L (ref 98–111)
Creatinine, Ser: 1.29 mg/dL — ABNORMAL HIGH (ref 0.61–1.24)
GFR, Estimated: >60 mL/min (ref >60)
Glucose, Bld: 246 mg/dL — ABNORMAL HIGH (ref 70–99)
Potassium: 4.3 mmol/L (ref 3.5–5.1)
Sodium: 134 mmol/L — ABNORMAL LOW (ref 135–145)

## 2022-10-17 MED ORDER — METOPROLOL SUCCINATE ER 25 MG PO TB24
25.0000 mg | ORAL_TABLET | Freq: Every day | ORAL | 3 refills | Status: DC
  Filled 2022-10-17: qty 30, 30d supply, fill #0
  Filled 2022-11-12: qty 30, 30d supply, fill #1
  Filled 2022-12-10: qty 30, 30d supply, fill #2
  Filled 2023-01-11: qty 30, 30d supply, fill #3

## 2022-10-17 MED ORDER — LOSARTAN POTASSIUM 25 MG PO TABS
12.5000 mg | ORAL_TABLET | Freq: Every day | ORAL | 3 refills | Status: DC
  Filled 2022-10-17: qty 30, 60d supply, fill #0
  Filled 2022-12-10: qty 30, 60d supply, fill #1

## 2022-10-17 NOTE — Progress Notes (Signed)
ADVANCED HEART FAILURE CLINIC NOTE  Referring Physician: Kate Sable, MD  Primary Care: Default, Provider, MD Primary Cardiologist:  HPI: Lawrence Murray is a 57 y.o. male with HFrEF 2/2 ischemic cardiomyopathy s/p ICD (09/19/22), CAD, obesity, hx of tobacco use. [name]' cardiac history dates back to June 2023 when he was admitted for acute heart failure exacerbation.  He had an echocardiogram at that time demonstrating an LVEF of 30 to 35%.  He had a right and left heart cath that showed severe three-vessel coronary artery disease including CTO of the first diagonal, OM1, mid LAD and ostial RPDA.  He had a cardiac MRI with LVEF of 16% and severely reduced RV systolic function.  Unfortunately coronary anatomy was not amenable to revascularization.  He had a repeat echo in November 2023 that showed an LVEF of 30% with now normal RV systolic function.  Since that time he has undergone ICD placement by Dr. Quentin Ore.  Interval hx:  Mr. Tarry still feels fairly limited by fatigue; he has had continued improvement in dyspnea. He completed a 12 week cardiac rehab program and is now working out regularly in the [employer]. He is also attempting to lose weight with dietary control. He feels very anxious about his recent diagnosis of HFrEF but reports no chest pain, nausea, diaphoresis or LE edema.   Activity level/exercise tolerance:  NYHA III Orthopnea:  Sleeps on 2-3 pillows; previously sleeping inclined at 90 degrees Paroxysmal noctural dyspnea:  No, minimal Chest pain/pressure:  no Orthostatic lightheadedness:  no Palpitations:  no Lower extremity edema:  no Presyncope/syncope:  no Cough:  no  Past Medical History:  Diagnosis Date   CHF (congestive heart failure) (HCC)    Coronary artery disease    Diabetes mellitus without complication (HCC)    GERD (gastroesophageal reflux disease)    Hypertension    Neuropathy     Current Outpatient Medications  Medication Sig Dispense Refill    Accu-Chek Softclix Lancets lancets Use as directed to check blood glucose daily 100 each 0   acetaminophen (TYLENOL) 500 MG tablet Take 2 tablets (1,000 mg total) by mouth every 6 (six) hours as needed for mild pain, moderate pain, fever or headache. 30 tablet 0   ALPRAZolam (XANAX) 1 MG tablet Take 1/2  tablet (0.5 mg total) by mouth 3 (three) times daily as needed for anxiety. 90 tablet 0   aspirin EC 81 MG tablet Take 1 tablet (81 mg total) by mouth daily. Swallow whole. 90 tablet 2   atorvastatin (LIPITOR) 80 MG tablet Take 1 tablet (80 mg total) by mouth daily. 90 tablet 2   Blood Glucose Monitoring Suppl (ACCU-CHEK GUIDE ME) w/Device KIT Use as directed daily 1 kit 0   dapagliflozin propanediol (FARXIGA) 10 MG TABS tablet Take one tablet by mouth daily 90 tablet 3   digoxin (LANOXIN) 0.125 MG tablet Take 1 tablet (0.125 mg total) by mouth daily. 90 tablet 2   docusate sodium (STOOL SOFTENER) 250 MG capsule Take 250 mg by mouth daily.     furosemide (LASIX) 40 MG tablet Take 1.5 tablets (60 mg total) by mouth daily. 135 tablet 3   gabapentin (NEURONTIN) 100 MG capsule Take 1 capsule (100 mg total) by mouth 3 (three) times daily. 60 capsule 1   glucose blood test strip Use as directed once daily 100 each 0   metFORMIN (GLUCOPHAGE) 500 MG tablet Take 1 tablet (500 mg total) by mouth 2 (two) times daily. 180 tablet 3  metoprolol succinate (TOPROL-XL) 25 MG 24 hr tablet Take 0.5 tablets (12.5 mg total) by mouth daily. 45 tablet 2   pantoprazole (PROTONIX) 40 MG tablet Take 1 tablet (40 mg total) by mouth daily. 30 tablet 11   potassium chloride SA (KLOR-CON M) 20 MEQ tablet Take 1 tablet (20 mEq total) by mouth daily. 90 tablet 2   spironolactone (ALDACTONE) 25 MG tablet Take 0.5 tablets (12.5 mg total) by mouth daily. 45 tablet 2   No current facility-administered medications for this visit.    No Known Allergies    Social History   Socioeconomic History   Marital status: Single     Spouse name: Not on file   Number of children: Not on file   Years of education: Not on file   Highest education level: Not on file  Occupational History   Not on file  Tobacco Use   Smoking status: Former    Packs/day: 1.00    Years: 32.00    Total pack years: 32.00    Types: Cigarettes    Quit date: 02/04/2022    Years since quitting: 0.6   Smokeless tobacco: Never  Vaping Use   Vaping Use: Not on file  Substance and Sexual Activity   Alcohol use: Not Currently   Drug use: Never   Sexual activity: Not Currently  Other Topics Concern   Not on file  Social History Narrative   Not on file   Social Determinants of Health   Financial Resource Strain: High Risk (03/09/2022)   Overall Financial Resource Strain (CARDIA)    Difficulty of Paying Living Expenses: Hard  Food Insecurity: Food Insecurity Present (03/09/2022)   Hunger Vital Sign    Worried About Running Out of Food in the Last Year: Sometimes true    Ran Out of Food in the Last Year: Sometimes true  Transportation Needs: No Transportation Needs (06/21/2022)   PRAPARE - Hydrologist (Medical): No    Lack of Transportation (Non-Medical): No  Physical Activity: Not on file  Stress: Not on file  Social Connections: Not on file  Intimate Partner Violence: Not on file      Family History  Problem Relation Age of Onset   Congestive Heart Failure Father     PHYSICAL EXAM: There were no vitals filed for this visit. GENERAL: Well nourished, well developed, and in no apparent distress at rest.  HEENT: Negative for arcus senilis or xanthelasma. There is no scleral icterus.  The mucous membranes are pink and moist.   NECK: Supple, No masses. Normal carotid upstrokes without bruits. No masses or thyromegaly.    CHEST: There are no chest wall deformities. There is no chest wall tenderness. Respirations are unlabored.  Lungs- CTA b/l CARDIAC:  JVP: difficult to assess due to body habitus          Normal S1, S2  Normal rate with regular rhythm. No murmurs, rubs or gallops.  Pulses are 2+ and symmetrical in upper and lower extremities. 1+ edema.  ABDOMEN: Soft, non-tender, non-distended. There are no masses or hepatomegaly. There are normal bowel sounds.  EXTREMITIES: Warm and well perfused with no cyanosis, clubbing.  LYMPHATIC: No axillary or supraclavicular lymphadenopathy.  NEUROLOGIC: Patient is oriented x3 with no focal or lateralizing neurologic deficits.  PSYCH: Patients affect is appropriate, there is no evidence of anxiety or depression.  SKIN: Warm and dry; no lesions or wounds.   DATA REVIEW  ECG: 08/31/22: NSR w/ old anterior  infarct.   ECHO: 07/19/22: LVEF 30%, normal RV function 02/06/22: LVEF 30-35%, RV not well visualized.   CATH: Severe three-vessel coronary artery disease, including chronic total/subtotal occlusions of large D1 and OM1 branches, mid LAD, and ostial rPDA.  There is also moderate-severe disease involving the proximal and distal LCx as well as the distal RCA. Moderately elevated left heart, right heart, and pulmonary artery pressures (LVEDP 32 mmHg, PCWP 30 mmHg, mean RA 9 mmHg, RVEDP 14 mmHg, and mean PAP 37 mmHg). Low normal Fick cardiac output/index (CO 5.7 L/min, CI 2.5 L/min/m^2).  CMR 02/23/22:  1.  Severely reduced Bi-ventricular function.  LVEF 16%, RVEF 19%.  2. There is transmural subendocardial scar in the LV anterior wall and apex (LAD territory), indicating non viability.  3.  There is 25-50% subendocardial LGE/scar in the LV lateral wall.  4. The LV lateral and inferior walls appear viable (RCA and Lcx territory).  5.  No significant valvular abnormalities.  6. Findings consistent with ischemic cardiomyopathy with viable LV lateral and inferior walls. Non-viable LV anterior and apical wall.   ASSESSMENT & PLAN:  Heart Failure with reduced EF Etiology of [id number] cardiomyopathy, severe multivessel CAD as noted above .  NYHA  class / AHA Stage:III Volume status & Diuretics: Euvolemic to mildly hypervolemic; lasix '60mg'$  daily Vasodilators:start losartan 12.'5mg'$  qhs Beta-Blocker:digoxin 0.125, increase toprol to '25mg'$  daily [id number] 12.'5mg'$  daaily Cardiometabolic:farxiga '10mg'$  daily Devices therapies & Valvulopathies:primary prevention ICD by Dr. Quentin Ore Advanced therapies:Mr. Burningham will very likely require advanced therapies at some point. His CMR demonstrates transmural scar of the anterior LV myocardium with severely reduced function. At this point, his weight, frequent panic attacks and motivation / insight are the biggest limiting factors. For the time being will continue to slowly uptitrate medical therapy.   2. CAD - Severe multivessel CAD as noted above - Repeat lipid panel today; may require Repatha to maintain adequate LDL control.  - Lipitor '80mg'$ , Asa '81mg'$  daily   3. T2DM - Metformin - SGLT2i  4. Obesity  - BMI 35 - Working on weight loss; discussed at length today.   5. Hyperlipidemia  - Repeat lipid panel  - lipitor '80mg'$  daily   Personally reviewed all imaging, ECGs and lab results; discussed at length with patient today (>45 mins)  Susie Pousson Advanced Heart Failure Mechanical Circulatory Support

## 2022-10-17 NOTE — Patient Instructions (Signed)
Medication Changes:  Increase your Metoprolol to 25 mg (1 tablet) a day.  Begin taking Losartan 12.5 mg (0.5 tab) at bedtime.   Lab Work:  Labs done today, your results will be available in MyChart, we will contact you for abnormal readings.   Special Instructions // Education:  Do the following things EVERYDAY: Weigh yourself in the morning before breakfast. Write it down and keep it in a log. Take your medicines as prescribed Eat low salt foods--Limit salt (sodium) to 2000 mg per day.  Stay as active as you can everyday Limit all fluids for the day to less than 2 liters   Follow-Up in: follow up in two months with Dr. Daniel Nones.     If you have any questions or concerns before your next appointment please send [place] a message through [facility] or call our office at [phone] Monday-Friday 8 am-5 pm.   If you have an urgent need after hours on the weekend please call your Primary Cardiologist or the [facility] in [place] at [phone].

## 2022-10-18 LAB — LDL CHOLESTEROL, DIRECT: Direct LDL: 91 mg/dL (ref 0–99)

## 2022-11-12 ENCOUNTER — Other Ambulatory Visit: Payer: Self-pay

## 2022-11-12 ENCOUNTER — Other Ambulatory Visit: Payer: Self-pay | Admitting: Nurse Practitioner

## 2022-11-13 ENCOUNTER — Other Ambulatory Visit: Payer: Self-pay

## 2022-11-14 ENCOUNTER — Telehealth: Payer: Self-pay | Admitting: Cardiology

## 2022-11-14 ENCOUNTER — Other Ambulatory Visit: Payer: Self-pay

## 2022-11-14 MED FILL — Alprazolam Tab 1 MG: ORAL | 5 days supply | Qty: 8 | Fill #0 | Status: AC

## 2022-11-14 MED FILL — Alprazolam Tab 1 MG: ORAL | 25 days supply | Qty: 37 | Fill #0 | Status: AC

## 2022-11-14 NOTE — Telephone Encounter (Signed)
Chart reviewed as part of preoperative evaluation for [facility]. Per protocol, patients of AHF clinic must be cleared by their providers.  Dr. Daniel Nones, please review request for medical clearance as well as request to hold aspirin for upcoming dental extractions.   Please forward your response to the requesting provider.  Thank you, Sharyn Lull

## 2022-11-14 NOTE — Telephone Encounter (Signed)
   Primary Cardiologist: Kate Sable, MD  Chart reviewed as part of pre-operative protocol coverage. Given past medical history and time since last visit, based on ACC/AHA guidelines, Lawrence Murray would be at acceptable risk for the planned procedure without further cardiovascular testing.   Patient was advised that if he develops new symptoms prior to surgery to contact our office to arrange a follow-up appointment. He verbalized understanding.  Per office protocol, he may hold aspirin for 5-7 days prior to procedure and should resume as soon as hemodynamically stable postoperatively.  SBE prophylaxis is not needed.   I will route this recommendation to the requesting party via Epic fax function and remove from pre-op pool.  Please call with questions.  Emmaline Life, NP-C  11/14/2022, 1:07 PM [address], [address] Office [phone] Fax [phone]

## 2022-11-14 NOTE — Telephone Encounter (Signed)
   Pre-operative Risk Assessment    Patient Name: Lawrence Murray  DOB: 1966/01/05 MRN: [id number]     Request for Surgical Clearance    Procedure:  Dental Extraction - Amount of Teeth to be Pulled:  4 SIMPLE EXTRACTIONS  AND DEEP CLEANING  Date of Surgery:  Clearance TBD                                 Surgeon:  DR. HUNT Surgeon's Group or Practice Name:  [facility] Phone number:  [phone] Fax number:  [phone]   Type of Clearance Requested:   - Medical  - Pharmacy:  Hold Aspirin NOT INDICATED HOW LONG   Type of Anesthesia:  Local    Additional requests/questions:    Astrid Divine   11/14/2022, 8:50 AM

## 2022-11-14 NOTE — Telephone Encounter (Signed)
   Pre-operative Risk Assessment    Patient Name: Lawrence Murray  DOB: Jul 21, 1966 MRN: [id number]     Request for Surgical Clearance    Procedure:  $ extrsctions, perio scaling root upper left lower left, endodontic therapy anterior tooth  Date of Surgery:  Clearance TBD                                 Surgeon:  not listed Surgeon's Group or Practice Name:  [facility] Phone number:  [phone] Fax number:  [phone]   Type of Clearance Requested:  both medical & pharmacy    Type of Anesthesia:  Not Indicated   Additional requests/questions:    Signed, Maxwell Caul   11/14/2022, 8:23 AM

## 2022-11-15 ENCOUNTER — Other Ambulatory Visit: Payer: Self-pay

## 2022-12-10 ENCOUNTER — Other Ambulatory Visit: Payer: Self-pay | Admitting: Nurse Practitioner

## 2022-12-10 MED FILL — Alprazolam Tab 1 MG: ORAL | 30 days supply | Qty: 45 | Fill #1 | Status: AC

## 2022-12-11 ENCOUNTER — Other Ambulatory Visit: Payer: Self-pay

## 2022-12-11 MED ORDER — GABAPENTIN 100 MG PO CAPS
100.0000 mg | ORAL_CAPSULE | Freq: Three times a day (TID) | ORAL | 1 refills | Status: DC
  Filled 2022-12-11: qty 60, 20d supply, fill #0

## 2022-12-17 ENCOUNTER — Encounter: Payer: Self-pay | Admitting: Cardiology

## 2022-12-17 ENCOUNTER — Ambulatory Visit: Payer: Medicaid Other | Attending: Cardiology | Admitting: Cardiology

## 2022-12-17 ENCOUNTER — Other Ambulatory Visit: Payer: Self-pay

## 2022-12-17 VITALS — BP 105/66 | HR 87 | Wt 279.4 lb

## 2022-12-17 DIAGNOSIS — I5022 Chronic systolic (congestive) heart failure: Secondary | ICD-10-CM | POA: Diagnosis not present

## 2022-12-17 MED ORDER — LOSARTAN POTASSIUM 25 MG PO TABS
25.0000 mg | ORAL_TABLET | Freq: Every day | ORAL | 3 refills | Status: DC
  Filled 2022-12-17: qty 30, 30d supply, fill #0
  Filled 2023-01-18: qty 90, 90d supply, fill #1

## 2022-12-17 NOTE — Patient Instructions (Addendum)
INCREASE Losartan to 25mg  daily  Your provider requests you have an echocardiogram. (We will call you to schedule)  Follow up in 3 months  Do the following things EVERYDAY: Weigh yourself in the morning before breakfast. Write it down and keep it in a log. Take your medicines as prescribed Eat low salt foods--Limit salt (sodium) to 2000 mg per day.  Stay as active as you can everyday Limit all fluids for the day to less than 2 liters

## 2022-12-17 NOTE — Progress Notes (Unsigned)
ADVANCED HEART FAILURE CLINIC NOTE  Referring Physician: No ref. provider found  Primary Care: Default, Provider, MD Primary Cardiologist: Debbe Odea, MD  HPI: Lawrence Murray is a 57 y.o. male with HFrEF 2/2 ischemic cardiomyopathy s/p ICD (09/19/22), CAD, obesity, hx of tobacco use. Lawrence Murray' cardiac history dates back to June 2023 when he was admitted for acute heart failure exacerbation.  He had an echocardiogram at that time demonstrating an LVEF of 30 to 35%.  He had a right and left heart cath that showed severe three-vessel coronary artery disease including CTO of the first diagonal, OM1, mid LAD and ostial RPDA.  He had a cardiac MRI with LVEF of 16% and severely reduced RV systolic function.  Unfortunately coronary anatomy was not amenable to revascularization.  He had a repeat echo in November 2023 that showed an LVEF of 30% with now normal RV systolic function.  Since that time he has undergone ICD placement by Dr. Lalla Brothers.  Interval hx:  Doing well from a functional standpoint; no lightheadedness, minimal SOB, no chest pressure. He is frustrated with his overall lack of communication between physicians. Unfortunately he has also gained weight once more. Reports that this is due to difficulty with maintaining a low fluid intake and limited caloric intake.   Activity level/exercise tolerance:  NYHA IIB Orthopnea:  Sleeps on 2-3 pillows; previously sleeping inclined at 90 degrees Paroxysmal noctural dyspnea:  No, minimal Chest pain/pressure:  no Orthostatic lightheadedness:  no Palpitations:  no Lower extremity edema:  no Presyncope/syncope:  no Cough:  no  Past Medical History:  Diagnosis Date   CHF (congestive heart failure) (HCC)    Coronary artery disease    Diabetes mellitus without complication (HCC)    GERD (gastroesophageal reflux disease)    Hypertension    Neuropathy     Current Outpatient Medications  Medication Sig Dispense Refill   Accu-Chek Softclix  Lancets lancets Use as directed to check blood glucose daily 100 each 0   acetaminophen (TYLENOL) 500 MG tablet Take 2 tablets (1,000 mg total) by mouth every 6 (six) hours as needed for mild pain, moderate pain, fever or headache. 30 tablet 0   ALPRAZolam (XANAX) 1 MG tablet Take 1/2  tablet (0.5 mg total) by mouth 3 (three) times daily as needed for anxiety. 90 tablet 0   aspirin EC 81 MG tablet Take 1 tablet (81 mg total) by mouth daily. Swallow whole. 90 tablet 2   atorvastatin (LIPITOR) 80 MG tablet Take 1 tablet (80 mg total) by mouth daily. 90 tablet 2   Blood Glucose Monitoring Suppl (ACCU-CHEK GUIDE ME) w/Device KIT Use as directed daily 1 kit 0   dapagliflozin propanediol (FARXIGA) 10 MG TABS tablet Take one tablet by mouth daily 90 tablet 3   digoxin (LANOXIN) 0.125 MG tablet Take 1 tablet (0.125 mg total) by mouth daily. 90 tablet 2   docusate sodium (STOOL SOFTENER) 250 MG capsule Take 250 mg by mouth daily.     furosemide (LASIX) 40 MG tablet Take 1.5 tablets (60 mg total) by mouth daily. 135 tablet 3   gabapentin (NEURONTIN) 100 MG capsule Take 1 capsule (100 mg total) by mouth 3 (three) times daily. 60 capsule 1   glucose blood test strip Use as directed once daily 100 each 0   losartan (COZAAR) 25 MG tablet Take 0.5 tablets (12.5 mg total) by mouth at bedtime. 30 tablet 3   metFORMIN (GLUCOPHAGE) 500 MG tablet Take 1 tablet (500 mg total) by  mouth 2 (two) times daily. 180 tablet 3   metoprolol succinate (TOPROL-XL) 25 MG 24 hr tablet Take 1 tablet (25 mg total) by mouth daily. 30 tablet 3   pantoprazole (PROTONIX) 40 MG tablet Take 1 tablet (40 mg total) by mouth daily. 30 tablet 11   potassium chloride SA (KLOR-CON M) 20 MEQ tablet Take 1 tablet (20 mEq total) by mouth daily. 90 tablet 2   spironolactone (ALDACTONE) 25 MG tablet Take 0.5 tablets (12.5 mg total) by mouth daily. 45 tablet 2   No current facility-administered medications for this visit.    No Known Allergies     Social History   Socioeconomic History   Marital status: Single    Spouse name: Not on file   Number of children: Not on file   Years of education: Not on file   Highest education level: Not on file  Occupational History   Not on file  Tobacco Use   Smoking status: Former    Packs/day: 1.00    Years: 32.00    Additional pack years: 0.00    Total pack years: 32.00    Types: Cigarettes    Quit date: 02/04/2022    Years since quitting: 0.8   Smokeless tobacco: Never  Vaping Use   Vaping Use: Not on file  Substance and Sexual Activity   Alcohol use: Not Currently   Drug use: Never   Sexual activity: Not Currently  Other Topics Concern   Not on file  Social History Narrative   Not on file   Social Determinants of Health   Financial Resource Strain: High Risk (03/09/2022)   Overall Financial Resource Strain (CARDIA)    Difficulty of Paying Living Expenses: Hard  Food Insecurity: Food Insecurity Present (03/09/2022)   Hunger Vital Sign    Worried About Running Out of Food in the Last Year: Sometimes true    Ran Out of Food in the Last Year: Sometimes true  Transportation Needs: No Transportation Needs (06/21/2022)   PRAPARE - Administrator, Civil Service (Medical): No    Lack of Transportation (Non-Medical): No  Physical Activity: Not on file  Stress: Not on file  Social Connections: Not on file  Intimate Partner Violence: Not on file      Family History  Problem Relation Age of Onset   Congestive Heart Failure Father     PHYSICAL EXAM: Vitals:   12/17/22 1446  BP: 105/66  Pulse: 87  SpO2: 98%   GENERAL: Well nourished, well developed, and in no apparent distress at rest.  HEENT: Negative for arcus senilis or xanthelasma. There is no scleral icterus.  The mucous membranes are pink and moist.   NECK: Supple, No masses. Normal carotid upstrokes without bruits. No masses or thyromegaly.    CHEST: There are no chest wall deformities. There is no chest  wall tenderness. Respirations are unlabored.  Lungs- CTA B/L CARDIAC:  JVP: 7 cm          Normal rate with regular rhythm. No murmurs, rubs or gallops.  Pulses are 2+ and symmetrical in upper and lower extremities. No edema.  ABDOMEN: Soft, non-tender, non-distended. There are no masses or hepatomegaly. There are normal bowel sounds.  EXTREMITIES: Warm and well perfused with no cyanosis, clubbing.  LYMPHATIC: No axillary or supraclavicular lymphadenopathy.  NEUROLOGIC: Patient is oriented x3 with no focal or lateralizing neurologic deficits.  PSYCH: Patients affect is appropriate, there is no evidence of anxiety or depression.  SKIN: Warm  and dry; no lesions or wounds.   DATA REVIEW  ECG: 08/31/22: NSR w/ old anterior infarct.   ECHO: 07/19/22: LVEF 30%, normal RV function 02/06/22: LVEF 30-35%, RV not well visualized.   CATH: Severe three-vessel coronary artery disease, including chronic total/subtotal occlusions of large D1 and OM1 branches, mid LAD, and ostial rPDA.  There is also moderate-severe disease involving the proximal and distal LCx as well as the distal RCA. Moderately elevated left heart, right heart, and pulmonary artery pressures (LVEDP 32 mmHg, PCWP 30 mmHg, mean RA 9 mmHg, RVEDP 14 mmHg, and mean PAP 37 mmHg). Low normal Fick cardiac output/index (CO 5.7 L/min, CI 2.5 L/min/m^2).  CMR 02/23/22:  1.  Severely reduced Bi-ventricular function.  LVEF 16%, RVEF 19%.  2. There is transmural subendocardial scar in the LV anterior wall and apex (LAD territory), indicating non viability.  3.  There is 25-50% subendocardial LGE/scar in the LV lateral wall.  4. The LV lateral and inferior walls appear viable (RCA and Lcx territory).  5.  No significant valvular abnormalities.  6. Findings consistent with ischemic cardiomyopathy with viable LV lateral and inferior walls. Non-viable LV anterior and apical wall.  ABI 08/15/22:  Right: Resting right ankle-brachial index is within  normal range. The  right toe-brachial index is normal.  Left: Resting left ankle-brachial index is within normal range. The left  toe-brachial index is normal.    ASSESSMENT & PLAN:  Heart Failure with reduced EF Etiology of [id number] cardiomyopathy, severe multivessel CAD as noted above .  NYHA class / AHA Stage:III Volume status & Diuretics: Euvolemic to mildly hypervolemic; lasix 60mg  daily Vasodilators: increase losartan to 25mg  qHS.  Beta-Blocker:digoxin 0.125,  [id number] 12.5mg  daaily Cardiometabolic:farxiga 10mg  daily Devices therapies & Valvulopathies:primary prevention ICD by Dr. Lalla Brothers Advanced therapies:Lawrence Murray will very likely require advanced therapies at some point. His CMR demonstrates transmural scar of the anterior LV myocardium with severely reduced function. At this point, his weight, frequent panic attacks and motivation / insight are the biggest limiting factors. For the time being will continue to slowly uptitrate medical therapy. Repeat echocardiogram prior to next visit.   2. CAD - Severe multivessel CAD as noted above - Repeat lipid panel today; may require Repatha to maintain adequate LDL control.  - Lipitor 80mg , Asa 81mg  daily   3. T2DM - Metformin - SGLT2i  4. Obesity  - BMI 35 - Working on weight loss; discussed at length today.   5. Hyperlipidemia  - Prior lipid panel with severely elevated triglycerides; unable to calculate LDL. Will repeat fasting lipid panel and refer to lipid clinic.  - lipitor 80mg  daily   6. PAD - CT angiogram from June 2023 with RLE anterior tibial artery occlusion. Left lower extremity only has one vessel runoff. He had a follow up angiogram that  -  Most recent ABIs in December 2023 normal.   Nichola Warren Advanced Heart Failure Mechanical Circulatory Support

## 2022-12-19 ENCOUNTER — Ambulatory Visit: Payer: Medicaid Other | Attending: Cardiology | Admitting: Cardiology

## 2022-12-19 ENCOUNTER — Other Ambulatory Visit: Payer: Self-pay

## 2022-12-19 ENCOUNTER — Encounter: Payer: Medicaid Other | Admitting: Cardiology

## 2022-12-19 ENCOUNTER — Encounter: Payer: Self-pay | Admitting: Cardiology

## 2022-12-19 VITALS — BP 98/66 | HR 76 | Ht 73.0 in | Wt 276.0 lb

## 2022-12-19 DIAGNOSIS — I10 Essential (primary) hypertension: Secondary | ICD-10-CM | POA: Diagnosis not present

## 2022-12-19 DIAGNOSIS — Z95810 Presence of automatic (implantable) cardiac defibrillator: Secondary | ICD-10-CM

## 2022-12-19 DIAGNOSIS — I5022 Chronic systolic (congestive) heart failure: Secondary | ICD-10-CM

## 2022-12-19 LAB — CUP PACEART INCLINIC DEVICE CHECK
Date Time Interrogation Session: 20240501155253
HighPow Impedance: 85 Ohm
Implantable Lead Connection Status: 753985
Implantable Lead Implant Date: 20240131
Implantable Lead Location: 753860
Implantable Lead Model: 673
Implantable Lead Serial Number: 204314
Implantable Pulse Generator Implant Date: 20240131
Lead Channel Impedance Value: 628 Ohm
Lead Channel Pacing Threshold Amplitude: 0.7 V
Lead Channel Pacing Threshold Pulse Width: 0.4 ms
Lead Channel Sensing Intrinsic Amplitude: 25 mV
Lead Channel Setting Pacing Amplitude: 2 V
Lead Channel Setting Pacing Pulse Width: 0.4 ms
Lead Channel Setting Sensing Sensitivity: 0.5 mV
Pulse Gen Serial Number: 322340

## 2022-12-19 NOTE — Progress Notes (Signed)
  Electrophysiology Office Follow up Visit Note:    Date:  12/19/2022   ID:  Lawrence Murray, DOB 1965/09/27, MRN [id number]  PCP:  Lawrence Dies, NP  [facility] Cardiologist:  Lawrence Odea, MD  [facility] Electrophysiologist:  Lawrence Prude, MD    Interval History:    Lawrence Murray is a 57 y.o. male who presents for a follow up visit.   He had an ICD implanted September 19, 2022.  He has done well after his ICD implant.  He is established with Lawrence Murray for heart failure management.  He is being worked up for possible future advanced therapies.       Past medical, surgical, social and family history were reviewed.  ROS:   Please see the history of present illness.    All other systems reviewed and are negative.  EKGs/Labs/Other Studies Reviewed:    The following studies were reviewed today:  Dec 19, 2022 in clinic device interrogation personally reviewed Battery longevity 15 years Lead parameter stable No high-voltage therapies No atrial fibrillation Less than 1% V paced  EKG:  The ekg ordered today demonstrates sinus rhythm.  Low voltage.   Physical Exam:    VS:  BP 98/66   Pulse 76   Ht 6\' 1"  (1.854 m)   Wt 276 lb (125.2 kg)   SpO2 98%   BMI 36.41 kg/m     Wt Readings from Last 3 Encounters:  12/19/22 276 lb (125.2 kg)  12/17/22 279 lb 6 oz (126.7 kg)  10/17/22 272 lb 9.6 oz (123.7 kg)     GEN:  Well nourished, well developed in no acute distress.  Obese CARDIAC: RRR, no murmurs, rubs, gallops.  ICD pocket well-healed RESPIRATORY:  Clear to auscultation without rales, wheezing or rhonchi       ASSESSMENT:    1. Chronic systolic heart failure (HCC)    PLAN:    In order of problems listed above:   #Chronic systolic heart failure NYHA class II-III.  Warm and dry on exam today.  Follows with Lawrence Murray in the heart failure clinic.  #ICD in situ Device functioning appropriately.  Continue remote  monitoring.  #Hypertension At goal today.  Recommend checking blood pressures 1-2 times per week at home and recording the values.  Recommend bringing these recordings to the primary care physician.       Signed, Lawrence Dunn, MD, [facility], [facility] 12/19/2022 10:14 PM    Electrophysiology [facility]

## 2022-12-19 NOTE — Patient Instructions (Signed)
Medication Instructions:  Your physician recommends that you continue on your current medications as directed. Please refer to the Current Medication list given to you today.  *If you need a refill on your cardiac medications before your next appointment, please call your pharmacy*  Follow-Up: At [facility], you and your health needs are our priority.  As part of our continuing mission to provide you with exceptional heart care, we have created designated Provider Care Teams.  These Care Teams include your primary Cardiologist (physician) and Advanced Practice Providers (APPs -  Physician Assistants and Nurse Practitioners) who all work together to provide you with the care you need, when you need it.  Your next appointment:   1 year(s)  Provider:   You may see CAMERON T LAMBERT, MD or one of the following Advanced Practice Providers on your designated Care Team:   Renee Ursuy, PA-C Michael "Andy" Tillery, PA-C Suzann Riddle, NP     

## 2022-12-20 ENCOUNTER — Ambulatory Visit (INDEPENDENT_AMBULATORY_CARE_PROVIDER_SITE_OTHER): Payer: Medicaid Other

## 2022-12-20 DIAGNOSIS — I255 Ischemic cardiomyopathy: Secondary | ICD-10-CM

## 2022-12-20 LAB — CUP PACEART REMOTE DEVICE CHECK
Battery Remaining Longevity: 180 mo
Battery Remaining Percentage: 100 %
Brady Statistic RV Percent Paced: 0 %
Date Time Interrogation Session: 20240502035100
HighPow Impedance: 77 Ohm
Implantable Lead Connection Status: 753985
Implantable Lead Implant Date: 20240131
Implantable Lead Location: 753860
Implantable Lead Model: 673
Implantable Lead Serial Number: 204314
Implantable Pulse Generator Implant Date: 20240131
Lead Channel Impedance Value: 603 Ohm
Lead Channel Pacing Threshold Amplitude: 0.7 V
Lead Channel Pacing Threshold Pulse Width: 0.4 ms
Lead Channel Setting Pacing Amplitude: 2 V
Lead Channel Setting Pacing Pulse Width: 0.4 ms
Lead Channel Setting Sensing Sensitivity: 0.5 mV
Pulse Gen Serial Number: 322340

## 2022-12-24 ENCOUNTER — Other Ambulatory Visit: Payer: Self-pay

## 2022-12-31 ENCOUNTER — Other Ambulatory Visit: Payer: Self-pay

## 2023-01-03 ENCOUNTER — Ambulatory Visit (INDEPENDENT_AMBULATORY_CARE_PROVIDER_SITE_OTHER): Payer: Medicaid Other | Admitting: Nurse Practitioner

## 2023-01-03 ENCOUNTER — Other Ambulatory Visit: Payer: Self-pay

## 2023-01-03 ENCOUNTER — Encounter: Payer: Self-pay | Admitting: Nurse Practitioner

## 2023-01-03 VITALS — BP 118/70 | HR 78 | Temp 98.3°F | Ht 74.0 in | Wt 282.6 lb

## 2023-01-03 DIAGNOSIS — I5022 Chronic systolic (congestive) heart failure: Secondary | ICD-10-CM

## 2023-01-03 DIAGNOSIS — G4700 Insomnia, unspecified: Secondary | ICD-10-CM | POA: Diagnosis not present

## 2023-01-03 DIAGNOSIS — G6289 Other specified polyneuropathies: Secondary | ICD-10-CM

## 2023-01-03 DIAGNOSIS — Z7984 Long term (current) use of oral hypoglycemic drugs: Secondary | ICD-10-CM | POA: Diagnosis not present

## 2023-01-03 DIAGNOSIS — I10 Essential (primary) hypertension: Secondary | ICD-10-CM | POA: Diagnosis not present

## 2023-01-03 DIAGNOSIS — I5042 Chronic combined systolic (congestive) and diastolic (congestive) heart failure: Secondary | ICD-10-CM

## 2023-01-03 DIAGNOSIS — E1165 Type 2 diabetes mellitus with hyperglycemia: Secondary | ICD-10-CM

## 2023-01-03 MED ORDER — HYDROXYZINE HCL 10 MG PO TABS
10.0000 mg | ORAL_TABLET | Freq: Three times a day (TID) | ORAL | 0 refills | Status: DC | PRN
Start: 2023-01-03 — End: 2023-01-03
  Filled 2023-01-03: qty 30, 10d supply, fill #0

## 2023-01-03 MED ORDER — HYDROXYZINE HCL 10 MG PO TABS
10.0000 mg | ORAL_TABLET | Freq: Every day | ORAL | 1 refills | Status: DC
Start: 2023-01-03 — End: 2023-03-14
  Filled 2023-01-03: qty 30, 30d supply, fill #0
  Filled 2023-02-01: qty 30, 30d supply, fill #1

## 2023-01-03 MED ORDER — GABAPENTIN 300 MG PO CAPS
300.0000 mg | ORAL_CAPSULE | Freq: Three times a day (TID) | ORAL | 3 refills | Status: DC
  Filled 2023-01-03: qty 90, 30d supply, fill #0
  Filled 2023-02-01: qty 90, 30d supply, fill #1
  Filled 2023-03-13: qty 90, 30d supply, fill #2
  Filled 2023-04-15: qty 90, 30d supply, fill #3

## 2023-01-03 NOTE — Progress Notes (Signed)
Established Patient Office Visit  Subjective:  Patient ID: Lawrence Murray, male    DOB: [birth year]  Age: 57 y.o. MRN: [id number]  CC:  Chief Complaint  Patient presents with  . Medical Management of Chronic Issues    HPI  Lawrence Murray presents for chronic disease management.  He had history of chronic heart failure, hypertension, diabetes, peripheral neuropathy, peripheral vascular disease and anxiety.  He had ICD implanted in January, 2024.     HPI   Past Medical History:  Diagnosis Date  . CHF (congestive heart failure) (HCC)   . Coronary artery disease   . Diabetes mellitus without complication (HCC)   . GERD (gastroesophageal reflux disease)   . Hypertension   . Neuropathy     Past Surgical History:  Procedure Laterality Date  . COLONOSCOPY WITH PROPOFOL N/A 06/14/2022   Procedure: COLONOSCOPY WITH PROPOFOL;  Surgeon: Wyline Mood, MD;  Location: [facility] ENDOSCOPY;  Service: Gastroenterology;  Laterality: N/A;  . ICD IMPLANT N/A 09/19/2022   Procedure: ICD IMPLANT;  Surgeon: Lanier Prude, MD;  Location[facility] INVASIVE CV LAB;  Service: Cardiovascular;  Laterality: N/A;  . LOWER EXTREMITY ANGIOGRAPHY Left 02/14/2022   Procedure: Lower Extremity Angiography;  Surgeon: Annice Needy, MD;  Location: [facility] INVASIVE CV LAB;  Service: Cardiovascular;  Laterality: Left;  . RIGHT/LEFT HEART CATH AND CORONARY ANGIOGRAPHY N/A 02/21/2022   Procedure: RIGHT/LEFT HEART CATH AND CORONARY ANGIOGRAPHY;  Surgeon: Yvonne Kendall, MD;  Location: [facility] INVASIVE CV LAB;  Service: Cardiovascular;  Laterality: N/A;    Family History  Problem Relation Age of Onset  . Congestive Heart Failure Father     Social History   Socioeconomic History  . Marital status: Single    Spouse name: Not on file  . Number of children: Not on file  . Years of education: Not on file  . Highest education level: 12th grade  Occupational History  . Not on file  Tobacco Use  . Smoking status: Former     Packs/day: 1.00    Years: 32.00    Additional pack years: 0.00    Total pack years: 32.00    Types: Cigarettes    Quit date: 02/04/2022    Years since quitting: 0.9  . Smokeless tobacco: Never  Vaping Use  . Vaping Use: Not on file  Substance and Sexual Activity  . Alcohol use: Not Currently    Comment: 3 beer in last 6 month  . Drug use: Never  . Sexual activity: Not Currently  Other Topics Concern  . Not on file  Social History Narrative  . Not on file   Social Determinants of Health   Financial Resource Strain: High Risk (12/31/2022)   Overall Financial Resource Strain (CARDIA)   . Difficulty of Paying Living Expenses: Very hard  Food Insecurity: No Food Insecurity (12/31/2022)   Hunger Vital Sign   . Worried About Programme researcher, broadcasting/film/video in the Last Year: Never true   . Ran Out of Food in the Last Year: Never true  Transportation Needs: Unmet Transportation Needs (12/31/2022)   PRAPARE - Transportation   . Lack of Transportation (Medical): Yes   . Lack of Transportation (Non-Medical): Yes  Physical Activity: Sufficiently Active (12/31/2022)   Exercise Vital Sign   . Days of Exercise per Week: 4 days   . Minutes of Exercise per Session: 90 min  Stress: Stress Concern Present (12/31/2022)   [employer] of Occupational Health - Occupational Stress Questionnaire   . Feeling of  Stress : Very much  Social Connections: Moderately Isolated (12/31/2022)   Social Connection and Isolation Panel [NHANES]   . Frequency of Communication with Friends and Family: Twice a week   . Frequency of Social Gatherings with Friends and Family: Once a week   . Attends Religious Services: 1 to 4 times per year   . Active Member of Clubs or Organizations: No   . Attends Banker Meetings: Not on file   . Marital Status: Never married  Intimate Partner Violence: Not on file     Outpatient Medications Prior to Visit  Medication Sig Dispense Refill  . Accu-Chek Softclix Lancets  lancets Use as directed to check blood glucose daily 100 each 0  . acetaminophen (TYLENOL) 500 MG tablet Take 2 tablets (1,000 mg total) by mouth every 6 (six) hours as needed for mild pain, moderate pain, fever or headache. 30 tablet 0  . ALPRAZolam (XANAX) 1 MG tablet Take 1/2  tablet (0.5 mg total) by mouth 3 (three) times daily as needed for anxiety. 90 tablet 0  . aspirin EC 81 MG tablet Take 1 tablet (81 mg total) by mouth daily. Swallow whole. 90 tablet 2  . atorvastatin (LIPITOR) 80 MG tablet Take 1 tablet (80 mg total) by mouth daily. 90 tablet 2  . Blood Glucose Monitoring Suppl (ACCU-CHEK GUIDE ME) w/Device KIT Use as directed daily 1 kit 0  . dapagliflozin propanediol (FARXIGA) 10 MG TABS tablet Take one tablet by mouth daily 90 tablet 3  . digoxin (LANOXIN) 0.125 MG tablet Take 1 tablet (0.125 mg total) by mouth daily. 90 tablet 2  . docusate sodium (STOOL SOFTENER) 250 MG capsule Take 250 mg by mouth daily.    . furosemide (LASIX) 40 MG tablet Take 1.5 tablets (60 mg total) by mouth daily. 135 tablet 3  . glucose blood test strip Use as directed once daily 100 each 0  . losartan (COZAAR) 25 MG tablet Take 1 tablet (25 mg total) by mouth at bedtime. 30 tablet 3  . metFORMIN (GLUCOPHAGE) 500 MG tablet Take 1 tablet (500 mg total) by mouth 2 (two) times daily. 180 tablet 3  . metoprolol succinate (TOPROL-XL) 25 MG 24 hr tablet Take 1 tablet (25 mg total) by mouth daily. 30 tablet 3  . pantoprazole (PROTONIX) 40 MG tablet Take 1 tablet (40 mg total) by mouth daily. 30 tablet 11  . potassium chloride SA (KLOR-CON M) 20 MEQ tablet Take 1 tablet (20 mEq total) by mouth daily. 90 tablet 2  . spironolactone (ALDACTONE) 25 MG tablet Take 0.5 tablets (12.5 mg total) by mouth daily. 45 tablet 2  . gabapentin (NEURONTIN) 100 MG capsule Take 1 capsule (100 mg total) by mouth 3 (three) times daily. 60 capsule 1   No facility-administered medications prior to visit.    No Known  Allergies  ROS Review of Systems    Objective:    Physical Exam  BP 118/70   Pulse 78   Temp 98.3 F (36.8 C) (Oral)   Ht 6\' 2"  (1.88 m)   Wt 282 lb 9.6 oz (128.2 kg)   SpO2 97%   BMI 36.28 kg/m  Wt Readings from Last 3 Encounters:  01/03/23 282 lb 9.6 oz (128.2 kg)  12/19/22 276 lb (125.2 kg)  12/17/22 279 lb 6 oz (126.7 kg)     Health Maintenance  Topic Date Due  . Diabetic kidney evaluation - Urine ACR  Never done  . Zoster Vaccines- Shingrix (2 of  2) 08/03/2022  . Hepatitis C Screening  06/02/2023 (Originally 06/09/1984)  . COVID-19 Vaccine (1) 06/20/2023 (Originally 12/09/1966)  . INFLUENZA VACCINE  03/21/2023  . FOOT EXAM  05/16/2023  . HEMOGLOBIN A1C  07/06/2023  . OPHTHALMOLOGY EXAM  07/25/2023  . Lung Cancer Screening  08/02/2023  . Diabetic kidney evaluation - eGFR measurement  10/18/2023  . Colonoscopy  06/14/2029  . DTaP/Tdap/Td (2 - Td or Tdap) 06/01/2032  . HIV Screening  Completed  . HPV VACCINES  Aged Out    There are no preventive care reminders to display for this patient.  Lab Results  Component Value Date   TSH 9.011 (H) 02/05/2022   Lab Results  Component Value Date   WBC 5.9 09/06/2022   HGB 14.4 09/06/2022   HCT 41.6 09/06/2022   MCV 99.5 09/06/2022   PLT 159 09/06/2022   Lab Results  Component Value Date   NA 134 (L) 10/17/2022   K 4.3 10/17/2022   CO2 24 10/17/2022   GLUCOSE 246 (H) 10/17/2022   BUN 28 (H) 10/17/2022   CREATININE 1.29 (H) 10/17/2022   BILITOT 1.0 02/11/2022   ALKPHOS 78 02/11/2022   AST 22 02/11/2022   ALT 27 02/11/2022   PROT 6.8 02/11/2022   ALBUMIN 3.1 (L) 02/11/2022   CALCIUM 9.6 10/17/2022   ANIONGAP 10 10/17/2022   Lab Results  Component Value Date   CHOL 195 10/17/2022   Lab Results  Component Value Date   HDL 33 (L) 10/17/2022   Lab Results  Component Value Date   LDLCALC UNABLE TO CALCULATE IF TRIGLYCERIDE OVER 400 mg/dL 16/05/9603   Lab Results  Component Value Date   TRIG 439  (H) 10/17/2022   Lab Results  Component Value Date   CHOLHDL 5.9 10/17/2022   Lab Results  Component Value Date   HGBA1C 10.8 (H) 01/03/2023      Assessment & Plan:  Type 2 diabetes mellitus with hyperglycemia, without long-term current use of insulin (HCC) -     Hemoglobin A1c  Chronic systolic heart failure (HCC)  Other polyneuropathy  Primary hypertension  Insomnia, unspecified type -     hydrOXYzine HCl; Take 1 tablet (10 mg total) by mouth at bedtime.  Dispense: 30 tablet; Refill: 1  Other orders -     Gabapentin; Take 1 capsule (300 mg total) by mouth 3 (three) times daily.  Dispense: 90 capsule; Refill: 3    Follow-up: Return in about 6 months (around 07/06/2023) for chronic management.   Kara Dies, NP

## 2023-01-04 LAB — HEMOGLOBIN A1C: Hgb A1c MFr Bld: 10.8 % — ABNORMAL HIGH (ref 4.6–6.5)

## 2023-01-07 ENCOUNTER — Encounter: Payer: Medicaid Other | Admitting: Cardiology

## 2023-01-07 ENCOUNTER — Other Ambulatory Visit: Payer: Self-pay

## 2023-01-09 ENCOUNTER — Other Ambulatory Visit: Payer: Self-pay

## 2023-01-09 NOTE — Progress Notes (Signed)
Remote ICD transmission.   

## 2023-01-11 ENCOUNTER — Other Ambulatory Visit: Payer: Self-pay | Admitting: Nurse Practitioner

## 2023-01-14 ENCOUNTER — Other Ambulatory Visit: Payer: Self-pay | Admitting: Nurse Practitioner

## 2023-01-14 ENCOUNTER — Other Ambulatory Visit: Payer: Self-pay

## 2023-01-15 ENCOUNTER — Ambulatory Visit: Payer: Medicaid Other

## 2023-01-15 ENCOUNTER — Other Ambulatory Visit: Payer: Self-pay

## 2023-01-15 DIAGNOSIS — G4700 Insomnia, unspecified: Secondary | ICD-10-CM | POA: Insufficient documentation

## 2023-01-15 NOTE — Assessment & Plan Note (Signed)
Advised pt to eat variety of food including fruits, vegetables, whole grains, complex carbohydrates and proteins.  Continue Farxiga and metformin Will check hemoglobin A1c.

## 2023-01-15 NOTE — Telephone Encounter (Signed)
LOV: 01/03/23  NOV: N/A

## 2023-01-15 NOTE — Assessment & Plan Note (Signed)
>>  ASSESSMENT AND PLAN FOR TYPE 2 DIABETES MELLITUS WITH HYPERGLYCEMIA (HCC) WRITTEN ON 01/15/2023  5:47 AM BY Kara Dies, NP  Advised pt to eat variety of food including fruits, vegetables, whole grains, complex carbohydrates and proteins.  Continue Farxiga and metformin Will check hemoglobin A1c.

## 2023-01-15 NOTE — Assessment & Plan Note (Signed)
Advised patient to try hydroxyzine for sleep. Advised to follow good sleep hygiene.

## 2023-01-15 NOTE — Assessment & Plan Note (Addendum)
Advised patient to increase gabapentin from 100 to 300 mg 3 times a day as needed. Will continue to monitor.

## 2023-01-15 NOTE — Assessment & Plan Note (Addendum)
Continue the current medication regimen. He is followed by cardiologist.

## 2023-01-16 ENCOUNTER — Other Ambulatory Visit: Payer: Self-pay

## 2023-01-16 MED FILL — Alprazolam Tab 1 MG: ORAL | 30 days supply | Qty: 45 | Fill #0 | Status: AC

## 2023-01-17 ENCOUNTER — Ambulatory Visit: Payer: Medicaid Other

## 2023-01-17 DIAGNOSIS — I5022 Chronic systolic (congestive) heart failure: Secondary | ICD-10-CM | POA: Diagnosis not present

## 2023-01-17 LAB — ECHOCARDIOGRAM COMPLETE
AR max vel: 2.64 cm2
AV Area VTI: 2.42 cm2
AV Area mean vel: 2.64 cm2
AV Mean grad: 3 mmHg
AV Peak grad: 4.8 mmHg
Ao pk vel: 1.1 m/s
Area-P 1/2: 2.62 cm2
Calc EF: 19 %
S' Lateral: 3.8 cm
Single Plane A2C EF: 17.1 %
Single Plane A4C EF: 25.7 %

## 2023-01-17 MED ORDER — PERFLUTREN LIPID MICROSPHERE
1.0000 mL | INTRAVENOUS | Status: AC | PRN
Start: 2023-01-17 — End: 2023-01-17
  Administered 2023-01-17: 4 mL via INTRAVENOUS

## 2023-01-18 ENCOUNTER — Telehealth: Payer: Self-pay | Admitting: Cardiology

## 2023-01-18 ENCOUNTER — Other Ambulatory Visit: Payer: Self-pay

## 2023-01-18 NOTE — Telephone Encounter (Signed)
Spoke with patient and reviewed that report has not yet been reviewed by provider who ordered the echocardiogram. He was concerned about the changes in the numbers and percentages. Advised that once those results are reviewed then someone will call with that information. He was somewhat upset because he would have to wait all weekend before he would get any results. Reviewed that it may be Monday before he hears anything. He verbalized understanding and had no further questions.

## 2023-01-18 NOTE — Telephone Encounter (Signed)
Pt would like a callback regarding Echo results. Pt states that it looks like his levels have dropped and would like to speak to a nurse as soon as possible. Please advise

## 2023-01-21 ENCOUNTER — Other Ambulatory Visit: Payer: Self-pay

## 2023-01-29 ENCOUNTER — Encounter: Payer: Self-pay | Admitting: Cardiology

## 2023-01-29 ENCOUNTER — Encounter: Payer: Self-pay | Admitting: Nurse Practitioner

## 2023-02-01 ENCOUNTER — Other Ambulatory Visit: Payer: Self-pay | Admitting: Nurse Practitioner

## 2023-02-01 ENCOUNTER — Other Ambulatory Visit: Payer: Self-pay

## 2023-02-01 ENCOUNTER — Telehealth: Payer: Self-pay

## 2023-02-01 MED ORDER — METFORMIN HCL 1000 MG PO TABS
1000.0000 mg | ORAL_TABLET | Freq: Two times a day (BID) | ORAL | 2 refills | Status: DC
  Filled 2023-02-01: qty 180, 90d supply, fill #0
  Filled 2023-05-14: qty 180, 90d supply, fill #1

## 2023-02-01 NOTE — Telephone Encounter (Signed)
LVM to call back to schedule appt in 2 mnths and to inform him that per Charan she is increasing the Metformin to 1000 mg instead of the 500 mg.

## 2023-02-01 NOTE — Telephone Encounter (Signed)
Pt aware of results Reports he will follow up with Dr Gasper Lloyd as scheduled

## 2023-02-03 ENCOUNTER — Other Ambulatory Visit: Payer: Self-pay | Admitting: Family

## 2023-02-03 ENCOUNTER — Other Ambulatory Visit: Payer: Self-pay

## 2023-02-04 NOTE — Telephone Encounter (Signed)
Pt returned [facility] CMA call. Note below was read to pt.Pt its aware and understood, and its scheduled to see provider for 04/09/23. As per pt, he would like to know if he needs blood work before that appt??

## 2023-02-05 ENCOUNTER — Other Ambulatory Visit: Payer: Self-pay | Admitting: Nurse Practitioner

## 2023-02-05 ENCOUNTER — Other Ambulatory Visit: Payer: Self-pay

## 2023-02-05 DIAGNOSIS — E119 Type 2 diabetes mellitus without complications: Secondary | ICD-10-CM

## 2023-02-05 NOTE — Telephone Encounter (Signed)
Hg A1c 2-3 days before appointment.

## 2023-02-11 ENCOUNTER — Other Ambulatory Visit: Payer: Self-pay | Admitting: Nurse Practitioner

## 2023-02-11 ENCOUNTER — Other Ambulatory Visit: Payer: Self-pay

## 2023-02-12 ENCOUNTER — Other Ambulatory Visit: Payer: Self-pay

## 2023-02-12 ENCOUNTER — Telehealth: Payer: Self-pay | Admitting: Nurse Practitioner

## 2023-02-12 ENCOUNTER — Other Ambulatory Visit: Payer: Self-pay | Admitting: Cardiology

## 2023-02-12 ENCOUNTER — Other Ambulatory Visit: Payer: Self-pay | Admitting: Family

## 2023-02-12 MED FILL — Alprazolam Tab 1 MG: ORAL | 30 days supply | Qty: 45 | Fill #1 | Status: AC

## 2023-02-12 MED FILL — Glucose Blood Test Strip: 100 days supply | Qty: 100 | Fill #0 | Status: AC

## 2023-02-13 ENCOUNTER — Other Ambulatory Visit: Payer: Self-pay

## 2023-02-13 MED ORDER — METOPROLOL SUCCINATE ER 25 MG PO TB24
25.0000 mg | ORAL_TABLET | Freq: Every day | ORAL | 3 refills | Status: DC
  Filled 2023-02-13: qty 90, 90d supply, fill #0

## 2023-02-14 ENCOUNTER — Other Ambulatory Visit: Payer: Self-pay

## 2023-02-15 ENCOUNTER — Ambulatory Visit: Payer: Medicaid Other | Attending: Cardiology | Admitting: Cardiology

## 2023-02-15 ENCOUNTER — Encounter: Payer: Self-pay | Admitting: Cardiology

## 2023-02-15 VITALS — BP 108/72 | HR 84 | Ht 73.0 in | Wt 282.0 lb

## 2023-02-15 DIAGNOSIS — E785 Hyperlipidemia, unspecified: Secondary | ICD-10-CM | POA: Diagnosis not present

## 2023-02-15 DIAGNOSIS — I255 Ischemic cardiomyopathy: Secondary | ICD-10-CM

## 2023-02-15 DIAGNOSIS — I2510 Atherosclerotic heart disease of native coronary artery without angina pectoris: Secondary | ICD-10-CM

## 2023-02-15 DIAGNOSIS — I5042 Chronic combined systolic (congestive) and diastolic (congestive) heart failure: Secondary | ICD-10-CM

## 2023-02-15 NOTE — Patient Instructions (Signed)
Medication Instructions:  Your physician recommends that you continue on your current medications as directed. Please refer to the Current Medication list given to you today.  *If you need a refill on your cardiac medications before your next appointment, please call your pharmacy*  Lab Work: -None ordered If you have labs (blood work) drawn today and your tests are completely normal, you will receive your results only by: MyChart Message (if you have MyChart) OR A paper copy in the mail If you have any lab test that is abnormal or we need to change your treatment, we will call you to review the results.  Testing/Procedures: -None ordered  Follow-Up: At [facility], you and your health needs are our priority.  As part of our continuing mission to provide you with exceptional heart care, we have created designated Provider Care Teams.  These Care Teams include your primary Cardiologist (physician) and Advanced Practice Providers (APPs -  Physician Assistants and Nurse Practitioners) who all work together to provide you with the care you need, when you need it.  Your next appointment:   6 month(s)  Provider:   You may see Debbe Odea, MD or one of the following Advanced Practice Providers on your designated Care Team:   Nicolasa Ducking, NP Eula Listen, PA-C Cadence Fransico Michael, PA-C Charlsie Quest, NP    Other Instructions - Schedule VAS [place] ABI WITH/WO TBI from heart failure clinic

## 2023-02-15 NOTE — Progress Notes (Signed)
Cardiology Office Note:    Date:  02/15/2023   ID:  Lawrence Murray, DOB 1966-05-08, MRN [id number]  PCP:  Lawrence Dies, NP   [facility] Providers Cardiologist:  Debbe Odea, MD Electrophysiologist:  Lanier Prude, MD     Referring MD: Lawrence Dies, NP   Chief Complaint  Patient presents with   Follow-up    Continued dyspnea on exertion.  Has not received results from echo on 01/17/23.      History of Present Illness:    Lawrence Murray is a 57 y.o. male with a hx of multivessel CAD (LHC 7/23- CTO D1, OM1, mid LAD, and ostial rPDA)  , HFrEF 30%, s/p ICD implant 1/24 obesity, former smoker x20+ years who presents for follow-up.  Patient's has lower extremity pain, followed up at heart failure clinic lower extremity ABI was ordered, has not performed this year.  Exercises 4 times weekly at the wellness center.  Denies palpitations, has occasional shortness of breath.  Denies edema, denies chest pain.  Compliant with all medications as prescribed, repeat echocardiogram was obtained 1 month ago showing no significant change from prior.  EF 25 to 30%   Prior notes/studies Echo 5/24 EF 25 to 30% Echo 11/23 EF 30% CMR 02/2022 EF 16%, nonviable anterior and apical wall, viable lateral and inferior walls.  Past Medical History:  Diagnosis Date   CHF (congestive heart failure) (HCC)    Coronary artery disease    Diabetes mellitus without complication (HCC)    GERD (gastroesophageal reflux disease)    Hypertension    Neuropathy     Past Surgical History:  Procedure Laterality Date   COLONOSCOPY WITH PROPOFOL N/A 06/14/2022   Procedure: COLONOSCOPY WITH PROPOFOL;  Surgeon: Wyline Mood, MD;  Location: [facility] ENDOSCOPY;  Service: Gastroenterology;  Laterality: N/A;   ICD IMPLANT N/A 09/19/2022   Procedure: ICD IMPLANT;  Surgeon: Lanier Prude, MD;  Location: [facility] INVASIVE CV LAB;  Service: Cardiovascular;  Laterality: N/A;   LOWER EXTREMITY ANGIOGRAPHY Left  02/14/2022   Procedure: Lower Extremity Angiography;  Surgeon: Annice Needy, MD;  Location: [facility] INVASIVE CV LAB;  Service: Cardiovascular;  Laterality: Left;   RIGHT/LEFT HEART CATH AND CORONARY ANGIOGRAPHY N/A 02/21/2022   Procedure: RIGHT/LEFT HEART CATH AND CORONARY ANGIOGRAPHY;  Surgeon: Yvonne Kendall, MD;  Location: [facility] INVASIVE CV LAB;  Service: Cardiovascular;  Laterality: N/A;    Current Medications: Current Meds  Medication Sig   Accu-Chek Softclix Lancets lancets Use as directed to check blood glucose daily   acetaminophen (TYLENOL) 500 MG tablet Take 2 tablets (1,000 mg total) by mouth every 6 (six) hours as needed for mild pain, moderate pain, fever or headache.   ALPRAZolam (XANAX) 1 MG tablet Take 1/2  tablet (0.5 mg total) by mouth 3 (three) times daily as needed for anxiety.   aspirin EC 81 MG tablet Take 1 tablet (81 mg total) by mouth daily. Swallow whole.   atorvastatin (LIPITOR) 80 MG tablet Take 1 tablet (80 mg total) by mouth daily.   Blood Glucose Monitoring Suppl (ACCU-CHEK GUIDE ME) w/Device KIT Use as directed daily   dapagliflozin propanediol (FARXIGA) 10 MG TABS tablet Take one tablet by mouth daily   digoxin (LANOXIN) 0.125 MG tablet Take 1 tablet (0.125 mg total) by mouth daily.   docusate sodium (STOOL SOFTENER) 250 MG capsule Take 250 mg by mouth daily.   furosemide (LASIX) 40 MG tablet Take 1.5 tablets (60 mg total) by mouth daily.   gabapentin (NEURONTIN) 300  MG capsule Take 1 capsule (300 mg total) by mouth 3 (three) times daily.   glucose blood (ACCU-CHEK GUIDE) test strip Use as directed once daily   hydrOXYzine (ATARAX) 10 MG tablet Take 1 tablet (10 mg total) by mouth at bedtime.   losartan (COZAAR) 25 MG tablet Take 1 tablet (25 mg total) by mouth at bedtime.   metFORMIN (GLUCOPHAGE) 1000 MG tablet Take 1 tablet (1,000 mg total) by mouth 2 (two) times daily with a meal.   metoprolol succinate (TOPROL-XL) 25 MG 24 hr tablet Take 1 tablet (25 mg total)  by mouth daily.   pantoprazole (PROTONIX) 40 MG tablet Take 1 tablet (40 mg total) by mouth daily.   potassium chloride SA (KLOR-CON M) 20 MEQ tablet Take 1 tablet (20 mEq total) by mouth daily.   spironolactone (ALDACTONE) 25 MG tablet Take 0.5 tablets (12.5 mg total) by mouth daily.     Allergies:   Patient has no known allergies.   Social History   Socioeconomic History   Marital status: Single    Spouse name: Not on file   Number of children: Not on file   Years of education: Not on file   Highest education level: 12th grade  Occupational History   Not on file  Tobacco Use   Smoking status: Former    Packs/day: 1.00    Years: 32.00    Additional pack years: 0.00    Total pack years: 32.00    Types: Cigarettes    Quit date: 02/04/2022    Years since quitting: 1.0   Smokeless tobacco: Never  Vaping Use   Vaping Use: Not on file  Substance and Sexual Activity   Alcohol use: Not Currently    Comment: 3 beer in last 6 month   Drug use: Never   Sexual activity: Not Currently  Other Topics Concern   Not on file  Social History Narrative   Not on file   Social Determinants of Health   Financial Resource Strain: High Risk (12/31/2022)   Overall Financial Resource Strain (CARDIA)    Difficulty of Paying Living Expenses: Very hard  Food Insecurity: No Food Insecurity (12/31/2022)   Hunger Vital Sign    Worried About Running Out of Food in the Last Year: Never true    Ran Out of Food in the Last Year: Never true  Transportation Needs: Unmet Transportation Needs (12/31/2022)   PRAPARE - Transportation    Lack of Transportation (Medical): Yes    Lack of Transportation (Non-Medical): Yes  Physical Activity: Sufficiently Active (12/31/2022)   Exercise Vital Sign    Days of Exercise per Week: 4 days    Minutes of Exercise per Session: 90 min  Stress: Stress Concern Present (12/31/2022)   [employer] of Occupational Health - Occupational Stress Questionnaire    Feeling of  Stress : Very much  Social Connections: Moderately Isolated (12/31/2022)   Social Connection and Isolation Panel [NHANES]    Frequency of Communication with Friends and Family: Twice a week    Frequency of Social Gatherings with Friends and Family: Once a week    Attends Religious Services: 1 to 4 times per year    Active Member of [employer] or Organizations: No    Attends Engineer, structural: Not on file    Marital Status: Never married     Family History: The patient's family history includes Congestive Heart Failure in his father.  ROS:   Please see the history of present illness.  All other systems reviewed and are negative.  EKGs/Labs/Other Studies Reviewed:    The following studies were reviewed today:   EKG:  EKG not ordered today  Recent Labs: 09/06/2022: Hemoglobin 14.4; Platelets 159 10/17/2022: B Natriuretic Peptide 66.6; BUN 28; Creatinine, Ser 1.29; Potassium 4.3; Sodium 134  Recent Lipid Panel    Component Value Date/Time   CHOL 195 10/17/2022 1428   TRIG 439 (H) 10/17/2022 1428   HDL 33 (L) 10/17/2022 1428   CHOLHDL 5.9 10/17/2022 1428   VLDL UNABLE TO CALCULATE IF TRIGLYCERIDE OVER 400 mg/dL 40/98/1191 4782   LDLCALC UNABLE TO CALCULATE IF TRIGLYCERIDE OVER 400 mg/dL 95/62/1308 6578   LDLDIRECT 91 10/17/2022 1428     Risk Assessment/Calculations:             Physical Exam:    VS:  BP 108/72 (BP Location: Left Arm, Patient Position: Sitting, Cuff Size: Large)   Pulse 84   Ht 6\' 1"  (1.854 m)   Wt 282 lb (127.9 kg)   SpO2 97%   BMI 37.21 kg/m     Wt Readings from Last 3 Encounters:  02/15/23 282 lb (127.9 kg)  01/03/23 282 lb 9.6 oz (128.2 kg)  12/19/22 276 lb (125.2 kg)     GEN:  Well nourished, well developed in no acute distress HEENT: Normal NECK: No JVD; No carotid bruits CARDIAC: RRR, no murmurs, rubs, gallops RESPIRATORY:  Clear to auscultation without rales, wheezing or rhonchi  ABDOMEN: Soft, non-tender,  distended MUSCULOSKELETAL:  No edema; No deformity  SKIN: Warm and dry NEUROLOGIC:  Alert and oriented x 3 PSYCHIATRIC:  Normal affect   ASSESSMENT:    1. Ischemic cardiomyopathy   2. Coronary artery disease involving native coronary artery of native heart, unspecified whether angina present   3. Hyperlipidemia, unspecified hyperlipidemia type   4. Chronic combined systolic and diastolic congestive heart failure (HCC)     PLAN:    In order of problems listed above:  Ischemic cardiomyopathy EF 30% s/p ICD.  No edema, clear lungs.  Describes NYHA class II-III symptoms.  Continue losartan 25 mg daily, Aldactone 12.5 mg daily.  Farxiga, digoxin, Lasix 60 mg daily.  Low BP preventing up titration of GDMT.  Appreciate input from heart failure service. Multivessel CAD CAD (CTO D1, OM1, mid LAD, and ostial rPDA).  Not amenable to revascularization.  Denies chest pain.  Continue aspirin, Lipitor 80, Toprol-XL. Hyperlipidemia.  Continue Lipitor 80 mg daily. S/p ICD, follow-up with device clinic for frequent checks.  Follow-up in 6 months      Medication Adjustments/Labs and Tests Ordered: Current medicines are reviewed at length with the patient today.  Concerns regarding medicines are outlined above.  Orders Placed This Encounter  Procedures   VAS [place] ABI WITH/WO TBI   No orders of the defined types were placed in this encounter.   Patient Instructions  Medication Instructions:  Your physician recommends that you continue on your current medications as directed. Please refer to the Current Medication list given to you today.  *If you need a refill on your cardiac medications before your next appointment, please call your pharmacy*  Lab Work: -None ordered If you have labs (blood work) drawn today and your tests are completely normal, you will receive your results only by: MyChart Message (if you have MyChart) OR A paper copy in the mail If you have any lab test that is abnormal or  we need to change your treatment, we will call you to review the  results.  Testing/Procedures: -None ordered  Follow-Up: At [facility], you and your health needs are our priority.  As part of our continuing mission to provide you with exceptional heart care, we have created designated Provider Care Teams.  These Care Teams include your primary Cardiologist (physician) and Advanced Practice Providers (APPs -  Physician Assistants and Nurse Practitioners) who all work together to provide you with the care you need, when you need it.  Your next appointment:   6 month(s)  Provider:   You may see Debbe Odea, MD or one of the following Advanced Practice Providers on your designated Care Team:   Nicolasa Ducking, NP Eula Listen, PA-C Cadence Fransico Michael, PA-C Charlsie Quest, NP    Other Instructions - Schedule VAS [place] ABI WITH/WO TBI from heart failure clinic   Signed, Debbe Odea, MD  02/15/2023 4:27 PM    [facility]

## 2023-03-13 ENCOUNTER — Other Ambulatory Visit: Payer: Self-pay | Admitting: Nurse Practitioner

## 2023-03-13 DIAGNOSIS — G4700 Insomnia, unspecified: Secondary | ICD-10-CM

## 2023-03-14 ENCOUNTER — Other Ambulatory Visit: Payer: Self-pay

## 2023-03-14 ENCOUNTER — Ambulatory Visit: Payer: Medicaid Other | Admitting: Cardiology

## 2023-03-14 ENCOUNTER — Other Ambulatory Visit: Payer: Self-pay | Admitting: Nurse Practitioner

## 2023-03-14 ENCOUNTER — Other Ambulatory Visit
Admission: RE | Admit: 2023-03-14 | Discharge: 2023-03-14 | Disposition: A | Discharge status: Home / Self Care | Payer: Medicaid Other | Source: Ambulatory Visit | Attending: Cardiology | Admitting: Cardiology

## 2023-03-14 VITALS — BP 117/70 | HR 82 | Ht 73.0 in | Wt 284.0 lb

## 2023-03-14 DIAGNOSIS — G4700 Insomnia, unspecified: Secondary | ICD-10-CM

## 2023-03-14 DIAGNOSIS — E669 Obesity, unspecified: Secondary | ICD-10-CM

## 2023-03-14 DIAGNOSIS — I5042 Chronic combined systolic (congestive) and diastolic (congestive) heart failure: Secondary | ICD-10-CM | POA: Diagnosis not present

## 2023-03-14 DIAGNOSIS — E119 Type 2 diabetes mellitus without complications: Secondary | ICD-10-CM

## 2023-03-14 DIAGNOSIS — I2510 Atherosclerotic heart disease of native coronary artery without angina pectoris: Secondary | ICD-10-CM | POA: Diagnosis not present

## 2023-03-14 DIAGNOSIS — E785 Hyperlipidemia, unspecified: Secondary | ICD-10-CM

## 2023-03-14 DIAGNOSIS — I255 Ischemic cardiomyopathy: Secondary | ICD-10-CM | POA: Diagnosis not present

## 2023-03-14 LAB — BASIC METABOLIC PANEL
Anion gap: 7 (ref 5–15)
BUN: 23 mg/dL — ABNORMAL HIGH (ref 6–20)
CO2: 26 mmol/L (ref 22–32)
Calcium: 9.6 mg/dL (ref 8.9–10.3)
Chloride: 104 mmol/L (ref 98–111)
Creatinine, Ser: 1.3 mg/dL — ABNORMAL HIGH (ref 0.61–1.24)
GFR, Estimated: >60 mL/min (ref >60)
Glucose, Bld: 222 mg/dL — ABNORMAL HIGH (ref 70–99)
Potassium: 5 mmol/L (ref 3.5–5.1)
Sodium: 137 mmol/L (ref 135–145)

## 2023-03-14 LAB — DIGOXIN LEVEL: Digoxin Level: 0.8 ng/mL (ref 0.8–2.0)

## 2023-03-14 LAB — BRAIN NATRIURETIC PEPTIDE: B Natriuretic Peptide: 123.6 pg/mL — ABNORMAL HIGH (ref 0.0–100.0)

## 2023-03-14 MED ORDER — METOPROLOL SUCCINATE ER 50 MG PO TB24
50.0000 mg | ORAL_TABLET | Freq: Every day | ORAL | 1 refills | Status: DC
  Filled 2023-03-14 – 2023-03-15 (×2): qty 90, 90d supply, fill #0
  Filled 2023-06-10: qty 90, 90d supply, fill #1

## 2023-03-14 MED ORDER — METOPROLOL SUCCINATE ER 25 MG PO TB24
37.5000 mg | ORAL_TABLET | Freq: Every day | ORAL | 0 refills | Status: DC
  Filled 2023-03-14 – 2023-03-15 (×3): qty 21, 14d supply, fill #0

## 2023-03-14 MED FILL — Hydroxyzine HCl Tab 10 MG: ORAL | 30 days supply | Qty: 30 | Fill #0 | Status: AC

## 2023-03-14 MED FILL — Alprazolam Tab 1 MG: ORAL | 30 days supply | Qty: 45 | Fill #0 | Status: AC

## 2023-03-14 NOTE — Patient Instructions (Addendum)
Take Metoprolol 37.5 MG daily for 2 weeks only  Then after your 2 weeks are up, start Metoprolol 50 MG daily  Follow up in 3 months with Dr. Gasper Lloyd  2 referrals were placed today for Ozempic and Repatha. You may get a call within a week. If not, call their office. The numbers will be on this AVS.

## 2023-03-14 NOTE — Progress Notes (Signed)
ADVANCED HEART FAILURE CLINIC NOTE  Referring Physician: Kara Dies, NP  Primary Care: Kara Dies, NP Primary Cardiologist: Debbe Odea, MD  HPI: Lawrence Murray is a 57 y.o. male with HFrEF 2/2 ischemic cardiomyopathy s/p ICD (09/19/22), CAD, obesity, hx of tobacco use. Mr. Keenum' cardiac history dates back to June 2023 when he was admitted for acute heart failure exacerbation.  He had an echocardiogram at that time demonstrating an LVEF of 30 to 35%.  He had a right and left heart cath that showed severe three-vessel coronary artery disease including CTO of the first diagonal, OM1, mid LAD and ostial RPDA.  He had a cardiac MRI with LVEF of 16% and severely reduced RV systolic function.  Unfortunately coronary anatomy was not amenable to revascularization.  He had a repeat echo in November 2023 that showed an LVEF of 30% with now normal RV systolic function.  Since that time he has undergone ICD placement by Dr. Lalla Brothers.  Interval hx:  - His functional status has been fairly stable lately; no recent hospitalizations for HFrEF. He is attempting to work out and lose weight but is limited fatigue & chest pain.   Activity level/exercise tolerance:  NYHA IIB-III Orthopnea:  Sleeps on 2-3 pillows; previously sleeping inclined at 90 degrees Paroxysmal noctural dyspnea:  No, minimal Chest pain/pressure:  no Orthostatic lightheadedness:  no Palpitations:  no Lower extremity edema:  no Presyncope/syncope:  no Cough:  no  Past Medical History:  Diagnosis Date   CHF (congestive heart failure) (HCC)    Coronary artery disease    Diabetes mellitus without complication (HCC)    GERD (gastroesophageal reflux disease)    Hypertension    Neuropathy     Current Outpatient Medications  Medication Sig Dispense Refill   Accu-Chek Softclix Lancets lancets Use as directed to check blood glucose daily 100 each 0   acetaminophen (TYLENOL) 500 MG tablet Take 2 tablets (1,000 mg total)  by mouth every 6 (six) hours as needed for mild pain, moderate pain, fever or headache. 30 tablet 0   ALPRAZolam (XANAX) 1 MG tablet Take 1/2  tablet (0.5 mg total) by mouth 3 (three) times daily as needed for anxiety. 90 tablet 0   aspirin EC 81 MG tablet Take 1 tablet (81 mg total) by mouth daily. Swallow whole. 90 tablet 2   atorvastatin (LIPITOR) 80 MG tablet Take 1 tablet (80 mg total) by mouth daily. 90 tablet 2   Blood Glucose Monitoring Suppl (ACCU-CHEK GUIDE ME) w/Device KIT Use as directed daily 1 kit 0   dapagliflozin propanediol (FARXIGA) 10 MG TABS tablet Take one tablet by mouth daily 90 tablet 3   digoxin (LANOXIN) 0.125 MG tablet Take 1 tablet (0.125 mg total) by mouth daily. 90 tablet 2   docusate sodium (STOOL SOFTENER) 250 MG capsule Take 250 mg by mouth daily.     furosemide (LASIX) 40 MG tablet Take 1.5 tablets (60 mg total) by mouth daily. 135 tablet 3   gabapentin (NEURONTIN) 300 MG capsule Take 1 capsule (300 mg total) by mouth 3 (three) times daily. 90 capsule 3   glucose blood (ACCU-CHEK GUIDE) test strip Use as directed once daily 100 each 0   hydrOXYzine (ATARAX) 10 MG tablet Take 1 tablet (10 mg total) by mouth at bedtime. 30 tablet 1   losartan (COZAAR) 25 MG tablet Take 1 tablet (25 mg total) by mouth at bedtime. 30 tablet 3   metFORMIN (GLUCOPHAGE) 1000 MG tablet Take 1 tablet (1,000 mg  total) by mouth 2 (two) times daily with a meal. 180 tablet 2   metoprolol succinate (TOPROL-XL) 25 MG 24 hr tablet Take 1 tablet (25 mg total) by mouth daily. 90 tablet 3   pantoprazole (PROTONIX) 40 MG tablet Take 1 tablet (40 mg total) by mouth daily. 30 tablet 11   potassium chloride SA (KLOR-CON M) 20 MEQ tablet Take 1 tablet (20 mEq total) by mouth daily. 90 tablet 2   spironolactone (ALDACTONE) 25 MG tablet Take 0.5 tablets (12.5 mg total) by mouth daily. 45 tablet 2   No current facility-administered medications for this visit.    No Known Allergies    Social History    Socioeconomic History   Marital status: Single    Spouse name: Not on file   Number of children: Not on file   Years of education: Not on file   Highest education level: 12th grade  Occupational History   Not on file  Tobacco Use   Smoking status: Former    Current packs/day: 0.00    Average packs/day: 1 pack/day for 32.0 years (32.0 ttl pk-yrs)    Types: Cigarettes    Start date: 02/04/1990    Quit date: 02/04/2022    Years since quitting: 1.1   Smokeless tobacco: Never  Vaping Use   Vaping status: Not on file  Substance and Sexual Activity   Alcohol use: Not Currently    Comment: 3 beer in last 6 month   Drug use: Never   Sexual activity: Not Currently  Other Topics Concern   Not on file  Social History Narrative   Not on file   Social Determinants of Health   Financial Resource Strain: High Risk (12/31/2022)   Overall Financial Resource Strain (CARDIA)    Difficulty of Paying Living Expenses: Very hard  Food Insecurity: No Food Insecurity (12/31/2022)   Hunger Vital Sign    Worried About Running Out of Food in the Last Year: Never true    Ran Out of Food in the Last Year: Never true  Transportation Needs: Unmet Transportation Needs (12/31/2022)   PRAPARE - Transportation    Lack of Transportation (Medical): Yes    Lack of Transportation (Non-Medical): Yes  Physical Activity: Sufficiently Active (12/31/2022)   Exercise Vital Sign    Days of Exercise per Week: 4 days    Minutes of Exercise per Session: 90 min  Stress: Stress Concern Present (12/31/2022)   [employer] of Occupational Health - Occupational Stress Questionnaire    Feeling of Stress : Very much  Social Connections: Moderately Isolated (12/31/2022)   Social Connection and Isolation Panel [NHANES]    Frequency of Communication with Friends and Family: Twice a week    Frequency of Social Gatherings with Friends and Family: Once a week    Attends Religious Services: 1 to 4 times per year    Active  Member of [employer] or Organizations: No    Attends Engineer, structural: Not on file    Marital Status: Never married  Catering manager Violence: Not on file      Family History  Problem Relation Age of Onset   Congestive Heart Failure Father     PHYSICAL EXAM: Vitals:   03/14/23 1538  BP: 117/70  Pulse: 82  SpO2: 98%   GENERAL: Well nourished, well developed, and in no apparent distress at rest.  HEENT: Negative for arcus senilis or xanthelasma. There is no scleral icterus.  The mucous membranes are pink and moist.  NECK: Supple, No masses. Normal carotid upstrokes without bruits. No masses or thyromegaly.    CHEST: There are no chest wall deformities. There is no chest wall tenderness. Respirations are unlabored.  Lungs- CTA B/L CARDIAC:  JVP: 8 cm          Normal rate with regular rhythm. No murmurs, rubs or gallops.  Pulses are 2+ and symmetrical in upper and lower extremities. No edema.  ABDOMEN: Soft, non-tender, non-distended. There are no masses or hepatomegaly. There are normal bowel sounds.  EXTREMITIES: Warm and well perfused with no cyanosis, clubbing.  LYMPHATIC: No axillary or supraclavicular lymphadenopathy.  NEUROLOGIC: Patient is oriented x3 with no focal or lateralizing neurologic deficits.  PSYCH: Patients affect is appropriate, there is no evidence of anxiety or depression.  SKIN: Warm and dry; no lesions or wounds.    DATA REVIEW  ECG: 08/31/22: NSR w/ old anterior infarct.   ECHO: 07/19/22: LVEF 30%, normal RV function 02/06/22: LVEF 30-35%, RV not well visualized.  01/17/23: LVEF 25%-30%, normal RV function.   CATH: Severe three-vessel coronary artery disease, including chronic total/subtotal occlusions of large D1 and OM1 branches, mid LAD, and ostial rPDA.  There is also moderate-severe disease involving the proximal and distal LCx as well as the distal RCA. Moderately elevated left heart, right heart, and pulmonary artery pressures (LVEDP 32  mmHg, PCWP 30 mmHg, mean RA 9 mmHg, RVEDP 14 mmHg, and mean PAP 37 mmHg). Low normal Fick cardiac output/index (CO 5.7 L/min, CI 2.5 L/min/m^2).  CMR 02/23/22:  1.  Severely reduced Bi-ventricular function.  LVEF 16%, RVEF 19%.  2. There is transmural subendocardial scar in the LV anterior wall and apex (LAD territory), indicating non viability.  3.  There is 25-50% subendocardial LGE/scar in the LV lateral wall.  4. The LV lateral and inferior walls appear viable (RCA and Lcx territory).  5.  No significant valvular abnormalities.  6. Findings consistent with ischemic cardiomyopathy with viable LV lateral and inferior walls. Non-viable LV anterior and apical wall.  ABI 08/15/22:  Right: Resting right ankle-brachial index is within normal range. The  right toe-brachial index is normal.  Left: Resting left ankle-brachial index is within normal range. The left  toe-brachial index is normal.    ASSESSMENT & PLAN:  Heart Failure with reduced EF Etiology of ZO:XWRUEAVW cardiomyopathy, severe multivessel CAD as noted above .  NYHA class / AHA Stage:III Volume status & Diuretics: Euvolemic to mildly hypervolemic; lasix 60mg  daily Vasodilators: increase losartan to 25mg  qHS.  Beta-Blocker:digoxin 0.125, Increase toprol to 37.5mg  at bedtime for the next 2 weeks then increase to 50mg  daily.  UJW:[id number] 12.5mg  daaily Cardiometabolic:farxiga 10mg  daily Devices therapies & Valvulopathies:primary prevention ICD by Dr. Lalla Brothers Advanced therapies:Mr. Cuccia will very likely require advanced therapies at some point. His CMR demonstrates transmural scar of the anterior LV myocardium with severely reduced function. At this point, his weight, frequent panic attacks and motivation / insight are the biggest limiting factors. For the time being will continue to slowly uptitrate medical therapy.   2. CAD - Severe multivessel CAD as noted above - LDL remains above goal; refer to lipid clinic for  Repatha - Lipitor 80mg , ASA 81mg  daily   3. T2DM - Metformin - Continue farxiga - A1C 10.8 (01/03/23), up from 7.9 in 08/30/22.  - Refer to start Ozempic  4. Obesity  - BMI 35 - Working on weight loss; discussed at length today. Will refer to start ozempic.  5. Hyperlipidemia  - Prior lipid  panel with severely elevated triglycerides; unable to calculate LDL. Will repeat fasting lipid panel and refer to lipid clinic.  - lipitor 80mg  daily   6. PAD - CT angiogram from June 2023 with RLE anterior tibial artery occlusion. Left lower extremity only has one vessel runoff. He had a follow up angiogram that  -  Most recent ABIs in December 2023 normal.  - ABI scheduled for 04/04/23.   Kolina Kube Advanced Heart Failure Mechanical Circulatory Support

## 2023-03-15 ENCOUNTER — Other Ambulatory Visit: Payer: Self-pay

## 2023-03-18 ENCOUNTER — Other Ambulatory Visit: Payer: Self-pay

## 2023-03-20 ENCOUNTER — Encounter (INDEPENDENT_AMBULATORY_CARE_PROVIDER_SITE_OTHER): Payer: Self-pay

## 2023-03-20 ENCOUNTER — Other Ambulatory Visit: Payer: Self-pay

## 2023-03-21 ENCOUNTER — Ambulatory Visit (INDEPENDENT_AMBULATORY_CARE_PROVIDER_SITE_OTHER): Payer: Medicaid Other

## 2023-03-21 DIAGNOSIS — I255 Ischemic cardiomyopathy: Secondary | ICD-10-CM

## 2023-03-21 LAB — CUP PACEART REMOTE DEVICE CHECK
Battery Remaining Longevity: 180 mo
Battery Remaining Percentage: 100 %
Brady Statistic RV Percent Paced: 0 %
Date Time Interrogation Session: 20240801035100
HighPow Impedance: 72 Ohm
Implantable Lead Connection Status: 753985
Implantable Lead Implant Date: 20240131
Implantable Lead Location: 753860
Implantable Lead Model: 673
Implantable Lead Serial Number: 204314
Implantable Pulse Generator Implant Date: 20240131
Lead Channel Impedance Value: 556 Ohm
Lead Channel Pacing Threshold Amplitude: 0.7 V
Lead Channel Pacing Threshold Pulse Width: 0.4 ms
Lead Channel Setting Pacing Amplitude: 2 V
Lead Channel Setting Pacing Pulse Width: 0.4 ms
Lead Channel Setting Sensing Sensitivity: 0.5 mV
Pulse Gen Serial Number: 322340

## 2023-03-26 ENCOUNTER — Other Ambulatory Visit: Payer: Self-pay | Admitting: Cardiology

## 2023-03-26 DIAGNOSIS — I2510 Atherosclerotic heart disease of native coronary artery without angina pectoris: Secondary | ICD-10-CM

## 2023-03-26 DIAGNOSIS — I5042 Chronic combined systolic (congestive) and diastolic (congestive) heart failure: Secondary | ICD-10-CM

## 2023-03-26 DIAGNOSIS — E785 Hyperlipidemia, unspecified: Secondary | ICD-10-CM

## 2023-03-26 DIAGNOSIS — I255 Ischemic cardiomyopathy: Secondary | ICD-10-CM

## 2023-03-26 DIAGNOSIS — M79604 Pain in right leg: Secondary | ICD-10-CM

## 2023-04-02 NOTE — Progress Notes (Signed)
Remote ICD transmission.   

## 2023-04-04 ENCOUNTER — Ambulatory Visit: Payer: Medicaid Other | Attending: Cardiology

## 2023-04-04 DIAGNOSIS — M79604 Pain in right leg: Secondary | ICD-10-CM | POA: Diagnosis not present

## 2023-04-04 DIAGNOSIS — M79605 Pain in left leg: Secondary | ICD-10-CM

## 2023-04-04 LAB — VAS US ABI WITH/WO TBI
Left ABI: 1.19
Right ABI: 1.14

## 2023-04-09 ENCOUNTER — Ambulatory Visit: Payer: Medicaid Other | Admitting: Nurse Practitioner

## 2023-04-09 ENCOUNTER — Encounter: Payer: Self-pay | Admitting: Nurse Practitioner

## 2023-04-09 ENCOUNTER — Other Ambulatory Visit
Admission: RE | Admit: 2023-04-09 | Discharge: 2023-04-09 | Disposition: A | Discharge status: Home / Self Care | Payer: Medicaid Other | Attending: Nurse Practitioner | Admitting: Nurse Practitioner

## 2023-04-09 VITALS — BP 118/72 | HR 78 | Temp 97.8°F | Ht 73.0 in | Wt 274.8 lb

## 2023-04-09 DIAGNOSIS — I5042 Chronic combined systolic (congestive) and diastolic (congestive) heart failure: Secondary | ICD-10-CM

## 2023-04-09 DIAGNOSIS — E119 Type 2 diabetes mellitus without complications: Secondary | ICD-10-CM

## 2023-04-09 DIAGNOSIS — E1165 Type 2 diabetes mellitus with hyperglycemia: Secondary | ICD-10-CM

## 2023-04-09 DIAGNOSIS — G6289 Other specified polyneuropathies: Secondary | ICD-10-CM

## 2023-04-09 DIAGNOSIS — E785 Hyperlipidemia, unspecified: Secondary | ICD-10-CM

## 2023-04-09 DIAGNOSIS — Z7984 Long term (current) use of oral hypoglycemic drugs: Secondary | ICD-10-CM

## 2023-04-09 LAB — HEMOGLOBIN A1C
Hgb A1c MFr Bld: 8.8 % — ABNORMAL HIGH (ref 4.8–5.6)
Mean Plasma Glucose: 205.86 mg/dL

## 2023-04-09 LAB — LIPID PANEL
Cholesterol: 149 mg/dL (ref 0–200)
HDL: 25 mg/dL — ABNORMAL LOW (ref >40)
LDL Cholesterol: 55 mg/dL (ref 0–99)
Total CHOL/HDL Ratio: 6 RATIO
Triglycerides: 343 mg/dL — ABNORMAL HIGH (ref <150)
VLDL: 69 mg/dL — ABNORMAL HIGH (ref 0–40)

## 2023-04-09 NOTE — Progress Notes (Signed)
Established Patient Office Visit  Subjective:  Patient ID: Lawrence Murray, male    DOB: 11/15/1965  Age: 57 y.o. MRN: [id number]  CC:  Chief Complaint  Patient presents with   Medical Management of Chronic Issues    HPI  [name] presents for chronic disease management.  He has history of diabetes, multivessel CAD, congestive heart failure, s/p ICD implant, PVD, hyperlipidemia.  He is followed by cardiology, vein and vascular.   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  Patient states that it is difficult for him to take care of his father, himself and his son who is in [employer].    Patient has financial and transportation limitations.  Patient states that he is doing his best he could with exercising to maintain his health level and eating healthy.  HPI   Past Medical History:  Diagnosis Date   CHF (congestive heart failure) (HCC)    Coronary artery disease    Diabetes mellitus without complication (HCC)    GERD (gastroesophageal reflux disease)    Hypertension    Neuropathy     Past Surgical History:  Procedure Laterality Date   COLONOSCOPY WITH PROPOFOL N/A 06/14/2022   Procedure: COLONOSCOPY WITH PROPOFOL;  Surgeon: Wyline Mood, MD;  Location: [facility] ENDOSCOPY;  Service: Gastroenterology;  Laterality: N/A;   ICD IMPLANT N/A 09/19/2022   Procedure: ICD IMPLANT;  Surgeon: Lanier Prude, MD;  Location: [facility] INVASIVE CV LAB;  Service: Cardiovascular;  Laterality: N/A;   LOWER EXTREMITY ANGIOGRAPHY Left 02/14/2022   Procedure: Lower Extremity Angiography;  Surgeon: Annice Needy, MD;  Location: [facility] INVASIVE CV LAB;  Service: Cardiovascular;  Laterality: Left;   RIGHT/LEFT HEART CATH AND CORONARY ANGIOGRAPHY N/A 02/21/2022   Procedure: RIGHT/LEFT HEART CATH AND CORONARY ANGIOGRAPHY;  Surgeon: Yvonne Kendall, MD;  Location: [facility] INVASIVE CV LAB;  Service: Cardiovascular;  Laterality: N/A;    Family History   Problem Relation Age of Onset   Congestive Heart Failure Father     Social History   Socioeconomic History   Marital status: Single    Spouse name: Not on file   Number of children: Not on file   Years of education: Not on file   Highest education level: 12th grade  Occupational History   Not on file  Tobacco Use   Smoking status: Former    Current packs/day: 0.00    Average packs/day: 1 pack/day for 32.0 years (32.0 ttl pk-yrs)    Types: Cigarettes    Start date: 02/04/1990    Quit date: 02/04/2022    Years since quitting: 1.2   Smokeless tobacco: Never  Vaping Use   Vaping status: Not on file  Substance and Sexual Activity   Alcohol use: Not Currently    Comment: 3 beer in last 6 month   Drug use: Never   Sexual activity: Not Currently  Other Topics Concern   Not on file  Social History Narrative   Not on file   Social Determinants of Health   Financial Resource Strain: High Risk (12/31/2022)   Overall Financial Resource Strain (CARDIA)    Difficulty of Paying Living Expenses: Very hard  Food Insecurity: No Food Insecurity (12/31/2022)   Hunger Vital Sign    Worried About Running Out of Food in the Last Year: Never true    Ran Out of Food in the Last Year: Never true  Transportation Needs: Unmet Transportation Needs (12/31/2022)  PRAPARE - Administrator, Civil Service (Medical): Yes    Lack of Transportation (Non-Medical): Yes  Physical Activity: Sufficiently Active (12/31/2022)   Exercise Vital Sign    Days of Exercise per Week: 4 days    Minutes of Exercise per Session: 90 min  Stress: Stress Concern Present (12/31/2022)   [employer] of Occupational Health - Occupational Stress Questionnaire    Feeling of Stress : Very much  Social Connections: Moderately Isolated (12/31/2022)   Social Connection and Isolation Panel [NHANES]    Frequency of Communication with Friends and Family: Twice a week    Frequency of Social Gatherings with Friends and  Family: Once a week    Attends Religious Services: 1 to 4 times per year    Active Member of [employer] or Organizations: No    Attends Engineer, structural: Not on file    Marital Status: Never married  Intimate Partner Violence: Not on file     Outpatient Medications Prior to Visit  Medication Sig Dispense Refill   Accu-Chek Softclix Lancets lancets Use as directed to check blood glucose daily 100 each 0   acetaminophen (TYLENOL) 500 MG tablet Take 2 tablets (1,000 mg total) by mouth every 6 (six) hours as needed for mild pain, moderate pain, fever or headache. 30 tablet 0   ALPRAZolam (XANAX) 1 MG tablet Take 1/2  tablet (0.5 mg total) by mouth 3 (three) times daily as needed for anxiety. 90 tablet 0   aspirin EC 81 MG tablet Take 1 tablet (81 mg total) by mouth daily. Swallow whole. 90 tablet 2   Blood Glucose Monitoring Suppl (ACCU-CHEK GUIDE ME) w/Device KIT Use as directed daily 1 kit 0   dapagliflozin propanediol (FARXIGA) 10 MG TABS tablet Take one tablet by mouth daily 90 tablet 3   docusate sodium (STOOL SOFTENER) 250 MG capsule Take 250 mg by mouth daily.     furosemide (LASIX) 40 MG tablet Take 1.5 tablets (60 mg total) by mouth daily. 135 tablet 3   gabapentin (NEURONTIN) 300 MG capsule Take 1 capsule (300 mg total) by mouth 3 (three) times daily. 90 capsule 3   glucose blood (ACCU-CHEK GUIDE) test strip Use as directed once daily 100 each 0   hydrOXYzine (ATARAX) 10 MG tablet Take 1 tablet (10 mg total) by mouth at bedtime. 30 tablet 1   metFORMIN (GLUCOPHAGE) 1000 MG tablet Take 1 tablet (1,000 mg total) by mouth 2 (two) times daily with a meal. 180 tablet 2   metoprolol succinate (TOPROL-XL) 25 MG 24 hr tablet Take 1.5 tablets (37.5 mg total) by mouth daily. Patient to take this dosage for 2 WEEKS. Then begin taking new rx 50 MG daily. 21 tablet 0   metoprolol succinate (TOPROL-XL) 50 MG 24 hr tablet Take 1 tablet (50 mg total) by mouth daily. Take with or immediately  following a meal. 90 tablet 1   pantoprazole (PROTONIX) 40 MG tablet Take 1 tablet (40 mg total) by mouth daily. 30 tablet 11   spironolactone (ALDACTONE) 25 MG tablet Take 0.5 tablets (12.5 mg total) by mouth daily. 45 tablet 2   atorvastatin (LIPITOR) 80 MG tablet Take 1 tablet (80 mg total) by mouth daily. 90 tablet 2   digoxin (LANOXIN) 0.125 MG tablet Take 1 tablet (0.125 mg total) by mouth daily. 90 tablet 2   losartan (COZAAR) 25 MG tablet Take 1 tablet (25 mg total) by mouth at bedtime. 30 tablet 3   potassium chloride SA (  KLOR-CON M) 20 MEQ tablet Take 1 tablet (20 mEq total) by mouth daily. 90 tablet 2   No facility-administered medications prior to visit.    No Known Allergies  ROS Review of Systems  Respiratory:  Positive for shortness of breath (With exertion).   Musculoskeletal:  Positive for arthralgias.  Neurological:  Positive for numbness (Bilaterally lower extremity).   Negative unless indicated in HPI.    Objective:    Physical Exam Constitutional:      Appearance: Normal appearance.  HENT:     Mouth/Throat:     Mouth: Mucous membranes are moist.  Eyes:     Conjunctiva/sclera: Conjunctivae normal.     Pupils: Pupils are equal, round, and reactive to light.  Cardiovascular:     Rate and Rhythm: Normal rate and regular rhythm.     Pulses: Normal pulses.     Heart sounds: Normal heart sounds.  Pulmonary:     Effort: Pulmonary effort is normal.     Breath sounds: Normal breath sounds.  Musculoskeletal:     Cervical back: Normal range of motion. No tenderness.     Right lower leg: No edema.     Left lower leg: No edema.  Skin:    General: Skin is warm.     Findings: No bruising.  Neurological:     General: No focal deficit present.     Mental Status: He is alert and oriented to person, place, and time. Mental status is at baseline.  Psychiatric:        Mood and Affect: Mood normal.        Behavior: Behavior normal.        Thought Content: Thought  content normal.        Judgment: Judgment normal.     BP 118/72   Pulse 78   Temp 97.8 F (36.6 C)   Ht 6\' 1"  (1.854 m)   Wt 274 lb 12.8 oz (124.6 kg)   SpO2 97%   BMI 36.26 kg/m  Wt Readings from Last 3 Encounters:  04/09/23 274 lb 12.8 oz (124.6 kg)  03/14/23 284 lb (128.8 kg)  02/15/23 282 lb (127.9 kg)     Health Maintenance  Topic Date Due   Diabetic kidney evaluation - Urine ACR  Never done   Zoster Vaccines- Shingrix (2 of 2) 08/03/2022   COVID-19 Vaccine (1 - 2023-24 season) Never done   Hepatitis C Screening  06/02/2023 (Originally 06[birth year]09/1985)   INFLUENZA VACCINE  11/18/2023 (Originally 03/21/2023)   FOOT EXAM  05/16/2023   OPHTHALMOLOGY EXAM  07/25/2023   Lung Cancer Screening  08/02/2023   HEMOGLOBIN A1C  10/10/2023   Diabetic kidney evaluation - eGFR measurement  03/13/2024   Colonoscopy  06/14/2029   DTaP/Tdap/Td (2 - Td or Tdap) 06/01/2032   HIV Screening  Completed   HPV VACCINES  Aged Out    There are no preventive care reminders to display for this patient.  Lab Results  Component Value Date   TSH 9.011 (H) 02/05/2022   Lab Results  Component Value Date   WBC 5.9 09/06/2022   HGB 14.4 09/06/2022   HCT 41.6 09/06/2022   MCV 99.5 09/06/2022   PLT 159 09/06/2022   Lab Results  Component Value Date   NA 137 03/14/2023   K 5.0 03/14/2023   CO2 26 03/14/2023   GLUCOSE 222 (H) 03/14/2023   BUN 23 (H) 03/14/2023   CREATININE 1.30 (H) 03/14/2023   BILITOT 1.0 02/11/2022   ALKPHOS 78  02/11/2022   AST 22 02/11/2022   ALT 27 02/11/2022   PROT 6.8 02/11/2022   ALBUMIN 3.1 (L) 02/11/2022   CALCIUM 9.6 03/14/2023   ANIONGAP 7 03/14/2023   Lab Results  Component Value Date   CHOL 149 04/09/2023   Lab Results  Component Value Date   HDL 25 (L) 04/09/2023   Lab Results  Component Value Date   LDLCALC 55 04/09/2023   Lab Results  Component Value Date   TRIG 343 (H) 04/09/2023   Lab Results  Component Value Date   CHOLHDL 6.0  04/09/2023   Lab Results  Component Value Date   HGBA1C 8.8 (H) 04/09/2023      Assessment & Plan:  Hyperlipidemia, unspecified hyperlipidemia type Assessment & Plan: Previous triglycerides 439, cholesterol 195 and HDL 33. Continue statin therapy. Manage diet and regular exercise, Will check lipid panel  Orders: -     Lipid panel; Future  Other polyneuropathy Assessment & Plan: Bilateral neuropathy. Continue gabapentin.   Chronic combined systolic and diastolic congestive heart failure [facility]) Assessment & Plan: Advised patient to follow low-salt diet and monitor fluid intake. Advised regular exercise as tolerated. Continue the current medication regimen. Followed by cardiologist.     Diabetes mellitus treated with oral medication [facility]) Assessment & Plan: Previous hemoglobin A1c 10.8 on 01/03/2023. Advised pt to consume variety of food including fruits, vegetables, whole grains, complex carbohydrates and proteins. Continue losartan and statin therapy for cardiovascular risk reduction. Continue Farxiga, metformin Will add Ozempic. Will check hemoglobin A1c.  Orders: -     Hemoglobin A1c; Future  Other orders -     Semaglutide(0.25 or 0.5MG /DOS); Inject 0.25 mg into the skin once a week for four week and increase to 0.5 mg for next four week.  Dispense: 3 mL; Refill: 5    Follow-up: Return in about 3 months (around 07/10/2023) for chronic management.   Kara Dies, NP

## 2023-04-15 ENCOUNTER — Other Ambulatory Visit: Payer: Self-pay

## 2023-04-15 ENCOUNTER — Other Ambulatory Visit: Payer: Self-pay | Admitting: Medical

## 2023-04-15 ENCOUNTER — Other Ambulatory Visit: Payer: Self-pay | Admitting: Cardiology

## 2023-04-15 MED ORDER — POTASSIUM CHLORIDE CRYS ER 20 MEQ PO TBCR
20.0000 meq | EXTENDED_RELEASE_TABLET | Freq: Every day | ORAL | 2 refills | Status: DC
  Filled 2023-04-15: qty 90, 90d supply, fill #0
  Filled 2023-07-10: qty 90, 90d supply, fill #1
  Filled 2023-10-07: qty 90, 90d supply, fill #2

## 2023-04-15 MED ORDER — DIGOXIN 125 MCG PO TABS
0.1250 mg | ORAL_TABLET | Freq: Every day | ORAL | 2 refills | Status: DC
  Filled 2023-04-15: qty 90, 90d supply, fill #0
  Filled 2023-07-10: qty 90, 90d supply, fill #1

## 2023-04-15 MED ORDER — ATORVASTATIN CALCIUM 80 MG PO TABS
80.0000 mg | ORAL_TABLET | Freq: Every day | ORAL | 2 refills | Status: DC
  Filled 2023-04-15: qty 90, 90d supply, fill #0
  Filled 2023-07-10: qty 90, 90d supply, fill #1
  Filled 2023-10-07: qty 90, 90d supply, fill #2

## 2023-04-15 MED FILL — Hydroxyzine HCl Tab 10 MG: ORAL | 30 days supply | Qty: 30 | Fill #1 | Status: AC

## 2023-04-15 MED FILL — Alprazolam Tab 1 MG: ORAL | 30 days supply | Qty: 45 | Fill #1 | Status: AC

## 2023-04-16 ENCOUNTER — Other Ambulatory Visit: Payer: Self-pay

## 2023-04-16 ENCOUNTER — Other Ambulatory Visit: Payer: Self-pay | Admitting: *Deleted

## 2023-04-16 MED ORDER — LOSARTAN POTASSIUM 25 MG PO TABS
25.0000 mg | ORAL_TABLET | Freq: Every day | ORAL | 5 refills | Status: DC
  Filled 2023-04-16: qty 30, 30d supply, fill #0
  Filled 2023-06-10: qty 30, 30d supply, fill #1
  Filled 2023-07-08: qty 30, 30d supply, fill #2
  Filled 2023-08-08: qty 30, 30d supply, fill #3

## 2023-04-19 ENCOUNTER — Other Ambulatory Visit: Payer: Self-pay

## 2023-04-19 MED ORDER — SEMAGLUTIDE(0.25 OR 0.5MG/DOS) 2 MG/3ML ~~LOC~~ SOPN
PEN_INJECTOR | SUBCUTANEOUS | 5 refills | Status: DC
Start: 2023-04-19 — End: 2023-10-28
  Filled 2023-04-19: qty 3, 56d supply, fill #0
  Filled 2023-04-24 – 2023-04-30 (×5): qty 3, 42d supply, fill #0
  Filled 2023-06-10: qty 3, 28d supply, fill #1
  Filled 2023-07-08: qty 3, 28d supply, fill #2
  Filled 2023-07-25 – 2023-08-08 (×2): qty 3, 28d supply, fill #3
  Filled 2023-09-06: qty 3, 28d supply, fill #4
  Filled 2023-10-07: qty 3, 28d supply, fill #5

## 2023-04-21 ENCOUNTER — Encounter: Payer: Self-pay | Admitting: Nurse Practitioner

## 2023-04-21 DIAGNOSIS — E785 Hyperlipidemia, unspecified: Secondary | ICD-10-CM | POA: Insufficient documentation

## 2023-04-21 NOTE — Assessment & Plan Note (Signed)
Previous hemoglobin A1c 10.8 on 01/03/2023. Advised pt to consume variety of food including fruits, vegetables, whole grains, complex carbohydrates and proteins. Continue losartan and statin therapy for cardiovascular risk reduction. Continue Farxiga, metformin Will add Ozempic. Will check hemoglobin A1c.

## 2023-04-21 NOTE — Assessment & Plan Note (Signed)
Bilateral neuropathy. Continue gabapentin.

## 2023-04-21 NOTE — Assessment & Plan Note (Signed)
Advised patient to follow low-salt diet and monitor fluid intake. Advised regular exercise as tolerated. Continue the current medication regimen. Followed by cardiologist.

## 2023-04-21 NOTE — Assessment & Plan Note (Signed)
Previous triglycerides 439, cholesterol 195 and HDL 33. Continue statin therapy. Manage diet and regular exercise, Will check lipid panel

## 2023-04-21 NOTE — Assessment & Plan Note (Addendum)
Previous hemoglobin A1c 10.8 on 01/03/2023. Advised pt to consume variety of food including fruits, vegetables, whole grains, complex carbohydrates and proteins. Continue losartan and statin therapy for cardiovascular risk reduction. Continue Farxiga, metformin Will add Ozempic. Will check hemoglobin A1c.

## 2023-04-23 ENCOUNTER — Other Ambulatory Visit: Payer: Self-pay

## 2023-04-23 ENCOUNTER — Other Ambulatory Visit: Payer: Self-pay | Admitting: Nurse Practitioner

## 2023-04-23 MED FILL — Pantoprazole Sodium EC Tab 40 MG (Base Equiv): ORAL | 90 days supply | Qty: 90 | Fill #0 | Status: CN

## 2023-04-24 ENCOUNTER — Other Ambulatory Visit: Payer: Self-pay

## 2023-04-25 ENCOUNTER — Other Ambulatory Visit: Payer: Self-pay

## 2023-04-26 ENCOUNTER — Other Ambulatory Visit: Payer: Self-pay

## 2023-04-26 MED FILL — Pantoprazole Sodium EC Tab 40 MG (Base Equiv): ORAL | 90 days supply | Qty: 90 | Fill #0 | Status: AC

## 2023-04-29 ENCOUNTER — Other Ambulatory Visit: Payer: Self-pay

## 2023-04-29 ENCOUNTER — Other Ambulatory Visit (HOSPITAL_COMMUNITY): Payer: Self-pay

## 2023-04-29 ENCOUNTER — Telehealth: Payer: Self-pay | Admitting: Pharmacy Technician

## 2023-04-29 NOTE — Telephone Encounter (Signed)
Pharmacy Patient Advocate Encounter   Received notification from CoverMyMeds that prior authorization for Ozempic (0.25 or 0.5 MG/DOSE) 2MG /3ML pen-injectors is required/requested.   Insurance verification completed.   The patient is insured through [place] .   Per test claim: PA required; PA submitted to [facility] via CoverMyMeds Key/confirmation #/EOC [id number] Status is pending

## 2023-04-30 ENCOUNTER — Other Ambulatory Visit (HOSPITAL_COMMUNITY): Payer: Self-pay

## 2023-04-30 ENCOUNTER — Other Ambulatory Visit: Payer: Self-pay

## 2023-04-30 NOTE — Telephone Encounter (Signed)
Pharmacy Patient Advocate Encounter  Received notification from [facility] that Prior Authorization for Ozempic 2mg /67ml has been APPROVED from 04/29/23 to 04/28/24   PA #/Case ID/Reference #: [id number]  Approval letter indexed to media tab

## 2023-04-30 NOTE — Telephone Encounter (Signed)
Called Patient to let him know that the Ozempic has been approved for 04/29/23 to 04/28/24.

## 2023-05-14 ENCOUNTER — Other Ambulatory Visit: Payer: Self-pay | Admitting: Family

## 2023-05-14 ENCOUNTER — Other Ambulatory Visit: Payer: Self-pay | Admitting: Nurse Practitioner

## 2023-05-14 ENCOUNTER — Other Ambulatory Visit: Payer: Self-pay

## 2023-05-14 ENCOUNTER — Other Ambulatory Visit: Payer: Self-pay | Admitting: Medical

## 2023-05-14 DIAGNOSIS — G4700 Insomnia, unspecified: Secondary | ICD-10-CM

## 2023-05-15 ENCOUNTER — Other Ambulatory Visit: Payer: Self-pay

## 2023-05-15 MED ORDER — ALPRAZOLAM 1 MG PO TABS
0.5000 mg | ORAL_TABLET | Freq: Three times a day (TID) | ORAL | 0 refills | Status: DC | PRN
  Filled 2023-05-15: qty 45, 30d supply, fill #0
  Filled 2023-06-10 – 2023-06-13 (×3): qty 45, 30d supply, fill #1

## 2023-05-15 MED ORDER — HYDROXYZINE HCL 10 MG PO TABS
10.0000 mg | ORAL_TABLET | Freq: Every day | ORAL | 1 refills | Status: DC
Start: 2023-05-15 — End: 2023-07-10
  Filled 2023-05-15: qty 30, 30d supply, fill #0
  Filled 2023-06-10: qty 30, 30d supply, fill #1

## 2023-05-15 MED ORDER — SPIRONOLACTONE 25 MG PO TABS
12.5000 mg | ORAL_TABLET | Freq: Every day | ORAL | 0 refills | Status: DC
  Filled 2023-05-15: qty 45, 90d supply, fill #0

## 2023-05-15 MED ORDER — GABAPENTIN 300 MG PO CAPS
300.0000 mg | ORAL_CAPSULE | Freq: Three times a day (TID) | ORAL | 3 refills | Status: DC
  Filled 2023-05-15: qty 90, 30d supply, fill #0
  Filled 2023-06-10: qty 90, 30d supply, fill #1
  Filled 2023-07-10: qty 90, 30d supply, fill #2
  Filled 2023-08-08: qty 90, 30d supply, fill #3

## 2023-05-15 MED FILL — Dapagliflozin Propanediol Tab 10 MG (Base Equivalent): ORAL | 90 days supply | Qty: 90 | Fill #0 | Status: CN

## 2023-05-16 ENCOUNTER — Other Ambulatory Visit: Payer: Self-pay

## 2023-05-16 MED FILL — Dapagliflozin Propanediol Tab 10 MG (Base Equivalent): ORAL | 30 days supply | Qty: 30 | Fill #0 | Status: CN

## 2023-06-10 ENCOUNTER — Other Ambulatory Visit: Payer: Self-pay

## 2023-06-10 MED FILL — Dapagliflozin Propanediol Tab 10 MG (Base Equivalent): ORAL | 90 days supply | Qty: 90 | Fill #0 | Status: CN

## 2023-06-11 ENCOUNTER — Other Ambulatory Visit: Payer: Self-pay

## 2023-06-12 ENCOUNTER — Other Ambulatory Visit: Payer: Self-pay

## 2023-06-14 ENCOUNTER — Other Ambulatory Visit: Payer: Self-pay

## 2023-06-20 ENCOUNTER — Encounter: Payer: Self-pay | Admitting: Nurse Practitioner

## 2023-06-20 ENCOUNTER — Ambulatory Visit (INDEPENDENT_AMBULATORY_CARE_PROVIDER_SITE_OTHER): Payer: Medicaid Other

## 2023-06-20 ENCOUNTER — Ambulatory Visit: Payer: Medicaid Other | Admitting: Nurse Practitioner

## 2023-06-20 VITALS — BP 136/82 | HR 94 | Temp 98.0°F | Ht 73.0 in | Wt 277.2 lb

## 2023-06-20 DIAGNOSIS — Z0189 Encounter for other specified special examinations: Secondary | ICD-10-CM

## 2023-06-20 DIAGNOSIS — I255 Ischemic cardiomyopathy: Secondary | ICD-10-CM | POA: Diagnosis not present

## 2023-06-20 DIAGNOSIS — Z23 Encounter for immunization: Secondary | ICD-10-CM | POA: Diagnosis not present

## 2023-06-20 DIAGNOSIS — I10 Essential (primary) hypertension: Secondary | ICD-10-CM

## 2023-06-20 DIAGNOSIS — E119 Type 2 diabetes mellitus without complications: Secondary | ICD-10-CM

## 2023-06-20 DIAGNOSIS — I5042 Chronic combined systolic (congestive) and diastolic (congestive) heart failure: Secondary | ICD-10-CM

## 2023-06-20 DIAGNOSIS — Z7984 Long term (current) use of oral hypoglycemic drugs: Secondary | ICD-10-CM | POA: Diagnosis not present

## 2023-06-20 DIAGNOSIS — Z1159 Encounter for screening for other viral diseases: Secondary | ICD-10-CM

## 2023-06-20 LAB — CUP PACEART REMOTE DEVICE CHECK
Battery Remaining Longevity: 180 mo
Battery Remaining Percentage: 100 %
Brady Statistic RV Percent Paced: 0 %
Date Time Interrogation Session: 20241031035100
HighPow Impedance: 78 Ohm
Implantable Lead Connection Status: 753985
Implantable Lead Implant Date: 20240131
Implantable Lead Location: 753860
Implantable Lead Model: 673
Implantable Lead Serial Number: 204314
Implantable Pulse Generator Implant Date: 20240131
Lead Channel Impedance Value: 591 Ohm
Lead Channel Pacing Threshold Amplitude: 0.7 V
Lead Channel Pacing Threshold Pulse Width: 0.4 ms
Lead Channel Setting Pacing Amplitude: 2 V
Lead Channel Setting Pacing Pulse Width: 0.4 ms
Lead Channel Setting Sensing Sensitivity: 0.5 mV
Pulse Gen Serial Number: 322340

## 2023-06-20 NOTE — Progress Notes (Signed)
Established Patient Office Visit  Subjective:  Patient ID: Lawrence Murray, male    DOB: 07/28/1966  Age: 57 y.o. MRN: [id number]  CC:  Chief Complaint  Patient presents with   Medical Management of Chronic Issues    HPI  Lawrence Murray presents for chronic diseases management.  With history of diabetes with polyneuropathy, CHF, bilateral lower extremity swelling, anxiety, GERD,CAD and hypertension.   Patient states that he is compliant with his medication.  He has been experiencing bloating and excessive gas formation.  He is also due for diabetic foot examination, urine microalbumin And shingles vaccine.  HPI   Past Medical History:  Diagnosis Date   CHF (congestive heart failure) (HCC)    Coronary artery disease    Diabetes mellitus without complication (HCC)    GERD (gastroesophageal reflux disease)    Hypertension    Neuropathy     Past Surgical History:  Procedure Laterality Date   COLONOSCOPY WITH PROPOFOL N/A 06/14/2022   Procedure: COLONOSCOPY WITH PROPOFOL;  Surgeon: Wyline Mood, MD;  Location: [facility] ENDOSCOPY;  Service: Gastroenterology;  Laterality: N/A;   ICD IMPLANT N/A 09/19/2022   Procedure: ICD IMPLANT;  Surgeon: Lanier Prude, MD;  Location: [facility] INVASIVE CV LAB;  Service: Cardiovascular;  Laterality: N/A;   LOWER EXTREMITY ANGIOGRAPHY Left 02/14/2022   Procedure: Lower Extremity Angiography;  Surgeon: Annice Needy, MD;  Location: [facility] INVASIVE CV LAB;  Service: Cardiovascular;  Laterality: Left;   RIGHT/LEFT HEART CATH AND CORONARY ANGIOGRAPHY N/A 02/21/2022   Procedure: RIGHT/LEFT HEART CATH AND CORONARY ANGIOGRAPHY;  Surgeon: Yvonne Kendall, MD;  Location: [facility] INVASIVE CV LAB;  Service: Cardiovascular;  Laterality: N/A;    Family History  Problem Relation Age of Onset   Congestive Heart Failure Father     Social History   Socioeconomic History   Marital status: Single    Spouse name: Not on file   Number of children: Not on file   Years of  education: Not on file   Highest education level: 12th grade  Occupational History   Not on file  Tobacco Use   Smoking status: Former    Current packs/day: 0.00    Average packs/day: 1 pack/day for 32.0 years (32.0 ttl pk-yrs)    Types: Cigarettes    Start date: 02/04/1990    Quit date: 02/04/2022    Years since quitting: 1.4   Smokeless tobacco: Never  Vaping Use   Vaping status: Not on file  Substance and Sexual Activity   Alcohol use: Not Currently    Comment: 3 beer in last 6 month   Drug use: Never   Sexual activity: Not Currently  Other Topics Concern   Not on file  Social History Narrative   Not on file   Social Determinants of Health   Financial Resource Strain: High Risk (06/19/2023)   Overall Financial Resource Strain (CARDIA)    Difficulty of Paying Living Expenses: Hard  Food Insecurity: Food Insecurity Present (06/19/2023)   Hunger Vital Sign    Worried About Running Out of Food in the Last Year: Sometimes true    Ran Out of Food in the Last Year: Sometimes true  Transportation Needs: No Transportation Needs (06/19/2023)   PRAPARE - Administrator, Civil Service (Medical): No    Lack of Transportation (Non-Medical): No  Physical Activity: Sufficiently Active (06/19/2023)   Exercise Vital Sign    Days of Exercise per Week: 3 days    Minutes of Exercise per Session: 80  min  Stress: Stress Concern Present (06/19/2023)   [employer] of Occupational Health - Occupational Stress Questionnaire    Feeling of Stress : Very much  Social Connections: Socially Isolated (06/19/2023)   Social Connection and Isolation Panel [NHANES]    Frequency of Communication with Friends and Family: Twice a week    Frequency of Social Gatherings with Friends and Family: Once a week    Attends Religious Services: Never    Database administrator or Organizations: No    Attends Engineer, structural: Not on file    Marital Status: Never married  Careers information officer Violence: Not on file     Outpatient Medications Prior to Visit  Medication Sig Dispense Refill   Accu-Chek Softclix Lancets lancets Use as directed to check blood glucose daily 100 each 0   acetaminophen (TYLENOL) 500 MG tablet Take 2 tablets (1,000 mg total) by mouth every 6 (six) hours as needed for mild pain, moderate pain, fever or headache. 30 tablet 0   ALPRAZolam (XANAX) 1 MG tablet Take 1/2  tablet (0.5 mg total) by mouth 3 (three) times daily as needed for anxiety. 90 tablet 0   aspirin EC 81 MG tablet Take 1 tablet (81 mg total) by mouth daily. Swallow whole. 90 tablet 2   atorvastatin (LIPITOR) 80 MG tablet Take 1 tablet (80 mg total) by mouth daily. 90 tablet 2   Blood Glucose Monitoring Suppl (ACCU-CHEK GUIDE ME) w/Device KIT Use as directed daily 1 kit 0   dapagliflozin propanediol (FARXIGA) 10 MG TABS tablet Take 1 tablet by mouth daily 90 tablet 3   digoxin (LANOXIN) 0.125 MG tablet Take 1 tablet (0.125 mg total) by mouth daily. 90 tablet 2   docusate sodium (STOOL SOFTENER) 250 MG capsule Take 250 mg by mouth daily.     furosemide (LASIX) 40 MG tablet Take 1.5 tablets (60 mg total) by mouth daily. 135 tablet 3   gabapentin (NEURONTIN) 300 MG capsule Take 1 capsule (300 mg total) by mouth 3 (three) times daily. 90 capsule 3   glucose blood (ACCU-CHEK GUIDE) test strip Use as directed once daily 100 each 0   hydrOXYzine (ATARAX) 10 MG tablet Take 1 tablet (10 mg total) by mouth at bedtime. 30 tablet 1   losartan (COZAAR) 25 MG tablet Take 1 tablet (25 mg total) by mouth at bedtime. 30 tablet 5   metFORMIN (GLUCOPHAGE) 1000 MG tablet Take 1 tablet (1,000 mg total) by mouth 2 (two) times daily with a meal. 180 tablet 2   metoprolol succinate (TOPROL-XL) 50 MG 24 hr tablet Take 1 tablet (50 mg total) by mouth daily. Take with or immediately following a meal. 90 tablet 1   pantoprazole (PROTONIX) 40 MG tablet Take 1 tablet (40 mg total) by mouth daily. 90 tablet 3    potassium chloride SA (KLOR-CON M) 20 MEQ tablet Take 1 tablet (20 mEq total) by mouth daily. 90 tablet 2   Semaglutide,0.25 or 0.5MG /DOS, 2 MG/3ML SOPN Inject 0.25 mg into the skin once a week for 28 days, THEN 0.5 mg once a week 3 mL 5   spironolactone (ALDACTONE) 25 MG tablet Take 0.5 tablets (12.5 mg total) by mouth daily. 45 tablet 0   metoprolol succinate (TOPROL-XL) 25 MG 24 hr tablet Take 1.5 tablets (37.5 mg total) by mouth daily. Patient to take this dosage for 2 WEEKS. Then begin taking new rx 50 MG daily. 21 tablet 0   No facility-administered medications prior to visit.  No Known Allergies  ROS Review of Systems Negative unless indicated in HPI.    Objective:    Physical Exam Constitutional:      Appearance: Normal appearance.  HENT:     Mouth/Throat:     Mouth: Mucous membranes are moist.  Eyes:     Conjunctiva/sclera: Conjunctivae normal.     Pupils: Pupils are equal, round, and reactive to light.  Cardiovascular:     Rate and Rhythm: Normal rate and regular rhythm.     Pulses: Normal pulses.     Heart sounds: Normal heart sounds.  Pulmonary:     Effort: Pulmonary effort is normal.     Breath sounds: Normal breath sounds.  Abdominal:     General: Bowel sounds are normal.     Palpations: Abdomen is soft.  Musculoskeletal:     Cervical back: Normal range of motion. No tenderness.     Right foot: No Charcot foot or foot drop.     Left foot: No Charcot foot or foot drop.  Feet:     Right foot:     Skin integrity: Skin integrity normal.     Left foot:     Skin integrity: Skin integrity normal.  Skin:    General: Skin is warm.     Findings: No bruising.  Neurological:     General: No focal deficit present.     Mental Status: He is alert and oriented to person, place, and time. Mental status is at baseline.  Psychiatric:        Mood and Affect: Mood normal.        Behavior: Behavior normal.        Thought Content: Thought content normal.         Judgment: Judgment normal.     BP 136/82   Pulse 94   Temp 98 F (36.7 C)   Ht 6\' 1"  (1.854 m)   Wt 277 lb 3.2 oz (125.7 kg)   SpO2 96%   BMI 36.57 kg/m  Wt Readings from Last 3 Encounters:  06/20/23 277 lb 3.2 oz (125.7 kg)  04/09/23 274 lb 12.8 oz (124.6 kg)  03/14/23 284 lb (128.8 kg)     Health Maintenance  Topic Date Due   Hepatitis C Screening  Never done   COVID-19 Vaccine (1 - 2023-24 season) 07/06/2023 (Originally 04/21/2023)   INFLUENZA VACCINE  11/18/2023 (Originally 03/21/2023)   OPHTHALMOLOGY EXAM  07/25/2023   Lung Cancer Screening  08/02/2023   HEMOGLOBIN A1C  10/10/2023   Diabetic kidney evaluation - eGFR measurement  03/13/2024   Diabetic kidney evaluation - Urine ACR  06/19/2024   FOOT EXAM  06/19/2024   Colonoscopy  06/14/2029   DTaP/Tdap/Td (2 - Td or Tdap) 06/01/2032   HIV Screening  Completed   Zoster Vaccines- Shingrix  Completed   HPV VACCINES  Aged Out    There are no preventive care reminders to display for this patient.  Lab Results  Component Value Date   TSH 9.011 (H) 02/05/2022   Lab Results  Component Value Date   WBC 5.9 09/06/2022   HGB 14.4 09/06/2022   HCT 41.6 09/06/2022   MCV 99.5 09/06/2022   PLT 159 09/06/2022   Lab Results  Component Value Date   NA 137 03/14/2023   K 5.0 03/14/2023   CO2 26 03/14/2023   GLUCOSE 222 (H) 03/14/2023   BUN 23 (H) 03/14/2023   CREATININE 1.30 (H) 03/14/2023   BILITOT 1.0 02/11/2022   ALKPHOS 78  02/11/2022   AST 22 02/11/2022   ALT 27 02/11/2022   PROT 6.8 02/11/2022   ALBUMIN 3.1 (L) 02/11/2022   CALCIUM 9.6 03/14/2023   ANIONGAP 7 03/14/2023   Lab Results  Component Value Date   CHOL 149 04/09/2023   Lab Results  Component Value Date   HDL 25 (L) 04/09/2023   Lab Results  Component Value Date   LDLCALC 55 04/09/2023   Lab Results  Component Value Date   TRIG 343 (H) 04/09/2023   Lab Results  Component Value Date   CHOLHDL 6.0 04/09/2023   Lab Results  Component  Value Date   HGBA1C 8.8 (H) 04/09/2023      Assessment & Plan:  Diabetes mellitus treated with oral medication (HCC) Assessment & Plan: Lab Results  Component Value Date   HGBA1C 8.8 (H) 04/09/2023   We will hold metformin temporarily to see if GI symptoms improve.  Continue Ozempic and [place].  Orders: -     Microalbumin / creatinine urine ratio -     Hemoglobin A1c; Future  Need for hepatitis C screening test -     Hepatitis C antibody; Future  Need for shingles vaccine -     Varicella-zoster vaccine IM  Encounter for diabetic foot exam [facility]) Assessment & Plan: Light touch and protective threshold diminished during the diabetic foot examination. Advised to wear compression stockings.    Chronic combined systolic and diastolic congestive heart failure (HCC) Assessment & Plan: Followed by cardiology    Primary hypertension Assessment & Plan: Patient BP  Vitals:   06/20/23 1448  BP: 136/82    in the office. Advised pt to follow a low sodium and heart healthy diet. Continue current medication regimen      Follow-up: No follow-ups on file.   Kara Dies, NP

## 2023-06-20 NOTE — Patient Instructions (Signed)
Stop metformin due to GI side effect.  Schedule lab in December before office visit.

## 2023-06-21 ENCOUNTER — Other Ambulatory Visit: Payer: Self-pay

## 2023-06-21 LAB — MICROALBUMIN / CREATININE URINE RATIO
Creatinine,U: 84.9 mg/dL
Microalb Creat Ratio: 0.8 mg/g (ref 0.0–30.0)
Microalb, Ur: <0.7 mg/dL (ref 0.0–1.9)

## 2023-06-21 MED FILL — Dapagliflozin Propanediol Tab 10 MG (Base Equivalent): ORAL | 90 days supply | Qty: 90 | Fill #0 | Status: CN

## 2023-06-26 MED FILL — Dapagliflozin Propanediol Tab 10 MG (Base Equivalent): ORAL | 90 days supply | Qty: 90 | Fill #0 | Status: CN

## 2023-06-27 ENCOUNTER — Other Ambulatory Visit: Payer: Self-pay

## 2023-06-27 ENCOUNTER — Telehealth: Payer: Self-pay

## 2023-06-27 NOTE — Telephone Encounter (Signed)
Patient states he wants to verify that his appointment with Kara Dies, NP, on 06/20/2023, included a complete foot exam and if so, have it removed from MyChart because his insurance company looks at this.

## 2023-06-28 ENCOUNTER — Other Ambulatory Visit: Payer: Self-pay

## 2023-06-30 DIAGNOSIS — E119 Type 2 diabetes mellitus without complications: Secondary | ICD-10-CM | POA: Insufficient documentation

## 2023-06-30 NOTE — Assessment & Plan Note (Signed)
Lab Results  Component Value Date   CHOL 149 04/09/2023   HDL 25 (L) 04/09/2023   LDLCALC 55 04/09/2023   LDLDIRECT 91 10/17/2022   TRIG 343 (H) 04/09/2023   CHOLHDL 6.0 04/09/2023  Continue diet and exercise management. Continue statin therapy for cardiovascular risk reduction.

## 2023-06-30 NOTE — Assessment & Plan Note (Addendum)
Light touch and protective threshold diminished during the diabetic foot examination. Advised to wear compression stockings.

## 2023-06-30 NOTE — Assessment & Plan Note (Signed)
Patient BP  Vitals:   06/20/23 1448  BP: 136/82    in the office. Advised pt to follow a low sodium and heart healthy diet. Continue current medication regimen

## 2023-06-30 NOTE — Assessment & Plan Note (Signed)
Followed by cardiology 

## 2023-06-30 NOTE — Assessment & Plan Note (Signed)
Lab Results  Component Value Date   HGBA1C 8.8 (H) 04/09/2023   We will hold metformin temporarily to see if GI symptoms improve.  Continue Ozempic and [place].

## 2023-07-02 ENCOUNTER — Other Ambulatory Visit: Payer: Self-pay | Admitting: Acute Care

## 2023-07-02 DIAGNOSIS — Z122 Encounter for screening for malignant neoplasm of respiratory organs: Secondary | ICD-10-CM

## 2023-07-02 DIAGNOSIS — Z87891 Personal history of nicotine dependence: Secondary | ICD-10-CM

## 2023-07-03 NOTE — Progress Notes (Signed)
Remote ICD transmission.   

## 2023-07-08 ENCOUNTER — Other Ambulatory Visit: Payer: Self-pay

## 2023-07-08 MED FILL — Dapagliflozin Propanediol Tab 10 MG (Base Equivalent): ORAL | 90 days supply | Qty: 90 | Fill #0 | Status: CN

## 2023-07-08 NOTE — Telephone Encounter (Signed)
Per Oran Rein, hold for PCP review

## 2023-07-09 ENCOUNTER — Other Ambulatory Visit (HOSPITAL_COMMUNITY): Payer: Self-pay

## 2023-07-09 ENCOUNTER — Other Ambulatory Visit: Payer: Self-pay

## 2023-07-09 ENCOUNTER — Telehealth: Payer: Self-pay

## 2023-07-09 MED FILL — Dapagliflozin Propanediol Tab 10 MG (Base Equivalent): ORAL | 90 days supply | Qty: 90 | Fill #0 | Status: AC

## 2023-07-09 NOTE — Telephone Encounter (Signed)
Patient Advocate Encounter  Prior authorization for Marcelline Deist has been submitted and approved. Test billing returns $4 for 90 day supply.  Key: [id number] Effective: 06/25/2023 to 07/08/2024  Burnell Blanks, CPhT Rx Patient Advocate Phone: [phone]

## 2023-07-10 ENCOUNTER — Other Ambulatory Visit: Payer: Self-pay

## 2023-07-10 ENCOUNTER — Other Ambulatory Visit: Payer: Self-pay | Admitting: Nurse Practitioner

## 2023-07-10 ENCOUNTER — Telehealth: Payer: Self-pay | Admitting: Cardiology

## 2023-07-10 DIAGNOSIS — G4700 Insomnia, unspecified: Secondary | ICD-10-CM

## 2023-07-10 NOTE — Telephone Encounter (Signed)
lvm to confirm appt  

## 2023-07-11 ENCOUNTER — Ambulatory Visit: Payer: Medicaid Other | Admitting: Cardiology

## 2023-07-11 ENCOUNTER — Ambulatory Visit: Payer: Medicaid Other | Admitting: Nurse Practitioner

## 2023-07-11 ENCOUNTER — Other Ambulatory Visit
Admission: RE | Admit: 2023-07-11 | Discharge: 2023-07-11 | Disposition: A | Discharge status: Home / Self Care | Payer: Medicaid Other | Source: Ambulatory Visit | Attending: Cardiology | Admitting: Cardiology

## 2023-07-11 ENCOUNTER — Other Ambulatory Visit: Payer: Self-pay

## 2023-07-11 VITALS — BP 108/70 | HR 76 | Wt 278.2 lb

## 2023-07-11 DIAGNOSIS — I5042 Chronic combined systolic (congestive) and diastolic (congestive) heart failure: Secondary | ICD-10-CM | POA: Diagnosis not present

## 2023-07-11 DIAGNOSIS — E669 Obesity, unspecified: Secondary | ICD-10-CM | POA: Diagnosis not present

## 2023-07-11 DIAGNOSIS — Z7984 Long term (current) use of oral hypoglycemic drugs: Secondary | ICD-10-CM | POA: Diagnosis not present

## 2023-07-11 DIAGNOSIS — F410 Panic disorder [episodic paroxysmal anxiety] without agoraphobia: Secondary | ICD-10-CM | POA: Insufficient documentation

## 2023-07-11 DIAGNOSIS — I110 Hypertensive heart disease with heart failure: Secondary | ICD-10-CM | POA: Diagnosis not present

## 2023-07-11 DIAGNOSIS — I5022 Chronic systolic (congestive) heart failure: Secondary | ICD-10-CM | POA: Insufficient documentation

## 2023-07-11 DIAGNOSIS — I255 Ischemic cardiomyopathy: Secondary | ICD-10-CM | POA: Diagnosis not present

## 2023-07-11 DIAGNOSIS — Z6836 Body mass index (BMI) 36.0-36.9, adult: Secondary | ICD-10-CM | POA: Insufficient documentation

## 2023-07-11 DIAGNOSIS — E785 Hyperlipidemia, unspecified: Secondary | ICD-10-CM | POA: Diagnosis not present

## 2023-07-11 DIAGNOSIS — I2510 Atherosclerotic heart disease of native coronary artery without angina pectoris: Secondary | ICD-10-CM | POA: Diagnosis not present

## 2023-07-11 DIAGNOSIS — E119 Type 2 diabetes mellitus without complications: Secondary | ICD-10-CM | POA: Diagnosis not present

## 2023-07-11 DIAGNOSIS — R197 Diarrhea, unspecified: Secondary | ICD-10-CM | POA: Insufficient documentation

## 2023-07-11 DIAGNOSIS — Z5986 Financial insecurity: Secondary | ICD-10-CM | POA: Insufficient documentation

## 2023-07-11 DIAGNOSIS — Z7985 Long-term (current) use of injectable non-insulin antidiabetic drugs: Secondary | ICD-10-CM | POA: Diagnosis not present

## 2023-07-11 DIAGNOSIS — E1151 Type 2 diabetes mellitus with diabetic peripheral angiopathy without gangrene: Secondary | ICD-10-CM | POA: Insufficient documentation

## 2023-07-11 DIAGNOSIS — Z87891 Personal history of nicotine dependence: Secondary | ICD-10-CM | POA: Diagnosis not present

## 2023-07-11 DIAGNOSIS — Z95810 Presence of automatic (implantable) cardiac defibrillator: Secondary | ICD-10-CM | POA: Diagnosis not present

## 2023-07-11 DIAGNOSIS — Z7982 Long term (current) use of aspirin: Secondary | ICD-10-CM | POA: Insufficient documentation

## 2023-07-11 LAB — COMPREHENSIVE METABOLIC PANEL
ALT: 33 U/L (ref 0–44)
AST: 29 U/L (ref 15–41)
Albumin: 4.4 g/dL (ref 3.5–5.0)
Alkaline Phosphatase: 38 U/L (ref 38–126)
Anion gap: 14 (ref 5–15)
BUN: 18 mg/dL (ref 6–20)
CO2: 23 mmol/L (ref 22–32)
Calcium: 9.3 mg/dL (ref 8.9–10.3)
Chloride: 99 mmol/L (ref 98–111)
Creatinine, Ser: 1.23 mg/dL (ref 0.61–1.24)
GFR, Estimated: >60 mL/min (ref >60)
Glucose, Bld: 169 mg/dL — ABNORMAL HIGH (ref 70–99)
Potassium: 4.2 mmol/L (ref 3.5–5.1)
Sodium: 136 mmol/L (ref 135–145)
Total Bilirubin: 1.1 mg/dL (ref <1.2)
Total Protein: 7.9 g/dL (ref 6.5–8.1)

## 2023-07-11 LAB — BRAIN NATRIURETIC PEPTIDE: B Natriuretic Peptide: 70.8 pg/mL (ref 0.0–100.0)

## 2023-07-11 LAB — MAGNESIUM: Magnesium: 1.7 mg/dL (ref 1.7–2.4)

## 2023-07-11 LAB — DIGOXIN LEVEL: Digoxin Level: 1.4 ng/mL (ref 0.8–2.0)

## 2023-07-11 MED ORDER — ALPRAZOLAM 1 MG PO TABS
0.5000 mg | ORAL_TABLET | Freq: Three times a day (TID) | ORAL | 0 refills | Status: DC | PRN
  Filled 2023-07-11: qty 45, 30d supply, fill #0
  Filled 2023-08-08: qty 45, 30d supply, fill #1

## 2023-07-11 MED ORDER — DIGOXIN 125 MCG PO TABS
0.0625 mg | ORAL_TABLET | Freq: Every day | ORAL | 2 refills | Status: DC
  Filled 2023-07-11 – 2023-10-07 (×17): qty 45, 90d supply, fill #0
  Filled ????-??-??: fill #0

## 2023-07-11 MED ORDER — ENTRESTO 24-26 MG PO TABS
ORAL_TABLET | ORAL | 3 refills | Status: DC
  Filled 2023-07-11: qty 30, 30d supply, fill #0
  Filled 2023-08-08: qty 30, 30d supply, fill #1
  Filled 2023-09-06: qty 30, 30d supply, fill #2
  Filled 2023-10-07: qty 30, 30d supply, fill #3

## 2023-07-11 MED ORDER — HYDROXYZINE HCL 10 MG PO TABS
10.0000 mg | ORAL_TABLET | Freq: Every day | ORAL | 1 refills | Status: DC
Start: 2023-07-11 — End: 2023-09-08
  Filled 2023-07-11: qty 30, 30d supply, fill #0
  Filled 2023-08-08: qty 30, 30d supply, fill #1

## 2023-07-11 NOTE — Progress Notes (Signed)
ADVANCED HEART FAILURE CLINIC NOTE  Referring Physician: Kara Dies, NP  Primary Care: Kara Dies, NP Primary Cardiologist: Debbe Odea, MD  HPI: Lawrence Murray is a 57 y.o. male with HFrEF 2/2 ischemic cardiomyopathy s/p ICD (09/19/22), CAD, obesity, hx of tobacco use. Lawrence Murray' cardiac history dates back to June 2023 when he was admitted for acute heart failure exacerbation.  He had an echocardiogram at that time demonstrating an LVEF of 30 to 35%.  He had a right and left heart cath that showed severe three-vessel coronary artery disease including CTO of the first diagonal, OM1, mid LAD and ostial RPDA.  He had a cardiac MRI with LVEF of 16% and severely reduced RV systolic function.  Unfortunately coronary anatomy was not amenable to revascularization.  He had a repeat echo in November 2023 that showed an LVEF of 30% with now normal RV systolic function.  Since that time he has undergone ICD placement by Dr. Lalla Brothers.  Interval hx:  - His biggest issue since his last visit has been unbearable diarrhea. He reports that he was unable to exercise due to diarrhea and gas. He believes this was secondary to an increase in his metformin dose and/or initiation of Ozempic. He stopped metformin while on ozempic 0.5 without significant improvement in diarrhea.  - Stable from a HFrEF standpoint. Continues to have some chest pressure that is stable and unchanged. Lower extremity edema has improved significantly on lasix 60mg  daily.   Activity level/exercise tolerance:  NYHA IIB-III Orthopnea:  Sleeps on 2-3 pillows.  Paroxysmal noctural dyspnea:  No, minimal Chest pain/pressure:  no Orthostatic lightheadedness:  no Palpitations:  no Lower extremity edema:  no Presyncope/syncope:  no Cough:  no  Past Medical History:  Diagnosis Date   CHF (congestive heart failure) (HCC)    Coronary artery disease    Diabetes mellitus without complication (HCC)    GERD (gastroesophageal reflux  disease)    Hypertension    Neuropathy     Current Outpatient Medications  Medication Sig Dispense Refill   Accu-Chek Softclix Lancets lancets Use as directed to check blood glucose daily 100 each 0   acetaminophen (TYLENOL) 500 MG tablet Take 2 tablets (1,000 mg total) by mouth every 6 (six) hours as needed for mild pain, moderate pain, fever or headache. 30 tablet 0   ALPRAZolam (XANAX) 1 MG tablet Take 1/2  tablet (0.5 mg total) by mouth 3 (three) times daily as needed for anxiety. 90 tablet 0   aspirin EC 81 MG tablet Take 1 tablet (81 mg total) by mouth daily. Swallow whole. 90 tablet 2   atorvastatin (LIPITOR) 80 MG tablet Take 1 tablet (80 mg total) by mouth daily. 90 tablet 2   Blood Glucose Monitoring Suppl (ACCU-CHEK GUIDE ME) w/Device KIT Use as directed daily 1 kit 0   dapagliflozin propanediol (FARXIGA) 10 MG TABS tablet Take 1 tablet by mouth daily 90 tablet 3   digoxin (LANOXIN) 0.125 MG tablet Take 1 tablet (0.125 mg total) by mouth daily. 90 tablet 2   docusate sodium (STOOL SOFTENER) 250 MG capsule Take 250 mg by mouth daily.     furosemide (LASIX) 40 MG tablet Take 1.5 tablets (60 mg total) by mouth daily. 135 tablet 3   gabapentin (NEURONTIN) 300 MG capsule Take 1 capsule (300 mg total) by mouth 3 (three) times daily. 90 capsule 3   glucose blood (ACCU-CHEK GUIDE) test strip Use as directed once daily 100 each 0   hydrOXYzine (ATARAX) 10 MG  tablet Take 1 tablet (10 mg total) by mouth at bedtime. 30 tablet 1   losartan (COZAAR) 25 MG tablet Take 1 tablet (25 mg total) by mouth at bedtime. 30 tablet 5   metFORMIN (GLUCOPHAGE) 1000 MG tablet Take 1 tablet (1,000 mg total) by mouth 2 (two) times daily with a meal. 180 tablet 2   metoprolol succinate (TOPROL-XL) 50 MG 24 hr tablet Take 1 tablet (50 mg total) by mouth daily. Take with or immediately following a meal. 90 tablet 1   pantoprazole (PROTONIX) 40 MG tablet Take 1 tablet (40 mg total) by mouth daily. 90 tablet 3    potassium chloride SA (KLOR-CON M) 20 MEQ tablet Take 1 tablet (20 mEq total) by mouth daily. 90 tablet 2   Semaglutide,0.25 or 0.5MG /DOS, 2 MG/3ML SOPN Inject 0.25 mg into the skin once a week for 28 days, THEN 0.5 mg once a week 3 mL 5   spironolactone (ALDACTONE) 25 MG tablet Take 0.5 tablets (12.5 mg total) by mouth daily. 45 tablet 0   No current facility-administered medications for this visit.    No Known Allergies    Social History   Socioeconomic History   Marital status: Single    Spouse name: Not on file   Number of children: Not on file   Years of education: Not on file   Highest education level: 12th grade  Occupational History   Not on file  Tobacco Use   Smoking status: Former    Current packs/day: 0.00    Average packs/day: 1 pack/day for 32.0 years (32.0 ttl pk-yrs)    Types: Cigarettes    Start date: 02/04/1990    Quit date: 02/04/2022    Years since quitting: 1.4   Smokeless tobacco: Never  Vaping Use   Vaping status: Not on file  Substance and Sexual Activity   Alcohol use: Not Currently    Comment: 3 beer in last 6 month   Drug use: Never   Sexual activity: Not Currently  Other Topics Concern   Not on file  Social History Narrative   Not on file   Social Determinants of Health   Financial Resource Strain: High Risk (06/19/2023)   Overall Financial Resource Strain (CARDIA)    Difficulty of Paying Living Expenses: Hard  Food Insecurity: Food Insecurity Present (06/19/2023)   Hunger Vital Sign    Worried About Running Out of Food in the Last Year: Sometimes true    Ran Out of Food in the Last Year: Sometimes true  Transportation Needs: No Transportation Needs (06/19/2023)   PRAPARE - Administrator, Civil Service (Medical): No    Lack of Transportation (Non-Medical): No  Physical Activity: Sufficiently Active (06/19/2023)   Exercise Vital Sign    Days of Exercise per Week: 3 days    Minutes of Exercise per Session: 80 min  Stress:  Stress Concern Present (06/19/2023)   [employer] of Occupational Health - Occupational Stress Questionnaire    Feeling of Stress : Very much  Social Connections: Socially Isolated (06/19/2023)   Social Connection and Isolation Panel [NHANES]    Frequency of Communication with Friends and Family: Twice a week    Frequency of Social Gatherings with Friends and Family: Once a week    Attends Religious Services: Never    Database administrator or Organizations: No    Attends Engineer, structural: Not on file    Marital Status: Never married  Intimate Partner Violence: Not on  file      Family History  Problem Relation Age of Onset   Congestive Heart Failure Father     PHYSICAL EXAM: Vitals:   07/11/23 1533  BP: 108/70  Pulse: 76  SpO2: 98%   GENERAL: Well nourished, well developed, and in no apparent distress at rest.  HEENT: Negative for arcus senilis or xanthelasma. There is no scleral icterus.  The mucous membranes are pink and moist.   NECK: Supple, No masses. Normal carotid upstrokes without bruits. No masses or thyromegaly.    CHEST: There are no chest wall deformities. There is no chest wall tenderness. Respirations are unlabored.  Lungs- CTA B/L CARDIAC:  JVP: unable to visualize          Normal rate with regular rhythm. No murmurs, rubs or gallops.  Pulses are 2+ and symmetrical in upper and lower extremities. No edema.  ABDOMEN: Soft, non-tender, non-distended. There are no masses or hepatomegaly. There are normal bowel sounds.  EXTREMITIES: Warm and well perfused with no cyanosis, clubbing.  LYMPHATIC: No axillary or supraclavicular lymphadenopathy.  NEUROLOGIC: Patient is oriented x3 with no focal or lateralizing neurologic deficits.  PSYCH: Patients affect is appropriate, there is no evidence of anxiety or depression.  SKIN: Warm and dry; no lesions or wounds.     DATA REVIEW  ECG: 08/31/22: NSR w/ old anterior infarct.   ECHO: 07/19/22: LVEF  30%, normal RV function 02/06/22: LVEF 30-35%, RV not well visualized.  01/17/23: LVEF 25%-30%, normal RV function.   CATH: Severe three-vessel coronary artery disease, including chronic total/subtotal occlusions of large D1 and OM1 branches, mid LAD, and ostial rPDA.  There is also moderate-severe disease involving the proximal and distal LCx as well as the distal RCA. Moderately elevated left heart, right heart, and pulmonary artery pressures (LVEDP 32 mmHg, PCWP 30 mmHg, mean RA 9 mmHg, RVEDP 14 mmHg, and mean PAP 37 mmHg). Low normal Fick cardiac output/index (CO 5.7 L/min, CI 2.5 L/min/m^2).  CMR 02/23/22:  1.  Severely reduced Bi-ventricular function.  LVEF 16%, RVEF 19%.  2. There is transmural subendocardial scar in the LV anterior wall and apex (LAD territory), indicating non viability.  3.  There is 25-50% subendocardial LGE/scar in the LV lateral wall.  4. The LV lateral and inferior walls appear viable (RCA and Lcx territory).  5.  No significant valvular abnormalities.  6. Findings consistent with ischemic cardiomyopathy with viable LV lateral and inferior walls. Non-viable LV anterior and apical wall.  ABI 08/15/22:  Right: Resting right ankle-brachial index is within normal range. The  right toe-brachial index is normal.  Left: Resting left ankle-brachial index is within normal range. The left  toe-brachial index is normal.    ASSESSMENT & PLAN:  Heart Failure with reduced EF Etiology of WU:[id number] cardiomyopathy, severe multivessel CAD as noted above .  NYHA class / AHA Stage:III Volume status & Diuretics: Euvolemic to mildly hypervolemic; lasix 60mg  daily. Take extra lasix 40mg  daily today.  Vasodilators: D/C losartan, start Entresto 12/13mg  BID Beta-Blocker: increase toprol to 50mg  daily. Decrease digoxin 0.0669mcg.  WGN:FAOZHYQMVHQION 12.5mg  daaily Cardiometabolic:farxiga 10mg  daily Devices therapies & Valvulopathies:primary prevention ICD by Dr. Lalla Brothers Advanced  therapies:Lawrence Murray will very likely require advanced therapies at some point. His CMR demonstrates transmural scar of the anterior LV myocardium with severely reduced function. At this point, his weight, frequent panic attacks and motivation / insight are the biggest limiting factors. For the time being will continue to slowly uptitrate medical therapy.   2.  CAD - Severe multivessel CAD as noted above - Improvement in LDL to 55 from 160 on labs from 04/09/23 - Lipitor 80mg , ASA 81mg  daily   3. T2DM - Metformin - Continue farxiga - A1C 10.8 (01/03/23), up from 7.9 in 08/30/22.  - Now on ozempic & metformin but with severe diarrhea.   4. Obesity  - Body mass index is 36.71 kg/m. - He has started taking Ozempic now  5. Hyperlipidemia  - lipitor 80mg  daily  - LDL well controlled now  6. PAD - CT angiogram from June 2023 with RLE anterior tibial artery occlusion. Left lower extremity only has one vessel runoff. He had a follow up angiogram that  -  Most recent ABIs in December 2023 normal.  - Normal ABIs on 04/04/23  7. Diarrhea - Currently holding ozempic; if diarrhea improves will restart at 0.25 dose and continue at that dose for a much slower uptitration.   I spent 40 minutes caring for this patient today including face to face time, ordering and reviewing labs, reviewing records from [facility] (04/09/23, 06/20/23), discussing ozempic titration with pharmD today, seeing the patient, documenting in the record, and arranging follow ups.   Anayia Eugene Advanced Heart Failure Mechanical Circulatory Support

## 2023-07-11 NOTE — Patient Instructions (Signed)
DECREASE Digoxin to 0.0625mg  daily  TAKE extra lasix 40mg  today  START Entresto 12/13mg  twice daily  *once approved with insurance  STOP Losartan if entresto is approved  Routine lab work today. Will notify you of abnormal results   Follow up in 4 months with Dr.Sabharwal  Do the following things EVERYDAY: Weigh yourself in the morning before breakfast. Write it down and keep it in a log. Take your medicines as prescribed Eat low salt foods--Limit salt (sodium) to 2000 mg per day.  Stay as active as you can everyday Limit all fluids for the day to less than 2 liters

## 2023-07-12 ENCOUNTER — Telehealth (HOSPITAL_COMMUNITY): Payer: Self-pay | Admitting: Cardiology

## 2023-07-12 ENCOUNTER — Other Ambulatory Visit: Payer: Self-pay

## 2023-07-12 NOTE — Telephone Encounter (Signed)
-----   Message from [facility] sent at 07/12/2023  8:31 AM EST ----- His digoxin level is elevated. Can we please ask him to hold digoxin for the next 3 days and then start at . daily.

## 2023-07-12 NOTE — Telephone Encounter (Signed)
Patient called.  Patient aware.

## 2023-07-14 ENCOUNTER — Other Ambulatory Visit: Payer: Self-pay

## 2023-07-15 ENCOUNTER — Other Ambulatory Visit: Payer: Self-pay

## 2023-07-16 ENCOUNTER — Other Ambulatory Visit (INDEPENDENT_AMBULATORY_CARE_PROVIDER_SITE_OTHER): Payer: Medicaid Other

## 2023-07-16 DIAGNOSIS — E119 Type 2 diabetes mellitus without complications: Secondary | ICD-10-CM | POA: Diagnosis not present

## 2023-07-16 DIAGNOSIS — Z1159 Encounter for screening for other viral diseases: Secondary | ICD-10-CM | POA: Diagnosis not present

## 2023-07-16 DIAGNOSIS — Z7984 Long term (current) use of oral hypoglycemic drugs: Secondary | ICD-10-CM

## 2023-07-17 ENCOUNTER — Other Ambulatory Visit: Payer: Self-pay

## 2023-07-17 LAB — HEPATITIS C ANTIBODY: Hepatitis C Ab: NONREACTIVE

## 2023-07-17 LAB — HEMOGLOBIN A1C: Hgb A1c MFr Bld: 8.7 % — ABNORMAL HIGH (ref 4.6–6.5)

## 2023-07-21 ENCOUNTER — Other Ambulatory Visit: Payer: Self-pay

## 2023-07-24 ENCOUNTER — Other Ambulatory Visit: Payer: Self-pay

## 2023-07-24 MED FILL — Pantoprazole Sodium EC Tab 40 MG (Base Equiv): ORAL | 90 days supply | Qty: 90 | Fill #1 | Status: CN

## 2023-07-25 ENCOUNTER — Other Ambulatory Visit: Payer: Self-pay

## 2023-07-25 ENCOUNTER — Ambulatory Visit: Payer: Medicaid Other | Admitting: Nurse Practitioner

## 2023-07-25 ENCOUNTER — Encounter: Payer: Self-pay | Admitting: Nurse Practitioner

## 2023-07-25 VITALS — BP 134/82 | HR 82 | Temp 98.0°F | Ht 73.0 in | Wt 281.2 lb

## 2023-07-25 DIAGNOSIS — R197 Diarrhea, unspecified: Secondary | ICD-10-CM

## 2023-07-25 DIAGNOSIS — I5043 Acute on chronic combined systolic (congestive) and diastolic (congestive) heart failure: Secondary | ICD-10-CM | POA: Diagnosis not present

## 2023-07-25 DIAGNOSIS — E119 Type 2 diabetes mellitus without complications: Secondary | ICD-10-CM | POA: Diagnosis not present

## 2023-07-25 DIAGNOSIS — R109 Unspecified abdominal pain: Secondary | ICD-10-CM | POA: Diagnosis not present

## 2023-07-25 DIAGNOSIS — I10 Essential (primary) hypertension: Secondary | ICD-10-CM

## 2023-07-25 DIAGNOSIS — Z7984 Long term (current) use of oral hypoglycemic drugs: Secondary | ICD-10-CM | POA: Diagnosis not present

## 2023-07-25 MED ORDER — METFORMIN HCL ER 500 MG PO TB24
1000.0000 mg | ORAL_TABLET | Freq: Every day | ORAL | 1 refills | Status: DC
  Filled 2023-07-25 – 2023-08-08 (×2): qty 180, 90d supply, fill #0

## 2023-07-25 NOTE — Progress Notes (Signed)
Established Patient Office Visit  Subjective:  Patient ID: Lawrence Murray, male    DOB: Apr 20, 1966  Age: 57 y.o. MRN: [id number]  CC:  Chief Complaint  Patient presents with   Medical Management of Chronic Issues    HPI  Lawrence Murray presents for routine follow up.   He has recent follow up at advanced heart failure clinic Dr. Gasper Lloyd, cardiologist Dr. Myriam Forehand and electrophysiologist Dr. Lalla Brothers.  He stated that the diarrhea has improved some and he is not taking metformin and ozempic at present. He has noticed some blood on the toilet paper 1-2 weeks ago. Angelique Blonder h/o of hemorrhoids. No blood since then.   HPI   Past Medical History:  Diagnosis Date   CHF (congestive heart failure) (HCC)    Coronary artery disease    Diabetes mellitus without complication (HCC)    GERD (gastroesophageal reflux disease)    Hypertension    Neuropathy     Past Surgical History:  Procedure Laterality Date   COLONOSCOPY WITH PROPOFOL N/A 06/14/2022   Procedure: COLONOSCOPY WITH PROPOFOL;  Surgeon: Wyline Mood, MD;  Location: [facility] ENDOSCOPY;  Service: Gastroenterology;  Laterality: N/A;   ICD IMPLANT N/A 09/19/2022   Procedure: ICD IMPLANT;  Surgeon: Lanier Prude, MD;  Location: [facility] INVASIVE CV LAB;  Service: Cardiovascular;  Laterality: N/A;   LOWER EXTREMITY ANGIOGRAPHY Left 02/14/2022   Procedure: Lower Extremity Angiography;  Surgeon: Annice Needy, MD;  Location: [facility] INVASIVE CV LAB;  Service: Cardiovascular;  Laterality: Left;   RIGHT/LEFT HEART CATH AND CORONARY ANGIOGRAPHY N/A 02/21/2022   Procedure: RIGHT/LEFT HEART CATH AND CORONARY ANGIOGRAPHY;  Surgeon: Yvonne Kendall, MD;  Location: [facility] INVASIVE CV LAB;  Service: Cardiovascular;  Laterality: N/A;    Family History  Problem Relation Age of Onset   Congestive Heart Failure Father     Social History   Socioeconomic History   Marital status: Single    Spouse name: Not on file   Number of children: Not on file   Years of  education: Not on file   Highest education level: 12th grade  Occupational History   Not on file  Tobacco Use   Smoking status: Former    Current packs/day: 0.00    Average packs/day: 1 pack/day for 32.0 years (32.0 ttl pk-yrs)    Types: Cigarettes    Start date: 02/04/1990    Quit date: 02/04/2022    Years since quitting: 1.5   Smokeless tobacco: Never  Vaping Use   Vaping status: Not on file  Substance and Sexual Activity   Alcohol use: Not Currently    Comment: 3 beer in last 6 month   Drug use: Never   Sexual activity: Not Currently  Other Topics Concern   Not on file  Social History Narrative   Not on file   Social Drivers of Health   Financial Resource Strain: High Risk (06/19/2023)   Overall Financial Resource Strain (CARDIA)    Difficulty of Paying Living Expenses: Hard  Food Insecurity: Food Insecurity Present (06/19/2023)   Hunger Vital Sign    Worried About Running Out of Food in the Last Year: Sometimes true    Ran Out of Food in the Last Year: Sometimes true  Transportation Needs: No Transportation Needs (06/19/2023)   PRAPARE - Administrator, Civil Service (Medical): No    Lack of Transportation (Non-Medical): No  Physical Activity: Sufficiently Active (06/19/2023)   Exercise Vital Sign    Days of Exercise per Week: 3  days    Minutes of Exercise per Session: 80 min  Stress: Stress Concern Present (06/19/2023)   [employer] of Occupational Health - Occupational Stress Questionnaire    Feeling of Stress : Very much  Social Connections: Socially Isolated (06/19/2023)   Social Connection and Isolation Panel [NHANES]    Frequency of Communication with Friends and Family: Twice a week    Frequency of Social Gatherings with Friends and Family: Once a week    Attends Religious Services: Never    Database administrator or Organizations: No    Attends Engineer, structural: Not on file    Marital Status: Never married  Catering manager  Violence: Not on file     Outpatient Medications Prior to Visit  Medication Sig Dispense Refill   Accu-Chek Softclix Lancets lancets Use as directed to check blood glucose daily 100 each 0   acetaminophen (TYLENOL) 500 MG tablet Take 2 tablets (1,000 mg total) by mouth every 6 (six) hours as needed for mild pain, moderate pain, fever or headache. 30 tablet 0   ALPRAZolam (XANAX) 1 MG tablet Take 1/2  tablet (0.5 mg total) by mouth 3 (three) times daily as needed for anxiety. 90 tablet 0   aspirin EC 81 MG tablet Take 1 tablet (81 mg total) by mouth daily. Swallow whole. 90 tablet 2   atorvastatin (LIPITOR) 80 MG tablet Take 1 tablet (80 mg total) by mouth daily. 90 tablet 2   Blood Glucose Monitoring Suppl (ACCU-CHEK GUIDE ME) w/Device KIT Use as directed daily 1 kit 0   dapagliflozin propanediol (FARXIGA) 10 MG TABS tablet Take 1 tablet by mouth daily 90 tablet 3   digoxin (LANOXIN) 0.125 MG tablet Take 0.5 tablets (0.0625 mg total) by mouth daily. 45 tablet 2   docusate sodium (STOOL SOFTENER) 250 MG capsule Take 250 mg by mouth daily.     furosemide (LASIX) 40 MG tablet Take 1.5 tablets (60 mg total) by mouth daily. 135 tablet 3   gabapentin (NEURONTIN) 300 MG capsule Take 1 capsule (300 mg total) by mouth 3 (three) times daily. 90 capsule 3   glucose blood (ACCU-CHEK GUIDE) test strip Use as directed once daily 100 each 0   hydrOXYzine (ATARAX) 10 MG tablet Take 1 tablet (10 mg total) by mouth at bedtime. 30 tablet 1   losartan (COZAAR) 25 MG tablet Take 1 tablet (25 mg total) by mouth at bedtime. 30 tablet 5   metoprolol succinate (TOPROL-XL) 50 MG 24 hr tablet Take 1 tablet (50 mg total) by mouth daily. Take with or immediately following a meal. 90 tablet 1   pantoprazole (PROTONIX) 40 MG tablet Take 1 tablet (40 mg total) by mouth daily. 90 tablet 3   potassium chloride SA (KLOR-CON M) 20 MEQ tablet Take 1 tablet (20 mEq total) by mouth daily. 90 tablet 2   sacubitril-valsartan  (ENTRESTO) 24-26 MG Take 1/2 tablet by mouth twice daily. 30 tablet 3   Semaglutide,0.25 or 0.5MG /DOS, 2 MG/3ML SOPN Inject 0.25 mg into the skin once a week for 28 days, THEN 0.5 mg once a week 3 mL 5   spironolactone (ALDACTONE) 25 MG tablet Take 0.5 tablets (12.5 mg total) by mouth daily. 45 tablet 0   metFORMIN (GLUCOPHAGE) 1000 MG tablet Take 1 tablet (1,000 mg total) by mouth 2 (two) times daily with a meal. 180 tablet 2   No facility-administered medications prior to visit.    No Known Allergies  ROS Review of Systems Negative  unless indicated in HPI.    Objective:    Physical Exam Constitutional:      Appearance: Normal appearance.  HENT:     Mouth/Throat:     Mouth: Mucous membranes are moist.  Eyes:     Conjunctiva/sclera: Conjunctivae normal.     Pupils: Pupils are equal, round, and reactive to light.  Cardiovascular:     Rate and Rhythm: Normal rate and regular rhythm.     Pulses: Normal pulses.     Heart sounds: Normal heart sounds.  Pulmonary:     Effort: Pulmonary effort is normal.     Breath sounds: Normal breath sounds.  Abdominal:     General: Bowel sounds are normal.     Palpations: Abdomen is soft.  Musculoskeletal:     Cervical back: Normal range of motion. No tenderness.     Right lower leg: Edema present.     Left lower leg: Edema present.  Skin:    General: Skin is warm.     Findings: No bruising.  Neurological:     General: No focal deficit present.     Mental Status: He is alert and oriented to person, place, and time. Mental status is at baseline.  Psychiatric:        Mood and Affect: Mood normal.        Behavior: Behavior normal.        Thought Content: Thought content normal.        Judgment: Judgment normal.     BP 134/82   Pulse 82   Temp 98 F (36.7 C)   Ht 6\' 1"  (1.854 m)   Wt 281 lb 3.2 oz (127.6 kg)   SpO2 96%   BMI 37.10 kg/m  Wt Readings from Last 3 Encounters:  07/25/23 281 lb 3.2 oz (127.6 kg)  07/11/23 278 lb 4  oz (126.2 kg)  06/20/23 277 lb 3.2 oz (125.7 kg)     Health Maintenance  Topic Date Due   Lung Cancer Screening  08/02/2023   OPHTHALMOLOGY EXAM  07/25/2023   COVID-19 Vaccine (1 - 2024-25 season) 08/10/2023 (Originally 04/21/2023)   INFLUENZA VACCINE  11/18/2023 (Originally 03/21/2023)   HEMOGLOBIN A1C  01/13/2024   Diabetic kidney evaluation - Urine ACR  06/19/2024   FOOT EXAM  06/19/2024   Diabetic kidney evaluation - eGFR measurement  07/10/2024   Colonoscopy  06/14/2029   DTaP/Tdap/Td (2 - Td or Tdap) 06/01/2032   Hepatitis C Screening  Completed   HIV Screening  Completed   Zoster Vaccines- Shingrix  Completed   HPV VACCINES  Aged Out    There are no preventive care reminders to display for this patient.  Lab Results  Component Value Date   TSH 9.011 (H) 02/05/2022   Lab Results  Component Value Date   WBC 5.9 09/06/2022   HGB 14.4 09/06/2022   HCT 41.6 09/06/2022   MCV 99.5 09/06/2022   PLT 159 09/06/2022   Lab Results  Component Value Date   NA 136 07/11/2023   K 4.2 07/11/2023   CO2 23 07/11/2023   GLUCOSE 169 (H) 07/11/2023   BUN 18 07/11/2023   CREATININE 1.23 07/11/2023   BILITOT 1.1 07/11/2023   ALKPHOS 38 07/11/2023   AST 29 07/11/2023   ALT 33 07/11/2023   PROT 7.9 07/11/2023   ALBUMIN 4.4 07/11/2023   CALCIUM 9.3 07/11/2023   ANIONGAP 14 07/11/2023   Lab Results  Component Value Date   CHOL 149 04/09/2023   Lab Results  Component Value Date   HDL 25 (L) 04/09/2023   Lab Results  Component Value Date   LDLCALC 55 04/09/2023   Lab Results  Component Value Date   TRIG 343 (H) 04/09/2023   Lab Results  Component Value Date   CHOLHDL 6.0 04/09/2023   Lab Results  Component Value Date   HGBA1C 8.7 (H) 07/16/2023      Assessment & Plan:  Primary hypertension Assessment & Plan: Patient BP  Vitals:   07/25/23 1504  BP: 134/82    in the office. Advised pt to follow a low sodium and heart healthy diet. Continue current  medication regimen    Diabetes mellitus treated with oral medication [facility]) Assessment & Plan: Lab Results  Component Value Date   HGBA1C 8.7 (H) 07/16/2023  Continue diet management. Start back on metformin XR and low dose of ozempic.     Diarrhea, unspecified type Assessment & Plan: Discussed diet management and avoid sugary or carbonated beverages.    Referral placed to GI for further evaluation.   Orders: -     Ambulatory referral to Gastroenterology  Stomach cramps -     Ambulatory referral to Gastroenterology  Obesity, morbid [facility]) Assessment & Plan: Body mass index is 37.1 kg/m. Advised patient to avoid trans fat, fatty and fried food. Follow a regular physical activity schedule. Went over the risk of chronic diseases with increased weight.   Start back on ozempic once a week daily     Acute on chronic combined systolic and diastolic CHF (congestive heart failure) (HCC) Assessment & Plan: Followed by cardiology.    Other orders -     metFORMIN HCl ER; Take 2 tablets (1,000 mg total) by mouth daily with breakfast.  Dispense: 180 tablet; Refill: 1    Follow-up: Return in about 4 months (around 11/23/2023) for chronic management.   Kara Dies, NP

## 2023-07-25 NOTE — Assessment & Plan Note (Addendum)
Lab Results  Component Value Date   HGBA1C 8.7 (H) 07/16/2023  Continue diet management. Start back on metformin XR and low dose of ozempic.

## 2023-07-26 ENCOUNTER — Other Ambulatory Visit: Payer: Self-pay

## 2023-07-29 ENCOUNTER — Other Ambulatory Visit: Payer: Self-pay

## 2023-08-05 ENCOUNTER — Ambulatory Visit
Admission: RE | Admit: 2023-08-05 | Discharge: 2023-08-05 | Disposition: A | Discharge status: Home / Self Care | Payer: Medicaid Other | Source: Ambulatory Visit | Attending: Nurse Practitioner | Admitting: Nurse Practitioner

## 2023-08-05 DIAGNOSIS — Z87891 Personal history of nicotine dependence: Secondary | ICD-10-CM

## 2023-08-05 DIAGNOSIS — Z122 Encounter for screening for malignant neoplasm of respiratory organs: Secondary | ICD-10-CM | POA: Diagnosis not present

## 2023-08-05 DIAGNOSIS — F17210 Nicotine dependence, cigarettes, uncomplicated: Secondary | ICD-10-CM | POA: Diagnosis not present

## 2023-08-08 ENCOUNTER — Other Ambulatory Visit: Payer: Self-pay

## 2023-08-08 DIAGNOSIS — Z95811 Presence of heart assist device: Secondary | ICD-10-CM | POA: Insufficient documentation

## 2023-08-08 DIAGNOSIS — R197 Diarrhea, unspecified: Secondary | ICD-10-CM | POA: Insufficient documentation

## 2023-08-08 DIAGNOSIS — F322 Major depressive disorder, single episode, severe without psychotic features: Secondary | ICD-10-CM | POA: Insufficient documentation

## 2023-08-08 DIAGNOSIS — E0849 Diabetes mellitus due to underlying condition with other diabetic neurological complication: Secondary | ICD-10-CM | POA: Insufficient documentation

## 2023-08-08 DIAGNOSIS — R109 Unspecified abdominal pain: Secondary | ICD-10-CM | POA: Insufficient documentation

## 2023-08-08 MED ORDER — SPIRONOLACTONE 25 MG PO TABS
12.5000 mg | ORAL_TABLET | Freq: Every day | ORAL | 0 refills | Status: DC
  Filled 2023-08-08: qty 45, 90d supply, fill #0

## 2023-08-08 MED FILL — Pantoprazole Sodium EC Tab 40 MG (Base Equiv): ORAL | 90 days supply | Qty: 90 | Fill #1 | Status: AC

## 2023-08-08 NOTE — Assessment & Plan Note (Signed)
Patient BP  Vitals:   07/25/23 1504  BP: 134/82    in the office. Advised pt to follow a low sodium and heart healthy diet. Continue current medication regimen

## 2023-08-08 NOTE — Assessment & Plan Note (Signed)
Body mass index is 37.1 kg/m. Advised patient to avoid trans fat, fatty and fried food. Follow a regular physical activity schedule. Went over the risk of chronic diseases with increased weight.   Start back on ozempic once a week daily

## 2023-08-08 NOTE — Assessment & Plan Note (Signed)
Discussed diet management and avoid sugary or carbonated beverages.    Referral placed to GI for further evaluation.

## 2023-08-08 NOTE — Assessment & Plan Note (Signed)
Followed by cardiology 

## 2023-08-09 ENCOUNTER — Other Ambulatory Visit: Payer: Self-pay

## 2023-08-12 DIAGNOSIS — E119 Type 2 diabetes mellitus without complications: Secondary | ICD-10-CM | POA: Diagnosis not present

## 2023-08-12 LAB — HM DIABETES EYE EXAM

## 2023-08-19 ENCOUNTER — Other Ambulatory Visit: Payer: Self-pay

## 2023-08-19 DIAGNOSIS — F17210 Nicotine dependence, cigarettes, uncomplicated: Secondary | ICD-10-CM

## 2023-08-19 DIAGNOSIS — Z87891 Personal history of nicotine dependence: Secondary | ICD-10-CM

## 2023-08-19 DIAGNOSIS — Z122 Encounter for screening for malignant neoplasm of respiratory organs: Secondary | ICD-10-CM

## 2023-08-20 ENCOUNTER — Encounter: Payer: Self-pay | Admitting: Cardiology

## 2023-08-20 ENCOUNTER — Ambulatory Visit: Payer: Medicaid Other | Attending: Cardiology | Admitting: Cardiology

## 2023-08-20 VITALS — BP 108/72 | HR 96 | Ht 73.0 in | Wt 281.8 lb

## 2023-08-20 DIAGNOSIS — I2510 Atherosclerotic heart disease of native coronary artery without angina pectoris: Secondary | ICD-10-CM

## 2023-08-20 DIAGNOSIS — I255 Ischemic cardiomyopathy: Secondary | ICD-10-CM

## 2023-08-20 DIAGNOSIS — E782 Mixed hyperlipidemia: Secondary | ICD-10-CM | POA: Diagnosis not present

## 2023-08-20 DIAGNOSIS — Z95810 Presence of automatic (implantable) cardiac defibrillator: Secondary | ICD-10-CM | POA: Diagnosis not present

## 2023-08-20 NOTE — Patient Instructions (Signed)
 Medication Instructions:  Your Physician recommend you continue on your current medication as directed.    *If you need a refill on your cardiac medications before your next appointment, please call your pharmacy*   Lab Work: None ordered at this time  If you have labs (blood work) drawn today and your tests are completely normal, you will receive your results only by: MyChart Message (if you have MyChart) OR A paper copy in the mail If you have any lab test that is abnormal or we need to change your treatment, we will call you to review the results.   Testing/Procedures: None ordered at this time    Follow-Up: At [facility], you and your health needs are our priority.  As part of our continuing mission to provide you with exceptional heart care, we have created designated Provider Care Teams.  These Care Teams include your primary Cardiologist (physician) and Advanced Practice Providers (APPs -  Physician Assistants and Nurse Practitioners) who all work together to provide you with the care you need, when you need it.  We recommend signing up for the patient portal called "MyChart".  Sign up information is provided on this After Visit Summary.  MyChart is used to connect with patients for Virtual Visits (Telemedicine).  Patients are able to view lab/test results, encounter notes, upcoming appointments, etc.  Non-urgent messages can be sent to your provider as well.   To learn more about what you can do with MyChart, go to [email or web address].    Your next appointment:   6 month(s)  Provider:   You may see Debbe Odea, MD or one of the following Advanced Practice Providers on your designated Care Team:   Nicolasa Ducking, NP Eula Listen, PA-C Cadence Fransico Michael, PA-C Charlsie Quest, NP Carlos Levering, NP

## 2023-08-20 NOTE — Progress Notes (Signed)
 Cardiology Office Note:    Date:  08/20/2023   ID:  Lawrence Murray, DOB 03-Aug-1966, MRN [id number]  PCP:  Vincente Saber, NP   [facility] Providers Cardiologist:  Redell Cave, MD Electrophysiologist:  OLE ONEIDA HOLTS, MD     Referring MD: Vincente Saber, NP   Chief Complaint  Patient presents with   Follow-up    Since last visit has intermittent left side chest pain with left arm numbness that does improve with movement.  Does have multiple noncardiac related concerns that is being followed by PCP.      History of Present Illness:    Lawrence Murray is a 57 y.o. male with a hx of multivessel CAD (LHC 7/23- CTO D1, OM1, mid LAD, and ostial rPDA)  , HFrEF 30%, s/p ICD implant 1/24 obesity, former smoker x20+ years who presents for follow-up.  Complains of diarrhea several weeks/months now, also feeling gassy.  Over-the-counter simethicone  has helped a little bit but still present.  Plans to follow-up with GI in the near future.  Endorses adequate diuresing with Lasix .  Evaluated at CHF clinic, Entresto  was started.  Also started on Ozempic  by PCP.  Has a lot of stressors at home, with taking care of father who requires help with all ADLs.  Has left-sided chest discomfort when she sleeps in certain positions.  Prior notes/studies Echo 5/24 EF 25 to 30% Echo 11/23 EF 30% CMR 02/2022 EF 16%, nonviable anterior and apical wall, viable lateral and inferior walls.  Past Medical History:  Diagnosis Date   CHF (congestive heart failure) (HCC)    Coronary artery disease    Diabetes mellitus without complication (HCC)    GERD (gastroesophageal reflux disease)    Hypertension    Neuropathy     Past Surgical History:  Procedure Laterality Date   COLONOSCOPY WITH PROPOFOL  N/A 06/14/2022   Procedure: COLONOSCOPY WITH PROPOFOL ;  Surgeon: Therisa Bi, MD;  Location[facility] ENDOSCOPY;  Service: Gastroenterology;  Laterality: N/A;   ICD IMPLANT N/A 09/19/2022   Procedure:  ICD IMPLANT;  Surgeon: Holts Ole ONEIDA, MD;  Location[facility] INVASIVE CV LAB;  Service: Cardiovascular;  Laterality: N/A;   LOWER EXTREMITY ANGIOGRAPHY Left 02/14/2022   Procedure: Lower Extremity Angiography;  Surgeon: Marea Selinda RAMAN, MD;  Location: [facility] INVASIVE CV LAB;  Service: Cardiovascular;  Laterality: Left;   RIGHT/LEFT HEART CATH AND CORONARY ANGIOGRAPHY N/A 02/21/2022   Procedure: RIGHT/LEFT HEART CATH AND CORONARY ANGIOGRAPHY;  Surgeon: Mady Bruckner, MD;  Location: [facility] INVASIVE CV LAB;  Service: Cardiovascular;  Laterality: N/A;    Current Medications: Current Meds  Medication Sig   Accu-Chek Softclix Lancets lancets Use as directed to check blood glucose daily   acetaminophen  (TYLENOL ) 500 MG tablet Take 2 tablets (1,000 mg total) by mouth every 6 (six) hours as needed for mild pain, moderate pain, fever or headache.   ALPRAZolam  (XANAX ) 1 MG tablet Take 1/2  tablet (0.5 mg total) by mouth 3 (three) times daily as needed for anxiety.   aspirin  EC 81 MG tablet Take 1 tablet (81 mg total) by mouth daily. Swallow whole.   atorvastatin  (LIPITOR ) 80 MG tablet Take 1 tablet (80 mg total) by mouth daily.   Blood Glucose Monitoring Suppl (ACCU-CHEK GUIDE ME) w/Device KIT Use as directed daily   dapagliflozin  propanediol (FARXIGA ) 10 MG TABS tablet Take 1 tablet by mouth daily   digoxin  (LANOXIN ) 0.125 MG tablet Take 0.5 tablets (0.0625 mg total) by mouth daily.   docusate sodium  (STOOL SOFTENER) 250  MG capsule Take 250 mg by mouth daily.   furosemide  (LASIX ) 40 MG tablet Take 1.5 tablets (60 mg total) by mouth daily.   gabapentin  (NEURONTIN ) 300 MG capsule Take 1 capsule (300 mg total) by mouth 3 (three) times daily.   glucose blood (ACCU-CHEK GUIDE) test strip Use as directed once daily   hydrOXYzine  (ATARAX ) 10 MG tablet Take 1 tablet (10 mg total) by mouth at bedtime.   metFORMIN  (GLUCOPHAGE -XR) 500 MG 24 hr tablet Take 2 tablets (1,000 mg total) by mouth daily with breakfast.    metoprolol  succinate (TOPROL -XL) 50 MG 24 hr tablet Take 1 tablet (50 mg total) by mouth daily. Take with or immediately following a meal.   pantoprazole  (PROTONIX ) 40 MG tablet Take 1 tablet (40 mg total) by mouth daily.   potassium chloride  SA (KLOR-CON  M) 20 MEQ tablet Take 1 tablet (20 mEq total) by mouth daily.   sacubitril -valsartan  (ENTRESTO ) 24-26 MG Take 1/2 tablet by mouth twice daily.   Semaglutide ,0.25 or 0.5MG /DOS, 2 MG/3ML SOPN Inject 0.25 mg into the skin once a week for 28 days, THEN 0.5 mg once a week   spironolactone  (ALDACTONE ) 25 MG tablet Take 0.5 tablets (12.5 mg total) by mouth daily.   [DISCONTINUED] losartan  (COZAAR ) 25 MG tablet Take 1 tablet (25 mg total) by mouth at bedtime.     Allergies:   Patient has no known allergies.   Social History   Socioeconomic History   Marital status: Single    Spouse name: Not on file   Number of children: Not on file   Years of education: Not on file   Highest education level: 12th grade  Occupational History   Not on file  Tobacco Use   Smoking status: Former    Current packs/day: 0.00    Average packs/day: 1 pack/day for 32.0 years (32.0 ttl pk-yrs)    Types: Cigarettes    Start date: 02/04/1990    Quit date: 02/04/2022    Years since quitting: 1.5   Smokeless tobacco: Never  Vaping Use   Vaping status: Not on file  Substance and Sexual Activity   Alcohol use: Not Currently    Comment: 3 beer in last 6 month   Drug use: Never   Sexual activity: Not Currently  Other Topics Concern   Not on file  Social History Narrative   Not on file   Social Drivers of Health   Financial Resource Strain: High Risk (06/19/2023)   Overall Financial Resource Strain (CARDIA)    Difficulty of Paying Living Expenses: Hard  Food Insecurity: Food Insecurity Present (06/19/2023)   Hunger Vital Sign    Worried About Running Out of Food in the Last Year: Sometimes true    Ran Out of Food in the Last Year: Sometimes true   Transportation Needs: No Transportation Needs (06/19/2023)   PRAPARE - Administrator, Civil Service (Medical): No    Lack of Transportation (Non-Medical): No  Physical Activity: Sufficiently Active (06/19/2023)   Exercise Vital Sign    Days of Exercise per Week: 3 days    Minutes of Exercise per Session: 80 min  Stress: Stress Concern Present (06/19/2023)   [employer] of Occupational Health - Occupational Stress Questionnaire    Feeling of Stress : Very much  Social Connections: Socially Isolated (06/19/2023)   Social Connection and Isolation Panel [NHANES]    Frequency of Communication with Friends and Family: Twice a week    Frequency of Social Gatherings with Friends  and Family: Once a week    Attends Religious Services: Never    Active Member of Clubs or Organizations: No    Attends Engineer, Structural: Not on file    Marital Status: Never married     Family History: The patient's family history includes Congestive Heart Failure in his father.  ROS:   Please see the history of present illness.     All other systems reviewed and are negative.  EKGs/Labs/Other Studies Reviewed:    The following studies were reviewed today:   EKG Interpretation Date/Time:  Tuesday August 20 2023 16:35:59 EST Ventricular Rate:  96 PR Interval:  180 QRS Duration:  94 QT Interval:  348 QTC Calculation: 439 R Axis:   -39  Text Interpretation: Normal sinus rhythm Left axis deviation Low voltage QRS Inferior infarct (cited on or before 28-Feb-2022) Anterolateral infarct (cited on or before 28-Feb-2022) Confirmed by Darliss Rogue ([id number]) on 08/20/2023 4:41:37 PM    Recent Labs: 09/06/2022: Hemoglobin 14.4; Platelets 159 07/11/2023: ALT 33; B Natriuretic Peptide 70.8; BUN 18; Creatinine, Ser 1.23; Magnesium  1.7; Potassium 4.2; Sodium 136  Recent Lipid Panel    Component Value Date/Time   CHOL 149 04/09/2023 1645   TRIG 343 (H) 04/09/2023 1645   HDL 25  (L) 04/09/2023 1645   CHOLHDL 6.0 04/09/2023 1645   VLDL 69 (H) 04/09/2023 1645   LDLCALC 55 04/09/2023 1645   LDLDIRECT 91 10/17/2022 1428     Risk Assessment/Calculations:             Physical Exam:    VS:  BP 108/72 (BP Location: Left Arm, Patient Position: Sitting, Cuff Size: Large)   Pulse 96   Ht 6' 1 (1.854 m)   Wt 281 lb 12.8 oz (127.8 kg)   SpO2 97%   BMI 37.18 kg/m     Wt Readings from Last 3 Encounters:  08/20/23 281 lb 12.8 oz (127.8 kg)  07/25/23 281 lb 3.2 oz (127.6 kg)  07/11/23 278 lb 4 oz (126.2 kg)     GEN:  Well nourished, well developed in no acute distress HEENT: Normal NECK: No JVD; No carotid bruits CARDIAC: RRR, no murmurs, rubs, gallops RESPIRATORY:  Clear to auscultation without rales, wheezing or rhonchi  ABDOMEN: Soft, non-tender, distended MUSCULOSKELETAL:  No edema; No deformity  SKIN: Warm and dry NEUROLOGIC:  Alert and oriented x 3 PSYCHIATRIC:  Normal affect   ASSESSMENT:    1. Coronary artery disease involving native coronary artery of native heart, unspecified whether angina present   2. Ischemic cardiomyopathy   3. Mixed hyperlipidemia   4. ICD (implantable cardioverter-defibrillator) in place    PLAN:    In order of problems listed above:  Ischemic cardiomyopathy EF 30% s/p ICD.  No edema, clear lungs.  Describes NYHA class II-III symptoms.  Continue Entresto  12/13 mg twice daily, Toprol -XL 50 mg daily, Aldactone  12.5 mg daily.  Farxiga , digoxin , Lasix  60 mg daily.  Low BP preventing up titration of GDMT.  Appreciate input from heart failure service. Multivessel CAD CAD (CTO D1, OM1, mid LAD, and ostial rPDA).  Not amenable to revascularization.  Chest discomfort associated with movements indicating musculoskeletal etiology.  Continue aspirin , Lipitor  80, Toprol -XL 50 mg daily. Hyperlipidemia.  Continue Lipitor  80 mg daily. S/p ICD, follow-up with device clinic for frequent checks.  Follow-up in 6-12 months       Medication Adjustments/Labs and Tests Ordered: Current medicines are reviewed at length with the patient today.  Concerns regarding medicines are  outlined above.  Orders Placed This Encounter  Procedures   EKG 12-Lead   No orders of the defined types were placed in this encounter.   Patient Instructions  Medication Instructions:  Your Physician recommend you continue on your current medication as directed.    *If you need a refill on your cardiac medications before your next appointment, please call your pharmacy*   Lab Work: None ordered at this time  If you have labs (blood work) drawn today and your tests are completely normal, you will receive your results only by: MyChart Message (if you have MyChart) OR A paper copy in the mail If you have any lab test that is abnormal or we need to change your treatment, we will call you to review the results.   Testing/Procedures: None ordered at this time    Follow-Up: At [facility], you and your health needs are our priority.  As part of our continuing mission to provide you with exceptional heart care, we have created designated Provider Care Teams.  These Care Teams include your primary Cardiologist (physician) and Advanced Practice Providers (APPs -  Physician Assistants and Nurse Practitioners) who all work together to provide you with the care you need, when you need it.  We recommend signing up for the patient portal called MyChart.  Sign up information is provided on this After Visit Summary.  MyChart is used to connect with patients for Virtual Visits (Telemedicine).  Patients are able to view lab/test results, encounter notes, upcoming appointments, etc.  Non-urgent messages can be sent to your provider as well.   To learn more about what you can do with MyChart, go to [email or web address].    Your next appointment:   6 month(s)  Provider:   You may see Redell Cave, MD or one of the following Advanced  Practice Providers on your designated Care Team:   Lonni Meager, NP Bernardino Bring, PA-C Cadence Franchester, PA-C Tylene Lunch, NP Barnie Hila, NP   Signed, Redell Cave, MD  08/20/2023 5:36 PM    [facility]

## 2023-09-06 ENCOUNTER — Other Ambulatory Visit: Payer: Self-pay | Admitting: Family

## 2023-09-06 ENCOUNTER — Other Ambulatory Visit: Payer: Self-pay

## 2023-09-06 ENCOUNTER — Other Ambulatory Visit: Payer: Self-pay | Admitting: Nurse Practitioner

## 2023-09-06 ENCOUNTER — Other Ambulatory Visit: Payer: Self-pay | Admitting: Medical

## 2023-09-06 DIAGNOSIS — G4700 Insomnia, unspecified: Secondary | ICD-10-CM

## 2023-09-06 MED ORDER — FUROSEMIDE 40 MG PO TABS
60.0000 mg | ORAL_TABLET | Freq: Every day | ORAL | 3 refills | Status: DC
  Filled 2023-09-06: qty 135, 90d supply, fill #0

## 2023-09-08 ENCOUNTER — Other Ambulatory Visit: Payer: Self-pay

## 2023-09-08 MED FILL — Hydroxyzine HCl Tab 10 MG: ORAL | 30 days supply | Qty: 30 | Fill #0 | Status: AC

## 2023-09-08 MED FILL — Alprazolam Tab 1 MG: ORAL | 30 days supply | Qty: 45 | Fill #0 | Status: AC

## 2023-09-09 ENCOUNTER — Other Ambulatory Visit: Payer: Self-pay

## 2023-09-19 ENCOUNTER — Ambulatory Visit: Payer: Medicaid Other

## 2023-09-19 DIAGNOSIS — I255 Ischemic cardiomyopathy: Secondary | ICD-10-CM | POA: Diagnosis not present

## 2023-09-19 LAB — CUP PACEART REMOTE DEVICE CHECK
Battery Remaining Longevity: 180 mo
Battery Remaining Percentage: 100 %
Brady Statistic RV Percent Paced: 0 %
Date Time Interrogation Session: 20250130040400
HighPow Impedance: 79 Ohm
Implantable Lead Connection Status: 753985
Implantable Lead Implant Date: 20240131
Implantable Lead Location: 753860
Implantable Lead Model: 673
Implantable Lead Serial Number: 204314
Implantable Pulse Generator Implant Date: 20240131
Lead Channel Impedance Value: 613 Ohm
Lead Channel Pacing Threshold Amplitude: 0.7 V
Lead Channel Pacing Threshold Pulse Width: 0.4 ms
Lead Channel Setting Pacing Amplitude: 2 V
Lead Channel Setting Pacing Pulse Width: 0.4 ms
Lead Channel Setting Sensing Sensitivity: 0.5 mV
Pulse Gen Serial Number: 322340

## 2023-09-22 ENCOUNTER — Encounter: Payer: Self-pay | Admitting: Cardiology

## 2023-09-23 ENCOUNTER — Telehealth: Payer: Self-pay

## 2023-09-23 NOTE — Telephone Encounter (Signed)
Copied from CRM [id number]. Topic: Referral - Question >> Sep 23, 2023  3:27 PM Samuel Jester B wrote: Reason for CRM: Pt stated that he received a referral for a Gastroenterologist however he received a call from the office and was informed by the office that they were not located in town, and wanted to know can they send the referral to an Solicitor office in [place].

## 2023-09-26 NOTE — Telephone Encounter (Signed)
 Can you please send the referral to [facility] instead of [facility].

## 2023-10-07 ENCOUNTER — Other Ambulatory Visit: Payer: Self-pay

## 2023-10-07 ENCOUNTER — Other Ambulatory Visit: Payer: Self-pay | Admitting: Nurse Practitioner

## 2023-10-07 ENCOUNTER — Other Ambulatory Visit: Payer: Self-pay | Admitting: Cardiology

## 2023-10-07 MED ORDER — GABAPENTIN 300 MG PO CAPS
300.0000 mg | ORAL_CAPSULE | Freq: Three times a day (TID) | ORAL | 1 refills | Status: DC
  Filled 2023-10-07: qty 90, 30d supply, fill #0

## 2023-10-07 MED ORDER — METOPROLOL SUCCINATE ER 50 MG PO TB24
50.0000 mg | ORAL_TABLET | Freq: Every day | ORAL | 1 refills | Status: DC
  Filled 2023-10-07: qty 90, 90d supply, fill #0

## 2023-10-07 MED FILL — Dapagliflozin Propanediol Tab 10 MG (Base Equivalent): ORAL | 90 days supply | Qty: 90 | Fill #1 | Status: AC

## 2023-10-07 MED FILL — Alprazolam Tab 1 MG: ORAL | 30 days supply | Qty: 45 | Fill #1 | Status: AC

## 2023-10-07 MED FILL — Hydroxyzine HCl Tab 10 MG: ORAL | 30 days supply | Qty: 30 | Fill #1 | Status: AC

## 2023-10-25 NOTE — Progress Notes (Signed)
 Remote ICD transmission.

## 2023-10-27 ENCOUNTER — Emergency Department
Admission: EM | Admit: 2023-10-27 | Discharge: 2023-11-19 | Disposition: E | Attending: Pulmonary Disease | Admitting: Pulmonary Disease

## 2023-10-27 ENCOUNTER — Emergency Department

## 2023-10-27 ENCOUNTER — Encounter: Admission: EM | Disposition: E | Payer: Self-pay | Source: Home / Self Care | Attending: Emergency Medicine

## 2023-10-27 DIAGNOSIS — I2510 Atherosclerotic heart disease of native coronary artery without angina pectoris: Secondary | ICD-10-CM | POA: Insufficient documentation

## 2023-10-27 DIAGNOSIS — J439 Emphysema, unspecified: Secondary | ICD-10-CM | POA: Diagnosis not present

## 2023-10-27 DIAGNOSIS — J810 Acute pulmonary edema: Secondary | ICD-10-CM | POA: Diagnosis not present

## 2023-10-27 DIAGNOSIS — R0902 Hypoxemia: Secondary | ICD-10-CM | POA: Diagnosis not present

## 2023-10-27 DIAGNOSIS — Z950 Presence of cardiac pacemaker: Secondary | ICD-10-CM | POA: Insufficient documentation

## 2023-10-27 DIAGNOSIS — E872 Acidosis, unspecified: Secondary | ICD-10-CM

## 2023-10-27 DIAGNOSIS — K4020 Bilateral inguinal hernia, without obstruction or gangrene, not specified as recurrent: Secondary | ICD-10-CM | POA: Diagnosis not present

## 2023-10-27 DIAGNOSIS — E8721 Acute metabolic acidosis: Secondary | ICD-10-CM | POA: Diagnosis not present

## 2023-10-27 DIAGNOSIS — E1110 Type 2 diabetes mellitus with ketoacidosis without coma: Secondary | ICD-10-CM | POA: Diagnosis not present

## 2023-10-27 DIAGNOSIS — I447 Left bundle-branch block, unspecified: Secondary | ICD-10-CM | POA: Diagnosis not present

## 2023-10-27 DIAGNOSIS — I469 Cardiac arrest, cause unspecified: Secondary | ICD-10-CM

## 2023-10-27 DIAGNOSIS — R570 Cardiogenic shock: Secondary | ICD-10-CM

## 2023-10-27 DIAGNOSIS — I110 Hypertensive heart disease with heart failure: Secondary | ICD-10-CM | POA: Diagnosis not present

## 2023-10-27 DIAGNOSIS — Z7984 Long term (current) use of oral hypoglycemic drugs: Secondary | ICD-10-CM | POA: Diagnosis not present

## 2023-10-27 DIAGNOSIS — I214 Non-ST elevation (NSTEMI) myocardial infarction: Secondary | ICD-10-CM | POA: Diagnosis not present

## 2023-10-27 DIAGNOSIS — A419 Sepsis, unspecified organism: Principal | ICD-10-CM

## 2023-10-27 DIAGNOSIS — R0989 Other specified symptoms and signs involving the circulatory and respiratory systems: Secondary | ICD-10-CM | POA: Diagnosis not present

## 2023-10-27 DIAGNOSIS — K429 Umbilical hernia without obstruction or gangrene: Secondary | ICD-10-CM | POA: Diagnosis not present

## 2023-10-27 DIAGNOSIS — Z7982 Long term (current) use of aspirin: Secondary | ICD-10-CM | POA: Diagnosis not present

## 2023-10-27 DIAGNOSIS — I509 Heart failure, unspecified: Secondary | ICD-10-CM | POA: Insufficient documentation

## 2023-10-27 DIAGNOSIS — R6521 Severe sepsis with septic shock: Secondary | ICD-10-CM | POA: Diagnosis not present

## 2023-10-27 DIAGNOSIS — Z79899 Other long term (current) drug therapy: Secondary | ICD-10-CM | POA: Diagnosis not present

## 2023-10-27 DIAGNOSIS — R0600 Dyspnea, unspecified: Secondary | ICD-10-CM | POA: Diagnosis not present

## 2023-10-27 DIAGNOSIS — N179 Acute kidney failure, unspecified: Secondary | ICD-10-CM | POA: Diagnosis not present

## 2023-10-27 DIAGNOSIS — I213 ST elevation (STEMI) myocardial infarction of unspecified site: Secondary | ICD-10-CM | POA: Diagnosis not present

## 2023-10-27 DIAGNOSIS — I10 Essential (primary) hypertension: Secondary | ICD-10-CM | POA: Diagnosis not present

## 2023-10-27 DIAGNOSIS — R079 Chest pain, unspecified: Secondary | ICD-10-CM | POA: Diagnosis not present

## 2023-10-27 DIAGNOSIS — J811 Chronic pulmonary edema: Secondary | ICD-10-CM | POA: Diagnosis not present

## 2023-10-27 SURGERY — CORONARY/GRAFT ACUTE MI REVASCULARIZATION
Anesthesia: Moderate Sedation

## 2023-10-27 NOTE — ED Triage Notes (Signed)
 Pt arrived from home BIB [facility] with c/o CP since Friday, pain radiates down bilateral arms and shoulders. 9/10 pain, took 4 or 5 baby ASA which help ease the pain.  Pt also st pain is worse with taking a deep breath. Pt also been having flu-like symptoms and SOB with a productive cough. Has not ate or drink much d/t feeling "sick". Takes lasix daily, but pt reports has not had a lot of output today. BP in triage 62/37, bilateral arms SBP <65  EMS vitals 115 HR 99% RA 103/72 97.9 T 20-23 [facility] EKG showed LBBB

## 2023-10-28 ENCOUNTER — Other Ambulatory Visit: Payer: Self-pay

## 2023-10-28 ENCOUNTER — Emergency Department

## 2023-10-28 DIAGNOSIS — J811 Chronic pulmonary edema: Secondary | ICD-10-CM | POA: Diagnosis not present

## 2023-10-28 DIAGNOSIS — K429 Umbilical hernia without obstruction or gangrene: Secondary | ICD-10-CM | POA: Diagnosis not present

## 2023-10-28 DIAGNOSIS — J439 Emphysema, unspecified: Secondary | ICD-10-CM | POA: Diagnosis not present

## 2023-10-28 DIAGNOSIS — A419 Sepsis, unspecified organism: Secondary | ICD-10-CM | POA: Diagnosis present

## 2023-10-28 DIAGNOSIS — R0989 Other specified symptoms and signs involving the circulatory and respiratory systems: Secondary | ICD-10-CM | POA: Diagnosis not present

## 2023-10-28 DIAGNOSIS — R0600 Dyspnea, unspecified: Secondary | ICD-10-CM | POA: Diagnosis not present

## 2023-10-28 DIAGNOSIS — K4020 Bilateral inguinal hernia, without obstruction or gangrene, not specified as recurrent: Secondary | ICD-10-CM | POA: Diagnosis not present

## 2023-10-28 DIAGNOSIS — R079 Chest pain, unspecified: Secondary | ICD-10-CM | POA: Diagnosis not present

## 2023-10-28 LAB — CBC WITH DIFFERENTIAL/PLATELET
Abs Immature Granulocytes: 0.1 10*3/uL — ABNORMAL HIGH (ref 0.00–0.07)
Basophils Absolute: 0.1 10*3/uL (ref 0.0–0.1)
Basophils Relative: 0 %
Eosinophils Absolute: 0 10*3/uL (ref 0.0–0.5)
Eosinophils Relative: 0 %
HCT: 45 % (ref 39.0–52.0)
Hemoglobin: 15.1 g/dL (ref 13.0–17.0)
Immature Granulocytes: 1 %
Lymphocytes Relative: 9 %
Lymphs Abs: 1.5 10*3/uL (ref 0.7–4.0)
MCH: 35.4 pg — ABNORMAL HIGH (ref 26.0–34.0)
MCHC: 33.6 g/dL (ref 30.0–36.0)
MCV: 105.4 fL — ABNORMAL HIGH (ref 80.0–100.0)
Monocytes Absolute: 1 10*3/uL (ref 0.1–1.0)
Monocytes Relative: 6 %
Neutro Abs: 14.7 10*3/uL — ABNORMAL HIGH (ref 1.7–7.7)
Neutrophils Relative %: 84 %
Platelets: 187 10*3/uL (ref 150–400)
RBC: 4.27 MIL/uL (ref 4.22–5.81)
RDW: 13.2 % (ref 11.5–15.5)
WBC: 17.3 10*3/uL — ABNORMAL HIGH (ref 4.0–10.5)
nRBC: 0 % (ref 0.0–0.2)

## 2023-10-28 LAB — PROTIME-INR
INR: 1.1 (ref 0.8–1.2)
Prothrombin Time: 14.9 s (ref 11.4–15.2)

## 2023-10-28 LAB — CBG MONITORING, ED: Glucose-Capillary: 260 mg/dL — ABNORMAL HIGH (ref 70–99)

## 2023-10-28 LAB — COMPREHENSIVE METABOLIC PANEL
ALT: 42 U/L (ref 0–44)
AST: 101 U/L — ABNORMAL HIGH (ref 15–41)
Albumin: 4 g/dL (ref 3.5–5.0)
Alkaline Phosphatase: 44 U/L (ref 38–126)
Anion gap: 23 — ABNORMAL HIGH (ref 5–15)
BUN: 21 mg/dL — ABNORMAL HIGH (ref 6–20)
CO2: 16 mmol/L — ABNORMAL LOW (ref 22–32)
Calcium: 8.7 mg/dL — ABNORMAL LOW (ref 8.9–10.3)
Chloride: 93 mmol/L — ABNORMAL LOW (ref 98–111)
Creatinine, Ser: 2.27 mg/dL — ABNORMAL HIGH (ref 0.61–1.24)
GFR, Estimated: 33 mL/min — ABNORMAL LOW (ref >60)
Glucose, Bld: 310 mg/dL — ABNORMAL HIGH (ref 70–99)
Potassium: 4.7 mmol/L (ref 3.5–5.1)
Sodium: 132 mmol/L — ABNORMAL LOW (ref 135–145)
Total Bilirubin: 2.8 mg/dL — ABNORMAL HIGH (ref 0.0–1.2)
Total Protein: 7.3 g/dL (ref 6.5–8.1)

## 2023-10-28 LAB — PROCALCITONIN: Procalcitonin: 0.3 ng/mL

## 2023-10-28 LAB — RESP PANEL BY RT-PCR (RSV, FLU A&B, COVID)  RVPGX2
Influenza A by PCR: NEGATIVE
Influenza B by PCR: NEGATIVE
Resp Syncytial Virus by PCR: NEGATIVE
SARS Coronavirus 2 by RT PCR: NEGATIVE

## 2023-10-28 LAB — APTT: aPTT: 32 s (ref 24–36)

## 2023-10-28 LAB — BRAIN NATRIURETIC PEPTIDE: B Natriuretic Peptide: 1429.7 pg/mL — ABNORMAL HIGH (ref 0.0–100.0)

## 2023-10-28 LAB — LACTIC ACID, PLASMA: Lactic Acid, Venous: 5.3 mmol/L (ref 0.5–1.9)

## 2023-10-28 LAB — TROPONIN I (HIGH SENSITIVITY): Troponin I (High Sensitivity): 8359 ng/L (ref <18)

## 2023-10-28 LAB — D-DIMER, QUANTITATIVE: D-Dimer, Quant: 0.3 ug{FEU}/mL (ref 0.00–0.50)

## 2023-10-28 LAB — BETA-HYDROXYBUTYRIC ACID: Beta-Hydroxybutyric Acid: 6.37 mmol/L — ABNORMAL HIGH (ref 0.05–0.27)

## 2023-10-28 LAB — MAGNESIUM: Magnesium: 1.9 mg/dL (ref 1.7–2.4)

## 2023-10-28 MED ORDER — CALCIUM CHLORIDE 10 % IV SOLN
INTRAVENOUS | Status: AC | PRN
  Administered 2023-10-28 (×2): 1 g via INTRAVENOUS

## 2023-10-28 MED ORDER — SUCCINYLCHOLINE CHLORIDE 200 MG/10ML IV SOSY
PREFILLED_SYRINGE | INTRAVENOUS | Status: AC
  Filled 2023-10-28: qty 10

## 2023-10-28 MED ORDER — DOCUSATE SODIUM 100 MG PO CAPS: 100.0000 mg | ORAL_CAPSULE | Freq: Two times a day (BID) | ORAL | Status: DC | PRN

## 2023-10-28 MED ORDER — METRONIDAZOLE 500 MG/100ML IV SOLN
500.0000 mg | Freq: Once | INTRAVENOUS | Status: AC
  Administered 2023-10-28: 500 mg via INTRAVENOUS
  Filled 2023-10-28: qty 100

## 2023-10-28 MED ORDER — ETOMIDATE 2 MG/ML IV SOLN
INTRAVENOUS | Status: AC | PRN
  Administered 2023-10-28: 30 mg via INTRAVENOUS

## 2023-10-28 MED ORDER — LACTATED RINGERS IV BOLUS (SEPSIS)
1000.0000 mL | Freq: Once | INTRAVENOUS | Status: AC
  Administered 2023-10-28: 1000 mL via INTRAVENOUS

## 2023-10-28 MED ORDER — EPINEPHRINE 1 MG/10ML IJ SOSY
PREFILLED_SYRINGE | INTRAMUSCULAR | Status: AC | PRN
  Administered 2023-10-28 (×2): 1 mg via INTRAVENOUS

## 2023-10-28 MED ORDER — SODIUM BICARBONATE 8.4 % IV SOLN
INTRAVENOUS | Status: AC | PRN
  Administered 2023-10-28 (×3): 100 meq via INTRAVENOUS

## 2023-10-28 MED ORDER — ETOMIDATE 2 MG/ML IV SOLN
INTRAVENOUS | Status: AC
  Filled 2023-10-28: qty 20

## 2023-10-28 MED ORDER — PROPOFOL 1000 MG/100ML IV EMUL
INTRAVENOUS | Status: AC
  Filled 2023-10-28: qty 100

## 2023-10-28 MED ORDER — ATROPINE SULFATE 1 MG/10ML IJ SOSY
PREFILLED_SYRINGE | INTRAMUSCULAR | Status: AC | PRN
Start: 2023-10-28 — End: 2023-10-28
  Administered 2023-10-28 (×2): 1 mg via INTRAVENOUS

## 2023-10-28 MED ORDER — FENTANYL CITRATE PF 50 MCG/ML IJ SOSY: 50.0000 ug | PREFILLED_SYRINGE | Freq: Once | INTRAMUSCULAR | Status: DC

## 2023-10-28 MED ORDER — SODIUM CHLORIDE 0.9 % IV SOLN
2.0000 g | Freq: Once | INTRAVENOUS | Status: AC
  Administered 2023-10-28: 2 g via INTRAVENOUS
  Filled 2023-10-28: qty 12.5

## 2023-10-28 MED ORDER — FENTANYL CITRATE PF 50 MCG/ML IJ SOSY
50.0000 ug | PREFILLED_SYRINGE | Freq: Once | INTRAMUSCULAR | Status: AC
  Administered 2023-10-28: 50 ug via INTRAVENOUS
  Filled 2023-10-28: qty 1

## 2023-10-28 MED ORDER — IOHEXOL 350 MG/ML SOLN
80.0000 mL | Freq: Once | INTRAVENOUS | Status: AC | PRN
  Administered 2023-10-28: 80 mL via INTRAVENOUS

## 2023-10-28 MED ORDER — NOREPINEPHRINE 4 MG/250ML-% IV SOLN
INTRAVENOUS | Status: AC
Start: 2023-10-28 — End: 2023-10-28
  Administered 2023-10-28: 2.667 ug/min via INTRAVENOUS
  Filled 2023-10-28: qty 250

## 2023-10-28 MED ORDER — HEPARIN BOLUS VIA INFUSION
4000.0000 [IU] | Freq: Once | INTRAVENOUS | Status: DC
  Filled 2023-10-28: qty 4000

## 2023-10-28 MED ORDER — HEPARIN (PORCINE) 25000 UT/250ML-% IV SOLN
1400.0000 [IU]/h | INTRAVENOUS | Status: DC
  Filled 2023-10-28: qty 250

## 2023-10-28 MED ORDER — INSULIN ASPART 100 UNIT/ML IJ SOLN
0.0000 [IU] | INTRAMUSCULAR | Status: DC
Start: 2023-10-28 — End: 2023-10-28

## 2023-10-28 MED ORDER — ONDANSETRON HCL 4 MG/2ML IJ SOLN
4.0000 mg | Freq: Once | INTRAMUSCULAR | Status: AC
  Administered 2023-10-28: 4 mg via INTRAVENOUS
  Filled 2023-10-28: qty 2

## 2023-10-28 MED ORDER — VANCOMYCIN HCL IN DEXTROSE 1-5 GM/200ML-% IV SOLN: 1000.0000 mg | Freq: Once | INTRAVENOUS | Status: DC

## 2023-10-28 MED ORDER — NOREPINEPHRINE 4 MG/250ML-% IV SOLN: 0.0000 ug/min | INTRAVENOUS | Status: DC

## 2023-10-28 MED ORDER — POLYETHYLENE GLYCOL 3350 17 G PO PACK: 17.0000 g | PACK | Freq: Every day | ORAL | Status: DC | PRN

## 2023-10-28 MED ORDER — ROCURONIUM BROMIDE 10 MG/ML (PF) SYRINGE
PREFILLED_SYRINGE | INTRAVENOUS | Status: AC
  Filled 2023-10-28: qty 10

## 2023-10-28 MED ORDER — LACTATED RINGERS IV BOLUS (SEPSIS)
400.0000 mL | Freq: Once | INTRAVENOUS | Status: DC
Start: 2023-10-28 — End: 2023-10-28

## 2023-11-01 ENCOUNTER — Telehealth: Payer: Self-pay

## 2023-11-01 NOTE — Telephone Encounter (Signed)
 Copied from CRM [id number]. Topic: General - Deceased Patient >> 01-Dec-2023  2:06 PM Elizebeth Brooking wrote: Name of caller: Noreene Larsson   Date of death: 11-27-23   Name of funeral home: [facility] [employer] [facility] [employer]   Phone number of funeral home: [phone]  Provider that needs to sign form: Kara Dies, NP   Timeline for signing: 5 Days

## 2023-11-02 LAB — CULTURE, BLOOD (ROUTINE X 2)
Culture: NO GROWTH
Culture: NO GROWTH

## 2023-11-05 NOTE — Telephone Encounter (Signed)
 Please inform the funeral home the death certificate has been completed.

## 2023-11-05 NOTE — Telephone Encounter (Unsigned)
 Copied from CRM [id number]. Topic: General - Deceased Patient >> 2023-11-14  2:06 PM Elizebeth Brooking wrote: Name of caller: Noreene Larsson   Date of death: 10-Nov-2023   Name of funeral home: [facility] [employer] [facility]   Phone number of funeral home: [phone]  Provider that needs to sign form: Kara Dies, NP   Timeline for signing: 5 Days >> Nov 05, 2023  9:16 AM Almira Coaster wrote: Noreene Larsson from [facility] [employer] is calling to follow up on the death certificate. It's been a little more than 5 days and they have not gotten a response yet. Best call back number is [phone].

## 2023-11-06 NOTE — Telephone Encounter (Signed)
 Called [employer] [facility] [employer] and spoke to Angelic to let her know that the death certificate is completed and the clinic's number in case the funeral home needs anything else.

## 2023-11-19 NOTE — ED Notes (Signed)
 0130: Patient reporting shortness of breath in CT. On 2L of oxygen in CT. Patient needed to sit up after exam. Assisted to sitting by CT staff. Patient pale, restless, stating he felt like he was going to pass out. Patient assisted back into laying position, still able to converse with staff. Patient moved back onto ER stretcher, patient's GCS at 12, decreased responsiveness, pale. Ben from CT pushed patient back to room, this RN notified charge RN and Dr Elesa Massed of change in status.  3086: Patient placed on non-rebreather, then switched to manual bagging with jaw thrust. Additional Ivs initiated. Fluids restarted from CT. 0145: Patient awake and conversing with staff again. Heart rate noted to be tachycardiac, blood pressures in the 120s.  0150: Patient transitioned to Bipap machine to assist with breathing.  0200: Patient became unresponsive, pulses still present, change in heart rate to 50s. Dr Elesa Massed called to bedside. 0215: Intubation performed due to change in mental status. Pressors started. Manual bagging after intubation easy. Pulses were still present, EKG changes. Dr Elesa Massed remains at bedside. 0216: Pulses lost and CPR started-please see code narrator for series of events. 0230: Time of death called. 5784: Patient not an ME case. All tubes and lines removed from patient. 0245: [facility] notified of time of death. Awaiting Dr Elesa Massed to notify family. Celina at [facility] to call back with decision of organ donation. 0300: Dr Elesa Massed notified patient's father of death. 6962: Patient declined from [facility].

## 2023-11-19 NOTE — ED Notes (Signed)
 Cpr stopped.  No pulse.  Cpr reinitiated.

## 2023-11-19 NOTE — ED Notes (Signed)
 Cpr at 02:15 Cpr provider change 0218 no pulse

## 2023-11-19 NOTE — Progress Notes (Signed)
 ANTICOAGULATION CONSULT NOTE  Pharmacy Consult for heparin infusion Indication: ACS/STEMI  No Known Allergies  Patient Measurements: Height: 6\' 1"  (185.4 cm) Weight: 128 kg (282 lb 3 oz) IBW/kg (Calculated) : 79.9 Heparin Dosing Weight: 108.3 kg  Vital Signs: Temp: 98 F (36.7 C) (03/10 0115) Temp Source: Rectal (03/10 0115) BP: 84/50 (03/10 0053) Pulse Rate: 114 (03/10 0045)  Labs: Recent Labs    10/25/2023 0028  HGB 15.1  HCT 45.0  PLT 187  APTT 32  LABPROT 14.9  INR 1.1  CREATININE 2.27*  TROPONINIHS 8,359*    Estimated Creatinine Clearance: 50.9 mL/min (A) (by C-G formula based on SCr of 2.27 mg/dL (H)).   Medical History: Past Medical History:  Diagnosis Date   CHF (congestive heart failure) (HCC)    Coronary artery disease    Diabetes mellitus without complication (HCC)    GERD (gastroesophageal reflux disease)    Hypertension    Neuropathy     Assessment: Pt is a 58 yo male presenting to ED c/o SOB & CP, found with elevated BNP and Troponin I level.  Goal of Therapy:  Heparin level 0.3-0.7 units/ml Monitor platelets by anticoagulation protocol: Yes   Plan:  Bolus 4000 units x 1 Start heparin infusion at 1400 units/hr Will check HL in 6 hr after start of infusion CBC daily while on heparin  Otelia Sergeant, PharmD, MBA 11/03/2023 1:32 AM

## 2023-11-19 NOTE — Sepsis Progress Note (Signed)
 Elink following code sepsis

## 2023-11-19 NOTE — ED Notes (Signed)
 Patient's belongings of wallet, cellphone, and charger provided to security. Patient's pants and undergarments sent with patient.

## 2023-11-19 NOTE — ED Provider Notes (Signed)
 [facility] Provider Note    Event Date/Time   First MD Initiated Contact with Patient 10/27/23 2357     (approximate)   History   Chest Pain   HPI  Lawrence Murray is a 58 y.o. male with history of coronary artery disease, ischemic cardiomyopathy status post pacemaker/defibrillator, hypertension, diabetes, obesity who presents to the emergency department stating that he has not been feeling well for the past 3 days.  Patient reports subjective fevers, cough and congestion, sore throat, vomiting and diarrhea, sharp pleuritic chest pain, chest pressure that radiates in both arms, shortness of breath, diaphoresis.  He reports he has been taking over-the-counter medications without relief.  He denies history of PE or DVT.  Reports he took 4 aspirin prior to arrival.  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   History provided by patient, EMS.    Past Medical History:  Diagnosis Date   CHF (congestive heart failure) (HCC)    Coronary artery disease    Diabetes mellitus without complication (HCC)    GERD (gastroesophageal reflux disease)    Hypertension    Neuropathy     Past Surgical History:  Procedure Laterality Date   COLONOSCOPY WITH PROPOFOL N/A 06/14/2022   Procedure: COLONOSCOPY WITH PROPOFOL;  Surgeon: Wyline Mood, MD;  Location: [facility] ENDOSCOPY;  Service: Gastroenterology;  Laterality: N/A;   ICD IMPLANT N/A 09/19/2022   Procedure: ICD IMPLANT;  Surgeon: Lanier Prude, MD;  Location: [facility] INVASIVE CV LAB;  Service: Cardiovascular;  Laterality: N/A;   LOWER EXTREMITY ANGIOGRAPHY Left 02/14/2022   Procedure: Lower Extremity Angiography;  Surgeon: Annice Needy, MD;  Location: [facility] INVASIVE CV LAB;  Service: Cardiovascular;  Laterality: Left;   RIGHT/LEFT HEART CATH AND CORONARY ANGIOGRAPHY N/A 02/21/2022   Procedure: RIGHT/LEFT HEART CATH AND CORONARY ANGIOGRAPHY;  Surgeon: Yvonne Kendall, MD;   Location: [facility] INVASIVE CV LAB;  Service: Cardiovascular;  Laterality: N/A;    MEDICATIONS:  Prior to Admission medications   Medication Sig Start Date End Date Taking? Authorizing Provider  Accu-Chek Softclix Lancets lancets Use as directed to check blood glucose daily 04/13/22     acetaminophen (TYLENOL) 500 MG tablet Take 2 tablets (1,000 mg total) by mouth every 6 (six) hours as needed for mild pain, moderate pain, fever or headache. 03/01/22   Sunnie Nielsen, DO  ALPRAZolam Prudy Feeler) 1 MG tablet Take 1/2  tablet (0.5 mg total) by mouth 3 (three) times daily as needed for anxiety. 09/08/23   Kara Dies, NP  aspirin EC 81 MG tablet Take 1 tablet (81 mg total) by mouth daily. Swallow whole. 07/24/22   Furth, Cadence H, PA-C  atorvastatin (LIPITOR) 80 MG tablet Take 1 tablet (80 mg total) by mouth daily. 04/15/23   Furth, Cadence H, PA-C  Blood Glucose Monitoring Suppl (ACCU-CHEK GUIDE ME) w/Device KIT Use as directed daily 04/13/22     dapagliflozin propanediol (FARXIGA) 10 MG TABS tablet Take 1 tablet by mouth daily 05/15/23   Delma Freeze, FNP  digoxin (LANOXIN) 0.125 MG tablet Take 0.5 tablets (0.0625 mg total) by mouth daily. 07/11/23   Sabharwal, Aditya, DO  docusate sodium (STOOL SOFTENER) 250 MG capsule Take 250 mg by mouth daily.    [provider]  furosemide (LASIX) 40 MG tablet Take 1.5 tablets (60 mg total) by mouth daily. 09/06/23   Debbe Odea, MD  gabapentin (NEURONTIN) 300 MG capsule Take 1 capsule (300 mg total) by  mouth 3 (three) times daily. 10/07/23   Kara Dies, NP  glucose blood (ACCU-CHEK GUIDE) test strip Use as directed once daily 02/12/23   Kara Dies, NP  hydrOXYzine (ATARAX) 10 MG tablet Take 1 tablet (10 mg total) by mouth at bedtime. 09/08/23   Kara Dies, NP  metFORMIN (GLUCOPHAGE-XR) 500 MG 24 hr tablet Take 2 tablets (1,000 mg total) by mouth daily with breakfast. 07/25/23   Kara Dies, NP  metoprolol succinate  (TOPROL-XL) 50 MG 24 hr tablet Take 1 tablet (50 mg total) by mouth daily. Take with or immediately following a meal. 10/07/23 01/05/24  Sabharwal, Aditya, DO  pantoprazole (PROTONIX) 40 MG tablet Take 1 tablet (40 mg total) by mouth daily. 04/23/23   Kara Dies, NP  potassium chloride SA (KLOR-CON M) 20 MEQ tablet Take 1 tablet (20 mEq total) by mouth daily. 04/15/23   Furth, Cadence H, PA-C  sacubitril-valsartan (ENTRESTO) 24-26 MG Take 1/2 tablet by mouth twice daily. 07/11/23   Sabharwal, Eliezer Lofts, DO  Semaglutide,0.25 or 0.5MG /DOS, 2 MG/3ML SOPN Inject 0.25 mg into the skin once a week for 28 days, THEN 0.5 mg once a week 04/19/23 11/04/23  Kara Dies, NP  spironolactone (ALDACTONE) 25 MG tablet Take 0.5 tablets (12.5 mg total) by mouth daily. 08/08/23   Debbe Odea, MD  QUEtiapine (SEROQUEL) 25 MG tablet Take 1 tablet (25 mg total) by mouth at bedtime. 04/26/22 05/31/22  Debbe Odea, MD    Physical Exam   Triage Vital Signs: ED Triage Vitals  Encounter Vitals Group     BP 10/27/23 2355 (!) 62/37     Systolic BP Percentile --      Diastolic BP Percentile --      Pulse Rate 10/27/23 2348 (!) 112     Resp 10/27/23 2348 20     Temp --      Temp src --      SpO2 10/27/23 2348 99 %     Weight 10/27/23 2355 282 lb 3 oz (128 kg)     Height 10/27/23 2355 6\' 1"  (1.854 m)     Head Circumference --      Peak Flow --      Pain Score 10/27/23 2351 10     Pain Loc --      Pain Education --      Exclude from Growth Chart --     Most recent vital signs: Vitals:   10/30/2023 0115 10/19/2023 0157  BP:    Pulse:  (!) 105  Resp:  (!) 25  Temp: 98 F (36.7 C)   SpO2:  100%    CONSTITUTIONAL: Alert, responds appropriately to questions.  Obese, appears uncomfortable HEAD: Normocephalic, atraumatic EYES: Conjunctivae clear, pupils appear equal, sclera nonicteric ENT: normal nose; moist mucous membranes NECK: Supple, normal ROM CARD: Regular and tachycardic; S1 and S2  appreciated RESP: Normal chest excursion without splinting or tachypnea; breath sounds clear and equal bilaterally; no wheezes, no rhonchi, no rales, no hypoxia or respiratory distress, speaking full sentences ABD/GI: Non-distended; soft, non-tender, no rebound, no guarding, no peritoneal signs BACK: The back appears normal EXT: Normal ROM in all joints; no deformity noted, no edema, no calf tenderness or calf swelling SKIN: Normal color for age and race; slightly cool to touch NEURO: Moves all extremities equally, normal speech, no facial asymmetry PSYCH: The patient's mood and manner are appropriate.   ED Results / Procedures / Treatments   LABS: (all labs ordered are listed, but only abnormal results are  displayed) Labs Reviewed  LACTIC ACID, PLASMA - Abnormal; Notable for the following components:      Result Value   Lactic Acid, Venous 5.3 (*)    All other components within normal limits  COMPREHENSIVE METABOLIC PANEL - Abnormal; Notable for the following components:   Sodium 132 (*)    Chloride 93 (*)    CO2 16 (*)    Glucose, Bld 310 (*)    BUN 21 (*)    Creatinine, Ser 2.27 (*)    Calcium 8.7 (*)    AST 101 (*)    Total Bilirubin 2.8 (*)    GFR, Estimated 33 (*)    Anion gap 23 (*)    All other components within normal limits  CBC WITH DIFFERENTIAL/PLATELET - Abnormal; Notable for the following components:   WBC 17.3 (*)    MCV 105.4 (*)    MCH 35.4 (*)    Neutro Abs 14.7 (*)    Abs Immature Granulocytes 0.10 (*)    All other components within normal limits  BRAIN NATRIURETIC PEPTIDE - Abnormal; Notable for the following components:   B Natriuretic Peptide 1,429.7 (*)    All other components within normal limits  BETA-HYDROXYBUTYRIC ACID - Abnormal; Notable for the following components:   Beta-Hydroxybutyric Acid 6.37 (*)    All other components within normal limits  CBG MONITORING, ED - Abnormal; Notable for the following components:   Glucose-Capillary 260 (*)     All other components within normal limits  TROPONIN I (HIGH SENSITIVITY) - Abnormal; Notable for the following components:   Troponin I (High Sensitivity) 8,359 (*)    All other components within normal limits  RESP PANEL BY RT-PCR (RSV, FLU A&B, COVID)  RVPGX2  CULTURE, BLOOD (ROUTINE X 2)  CULTURE, BLOOD (ROUTINE X 2)  PROTIME-INR  APTT  MAGNESIUM  D-DIMER, QUANTITATIVE  PROCALCITONIN     EKG:  EKG Interpretation Date/Time:  Sunday October 27 2023 23:49:04 EDT Ventricular Rate:  112 PR Interval:  226 QRS Duration:  110 QT Interval:  308 QTC Calculation: 420 R Axis:   86  Text Interpretation: Sinus tachycardia with 1st degree A-V block Low voltage QRS Cannot rule out Anteroseptal infarct (cited on or before 28-Feb-2022) Abnormal ECG When compared with ECG of 20-Aug-2023 16:35, PR interval has increased QRS axis Shifted right Confirmed by Rochele Raring [phone]) on 10/27/2023 11:57:02 PM         RADIOLOGY: My personal review and interpretation of imaging: CT scan shows no dissection.  CT and chest x-ray showed mild pulmonary edema.  I have personally reviewed all radiology reports.   CT Angio Chest/Abd/Pel for Dissection W and/or Wo Contrast Result Date: 11/18/2023 CLINICAL DATA:  Acute aortic syndrome (AAS) suspected. Dyspnea, chest pain, diaphoresis EXAM: CT ANGIOGRAPHY CHEST, ABDOMEN AND PELVIS TECHNIQUE: Non-contrast CT of the chest was initially obtained. Multidetector CT imaging through the chest, abdomen and pelvis was performed using the standard protocol during bolus administration of intravenous contrast. Multiplanar reconstructed images and MIPs were obtained and reviewed to evaluate the vascular anatomy. RADIATION DOSE REDUCTION: This exam was performed according to the departmental dose-optimization program which includes automated exposure control, adjustment of the mA and/or kV according to patient size and/or use of iterative reconstruction technique. CONTRAST:  80mL  OMNIPAQUE IOHEXOL 350 MG/ML SOLN COMPARISON:  None Available. FINDINGS: CTA CHEST FINDINGS Cardiovascular: Moderate multi-vessel coronary artery calcification. Global cardiac size within normal limits. Left subclavian single lead pacemaker defibrillator in place with its lead within the  right ventricle the apex. No pericardial effusion. The central pulmonary arteries are enlarged in keeping with changes of pulmonary arterial hypertension. No intraluminal filling defect identified through the segmental level to suggest acute pulmonary embolism. The thoracic aorta is normal; no intramural hematoma, dissection, or aneurysm. The arch vasculature demonstrates classic anatomic configuration and is widely patent. Mediastinum/Nodes: No enlarged mediastinal, hilar, or axillary lymph nodes. Thyroid gland, trachea, and esophagus demonstrate no significant findings. Lungs/Pleura: Mild paraseptal emphysema. Mild interstitial pulmonary edema and small bilateral pleural effusions noted. No pneumothorax. No central obstructing lesion. Musculoskeletal: No chest wall abnormality. No acute or significant osseous findings. Review of the MIP images confirms the above findings. CTA ABDOMEN AND PELVIS FINDINGS VASCULAR Aorta: Normal caliber aorta without aneurysm, dissection, vasculitis or significant stenosis. Moderate mixed atherosclerotic plaque within the infrarenal segment. Celiac: Patent without evidence of aneurysm, dissection, vasculitis or significant stenosis. SMA: Patent without evidence of aneurysm, dissection, vasculitis or significant stenosis. Renals: Both renal arteries are patent without evidence of aneurysm, dissection, vasculitis, fibromuscular dysplasia or significant stenosis. IMA: Patent without evidence of aneurysm, dissection, vasculitis or significant stenosis. Inflow: Patent without evidence of aneurysm, dissection, vasculitis or significant stenosis. Veins: No obvious venous abnormality within the limitations of  this arterial phase study. Review of the MIP images confirms the above findings. NON-VASCULAR Hepatobiliary: No focal liver abnormality is seen. No gallstones, gallbladder wall thickening, or biliary dilatation. Pancreas: Unremarkable Spleen: Unremarkable Adrenals/Urinary Tract: Adrenal glands are unremarkable. Kidneys are normal, without renal calculi, focal lesion, or hydronephrosis. Bladder is unremarkable. Stomach/Bowel: Stomach is within normal limits. Appendix appears normal. No evidence of bowel wall thickening, distention, or inflammatory changes. Lymphatic: No pathologic adenopathy within the abdomen and pelvis. Reproductive: Prostate is unremarkable. Other: Small fat containing bilateral inguinal hernias. Tiny broad-based fat containing umbilical hernia. Musculoskeletal: No acute bone abnormality. No lytic or blastic bone lesion. Review of the MIP images confirms the above findings. IMPRESSION: 1. No evidence of aortic dissection or aneurysm. 2. Moderate multi-vessel coronary artery calcification. 3. Mild interstitial pulmonary edema and small bilateral pleural effusions. 4. Enlargement of the central pulmonary arteries in keeping with changes of pulmonary arterial hypertension. Aortic Atherosclerosis (ICD10-I70.0) and Emphysema (ICD10-J43.9). Electronically Signed   By: Helyn Numbers M.D.   On: 10/23/2023 02:00   DG Chest Port 1 View Result Date: 10/26/2023 CLINICAL DATA:  Dyspnea EXAM: PORTABLE CHEST 1 VIEW COMPARISON:  09/19/2022 FINDINGS: Lungs are symmetrically well expanded. No pneumothorax or pleural effusion. Cardiac size is at the upper limits of. There is central pulmonary vascular congestion superimposed perihilar interstitial pulmonary edema in keeping with changes of cardiogenic failure. Left subclavian single lead pacemaker defibrillator is unchanged. IMPRESSION: 1. Mild cardiogenic failure. Electronically Signed   By: Helyn Numbers M.D.   On: 11/08/2023 00:22      PROCEDURES:  Critical Care performed: Yes, see critical care procedure note(s)   CRITICAL CARE Performed by: Rochele Raring   Total critical care time: 90 minutes  Critical care time was exclusive of separately billable procedures and treating other patients.  Critical care was necessary to treat or prevent imminent or life-threatening deterioration.  Critical care was time spent personally by me on the following activities: development of treatment plan with patient and/or surrogate as well as nursing, discussions with consultants, evaluation of patient's response to treatment, examination of patient, obtaining history from patient or surrogate, ordering and performing treatments and interventions, ordering and review of laboratory studies, ordering and review of radiographic studies, pulse oximetry and re-evaluation of patient's  condition.   INTUBATION Performed by: Rochele Raring  Required items: required blood products, implants, devices, and special equipment available Patient identity confirmed: provided demographic data and hospital-assigned identification number Time out: Immediately prior to procedure a "time out" was called to verify the correct patient, procedure, equipment, support staff and site/side marked as required.  Indications: Respiratory failure  Intubation method: Glidescope Laryngoscopy   Preoxygenation: BVM  Sedatives: none Paralytic: none  Tube Size: 7.5 cuffed  Post-procedure assessment: chest rise and ETCO2 monitor Breath sounds: equal and absent over the epigastrium Tube secured with: ETT holder  Patient tolerated the procedure well with no immediate complications.   Cardiopulmonary Resuscitation (CPR) Procedure Note Directed/Performed by: Rochele Raring I personally directed ancillary staff and/or performed CPR in an effort to regain return of spontaneous circulation and to maintain cardiac, neuro and systemic perfusion.   Marland Kitchen1-3 Lead EKG  Interpretation  Performed by: Gordy Goar, Layla Maw, DO Authorized by: Daveon Arpino, Layla Maw, DO     Interpretation: abnormal     ECG rate:  112   ECG rate assessment: tachycardic     Rhythm: sinus rhythm     Ectopy: none     Conduction: normal       IMPRESSION / MDM / ASSESSMENT AND PLAN / ED COURSE  I reviewed the triage vital signs and the nursing notes.    Patient here for flulike symptoms, chest pain and shortness of breath.  The patient is on the cardiac monitor to evaluate for evidence of arrhythmia and/or significant heart rate changes.   DIFFERENTIAL DIAGNOSIS (includes but not limited to):   Viral URI, pneumonia, sepsis, dehydration, ACS, PE, dissection, CHF, pneumothorax, cardiogenic shock   Patient's presentation is most consistent with acute presentation with potential threat to life or bodily function.   PLAN: I was initially contacted via EMS regarding this patient having chest discomfort and an EKG that was reading an acute STEMI.  On review of their EKG, I did not meet criteria for ST elevation so this was not activated in route.  In triage, patient was found to be hypotensive and brought immediately to a room.  I saw the patient immediately upon arrival to the room.  He appeared uncomfortable but was awake, talking complaining of infectious symptoms but also chest pain, shortness of breath with significant cardiac history.  EKG x 2 here showed very minimal ST elevation in the inferior leads but no reciprocal changes and did not meet STEMI criteria.  Patient was tachycardic.  Rectal temperature was obtained which was normal but he did have 4 baby aspirin just prior to arrival.  He reports subjective fevers at home.  Labs including cultures ordered.  Given hypotension and concerns for potential sepsis, IV fluids and antibiotics initiated.  I performed a bedside ultrasound that showed ejection fraction of around 30%.  Cardiogenic shock also on the differential.  There was no  pericardial seen.  Given his level of discomfort, CT dissection study also ordered.  Fentanyl ordered for pain control.   MEDICATIONS GIVEN IN ED: Medications  lactated ringers bolus 1,000 mL (0 mLs Intravenous Stopped 10/24/2023 0200)    And  lactated ringers bolus 1,000 mL (0 mLs Intravenous Stopped 10/29/2023 0200)    And  lactated ringers bolus 400 mL (has no administration in time range)  vancomycin (VANCOCIN) IVPB 1000 mg/200 mL premix (has no administration in time range)  heparin bolus via infusion 4,000 Units (has no administration in time range)  heparin ADULT infusion 100 units/mL (25000  units/252mL) (has no administration in time range)  rocuronium (ZEMURON) 100 MG/10ML injection (has no administration in time range)  succinylcholine (ANECTINE) 200 MG/10ML syringe (has no administration in time range)  etomidate (AMIDATE) 2 MG/ML injection (has no administration in time range)  propofol (DIPRIVAN) 1000 MG/100ML infusion (has no administration in time range)  norepinephrine (LEVOPHED) 4mg  in (0.016 mg/mL) premix infusion (0 mcg/min Intravenous Stopped 10/30/2023 0230)  docusate sodium (COLACE) capsule 100 mg (has no administration in time range)  polyethylene glycol (MIRALAX / GLYCOLAX) packet 17 g (has no administration in time range)  ceFEPIme (MAXIPIME) 2 g in sodium chloride 0.9 % 100 mL IVPB (0 g Intravenous Stopped 11/07/2023 0119)  metroNIDAZOLE (FLAGYL) IVPB 500 mg (0 mg Intravenous Stopped 11/16/2023 0210)  fentaNYL (SUBLIMAZE) injection 50 mcg (50 mcg Intravenous Given 10/22/2023 0056)  ondansetron (ZOFRAN) injection 4 mg (4 mg Intravenous Given 10/22/2023 0059)  iohexol (OMNIPAQUE) 350 MG/ML injection 80 mL (80 mLs Intravenous Contrast Given 10/25/2023 0129)  atropine 1 MG/10ML injection (1 mg Intravenous Given 11/04/2023 0213)  etomidate (AMIDATE) injection (30 mg Intravenous Given 11/05/2023 0209)  EPINEPHrine (ADRENALIN) 1 MG/10ML injection (1 mg Intravenous Given 11/02/2023 0221)  sodium  bicarbonate injection (100 mEq Intravenous Given 11/14/2023 0226)  EPINEPHrine (ADRENALIN) 1 MG/10ML injection (1 mg Intravenous Given 11/10/2023 0222)  calcium chloride injection (1 g Intravenous Given 11/13/2023 0224)     ED COURSE: Patient's workup revealed leukocytosis of 17,000 with left shift.  Patient also had a new AKI.  Lactic 5.3.  Also had a metabolic acidosis which could be from lactic acidosis from shock which was likely multifactorial -septic versus possible cardiogenic.  Patient was getting 2 and half liters of IV fluids for 30 mL/kg IV fluid bolus and broad-spectrum antibiotics.  Beta hydroxybutyric acid level added on given concern for potential DKA.  Urinalysis also pending.  Troponin came back elevated 8359.  Repeat EKG still showed no ST elevation.  The patient had already had full dose aspirin at home.  Heparin on hold until dissection study completed.  Chest x-ray reviewed and interpreted by myself and the radiologist and shows mild pulmonary edema.  He had no hypoxia and had only been placed on nasal cannula by nursing staff for comfort.  Given his hypotension and concern for possible sepsis given infectious symptoms, I felt it was imperative to continue hydration rather than diuresis at this time with close monitoring.  D dimer negative.  BNP 1400.  With fluids, patient's perfusion improved and his skin felt warm to touch and was less mottled in nature and his blood pressures were improving to the 120s to 130s systolic.  Patient brought back from CT scan and nurse reports he had a syncopal event.  They report after the CT scan he was complaining of shortness of breath but no angioedema, urticaria.  Low suspicion for allergic reaction, anaphylaxis.  He had decreased responsiveness, appeared gray and mottled.  Blood pressures however had improved to the 120s to 130s systolic.  I was able to ventilate the patient with BVM and he began to wake him up and was talking, moving all extremities.  No  witnessed seizure-like activity.  Blood glucose obtained and was in the 280s.  Patient placed on BiPAP given concerns for CHF.  Patient's only complaint at that time was that he needed to have a bowel movement.  CT scan reviewed and interpreted by myself and radiologist and showed no dissection and again redemonstrated mild pulmonary edema that was seen on  chest x-ray.    I was then called back into the room as patient was bradycardic.  Still normotensive.  He was given atropine with improvement into the 70s.  Plan to interrogate his pacemaker/defibrillator.  I called Webb Silversmith, NP with critical care who agreed to see patient for admission.  I also paged Dr. Juliann Pares with cardiology to review the case with him but unfortunately was not able to talk to him when he called back as the patient decompensated and needed intubation.  I was then called back in the patient's room because he became bradycardic and unresponsive.  Heart rate in the 40s.  Given 1 mg of atropine.  Still normotensive. Patient was intubated due to change in mental status, GCS of 3 without the need for sedation or paralytics but then patient began to gag and bite at the tube.  He was given etomidate and propofol was started for sedation.  Blood pressure now in the 80s.  Levophed started.  Repeat EKGs at this time were concerning for increased ST elevation in inferior leads.  Code STEMI was activated and 4000 unit heparin bolus was given.  One to 2 minutes after intubation, patient became bradycardic again.  He got another 1mg  of atropine.  Patient lost his pulses.  He was in PEA.  CPR initiated at 2:16 AM.  Patient was given multiple rounds of epinephrine, bicarb, calcium.  He had received 2 L of IV fluids, Flagyl and cefepime.  He had not yet received the vancomycin.  Unfortunately the patient then went into asystole after resuscitation for over 15 minutes.  No cardiac activity seen on bedside ultrasound.  GCS 3, no gag, corneal  reflex, pupils fixed.  No signs of life.  Decision made at that time to discontinue further resuscitative measures after discussion with other ED physician and ICU team.   Time of death called at 2:30 AM.  Patient will not be a medical examiner case.  I was able to get in touch with Dr. Juliann Pares after time of death was called.  He was already here at the hospital for the code STEMI.  He had reviewed patient's prior EKGs and history.  He did not feel that the patient's initial EKGs met STEMI criteria and felt that it would be very unlikely for him to have been able to intervene on anything given his prior documented history.    ICU nurse practitioner Webb Silversmith at bedside during the code as well.  Appreciate her help as well as cardiology's assistance with this patient.  I did call the patient's father Tasia Catchings listed in the chart and updated him regarding the unfortunate death of his son.  I have offered to also call patient's mother June and his son Ayesha Rumpf who is 70 years old and in high school.  Tasia Catchings states that he will contact them himself.  He reports that he is disabled and will not be able to come up to the emergency department.  Father Tasia Catchings does state that he has someone to help care for him in the morning (home health nurse, meals on wheels) and he will get in touch with his grandson.  Father states that he was concerned that his son had the flu or pneumonia given his productive cough at home, complaints of bodyaches.   CONSULTS:  ICU team and cardiology consulted.   OUTSIDE RECORDS REVIEWED:  Cath 02/21/2022:  Conclusions: Severe three-vessel coronary artery disease, including chronic total/subtotal occlusions of large D1 and OM1 branches, mid LAD,  and ostial rPDA.  There is also moderate-severe disease involving the proximal and distal LCx as well as the distal RCA. Moderately elevated left heart, right heart, and pulmonary artery pressures (LVEDP 32 mmHg, PCWP 30 mmHg, mean RA 9 mmHg,  RVEDP 14 mmHg, and mean PAP 37 mmHg). Low normal Fick cardiac output/index (CO 5.7 L/min, CI 2.5 L/min/m^2).   Recommendations: Escalate diuresis, as filling pressures indicate persistent volume overload.  I will add metolazone 2.5 mg daily to current regimen of furosemide 80 mg IV twice daily. Escalate goal-directed medical therapy for acute HFrEF due to ischemic cardiomyopathy as blood pressure and renal function allow. Once optimized from a volume standpoint, recommend cardiac MRI to assess for viability to help guide revascularization strategy.  If LAD +/- LCx territory is viable, evaluation cardiac surgery consultation will need to be considered.   Cardiac MRI:   IMPRESSION: 1.  Severely reduced Bi-ventricular function.  LVEF 16%, RVEF 19%.   2. There is transmural subendocardial scar in the LV anterior wall and apex (LAD territory), indicating non viability.   3.  There is 25-50% subendocardial LGE/scar in the LV lateral wall.   4. The LV lateral and inferior walls appear viable (RCA and LCx territory).   5.  No significant valvular abnormalities.   6. Findings consistent with ischemic cardiomyopathy with viable LV lateral and inferior walls. Non-viable LV anterior and apical wall.          FINAL CLINICAL IMPRESSION(S) / ED DIAGNOSES   Final diagnoses:  Septic shock (HCC)  AKI (acute kidney injury) (HCC)  NSTEMI (non-ST elevated myocardial infarction) (HCC)  Metabolic acidosis  Diabetic ketoacidosis without coma associated with type 2 diabetes mellitus (HCC)  Cardiac arrest (HCC)  Acute pulmonary edema (HCC)  Cardiogenic shock (HCC)     Rx / DC Orders   ED Discharge Orders     None        Note:  This document was prepared using Dragon voice recognition software and may include unintentional dictation errors.   Henley Boettner, Layla Maw, DO 11/09/2023 [phone]

## 2023-11-19 NOTE — ED Notes (Signed)
 Patient placed on 2L of oxygen due to report of shortness of breath and chest pain. Patient flushed, diaphorectic, very anxious.

## 2023-11-19 NOTE — ED Notes (Signed)
 Called [facility] to initiate CODE STEMI @0212  spoke to Rep::Jamie who gathered demographics to activate page. Dr. Juliann Pares aware of patient from page for consult by Dr.Ward @ 0206 prior to intubation & Cardiac Arrest.

## 2023-11-19 NOTE — Progress Notes (Signed)
 CODE SEPSIS - PHARMACY COMMUNICATION  **Broad Spectrum Antibiotics should be administered within 1 hour of Sepsis diagnosis**  Time Code Sepsis Called/Page Received: 0022  Antibiotics Ordered: Cefepime, Flagyl, Vancomycin  Time of 1st antibiotic administration: 0049  Otelia Sergeant, PharmD, [facility] 11/12/2023 12:23 AM

## 2023-11-19 DEATH — deceased

## 2023-11-26 ENCOUNTER — Ambulatory Visit: Payer: Medicaid Other | Admitting: Nurse Practitioner

## 2023-12-26 IMAGING — DX DG CHEST 1V PORT
1 series · 1 of 1 positions shown · non-contrast
Comparison: None Available.

CLINICAL DATA: Shortness of breath.

EXAM:
PORTABLE CHEST 1 VIEW

[chest ap]
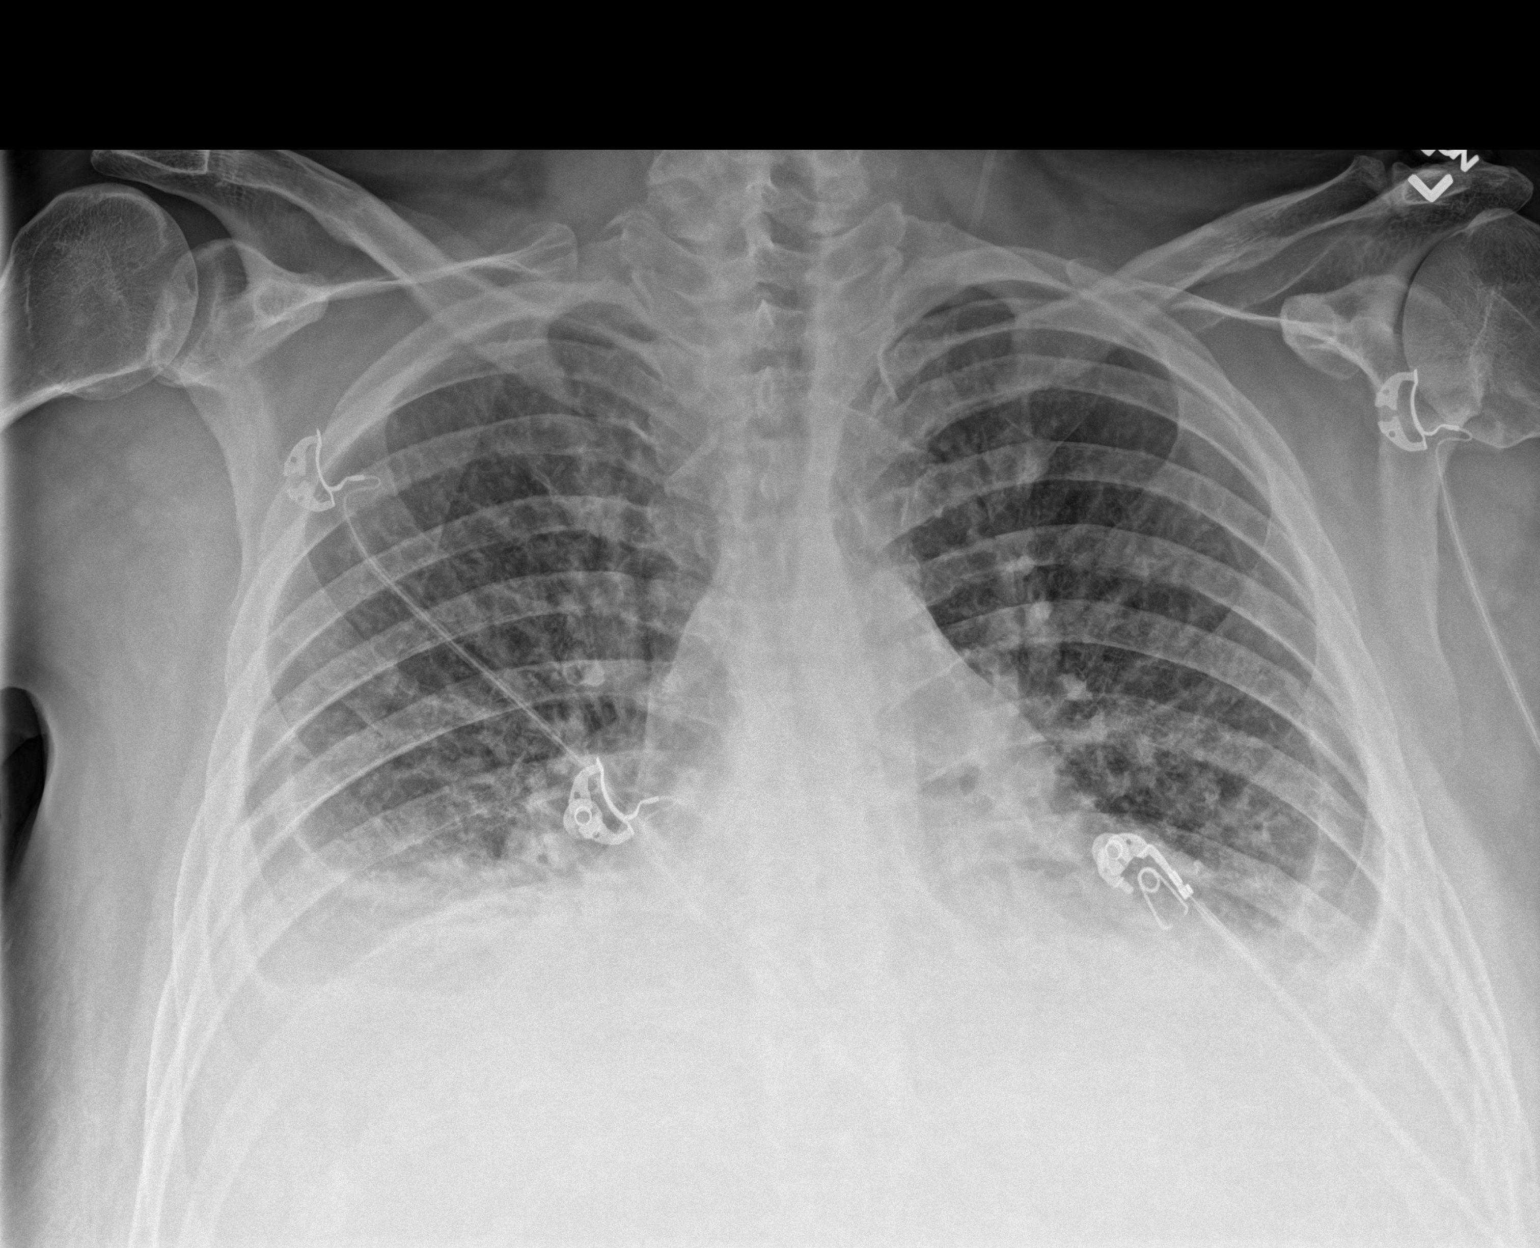

[1 of 1 positions shown; findings below may reference images not displayed]

FINDINGS: Small bilateral pleural effusions with bibasilar atelectasis or
infiltrate. No pneumothorax. Top-normal cardiac size. No acute
osseous pathology.
IMPRESSION: Small bilateral pleural effusions with bibasilar atelectasis or
infiltrate.

## 2023-12-28 IMAGING — CR DG CHEST 2V
1 series · 2 of 2 positions shown · non-contrast
Comparison: 02/05/2022

CLINICAL DATA: Pleural effusion

EXAM:
CHEST - 2 VIEW

[Series 1: dg chest 2 view · 0.14mm/px · 2 of 2 slices shown]
[im 1/2]
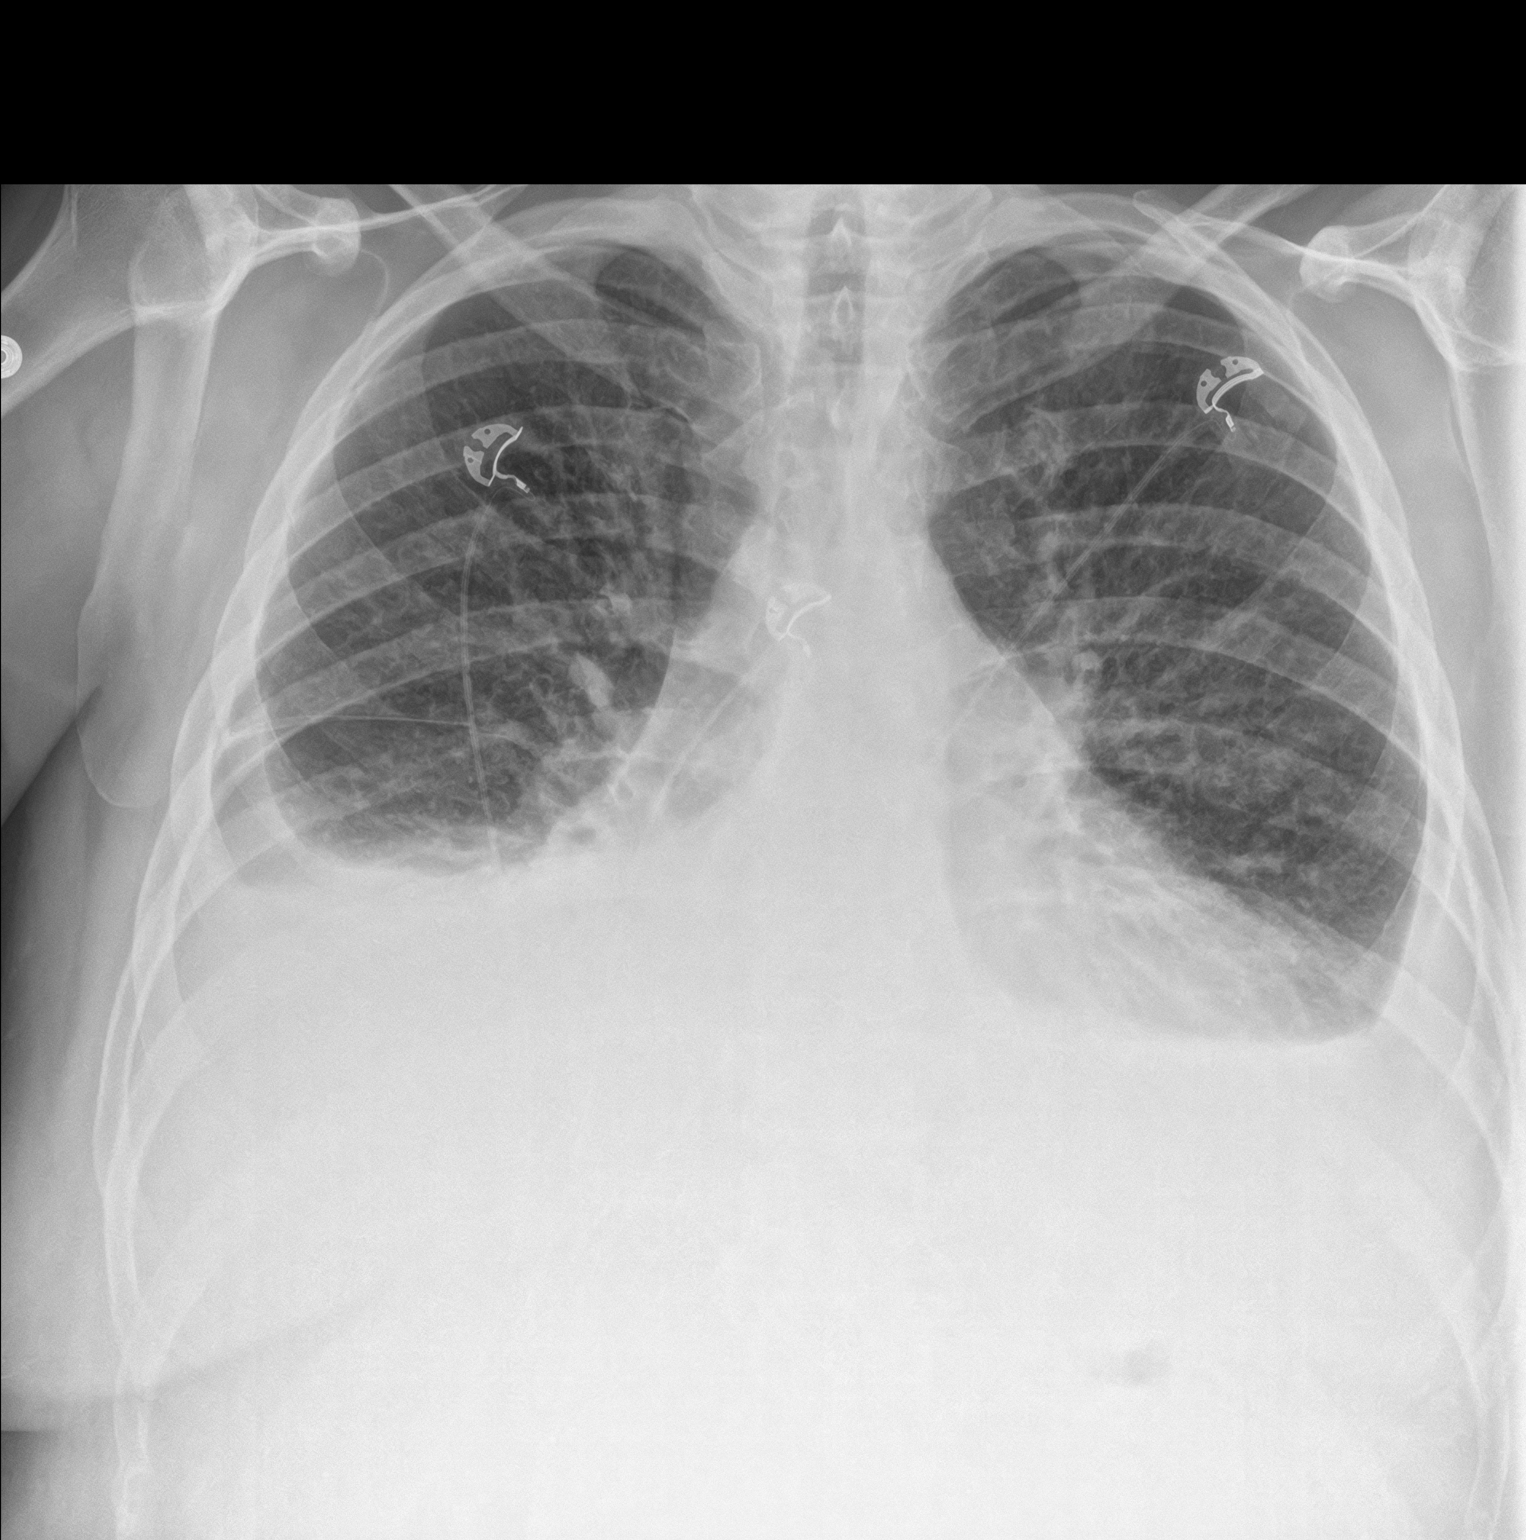
[im 2/2]
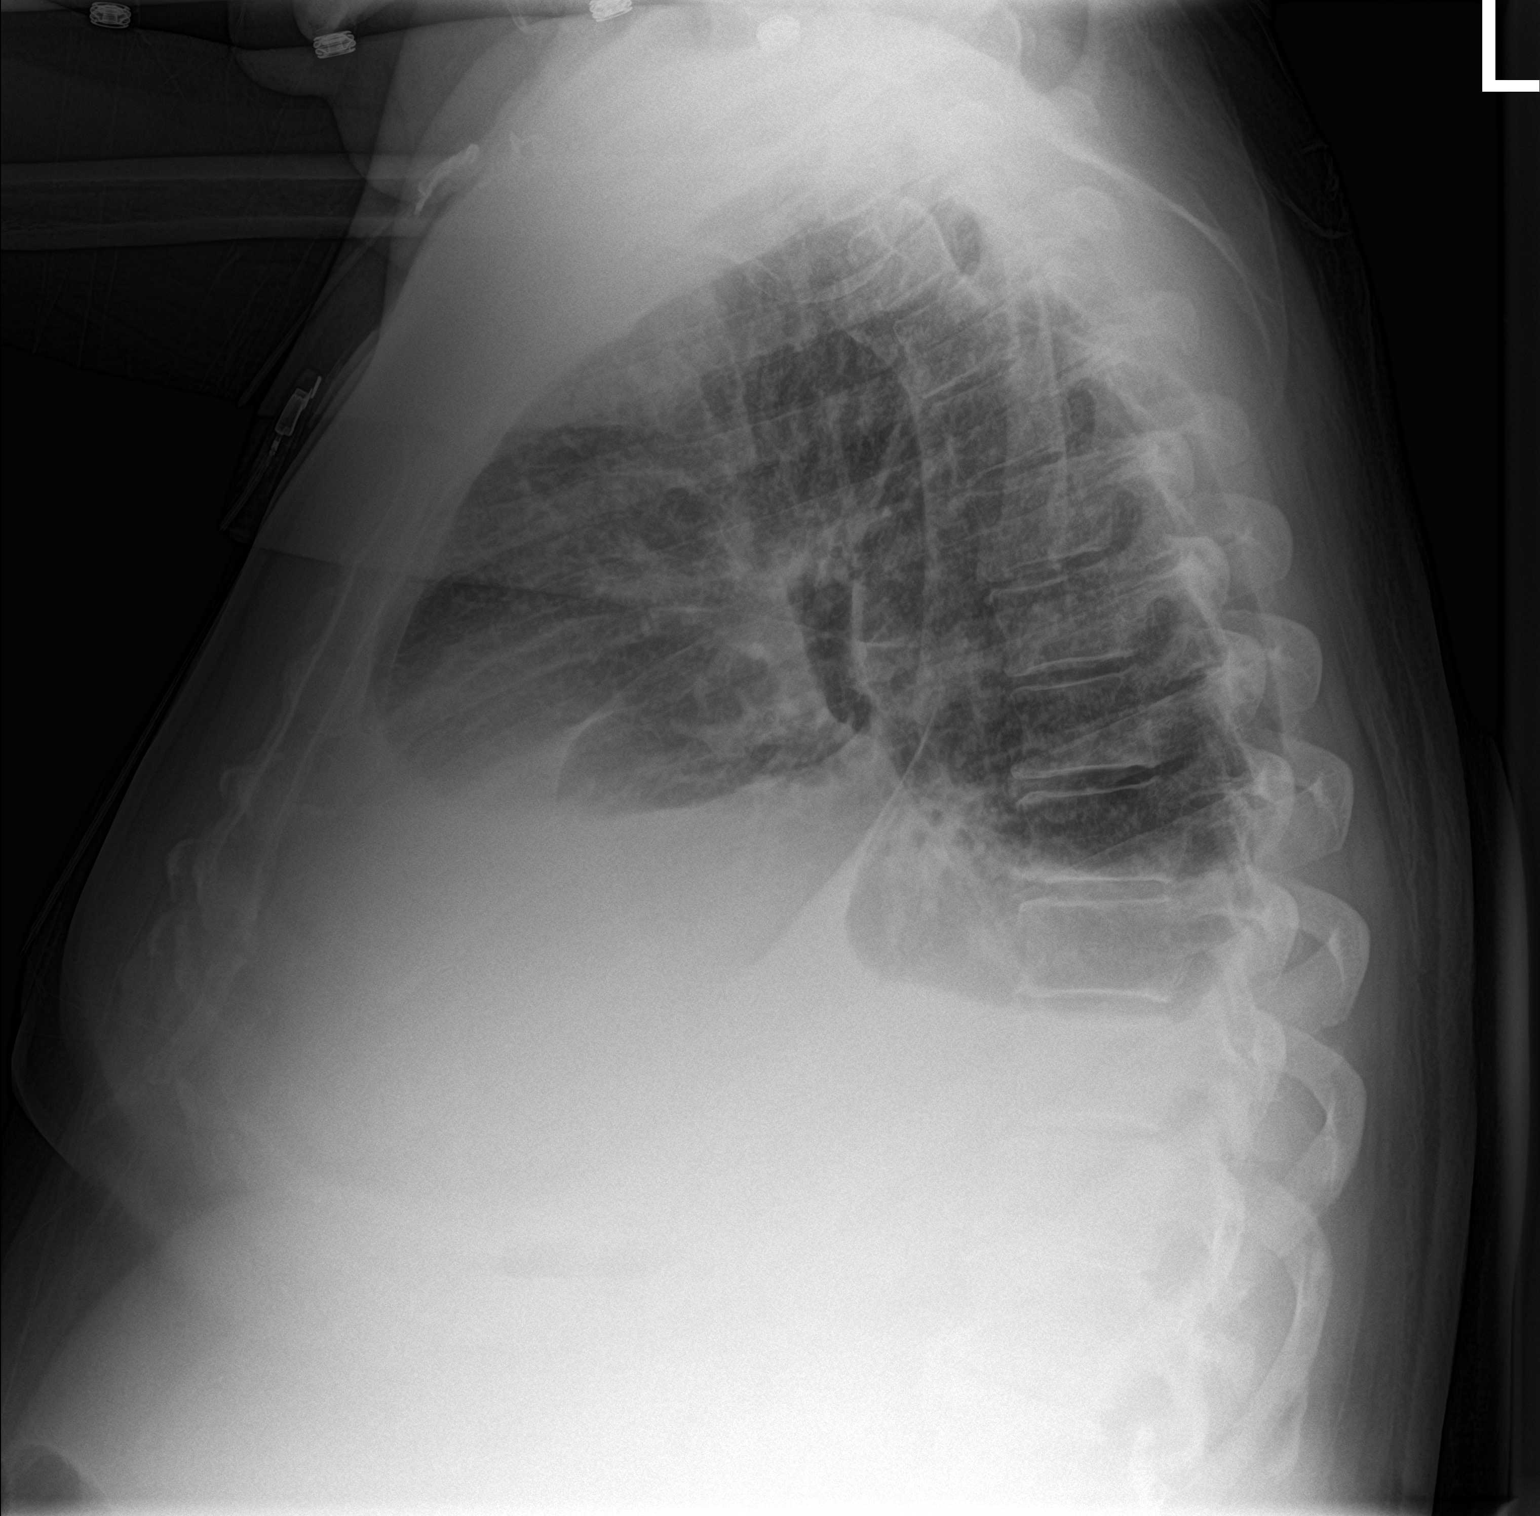

[2 of 2 positions shown; findings below may reference images not displayed]

FINDINGS: Persistent bilateral pleural effusions with adjacent atelectasis.
Cardiomediastinal contours are partially obscured.
IMPRESSION: Similar bilateral pleural effusions with adjacent atelectasis.

## 2024-04-22 ENCOUNTER — Other Ambulatory Visit (HOSPITAL_COMMUNITY): Payer: Self-pay

## 2024-04-28 ENCOUNTER — Other Ambulatory Visit (HOSPITAL_COMMUNITY): Payer: Self-pay

## 2024-07-24 NOTE — Telephone Encounter (Signed)
 error
